# Patient Record
Sex: Female | Born: 1955 | Race: White | Hispanic: No | Marital: Married | Smoking: Current every day smoker
Health system: Southern US, Academic
[De-identification: ages and names within clinical notes are randomized; demographics above are authoritative.]

## PROBLEM LIST (undated history)

## (undated) DIAGNOSIS — L0390 Cellulitis, unspecified: Secondary | ICD-10-CM

## (undated) DIAGNOSIS — I10 Essential (primary) hypertension: Secondary | ICD-10-CM

## (undated) DIAGNOSIS — I509 Heart failure, unspecified: Secondary | ICD-10-CM

## (undated) DIAGNOSIS — E119 Type 2 diabetes mellitus without complications: Secondary | ICD-10-CM

## (undated) DIAGNOSIS — F17200 Nicotine dependence, unspecified, uncomplicated: Secondary | ICD-10-CM

## (undated) DIAGNOSIS — Z9981 Dependence on supplemental oxygen: Secondary | ICD-10-CM

## (undated) DIAGNOSIS — J449 Chronic obstructive pulmonary disease, unspecified: Secondary | ICD-10-CM

## (undated) DIAGNOSIS — M159 Polyosteoarthritis, unspecified: Secondary | ICD-10-CM

## (undated) DIAGNOSIS — Z973 Presence of spectacles and contact lenses: Secondary | ICD-10-CM

## (undated) HISTORY — PX: HX CARPAL TUNNEL RELEASE: SHX101

## (undated) HISTORY — DX: Heart failure, unspecified: I50.9

## (undated) HISTORY — DX: Type 2 diabetes mellitus without complications: E11.9

## (undated) HISTORY — PX: BILATERAL CARPAL TUNNEL RELEASE: SHX6508

## (undated) HISTORY — DX: Cellulitis, unspecified: L03.90

## (undated) HISTORY — DX: Essential (primary) hypertension: I10

---

## 2012-10-08 ENCOUNTER — Encounter (HOSPITAL_BASED_OUTPATIENT_CLINIC_OR_DEPARTMENT_OTHER): Payer: Self-pay

## 2012-10-08 ENCOUNTER — Emergency Department (HOSPITAL_BASED_OUTPATIENT_CLINIC_OR_DEPARTMENT_OTHER): Payer: BLUE CROSS/BLUE SHIELD

## 2012-10-08 ENCOUNTER — Inpatient Hospital Stay (HOSPITAL_BASED_OUTPATIENT_CLINIC_OR_DEPARTMENT_OTHER)
Admission: EM | Admit: 2012-10-08 | Discharge: 2012-10-11 | DRG: 871 | Disposition: A | Discharge status: Home / Self Care | Payer: BLUE CROSS/BLUE SHIELD | Attending: Internal Medicine | Admitting: Internal Medicine

## 2012-10-08 DIAGNOSIS — R6510 Systemic inflammatory response syndrome (SIRS) of non-infectious origin without acute organ dysfunction: Secondary | ICD-10-CM | POA: Diagnosis present

## 2012-10-08 DIAGNOSIS — R0600 Dyspnea, unspecified: Secondary | ICD-10-CM | POA: Diagnosis present

## 2012-10-08 DIAGNOSIS — J441 Chronic obstructive pulmonary disease with (acute) exacerbation: Secondary | ICD-10-CM | POA: Diagnosis present

## 2012-10-08 DIAGNOSIS — Z720 Tobacco use: Secondary | ICD-10-CM | POA: Diagnosis present

## 2012-10-08 DIAGNOSIS — Z886 Allergy status to analgesic agent status: Secondary | ICD-10-CM

## 2012-10-08 DIAGNOSIS — E669 Obesity, unspecified: Secondary | ICD-10-CM | POA: Diagnosis present

## 2012-10-08 DIAGNOSIS — D72829 Elevated white blood cell count, unspecified: Secondary | ICD-10-CM | POA: Diagnosis present

## 2012-10-08 DIAGNOSIS — J9611 Chronic respiratory failure with hypoxia: Secondary | ICD-10-CM | POA: Diagnosis present

## 2012-10-08 DIAGNOSIS — Z8249 Family history of ischemic heart disease and other diseases of the circulatory system: Secondary | ICD-10-CM

## 2012-10-08 DIAGNOSIS — Z836 Family history of other diseases of the respiratory system: Secondary | ICD-10-CM

## 2012-10-08 DIAGNOSIS — E119 Type 2 diabetes mellitus without complications: Secondary | ICD-10-CM | POA: Diagnosis present

## 2012-10-08 DIAGNOSIS — F17200 Nicotine dependence, unspecified, uncomplicated: Secondary | ICD-10-CM | POA: Diagnosis present

## 2012-10-08 DIAGNOSIS — M159 Polyosteoarthritis, unspecified: Secondary | ICD-10-CM | POA: Diagnosis present

## 2012-10-08 DIAGNOSIS — Z6837 Body mass index (BMI) 37.0-37.9, adult: Secondary | ICD-10-CM

## 2012-10-08 DIAGNOSIS — I10 Essential (primary) hypertension: Secondary | ICD-10-CM | POA: Diagnosis present

## 2012-10-08 DIAGNOSIS — A419 Sepsis, unspecified organism: Principal | ICD-10-CM | POA: Diagnosis present

## 2012-10-08 DIAGNOSIS — J9620 Acute and chronic respiratory failure, unspecified whether with hypoxia or hypercapnia: Secondary | ICD-10-CM | POA: Diagnosis present

## 2012-10-08 DIAGNOSIS — Z833 Family history of diabetes mellitus: Secondary | ICD-10-CM

## 2012-10-08 DIAGNOSIS — J209 Acute bronchitis, unspecified: Secondary | ICD-10-CM | POA: Diagnosis present

## 2012-10-08 HISTORY — DX: Chronic obstructive pulmonary disease, unspecified (CMS HCC): J44.9

## 2012-10-08 HISTORY — DX: Type 2 diabetes mellitus without complications (CMS HCC): E11.9

## 2012-10-08 HISTORY — DX: Essential (primary) hypertension: I10

## 2012-10-08 HISTORY — DX: Polyosteoarthritis, unspecified: M15.9

## 2012-10-08 HISTORY — DX: Nicotine dependence, unspecified, uncomplicated: F17.200

## 2012-10-08 LAB — ARTERIAL BLOOD GAS - BMC/JMC ONLY
ARTERIAL BLOOD BASE EXCESS - BMC/JMC ONLY: 2.8 mmol/L (ref <=3.0)
ARTERIAL BLOOD O2 SATURATION - BMC/JMC ONLY: 86.2 % — ABNORMAL LOW (ref 93.0–98.0)
ARTERIAL BLOOD PCO2 - BMC/JMC ONLY: 40.3 mmHg (ref 35.0–45.0)
ARTERIAL BLOOD PH - BMC/JMC ONLY: 7.44 (ref 7.350–7.450)
ARTERIAL BLOOD PO2: 50 mmHg — CL (ref 80.0–100.0)
ARTERIAL BLOOD TOTAL CO2 - CITY ONLY: 28.4 mmol/L (ref 22.0–29.0)
RTERIAL BLOOD HCO3 - BMC/JMC ONLY: 27.2 mmol/L (ref 22.0–28.0)

## 2012-10-08 LAB — COMPREHENSIVE METABOLIC PROFILE - BMC/JMC ONLY
ALBUMIN: 4 g/dL (ref 3.2–5.0)
ALKALINE PHOSPHATASE: 90 IU/L (ref 35–120)
ALT (SGPT): 16 IU/L (ref 0–55)
AST (SGOT): 14 IU/L (ref 0–45)
BILIRUBIN, TOTAL: 0.6 mg/dL (ref 0.0–1.3)
BUN: 3 mg/dL — ABNORMAL LOW (ref 6–22)
CALCIUM: 9.3 mg/dL (ref 8.5–10.5)
CARBON DIOXIDE: 32 mmol/L (ref 22–32)
CHLORIDE: 100 mmol/L — ABNORMAL LOW (ref 101–111)
CREATININE: 0.55 mg/dL (ref 0.53–1.00)
ESTIMATED GLOMERULAR FILTRATION RATE: >60 mL/min (ref >60)
GLUCOSE: 142 mg/dL — ABNORMAL HIGH (ref 70–110)
POTASSIUM: 3.8 mmol/L (ref 3.5–5.0)
SODIUM: 141 mmol/L (ref 136–145)
TOTAL PROTEIN: 7.8 g/dL (ref 6.0–8.0)

## 2012-10-08 LAB — CBC
HCT: 52.7 % — ABNORMAL HIGH (ref 36.0–45.0)
HGB: 16.9 g/dL — ABNORMAL HIGH (ref 12.0–15.5)
MCH: 30.2 pg (ref 28.0–34.0)
MCHC: 32 g/dL — ABNORMAL LOW (ref 33.0–37.0)
MCV: 94.4 fL (ref 82.0–97.0)
MPV: 6.4 fL — ABNORMAL LOW (ref 7.0–9.4)
PLATELET COUNT: 418 K/uL — ABNORMAL HIGH (ref 150–400)
RBC: 5.59 M/uL — ABNORMAL HIGH (ref 4.00–5.10)
RDW: 13.2 % — ABNORMAL HIGH (ref 11.0–13.0)
WBC: 17.3 K/uL — ABNORMAL HIGH (ref 4.0–11.0)

## 2012-10-08 LAB — MANUAL DIFFERENTIAL - BMC/JMC ONLY
ATYPICAL LYMPHOCYTES: 2 % — ABNORMAL HIGH (ref 0–0)
BANDS: 1 % (ref 0–4)
LYMPHOCYTES: 17 % (ref 16–45)
MONOCYTES: 7 % (ref 4–10)
PLATELET ESTIMATE: ADEQUATE
PMN'S: 73 % (ref 45–74)
RBC MORPHOLOGY: NORMAL

## 2012-10-08 LAB — TROPONIN-I: TROPONIN-I: <0.03 ng/mL (ref 0.00–0.06)

## 2012-10-08 LAB — PERFORM POC FINGERSTICK GLUCOSE: BLD GLUCOSE POCT: 293 mg/dL — ABNORMAL HIGH (ref 60–100)

## 2012-10-08 MED ORDER — SITAGLIPTIN PHOSPHATE 100 MG TABLET
100.0000 mg | ORAL_TABLET | Freq: Every day | ORAL | Status: DC
Start: 2012-10-09 — End: 2012-10-11
  Administered 2012-10-09 – 2012-10-11 (×3): 100 mg via ORAL
  Filled 2012-10-08 (×3): qty 1

## 2012-10-08 MED ORDER — DOXYCYCLINE HYCLATE 100 MG INTRAVENOUS POWDER FOR SOLUTION
100.00 mg | Freq: Two times a day (BID) | INTRAVENOUS | Status: DC
Start: 2012-10-08 — End: 2012-10-09
  Administered 2012-10-08 – 2012-10-09 (×2): 100 mg via INTRAVENOUS
  Filled 2012-10-08 (×4): qty 10

## 2012-10-08 MED ORDER — HEPARIN (PORCINE) 5,000 UNIT/ML INJECTION SOLUTION
5000.0000 [IU] | Freq: Three times a day (TID) | INTRAMUSCULAR | Status: DC
Start: 2012-10-08 — End: 2012-10-11
  Filled 2012-10-08 (×9): qty 1

## 2012-10-08 MED ORDER — IPRATROPIUM BROMIDE 0.02 % SOLUTION FOR INHALATION
0.50 mg | RESPIRATORY_TRACT | Status: AC
Start: 2012-10-08 — End: 2012-10-08

## 2012-10-08 MED ORDER — ALBUTEROL SULFATE CONCENTRATE 2.5 MG/0.5 ML SOLUTION FOR NEBULIZATION
2.50 mg | INHALATION_SOLUTION | Freq: Four times a day (QID) | RESPIRATORY_TRACT | Status: DC
Start: 2012-10-08 — End: 2012-10-11
  Administered 2012-10-09 – 2012-10-11 (×12): 2.5 mg via RESPIRATORY_TRACT
  Filled 2012-10-08 (×13): qty 1

## 2012-10-08 MED ORDER — ACETAMINOPHEN 325 MG TABLET
650.0000 mg | ORAL_TABLET | Freq: Four times a day (QID) | ORAL | Status: DC | PRN
Start: 2012-10-08 — End: 2012-10-11

## 2012-10-08 MED ORDER — ALBUTEROL SULFATE CONCENTRATE 2.5 MG/0.5 ML SOLUTION FOR NEBULIZATION
2.50 mg | INHALATION_SOLUTION | RESPIRATORY_TRACT | Status: DC | PRN
Start: 2012-10-08 — End: 2012-10-11
  Administered 2012-10-08 – 2012-10-10 (×2): 2.5 mg via RESPIRATORY_TRACT
  Filled 2012-10-08 (×2): qty 1

## 2012-10-08 MED ORDER — IPRATROPIUM BROMIDE 0.02 % SOLUTION FOR INHALATION
0.50 mg | Freq: Four times a day (QID) | RESPIRATORY_TRACT | Status: DC
Start: 2012-10-08 — End: 2012-10-11
  Administered 2012-10-09 – 2012-10-11 (×12): 0.5 mg via RESPIRATORY_TRACT
  Filled 2012-10-08 (×12): qty 1

## 2012-10-08 MED ORDER — SODIUM CHLORIDE 0.9 % (FLUSH) INJECTION SYRINGE
10.0000 mL | INJECTION | Freq: Three times a day (TID) | INTRAMUSCULAR | Status: DC
Start: 2012-10-08 — End: 2012-10-11

## 2012-10-08 MED ORDER — NITROGLYCERIN 0.4 MG SUBLINGUAL TABLET
0.4000 mg | SUBLINGUAL_TABLET | SUBLINGUAL | Status: DC | PRN
Start: 2012-10-08 — End: 2012-10-11

## 2012-10-08 MED ORDER — ALBUTEROL SULFATE CONCENTRATE 2.5 MG/0.5 ML SOLUTION FOR NEBULIZATION
2.5000 mg | INHALATION_SOLUTION | RESPIRATORY_TRACT | Status: AC
Start: 2012-10-08 — End: 2012-10-08
  Filled 2012-10-08: qty 1

## 2012-10-08 MED ORDER — METHYLPREDNISOLONE SOD SUCC 125 MG SOLUTION FOR INJECTION WRAPPER
125.0000 mg | INTRAVENOUS | Status: AC
Start: 2012-10-08 — End: 2012-10-08
  Filled 2012-10-08: qty 2

## 2012-10-08 MED ORDER — FLUTICASONE-SALMETEROL 500 MCG-50 MCG/DOSE DISK DEVICE - EAST
1.00 | DISK | Freq: Two times a day (BID) | RESPIRATORY_TRACT | Status: DC
Start: 2012-10-08 — End: 2012-10-11
  Administered 2012-10-08 – 2012-10-11 (×6): 1 via RESPIRATORY_TRACT
  Filled 2012-10-08: qty 14

## 2012-10-08 MED ORDER — METHYLPREDNISOLONE SOD SUCCINATE 40 MG/ML SOLUTION FOR INJ. WRAPPER
40.00 mg | Freq: Four times a day (QID) | INTRAMUSCULAR | Status: DC
Start: 2012-10-08 — End: 2012-10-11
  Administered 2012-10-08 – 2012-10-11 (×12): 40 mg via INTRAVENOUS
  Filled 2012-10-08 (×12): qty 1

## 2012-10-08 MED ADMIN — atorvastatin 20 mg tablet: 20 mg | ORAL | NDC 68084009811

## 2012-10-08 MED ADMIN — albuterol sulfate concentrate 2.5 mg/0.5 mL solution for nebulization: 2.5 mg | RESPIRATORY_TRACT | NDC 00487990130

## 2012-10-08 MED ADMIN — acetylcysteine 200 mg/mL (20 %) solution: 5 mL | RESPIRATORY_TRACT | NDC 00409330803

## 2012-10-08 MED ADMIN — ipratropium bromide 0.02 % solution for inhalation: 0.5 mg | RESPIRATORY_TRACT | NDC 00487980101

## 2012-10-08 MED ADMIN — sodium chloride 0.9 % (flush) injection syringe: 10 mL | INTRAVENOUS | NDC 08290306546

## 2012-10-08 MED ADMIN — heparin (porcine) 5,000 unit/mL injection solution: 5000 [IU] | SUBCUTANEOUS | NDC 25021040201

## 2012-10-08 MED ADMIN — methylPREDNISolone sodium succinate 125 mg solution for injection: 125 mg | INTRAVENOUS | NDC 00009004722

## 2012-10-08 MED FILL — atorvastatin 20 mg tablet: 20.0000 mg | ORAL | Qty: 1 | Status: AC

## 2012-10-08 MED FILL — albuterol sulfate concentrate 2.5 mg/0.5 mL solution for nebulization: 2.5000 mg | RESPIRATORY_TRACT | Qty: 1 | Status: AC

## 2012-10-08 MED FILL — acetylcysteine 200 mg/mL (20 %) solution: 5.0000 mL | Qty: 30 | Status: AC

## 2012-10-08 MED FILL — ipratropium bromide 0.02 % solution for inhalation: 0.5000 mg | RESPIRATORY_TRACT | Qty: 1 | Status: AC

## 2012-10-08 NOTE — ED Nurses Note (Signed)
RT stick for second blood culture, labs sent to LAB

## 2012-10-08 NOTE — ED Nurses Note (Signed)
 Cold symptoms with yellow sputum started Friday, SOB started yesterday, normal oxygen  90 % on RA per patient, chills started today. H/a 2 days now. No n/v.

## 2012-10-08 NOTE — H&P (Signed)
[facility]  [place], [place] [address]    General History and Physical    Benjamin, Merrihew  Date of Admission:  10/08/2012  Date of Birth:  31-Jul-1955    PCP: Verline Lema, MD  Chief Complaint:        HPI: KERON KOFFMAN is a 57 y.o., White female who presents with SOB since yesterday reports cough with yellow sputum. Admits to wheeze. Admits to feeling cold. No fevers. No CP. No Nausea/vomiting. Smokes 1.5/day for 20 years. Uses Use albuterol nebs at home. Has used her meds w/o relief. Says it started out as a head cold. 2 days prior and progressively worsen.      Past Medical History   Diagnosis Date   . COPD (chronic obstructive pulmonary disease)    . Diabetes mellitus    . HTN (hypertension)    . Smoker    . Degenerative joint disease involving multiple joints        Past Surgical History   Procedure Laterality Date   . Hx carpal tunnel release         Medications Prior to Admission    Outpatient Medications    atorvastatin (LIPITOR) 20 mg Oral Tablet    Take 20 mg by mouth Every evening    ipratropium-albuterol (COMBIVENT RESPIMAT) 20-100 mcg/actuation Inhalation Aerosol    Take 1 Puff by inhalation Four times a day    Ipratropium-Albuterol (DUONEB) 0.5 mg-3 mg(2.5 mg base)/3 mL Inhalation Solution for Nebulization    3 mL by Nebulization route Once, as needed for Wheezing    levalbuterol HCl (XOPENEX) 0.31 mg/3 mL Inhalation Solution for Nebulization    0.31 mg by Nebulization route Every 4 hours as needed    sitaGLIPtin (JANUVIA) 100 mg Oral Tablet    Take 100 mg by mouth Once a day    tiotropium bromide (SPIRIVA WITH HANDIHALER) 18 mcg Inhalation Capsule, w/Inhalation Device    Take 18 mcg by inhalation Once a day          Current Facility-Administered Medications:  acetaminophen (TYLENOL) tablet 650 mg Oral Q6H PRN   acetylcysteine (MUCOMYST) 20% nebulized solution 5 mL Nebulization 3x/day   albuterol (PROVENTIL) 2.5mg / 0.5 mL nebulizer solution 2.5 mg Nebulization Q4H PRN    And      ipratropium (ATROVENT) 0.02% nebulizer solution 0.5 mg Nebulization Q4H PRN   albuterol (PROVENTIL) 2.5mg / 0.5 mL nebulizer solution 2.5 mg Nebulization 4x/day   And      ipratropium (ATROVENT) 0.02% nebulizer solution 0.5 mg Nebulization 4x/day   atorvastatin (LIPITOR) tablet 20 mg Oral QPM   doxycycline hyclate 100 mg in D5W 100 mL IVPB 100 mg Intravenous Q12H   fluticasone-salmeterol (ADVAIR) 500 mcg-50 mcg per inhalation oral inhaler 1 INHALATION Inhalation 2x/day   heparin 5,000 unit/mL injection 5,000 Units Subcutaneous Q8HRS   methylPREDNISolone sod succ (SOLU-MEDROL) 40 mg/mL injection 40 mg Intravenous Q6H   nitroglycerin (NITROSTAT) sublingual tablet 0.4 mg Sublingual Q5 Min PRN   NS flush syringe 10 mL Intravenous Q8HRS   [START ON 10/09/2012] sitaGLIPtin (JANUVIA) tablet 100 mg Oral Daily       Allergies   Allergen Reactions   . Codeine  Other Adverse Reaction (Add comment)     Sick to stomach   . Hydrocodone Nausea/ Vomiting   . Metformin Diarrhea   . Oxycodone Nausea/ Vomiting       History   Substance Use Topics   . Smoking status: Current Every Day Smoker -- 1.50  packs/day for 20 years     Types: Cigarettes   . Smokeless tobacco: Not on file   . Alcohol Use: No       Family History   Problem Relation Age of Onset   . Diabetes Mother    . COPD Mother    . Coronary Artery Disease Father    . Diabetes Sister    . Diabetes Brother    . Diabetes Paternal Grandmother        ROS:   Constitutional: negative  Eyes: negative  Ears, nose, mouth, throat, and face: negative  Respiratory: positive for cough, sputum, wheezing or dyspnea on exertion  Cardiovascular: negative  Gastrointestinal: negative  Genitourinary:negative  Integument/breast: negative  Hematologic/lymphatic: negative  Musculoskeletal:negative  Neurological: negative  Behavioral/Psych: negative  Endocrine: negative  Allergic/Immunologic: negative    10 systems are reviewed.    DNR Status:  Full Code    EXAM:   Temperature: 36.8 C (98.3 F)  Heart Rate: 116  BP (Non-Invasive): 199/84 mmHg  Respiratory Rate: 31  SpO2-1: 96 %  Pain Score (Numeric, Faces): 7  General: AAOx3. Obese. No acute distress.   Eyes: Pupils equal and round, reactive to light and accomodation.   HEENT: Head atraumatic and normocephalic   Neck: No JVD or thyromegaly or lymphadenopathy   Lungs: Crackles noted on auscultation bilaterally. Minimal expiratory wheeze. Poor air entry BL.  Cardiovascular: regular rate and rhythm, S1, S2 normal, no murmur  Abdomen: Obese. Soft, non-tender, Bowel sounds normal, No hepatosplenomegaly   Extremities: extremities normal, atraumatic, no cyanosis or edema   Skin: Skin warm and dry   Neurologic: Grossly normal   Lymphatics: No lymphadenopathy   Psychiatric: Normal affect, behavior,         Labs:    Lab Results for Last 24 Hours:    Results for orders placed during the hospital encounter of 10/08/12 (from the past 24 hour(s))   CBC       Result Value Range    WBC 17.3 (*) 4.0 - 11.0 K/uL    RBC 5.59 (*) 4.00 - 5.10 M/uL    HGB 16.9 (*) 12.0 - 15.5 g/dL    HCT 46.9 (*) 62.9 - 45.0 %    MCV 94.4  82.0 - 97.0 fL    MCH 30.2  28.0 - 34.0 pg    MCHC 32.0 (*) 33.0 - 37.0 g/dL    RDW 52.8 (*) 41.3 - 13.0 %    PLATELET COUNT 418 (*) 150 - 400 K/uL    MPV 6.4 (*) 7.0 - 9.4 fL   COMPREHENSIVE METABOLIC PROFILE - [facility] ONLY       Result Value Range    GLUCOSE 142 (*) 70 - 110 mg/dL    BUN 3 (*) 6 - 22 mg/dL    CREATININE 2.44  0.10 - 1.00 mg/dL    ESTIMATED GLOMERULAR FILTRATION RATE >60  >60 ml/min    SODIUM 141  136 - 145 mmol/L    POTASSIUM 3.8  3.5 - 5.0 mmol/L    CHLORIDE 100 (*) 101 - 111 mmol/L    CARBON DIOXIDE 32  22 - 32 mmol/L    CALCIUM 9.3  8.5 - 10.5 mg/dL    TOTAL PROTEIN 7.8  6.0 - 8.0 g/dL    ALBUMIN 4.0  3.2 - 5.0 g/dL    BILIRUBIN, TOTAL 0.6  0.0 - 1.3 mg/dL    AST (SGOT) 14  0 - 45 IU/L    ALT (SGPT) 16  0 - 55 IU/L    ALKALINE PHOSPHATASE 90  35 - 120 IU/L   TROPONIN-I       Result Value Range     TROPONIN-I <0.03  0.00 - 0.06 ng/mL   MANUAL DIFFERENTIAL - [facility] ONLY       Result Value Range    BANDS 1  0 - 4 %    PMN'S 73  45 - 74 %    LYMPHOCYTES 17  16 - 45 %    AYTPICAL LYMPHOCYTES 2 (*) 0 - 0 %    MONOCYTES 7  4 - 10 %    PLATELET ESTIMATE ADEQUATE  ADEQUATE    RBC MORPHOLOGY NORMAL  NORMAL   ARTERIAL BLOOD GAS - [facility] ONLY       Result Value Range    ARTERIAL BLOOD PH - [facility] ONLY 7.44  7.350 - 7.450    ARTERIAL BLOOD PCO2 - [facility] ONLY 40.3  35.0 - 45.0 mmHg    ARTERIAL BLOOD PO2 - [facility] ONLY 50.0 (*) 80.0 - 100.0 mmHg    RTERIAL BLOOD HCO3 - [facility] ONLY 27.2  22.0 - 28.0 mmol/L    ARTERIAL BLOOD TOTAL CO2 - [facility] ONLY 28.4  22.0 - 29.0 mmol/L    ARTERIAL BLOOD BASE EXCESS - [facility] ONLY 2.8  -2.0 - 3.0 mmol/L    ARTERIAL BLOOD O2 SATURATION - [facility] ONLY 86.2 (*) 93.0 - 98.0 %    SAMPLE TYPE - [facility] ONLY Arterial  ()    DRAW SITE - [facility] ONLY R Brach  ()    ALLEN'S TEST - [facility] ONLY N/A  ()    DELIVERY SYSTEM - [facility] ONLY Room Air  ()    CRITICAL NOTIFY - [facility] ONLY DR. GLASS  ()    READ BACK - [facility] ONLY Yes  ()    OTIFY DATE - [facility] ONLY 29Sep2014 18:53  ()       Imaging Studies:    CXR  ? R sided infiltrate. Barrel chest anatomy    DVT RISK FACTORS HAVE BEN ASSESSED AND PROPHYLAXIS ORDERED (SEE RUBYONLINE - REFERENCE TOOLS - MD, DVT PROPHY OR POCKET CARD)  Patient Active Problem List    Diagnosis Date Noted   . Dyspnea 10/08/2012   . COPD exacerbation 10/08/2012   . Obesity (BMI 35.0-39.9 without comorbidity) 10/08/2012     Assessment/Plan:       Dyspnea: Acute. Likely due to COPD exa.  PNA needs r/o given elevated WBC. Will start on Doxycycline. Bronchodilators. Solumedrol . Will get sputum culture. Procalcitonin. Will repeat CXR in AM.      COPD exa: Moderate. Treat as outlined above. Oxygen. Monitor. Advised smoking cessation.      Tobacco abuse: Chronic. Talked about abstinence and medication available to help her. Declined meds for now. Spent 12 minutes on talk.     PNA:? Unlikely but elevated WBC is concerning. No real clear infiltrate on CXR. Procalcitonin. Sputum culture. Repeat CXR in M. Monitor.      Obesity: Class II. Needs weight loss. May benefit from a sleep study as outpt.      Proph: PPI. Heparin.      Dispo: Home in 2-3 days.      Code: FC (Pt has capacity).

## 2012-10-08 NOTE — ED Provider Notes (Signed)
 [name]  Emergency Department Visit Note    Date:  10/08/2012  Primary care provider:  Delon Ken, MD  Means of arrival:  private car  History obtained from: patient  History limited by: none    Chief Complaint:  Shortness of breath    HISTORY OF PRESENT ILLNESS     [name], date of birth 1955-06-24, is a 57 y.o. female with a history of COPD who presents to the Emergency Department complaining of shortness of breath that began yesterday and worsened today. The patient reports that she had cold-like symptoms three days ago, including sore throat and sinus congestion, but this has since resolved. The patient states that she used a nebulizer treatment which helped briefly. Patient confirms cough (producing yellow phlegm) and denies hemoptysis, fever, and any pain currently.     Additionally, she states that she has never been admitted and has never needed to be intubated.     REVIEW OF SYSTEMS     The pertinent positive and negative symptoms are as per HPI. All other systems reviewed and are negative.     PATIENT HISTORY     Past Medical History:  Past Medical History   Diagnosis Date   . COPD (chronic obstructive pulmonary disease)    . Diabetes mellitus    . HTN (hypertension)      Past Surgical History:  History reviewed. No pertinent past surgical history.    Social History:  History   Substance Use Topics   . Smoking status: Current Every Day Smoker -- 1.50 packs/day     Types: Cigarettes   . Alcohol Use: No     History   Drug Use No     Medications:  Previous Medications    No medications on file     Allergies:  Allergies   Allergen Reactions   . Codeine  Other Adverse Reaction (Add comment)     Sick to stomach   . Hydrocodone Nausea/ Vomiting   . Metformin Diarrhea   . Oxycodone  Nausea/ Vomiting     PHYSICAL EXAM     Vitals:  Filed Vitals:    10/08/12 1820   BP: 150/91   Pulse: 110   Temp: 36.8 C (98.3 F)   Resp: 24   SpO2: 85%     Pulse ox  93% on Non Rebreather Mask  interpreted by me as: Improved after treatment    General: Awake. Alert. No acute distress.  Head:  Atraumatic     Eyes: Anicteric sclera, noninjected conjunctiva.  PERRL. EOMI.  ENT: Oropharynx patent, pink, moist, no erythema, exudates or asymmetry. No stridor.   Neck: Neck is supple, nontender, no adenopathy. No nuchal rigidity. No JVD.  Lungs: Clear to auscultation bilaterally. Bilateral expiratory wheezes. No rales or rhonchi.  Mild respiratory distress.  Cardiovascular:  Heart is regular without murmurs rubs or gallops   Abdomen:  Soft, nontender, normoactive bowel sounds. No peritoneal signs. No pulsatile mass.  Extremities:  No cyanosis, edema, tenderness or asymmetry.  Skin: Skin warm, dry and well-perfused. No petechia or erythema.   Back:  Non-tender to palpation in the midline.    Neurologic: Awake, alert, no lethargy, somnolence or confusion. Moves upper and lower extremities without focal deficits.   Psychiatric: Answers questions appropriately.    DIAGNOSTIC STUDIES     Labs:    Results for orders placed during the hospital encounter of 10/08/12   CBC  Result Value Range    WBC 17.3 (*) 4.0 - 11.0 K/uL    RBC 5.59 (*) 4.00 - 5.10 M/uL    HGB 16.9 (*) 12.0 - 15.5 g/dL    HCT 47.2 (*) 63.9 - 45.0 %    MCV 94.4  82.0 - 97.0 fL    MCH 30.2  28.0 - 34.0 pg    MCHC 32.0 (*) 33.0 - 37.0 g/dL    RDW 86.7 (*) 88.9 - 13.0 %    PLATELET COUNT 418 (*) 150 - 400 K/uL    MPV 6.4 (*) 7.0 - 9.4 fL    PMN % 72.4  43.0 - 76.0 %    LYMPHOCYTE % 17.2  15.0 - 43.0 %    MONOCYTE % 8.4  4.8 - 12.0 %    EOSINOPHIL % 1.1  0.0 - 5.2 %    BASOPHILS % 1.0  0.0 - 1.4 %    PMN # 12.50 (*) 1.50 - 6.50 K/uL    LYMPHOCYTE # 2.97  0.70 - 3.20 K/uL    MONOCYTE # 1.44 (*) 0.20 - 0.90 K/uL    EOSINOPHIL # 0.19  0.00 - 0.50 K/uL    BASOPHIL # 0.18 (*) 0.00 - 0.10 K/uL   COMPREHENSIVE METABOLIC PROFILE - [facility] ONLY       Result Value Range    GLUCOSE 142 (*) 70 - 110 mg/dL    BUN 3 (*) 6 - 22 mg/dL    CREATININE 9.44  9.46 - 1.00  mg/dL    ESTIMATED GLOMERULAR FILTRATION RATE >60  >60 ml/min    SODIUM 141  136 - 145 mmol/L    POTASSIUM 3.8  3.5 - 5.0 mmol/L    CHLORIDE 100 (*) 101 - 111 mmol/L    CARBON DIOXIDE 32  22 - 32 mmol/L    CALCIUM 9.3  8.5 - 10.5 mg/dL    TOTAL PROTEIN 7.8  6.0 - 8.0 g/dL    ALBUMIN 4.0  3.2 - 5.0 g/dL    BILIRUBIN, TOTAL 0.6  0.0 - 1.3 mg/dL    AST (SGOT) 14  0 - 45 IU/L    ALT (SGPT) 16  0 - 55 IU/L    ALKALINE PHOSPHATASE 90  35 - 120 IU/L   TROPONIN-I       Result Value Range    TROPONIN-I <0.03  0.00 - 0.06 ng/mL   ARTERIAL BLOOD GAS - [facility] ONLY       Result Value Range    ARTERIAL BLOOD PH - [facility] ONLY 7.44  7.350 - 7.450    ARTERIAL BLOOD PCO2 - [facility] ONLY 40.3  35.0 - 45.0 mmHg    ARTERIAL BLOOD PO2 - [facility] ONLY 50.0 (*) 80.0 - 100.0 mmHg    RTERIAL BLOOD HCO3 - [facility] ONLY 27.2  22.0 - 28.0 mmol/L    ARTERIAL BLOOD TOTAL CO2 - [facility] ONLY 28.4  22.0 - 29.0 mmol/L    ARTERIAL BLOOD BASE EXCESS - [facility] ONLY 2.8  -2.0 - 3.0 mmol/L    ARTERIAL BLOOD O2 SATURATION - [facility] ONLY 86.2 (*) 93.0 - 98.0 %    SAMPLE TYPE - [facility] ONLY Arterial  ()    DRAW SITE - [facility] ONLY R Brach  ()    ALLEN'S TEST - [facility] ONLY N/A  ()    DELIVERY SYSTEM - [facility] ONLY Room Air  ()    CRITICAL NOTIFY - [facility] ONLY DR. Gabryelle Whitmoyer  ()    READ BACK - [facility]  ONLY Yes  ()    OTIFY DATE - [facility] ONLY 29Sep2014 18:53  ()     Labs reviewed and interpreted by me.    Radiology:    XR CHEST AP PORTABLE - No acute findings. COPD. Radiological imaging interpreted by radiologist and independently reviewed by me.    EKG:  12 lead EKG interpreted by me shows sinus tachycardia, rate of 113 bpm, no acute ischemic changes.     ED PROGRESS NOTE / MEDICAL DECISION MAKING     Orders Placed This Encounter   . BLOOD CULTURE - [facility] ONLY   . XR CHEST AP PORTABLE   . ARTERIAL BLOOD GAS - [facility] ONLY   . CBC   . COMPREHENSIVE METABOLIC PROFILE - [facility] ONLY   . TROPONIN-I   . ARTERIAL BLOOD GAS - [facility] ONLY   . RESPIRATORY THERAPY TO DO ARTERIAL PUNCTURE FOR LAB   . ECG 12-LEAD   . INSERT &  MAINTAIN PERIPHERAL IV ACCESS   . albuterol  (PROVENTIL ) 2.5mg / 0.5 mL nebulizer solution    And   . ipratropium (ATROVENT ) 0.02% nebulizer solution   . albuterol  (PROVENTIL ) 2.5mg / 0.5 mL nebulizer solution   . methylPREDNISolone  sod succ (SOLU-MEDROL ) 125 mg/2 mL injection     Patient was initially treated with Proventil  and Atrovent  via nebulizer and IV Solu-Medrol .  Portable chest x-ray, EKG, blood culture, and the listed laboratory studies were ordered.    7:42 PM I discussed the patient's case and above findings with Dr. Darcy [facility]) who has agreed to observe the patient in the hospital for further evaluation and treatment of her symptoms.     7:49 PM - On recheck, I have explained the results of the diagnostic studies.  I have discussed the diagnoses and disposition with the patient. She understood and is in accordance with the treatment plan at this time. All of her questions have been answered to her satisfaction. The patient is in fair condition that is improved at the time of hospitalization.     Pre-Disposition Vitals:  Filed Vitals:    10/08/12 1858 10/08/12 1900 10/08/12 1915 10/08/12 1930   BP:  189/79 184/78 184/78   Pulse:  110 115 110   Temp:       Resp:  28 22 32   SpO2: 96% 96% 100% 100%     CLINICAL IMPRESSION     1. Acute COPD exacerbation  2. Hypoxia    Blood culture pending at the time of admission.    DISPOSITION/PLAN     Admitted        Condition at Disposition: Fair  - Improved    SCRIBE ATTESTATION STATEMENT  I Charlie Eagles, SCRIBE scribed for Melinda Locus, DO on 10/08/2012 at 6:19 PM.     Documentation assistance provided for Melinda Locus, DO  by Charlie Eagles, SCRIBE. Information recorded by the scribe was done at my direction and has been reviewed and validated by me Melinda Locus, DO.

## 2012-10-08 NOTE — Respiratory Therapy (Signed)
Patient placed on 50% Venti mask.    Elza Rafter, RT

## 2012-10-08 NOTE — ED Nurses Note (Signed)
Called and gave report to 6th floor RN.

## 2012-10-08 NOTE — ED Nurses Note (Signed)
EKG done and shown to Dr. Georgina Quint. Dr. Tamsen Snider at bedside evaluating for admission.

## 2012-10-08 NOTE — Respiratory Therapy (Signed)
Second albuterol neb completed.     Elza Rafter, RT

## 2012-10-09 LAB — HGA1C (HEMOGLOBIN A1C WITH EST AVG GLUCOSE)
ESTIMATED AVERAGE GLUCOSE: 177 mg/dL — ABNORMAL HIGH (ref 70–110)
GLYCOHEMOGLOBIN: 7.8 % — ABNORMAL HIGH (ref 4.0–6.0)

## 2012-10-09 LAB — PERFORM POC FINGERSTICK GLUCOSE
BLD GLUCOSE POCT: 292 mg/dL — ABNORMAL HIGH (ref 60–100)
BLD GLUCOSE POCT: 468 mg/dL (ref 60–100)
BLD GLUCOSE POCT: 347 mg/dL — ABNORMAL HIGH (ref 60–100)
BLD GLUCOSE POCT: 352 mg/dL — ABNORMAL HIGH (ref 60–100)

## 2012-10-09 MED ORDER — SODIUM CHLORIDE 0.9 % INTRAVENOUS SOLUTION
1.00 g | INTRAVENOUS | Status: DC
Start: 2012-10-09 — End: 2012-10-11
  Administered 2012-10-09 – 2012-10-11 (×3): 1 g via INTRAVENOUS
  Filled 2012-10-09 (×4): qty 10

## 2012-10-09 MED ADMIN — heparin (porcine) 5,000 unit/mL injection solution: 5000 [IU] | SUBCUTANEOUS | NDC 25021040201

## 2012-10-09 MED ADMIN — azithromycin 250 mg tablet: 500 mg | ORAL | NDC 68084065611

## 2012-10-09 MED ADMIN — acetylcysteine 200 mg/mL (20 %) solution: 5 mL | RESPIRATORY_TRACT | NDC 00409330803

## 2012-10-09 MED ADMIN — sodium chloride 0.9 % (flush) injection syringe: 10 mL | INTRAVENOUS | NDC 08290306546

## 2012-10-09 MED ADMIN — atorvastatin 20 mg tablet: 20 mg | ORAL | NDC 68084009811

## 2012-10-09 MED ADMIN — acetylcysteine 200 mg/mL (20 %) solution: 0 mL | RESPIRATORY_TRACT

## 2012-10-09 MED ADMIN — sodium chloride 0.9 % (flush) injection syringe: 0 mL | INTRAVENOUS

## 2012-10-09 MED FILL — azithromycin 250 mg tablet: 500.0000 mg | ORAL | Qty: 2 | Status: AC

## 2012-10-09 NOTE — Care Plan (Signed)
Problem: General Plan of Care(Adult,OB)  Goal: Plan of Care Review(Adult,OB)  The patient and/or their representative will communicate an understanding of their plan of care   Outcome: Adequate for Discharge Date Met:  10/09/12  Assessment Patient is demonstrating a level of mobility consistent with the patient's normal level of function prior to admission. No skilled PT needs identified during evaluation this date. Will DC PT. Pt should be able to return home when cleared by MD.       Goals:    Deferred as pt has no skilled PT needs    Patient and/orFamily Goals/Expectations: "To be able to go home soon and without oxygen"    Plan:       Pt seen for eval only, no PT needs. Will DC PT.     PT Recommendations for Nursing: encourage ambulation  PT Recommendations for Patient/Family: continue ambulating to maintain strength/mobility    Additional information:     Anticipated rehab needs at discharge:  None     Patient/family has given verbal consent for eval and treatment:  Yes    Is the patient appropriate for skilled PT at this time? no     Posey Pronto, PT

## 2012-10-09 NOTE — Care Management Notes (Signed)
MRS. Chandran'S HOME NEB IS THRU A MAIL ORDER COMPANY. ALBUTEROL THRU THEM AS WELL. PT. AND HUSBAND RECENTLY MOVED HERE FROM [place]. PCP IS [facility] IN [place]. HUSBAND HOLDS THE INS. COVERAGE. GRANDDAUGHTER WAS VISITING AT THE BEDSIDE. PT. UNDERSTANDS IF HOME O2 NECESSARY, CM WILL MEET WITH HER AGAIN.

## 2012-10-09 NOTE — Care Plan (Signed)
Problem: General Plan of Care(Adult,OB)  Goal: Plan of Care Review(Adult,OB)  The patient and/or their representative will communicate an understanding of their plan of care   Outcome: Ongoing (see interventions/notes)  Patients respirations are unlabored is on Venti Mask, now decreased to 40% sating 91-92% with no complaints.  Lungs exp and Ins wheezes throughout. Voids clear yellow urine without difficulty. Pt is on telemetry # 11 Sinus Tach 120's     Abscent of falls/traums/ or injurys, no hospital problems displayed at this time. Will continue to monitor patient.

## 2012-10-09 NOTE — CD[facility] REVIEW (Signed)
 [facility] - Initial Review     Working DRG 1:  190  Dx:  COPD Exac [id number], Dyspnea 21390, Obesity 27800, Tobacco 3051, PNA 486, DM 25000, DJD 71500

## 2012-10-09 NOTE — Nurses Notes (Signed)
Hospitalist paged x2 regarding elevated blood sugars. Waiting for return call. Operator states it may take awhile for doctor to return call.

## 2012-10-09 NOTE — Care Management Notes (Signed)
 10/09/12 1658   Assessment Detail   Assessment Type Admission   Date of Care Management Update 10/10/12   Care Management  Plan   Discharge Planning Status plan in progress   Anticipated Discharge Disposition home   Discharge Needs Assessment   Concerns to be Addressed no discharge needs identified  (possibly will need home o2)   Equipment Needed After Discharge other (see comments)  (o2 possibly)   Referral Information   Admission Type inpatient   Arrived From emergency department   Record Reviewed medical record   Current Health   Services Anticipated at Discharge (as above)

## 2012-10-09 NOTE — Progress Notes (Signed)
 [facility]  [place], [place] [address]    IP PROGRESS NOTE      [name], [name]  Date of Admission:  10/08/2012  Date of Birth:  11-07-1955  Date of Service:  10/09/2012    Chief Complaint: Patient was admitted with COPD exacerbation and profound hypoxia.    Subjective: ROS: Patient is off of non rebreather mask. On O2 by nasal cannula. She is telling me that she does not want to be in the hospital. Denies any CP.     Vital Signs:  Temp (24hrs) Max:37.1 C (98.7 F)      Temperature: 36.7 C (98.1 F)  BP (Non-Invasive): 157/81 mmHg  Heart Rate: 102  Respiratory Rate: 22  Pain Score (Numeric, Faces): 0  SpO2-1: 95 %    Current Medications:    Current Facility-Administered Medications:  acetaminophen  (TYLENOL ) tablet 650 mg Oral Q6H PRN   acetylcysteine  (MUCOMYST ) 20% nebulized solution 5 mL Nebulization 3x/day   albuterol  (PROVENTIL ) 2.5mg / 0.5 mL nebulizer solution 2.5 mg Nebulization Q4H PRN   And      ipratropium (ATROVENT ) 0.02% nebulizer solution 0.5 mg Nebulization Q4H PRN   albuterol  (PROVENTIL ) 2.5mg / 0.5 mL nebulizer solution 2.5 mg Nebulization 4x/day   And      ipratropium (ATROVENT ) 0.02% nebulizer solution 0.5 mg Nebulization 4x/day   atorvastatin  (LIPITOR) tablet 20 mg Oral QPM   azithromycin  (ZITHROMAX ) tablet 500 mg Oral Q24H   cefTRIAXone  (ROCEPHIN ) 1 g in NS 50 mL IVPB 1 g Intravenous Q24H   fluticasone -salmeterol (ADVAIR ) 500 mcg-50 mcg per inhalation oral inhaler 1 INHALATION Inhalation 2x/day   heparin  5,000 unit/mL injection 5,000 Units Subcutaneous Q8HRS   methylPREDNISolone  sod succ (SOLU-MEDROL ) 40 mg/mL injection 40 mg Intravenous Q6H   nitroglycerin  (NITROSTAT ) sublingual tablet 0.4 mg Sublingual Q5 Min PRN   NS flush syringe 10 mL Intravenous Q8HRS   sitaGLIPtin  (JANUVIA ) tablet 100 mg Oral Daily       Today's Physical Exam:  GENERAL: Patient is alert, awake and oriented X 3. In no cardio respiratory distress. She is cyanotic and is short of breath in moderate  distress. She is moderately obese.   HEENT: PERRLA, EOMI. She has poor dentition.   NECK: Supple, NO JVD.   HEART: S1+, S2+, rate controlled and rhythm regular. No murmurs appreciated.   LUNGS: Bilateral breath sounds coarse with expiratory wheezing.   ABDOMEN: Soft, NT, ND with NABS.   EXTREMITIES: No edema, pedal pulses palpable.   NEURO:  Motor exam is non focal.   SKIN: No rashes or bruises.   PSYCH: Pleasant with no signs of depression.      I/O:  I/O last 24 hours:    Intake/Output Summary (Last 24 hours) at 10/09/12 1836  Last data filed at 10/09/12 1440   Gross per 24 hour   Intake   1000 ml   Output   3000 ml   Net  -2000 ml     I/O current shift:       Nutrition/Residuals:       Labs  -  reviewed  Reviewed:   Lab Results for Last 24 Hours:    Results for orders placed during the hospital encounter of 10/08/12 (from the past 24 hour(s))   CBC       Result Value Range    WBC 17.3 (*) 4.0 - 11.0 K/uL    RBC 5.59 (*) 4.00 - 5.10 M/uL    HGB 16.9 (*) 12.0 - 15.5 g/dL  HCT 52.7 (*) 36.0 - 45.0 %    MCV 94.4  82.0 - 97.0 fL    MCH 30.2  28.0 - 34.0 pg    MCHC 32.0 (*) 33.0 - 37.0 g/dL    RDW 86.7 (*) 88.9 - 13.0 %    PLATELET COUNT 418 (*) 150 - 400 K/uL    MPV 6.4 (*) 7.0 - 9.4 fL   COMPREHENSIVE METABOLIC PROFILE - [facility] ONLY       Result Value Range    GLUCOSE 142 (*) 70 - 110 mg/dL    BUN 3 (*) 6 - 22 mg/dL    CREATININE 9.44  9.46 - 1.00 mg/dL    ESTIMATED GLOMERULAR FILTRATION RATE >60  >60 ml/min    SODIUM 141  136 - 145 mmol/L    POTASSIUM 3.8  3.5 - 5.0 mmol/L    CHLORIDE 100 (*) 101 - 111 mmol/L    CARBON DIOXIDE 32  22 - 32 mmol/L    CALCIUM 9.3  8.5 - 10.5 mg/dL    TOTAL PROTEIN 7.8  6.0 - 8.0 g/dL    ALBUMIN 4.0  3.2 - 5.0 g/dL    BILIRUBIN, TOTAL 0.6  0.0 - 1.3 mg/dL    AST (SGOT) 14  0 - 45 IU/L    ALT (SGPT) 16  0 - 55 IU/L    ALKALINE PHOSPHATASE 90  35 - 120 IU/L   TROPONIN-I       Result Value Range    TROPONIN-I <0.03  0.00 - 0.06 ng/mL   MANUAL DIFFERENTIAL - [facility] ONLY       Result  Value Range    BANDS 1  0 - 4 %    PMN'S 73  45 - 74 %    LYMPHOCYTES 17  16 - 45 %    AYTPICAL LYMPHOCYTES 2 (*) 0 - 0 %    MONOCYTES 7  4 - 10 %    PLATELET ESTIMATE ADEQUATE  ADEQUATE    RBC MORPHOLOGY NORMAL  NORMAL   BLOOD CULTURE - [facility] ONLY       Result Value Range    BLOOD CULTURE NO GROWTH AFTER 16 HOURS INCUBATION.     HGA1C (HEMOGLOBIN A1C WITH EST AVG GLUCOSE)       Result Value Range    GLYCOHEMOGLOBIN 7.8 (*) 4.0 - 6.0 %    ESTIMATED AVERAGE GLUCOSE 177 (*) 70 - 110 mg/dL   ARTERIAL BLOOD GAS - [facility] ONLY       Result Value Range    ARTERIAL BLOOD PH - [facility] ONLY 7.44  7.350 - 7.450    ARTERIAL BLOOD PCO2 - [facility] ONLY 40.3  35.0 - 45.0 mmHg    ARTERIAL BLOOD PO2 - [facility] ONLY 50.0 (*) 80.0 - 100.0 mmHg    RTERIAL BLOOD HCO3 - [facility] ONLY 27.2  22.0 - 28.0 mmol/L    ARTERIAL BLOOD TOTAL CO2 - [facility] ONLY 28.4  22.0 - 29.0 mmol/L    ARTERIAL BLOOD BASE EXCESS - [facility] ONLY 2.8  -2.0 - 3.0 mmol/L    ARTERIAL BLOOD O2 SATURATION - [facility] ONLY 86.2 (*) 93.0 - 98.0 %    SAMPLE TYPE - [facility] ONLY Arterial  ()    DRAW SITE - [facility] ONLY R Brach  ()    ALLEN'S TEST - [facility] ONLY N/A  ()    DELIVERY SYSTEM - [facility] ONLY Room Air  ()    CRITICAL NOTIFY - [facility] ONLY DR. GLASS  ()  READ BACK - [facility] ONLY Yes  ()    OTIFY DATE - [facility] ONLY 29Sep2014 18:53  ()   POCT FINGERSTICK GLUCOSE       Result Value Range    BLD GLUCOSE POCT 293 (*) 60 - 100 mg/dL   POCT FINGERSTICK GLUCOSE       Result Value Range    BLD GLUCOSE POCT 292 (*) 60 - 100 mg/dL   POCT FINGERSTICK GLUCOSE       Result Value Range    BLD GLUCOSE POCT 468 (*) 60 - 100 mg/dL   POCT FINGERSTICK GLUCOSE       Result Value Range    BLD GLUCOSE POCT 347 (*) 60 - 100 mg/dL     Ordered:      Diagnostic Tests - reviewed    Radiology Tests - reviewed    Problem List:  Active Hospital Problems   (*Primary Problem)    Diagnosis   . *Dyspnea   . COPD exacerbation   . Obesity (BMI 35.0-39.9 without comorbidity)       Assessment/ Plan:     Acute respiratory failure with hypoxic  respiratory failure - on Non rebreather mask initially - wean to O2 by nasal cannula.  COPD exacerbation / acute -  Pneumonia needs to be ruled out as she is having elevated WBC. Change IV antibiotics to Ceftriaxone  and Zithromax . Continue Bronchodilator nebs and IV Solumedrol.  Obtain sputum culture.   Long standing Tobacco abuse - D/W her at length regarding smoking cessation, Nicotine  patch offered.   Leucocytosis with SIRS / Sepsis - obtain Sputum culture. Monitor clinically.   Obesity with a BMI of 37 - advised her to lose weight - May benefit from a sleep study as outpatient.   PLAN:  On Heparin  for DVT prophylaxis.  Follow serial labs.  Advised patient to stay and get treated and not leave AMA. I explained to her that she was hypoxic and will need to get better before she could go home, that if she leaves that she will get worse.  She agreed to stay.   Patient was updated on plan of care.

## 2012-10-09 NOTE — PT Evaluation (Signed)
[facility]  [place], [place] [address]  Rehabilitation Services  Physical Therapy Initial Evaluation        Patient Name: [name]  Date of Birth: [birth year]  Height: 160 cm (5\' 3" )  Weight: 94.2 kg (207 lb 10.8 oz)  Room/Bed: 633/B  Payor: BLUE [employer]OSS BLUE [employer]IELD / Plan: [place]/BS [facility] / Product Type: Non Managed Care /     Date/Time of Admission: 10/08/2012  6:02 PM  Admitting Diagnosis:  There are no admission diagnoses documented for this encounter.    EKATERINI CAPITANO is a 57 y.o., female, admitted w/ COPD exacerbation    Past Medical Hx:    Past Medical History   Diagnosis Date   . COPD (chronic obstructive pulmonary disease)    . Diabetes mellitus    . HTN (hypertension)    . Smoker    . Degenerative joint disease involving multiple joints      Past Surgical Hx:    Past Surgical History   Procedure Laterality Date   . Hx carpal tunnel release       Past Social Hx:       History     Social History Narrative   . No narrative on file         Patient Information     Precautions:  Pt SOB w/ activity, on 5L o2     Barriers to learning:  None noted       Prior level of function     Usual living arrangement:With family     Type of dwelling:  Two story home     Physical barriers in the home environment:  flight of stairs to bedroom     Previously independent with:  Mobility/ambulation     Assistive devices used at home:  none     Comments:  Pt lives with several family members in a two story home w/ 2-3 steps to enter. Pt was independent w/ mobility and self care prior to admission. Pt is not on o2 at home.     Subjective: "I can walk just fine, but I can only walk so far because I get short of breath."    Objective:     Cognition:  Person, Place and Time     Communication ability:  good       Range of Motion     RUE ROM WNL?  yes     LUE ROM WNL?  yes     RLE ROM WNL?  yes     LLE ROM WNL?  yes    Strength     RUE strength WNL?  Grossly 4/5     LUE strength WNL? Grossly 4/5      RLE strength WNL? Grossly 4/5     LLE strength WNL?  Grossly 4/5          Sensation:  Intact    Functional Ability     Supine - Sit:  Independent     Sit - Supine:  Independent     Sitting balance:  Good      Sit - stand:  Standby assist     Stand balance:  Good      Bed - Chair/ Chair - bed:  Standby assist     Comments:  Pt able to complete transfers unassisted w/o device. No LOB noted.     Ambulation     Ambulation surface:  In hallway     Ambulation  ability:  Standby assist     Assistive devices:  Gait belt     Ambulation tolerance:  Good     Comments: Pt ambulating in hall approx 120 ft w/o device and stand by assist. No major LOB noted. Pt on 6L o2 during gait. (Pt on 5L at bedside, but portable tank only allows 4 or 6 liters).     Assessment Patient is demonstrating a level of mobility consistent with the patient's normal level of function prior to admission. No skilled PT needs identified during evaluation this date. Will DC PT. Pt should be able to return home when cleared by MD.       Goals:    Deferred as pt has no skilled PT needs    Patient and/orFamily Goals/Expectations: "To be able to go home soon and without oxygen"    Plan:       Pt seen for eval only, no PT needs. Will DC PT.     PT Recommendations for Nursing: encourage ambulation  PT Recommendations for Patient/Family: continue ambulating to maintain strength/mobility    Additional information:     Anticipated rehab needs at discharge:  None     Patient/family has given verbal consent for eval and treatment:  Yes    Is the patient appropriate for skilled PT at this time? no     Posey Pronto, PT

## 2012-10-09 NOTE — Discharge Instructions (Signed)
Your A1C is 7.8 = 177 Average Blood Sugar, Normal A1C is 7.0 or below  Type 2 Diabetes Mellitus, Adult  Type 2 diabetes mellitus, often simply referred to as type 2 diabetes, is a long-lasting (chronic) disease. In type 2 diabetes, the pancreas does not make enough insulin (a hormone), the cells are less responsive to the insulin that is made (insulin resistance), or both. Normally, insulin moves sugars from food into the tissue cells. The tissue cells use the sugars for energy. The lack of insulin or the lack of normal response to insulin causes excess sugars to build up in the blood instead of going into the tissue cells. As a result, high blood sugar (hyperglycemia) develops. The effect of high sugar (glucose) levels can cause many complications.  Type 2 diabetes was also previously called adult-onset diabetes but it can occur at any age.   RISK FACTORS   A person is predisposed to developing type 2 diabetes if someone in the family has the disease and also has one or more of the following primary risk factors:   Overweight.   An inactive lifestyle.   A history of consistently eating high-calorie foods.  Maintaining a normal weight and regular physical activity can reduce the chance of developing type 2 diabetes.  SYMPTOMS   A person with type 2 diabetes may not show symptoms initially. The symptoms of type 2 diabetes appear slowly. The symptoms include:   Increased thirst (polydipsia).   Increased urination (polyuria).   Increased urination during the night (nocturia).   Weight loss. This weight loss may be rapid.   Frequent, recurring infections.   Tiredness (fatigue).   Weakness.   Vision changes, such as blurred vision.   Fruity smell to your breath.   Abdominal pain.   Nausea or vomiting.   Cuts or bruises which are slow to heal.   Tingling or numbness in the hands or feet.  DIAGNOSIS  Type 2 diabetes is frequently not diagnosed until complications of diabetes are present. Type 2  diabetes is diagnosed when symptoms or complications are present and when blood glucose levels are increased. Your blood glucose level may be checked by one or more of the following blood tests:   A fasting blood glucose test. You will not be allowed to eat for at least 8 hours before a blood sample is taken.   A random blood glucose test. Your blood glucose is checked at any time of the day regardless of when you ate.   A hemoglobin A1c blood glucose test. A hemoglobin A1c test provides information about blood glucose control over the previous 3 months.   An oral glucose tolerance test (OGTT). Your blood glucose is measured after you have not eaten (fasted) for 2 hours and then after you drink a glucose-containing beverage.  TREATMENT    You may need to take insulin or diabetes medicine daily to keep blood glucose levels in the desired range.   You will need to match insulin dosing with exercise and healthy food choices.  The treatment goal is to maintain the before meal blood sugar (preprandial glucose) level at 70 130 mg/dL.  HOME CARE INSTRUCTIONS    Have your hemoglobin A1c level checked twice a year.   Perform daily blood glucose monitoring as directed by your caregiver.   Monitor urine ketones when you are ill and as directed by your caregiver.   Take your diabetes medicine or insulin as directed by your caregiver to maintain your blood glucose   levels in the desired range.   Never run out of diabetes medicine or insulin. It is needed every day.   Adjust insulin based on your intake of carbohydrates. Carbohydrates can raise blood glucose levels but need to be included in your diet. Carbohydrates provide vitamins, minerals, and fiber which are an essential part of a healthy diet. Carbohydrates are found in fruits, vegetables, whole grains, dairy products, legumes, and foods containing added sugars.      Eat healthy foods. Alternate 3 meals with 3 snacks.   Lose weight if overweight.   Carry a  medical alert card or wear your medical alert jewelry.   Carry a 15 gram carbohydrate snack with you at all times to treat low blood glucose (hypoglycemia). Some examples of 15 gram carbohydrate snacks include:   Glucose tablets, 3 or 4     Glucose gel, 15 gram tube   Raisins, 2 tablespoons (24 grams)   Jelly beans, 6   Animal crackers, 8   Regular pop, 4 ounces (120 mL)   Gummy treats, 9   Recognize hypoglycemia. Hypoglycemia occurs with blood glucose levels of 70 mg/dL and below. The risk for hypoglycemia increases when fasting or skipping meals, during or after intense exercise, and during sleep. Hypoglycemia symptoms can include:   Tremors or shakes.   Decreased ability to concentrate.   Sweating.   Increased heart rate.   Headache.   Dry mouth.   Hunger.   Irritability.   Anxiety.   Restless sleep.   Altered speech or coordination.   Confusion.   Treat hypoglycemia promptly. If you are alert and able to safely swallow, follow the 15:15 rule:   Take 15 20 grams of rapid-acting glucose or carbohydrate. Rapid-acting options include glucose gel, glucose tablets, or 4 ounces (120 mL) of fruit juice, regular soda, or low fat milk.   Check your blood glucose level 15 minutes after taking the glucose.   Take 15 20 grams more of glucose if the repeat blood glucose level is still 70 mg/dL or below.   Eat a meal or snack within 1 hour once blood glucose levels return to normal.      Be alert to polyuria and polydipsia which are early signs of hyperglycemia. An early awareness of hyperglycemia allows for prompt treatment. Treat hyperglycemia as directed by your caregiver.   Engage in at least 150 minutes of moderate-intensity physical activity a week, spread over at least 3 days of the week or as directed by your caregiver. In addition, you should engage in resistance exercise at least 2 times a week or as directed by your caregiver.   Adjust your medicine and food intake as needed if you  start a new exercise or sport.   Follow your sick day plan at any time you are unable to eat or drink as usual.   Avoid tobacco use.   Limit alcohol intake to no more than 1 drink per day for nonpregnant women and 2 drinks per day for men. You should drink alcohol only when you are also eating food. Talk with your caregiver whether alcohol is safe for you. Tell your caregiver if you drink alcohol several times a week.   Follow up with your caregiver regularly.   Schedule an eye exam soon after the diagnosis of type 2 diabetes and then annually.   Perform daily skin and foot care. Examine your skin and feet daily for cuts, bruises, redness, nail problems, bleeding, blisters, or sores. A foot exam   by a caregiver should be done annually.   Brush your teeth and gums at least twice a day and floss at least once a day. Follow up with your dentist regularly.   Share your diabetes management plan with your workplace or school.   Stay up-to-date with immunizations.   Learn to manage stress.   Obtain ongoing diabetes education and support as needed.   Participate in, or seek rehabilitation as needed to maintain or improve independence and quality of life. Request a physical or occupational therapy referral if you are having foot or hand numbness or difficulties with grooming, dressing, eating, or physical activity.  SEEK MEDICAL CARE IF:    You are unable to eat food or drink fluids for more than 6 hours.   You have nausea and vomiting for more than 6 hours.   Your blood glucose level is over 240 mg/dL.   There is a change in mental status.   You develop an additional serious illness.   You have diarrhea for more than 6 hours.   You have been sick or have had a fever for a couple of days and are not getting better.   You have pain during any physical activity.   SEEK IMMEDIATE MEDICAL CARE IF:   You have difficulty breathing.   You have moderate to large ketone levels.  MAKE SURE YOU:   Understand  these instructions.   Will watch your condition.   Will get help right away if you are not doing well or get worse.  Document Released: 12/27/2004 Document Revised: 09/21/2011 Document Reviewed: 07/26/2011  [employer] Patient Information 2014 [employer].

## 2012-10-10 LAB — PERFORM POC FINGERSTICK GLUCOSE
BLD GLUCOSE POCT: 446 mg/dL (ref 60–100)
BLD GLUCOSE POCT: 265 mg/dL — ABNORMAL HIGH (ref 60–100)
BLD GLUCOSE POCT: 278 mg/dL — ABNORMAL HIGH (ref 60–100)
BLD GLUCOSE POCT: 360 mg/dL — ABNORMAL HIGH (ref 60–100)

## 2012-10-10 MED ORDER — INSULIN LISPRO 100 UNIT/ML INJECTION SSIP - CITY
2.00 [IU] | Freq: Four times a day (QID) | SUBCUTANEOUS | Status: DC
Start: 2012-10-10 — End: 2012-10-11
  Administered 2012-10-10: 7 [IU] via SUBCUTANEOUS
  Administered 2012-10-10 (×2): 12 [IU] via SUBCUTANEOUS
  Administered 2012-10-10: 7 [IU] via SUBCUTANEOUS
  Administered 2012-10-11: 0 [IU] via SUBCUTANEOUS
  Filled 2012-10-10: qty 300

## 2012-10-10 MED ORDER — DEXTROSE 50 % IN WATER (D50W) INTRAVENOUS SYRINGE
12.50 g | INJECTION | INTRAVENOUS | Status: DC | PRN
Start: 2012-10-10 — End: 2012-10-11

## 2012-10-10 MED ORDER — INSULIN GLARGINE (U-100) 100 UNIT/ML SUBCUTANEOUS SOLUTION
10.0000 [IU] | Freq: Every day | SUBCUTANEOUS | Status: DC
Start: 2012-10-10 — End: 2012-10-11
  Administered 2012-10-10 – 2012-10-11 (×2): 10 [IU] via SUBCUTANEOUS
  Filled 2012-10-10 (×3): qty 10

## 2012-10-10 MED ADMIN — atorvastatin 20 mg tablet: 20 mg | ORAL | NDC 68084009811

## 2012-10-10 MED ADMIN — acetylcysteine 200 mg/mL (20 %) solution: 5 mL | RESPIRATORY_TRACT | NDC 00409330803

## 2012-10-10 MED ADMIN — heparin (porcine) 5,000 unit/mL injection solution: 5000 [IU] | SUBCUTANEOUS | NDC 25021040201

## 2012-10-10 MED ADMIN — sodium chloride 0.9 % (flush) injection syringe: 10 mL | INTRAVENOUS | NDC 08290306546

## 2012-10-10 MED ADMIN — azithromycin 250 mg tablet: 500 mg | ORAL | NDC 68084065611

## 2012-10-10 MED FILL — insulin U-100 regular human 100 unit/mL injection solution: INTRAMUSCULAR | Qty: 3 | Status: AC

## 2012-10-10 NOTE — Nurses Notes (Signed)
ON ROOM AIR AT REST O2 SAT 91%, O2 ON ROOM AIR AFTER AMBULATING 100 FEET 82%, THEN DURING AMBULATION O2 2L HIGH FLOW NC O2 SAT CAME UP TO 91% WHILE AMBULATING ABOUT 100 FEET BACK TO ROOM, O2 SAT ON 2L NC AT REST 94%.

## 2012-10-10 NOTE — Care Plan (Signed)
 Problem: Pain, Acute (Adult, Obstetrics)  Goal: Acceptable Pain Control/Comfort Level  Patient will demonstrate the desired outcomes.   Outcome: Ongoing (see interventions/notes)  Patient remains pain free during this shift.

## 2012-10-10 NOTE — Nurses Notes (Signed)
Pt has low 02 off oxygen pt desaturates to 82 on room air sitting in chair.02 2l high flow pt sats at 92%.

## 2012-10-10 NOTE — Progress Notes (Signed)
[facility]  [place], [place] [address]    IP PROGRESS NOTE      Gaskill,Ayame V  Date of Admission:  10/08/2012  Date of Birth:  [birth year]  Date of Service:  10/10/2012    Chief Complaint: Patient was admitted with COPD exacerbation and profound hypoxia.    Subjective: ROS: Patient is off of non rebreather mask. On O2 by nasal cannula. She is telling me that she does not want to be in the hospital. Denies any CP. She was not able to come off of O2 and was desaturating.     Vital Signs:  Temp (24hrs) Max:36.9 C (98.4 F)      Temperature: 36.6 C (97.9 F)  BP (Non-Invasive): 163/94 mmHg  Heart Rate: 105  Respiratory Rate: 20  Pain Score (Numeric, Faces): 0  SpO2-1: 90 %    Current Medications:    Current Facility-Administered Medications:  acetaminophen (TYLENOL) tablet 650 mg Oral Q6H PRN   acetylcysteine (MUCOMYST) 20% nebulized solution 5 mL Nebulization 3x/day   albuterol (PROVENTIL) 2.5mg / 0.5 mL nebulizer solution 2.5 mg Nebulization Q4H PRN   And      ipratropium (ATROVENT) 0.02% nebulizer solution 0.5 mg Nebulization Q4H PRN   albuterol (PROVENTIL) 2.5mg / 0.5 mL nebulizer solution 2.5 mg Nebulization 4x/day   And      ipratropium (ATROVENT) 0.02% nebulizer solution 0.5 mg Nebulization 4x/day   atorvastatin (LIPITOR) tablet 20 mg Oral QPM   azithromycin (ZITHROMAX) tablet 500 mg Oral Q24H   cefTRIAXone (ROCEPHIN) 1 g in NS 50 mL IVPB 1 g Intravenous Q24H   [START ON 10/11/2012] SSIP insulin R human (HUMULIN R) 100 units/mL injection  Subcutaneous 4x/day AC   And      dextrose 50% (0.5 g/mL) injection 12.5 g Intravenous Q15 Min PRN   fluticasone-salmeterol (ADVAIR) 500 mcg-50 mcg per inhalation oral inhaler 1 INHALATION Inhalation 2x/day   heparin 5,000 unit/mL injection 5,000 Units Subcutaneous Q8HRS   insulin glargine (LANTUS) 100 units/mL injection 10 Units Subcutaneous Daily   methylPREDNISolone sod succ (SOLU-MEDROL) 40 mg/mL injection 40 mg Intravenous Q6H    nitroglycerin (NITROSTAT) sublingual tablet 0.4 mg Sublingual Q5 Min PRN   NS flush syringe 10 mL Intravenous Q8HRS   sitaGLIPtin (JANUVIA) tablet 100 mg Oral Daily   SSIP insulin lispro (HUMALOG) 100 units/mL injection 2-12 Units Subcutaneous 4x/day AC       Today's Physical Exam:  GENERAL: Patient is alert, awake and oriented X 3. In no cardio respiratory distress. She is cyanotic and is short of breath in moderate distress. She is moderately obese.   HEENT: PERRLA, EOMI. She has poor dentition.   NECK: Supple, NO JVD.   HEART: S1+, S2+, rate controlled and rhythm regular. No murmurs appreciated.   LUNGS: Bilateral breath sounds coarse with expiratory wheezing.   ABDOMEN: Soft, NT, ND with NABS.   EXTREMITIES: No edema, pedal pulses palpable.   NEURO:  Motor exam is non focal.   SKIN: No rashes or bruises.   PSYCH: Pleasant with no signs of depression.      I/O:  I/O last 24 hours:      Intake/Output Summary (Last 24 hours) at 10/10/12 2148  Last data filed at 10/10/12 1800   Gross per 24 hour   Intake    500 ml   Output   1000 ml   Net   -500 ml     I/O current shift:       Nutrition/Residuals:  Labs  -  reviewed  Reviewed:   Lab Results for Last 24 Hours:    Results for orders placed during the hospital encounter of 10/08/12 (from the past 24 hour(s))   POCT FINGERSTICK GLUCOSE       Result Value Range    BLD GLUCOSE POCT 446 (*) 60 - 100 mg/dL   POCT FINGERSTICK GLUCOSE       Result Value Range    BLD GLUCOSE POCT 265 (*) 60 - 100 mg/dL   POCT FINGERSTICK GLUCOSE       Result Value Range    BLD GLUCOSE POCT 278 (*) 60 - 100 mg/dL   POCT FINGERSTICK GLUCOSE       Result Value Range    BLD GLUCOSE POCT 360 (*) 60 - 100 mg/dL     Ordered:      Diagnostic Tests - reviewed    Radiology Tests - reviewed    Problem List:  Active Hospital Problems   (*Primary Problem)    Diagnosis   . *Dyspnea   . COPD exacerbation   . Obesity (BMI 35.0-39.9 without comorbidity)       Assessment/ Plan:      Acute respiratory failure with hypoxic respiratory failure - on Non rebreather mask initially - wean to O2 by nasal cannula.  COPD exacerbation / acute -  Pneumonia needs to be ruled out as she is having elevated WBC. Change IV antibiotics to Ceftriaxone and Zithromax. Continue Bronchodilator nebs and IV Solumedrol.  Obtain sputum culture.   Long standing Tobacco abuse - D/W her at length regarding smoking cessation, Nicotine patch offered.   Leucocytosis with SIRS / Sepsis - obtain Sputum culture. Monitor clinically.   Obesity with a BMI of 37 - advised her to lose weight - May benefit from a sleep study as outpatient.   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   PLAN:  On Heparin for DVT prophylaxis. Add PPI for GI prophylaxis.   Follow serial labs.  Advised patient to stay and get treated and not leave AMA. I explained to her that she was hypoxic and will need to get better before she could go home, that if she leaves that she will get worse. She agreed to stay.   Patient was updated on plan of care.  She needs to be assessed for home O2 - she was desaturating without O2.

## 2012-10-10 NOTE — Nurses Notes (Signed)
Pt  Crying because she isnt discharged yelling and is very teary eyed and intermittent crying emotional support given sandwich also the patient was instructed re herj 02 sat drops on room air (pt may be going home with 02 pt was instructed home care for possible 02 equip to be followed up tomorrow.

## 2012-10-10 NOTE — Nurses Notes (Signed)
Pt on phone screaming crying labile with anger and the next crying

## 2012-10-11 ENCOUNTER — Inpatient Hospital Stay (HOSPITAL_BASED_OUTPATIENT_CLINIC_OR_DEPARTMENT_OTHER): Payer: BLUE CROSS/BLUE SHIELD

## 2012-10-11 DIAGNOSIS — J9611 Chronic respiratory failure with hypoxia: Secondary | ICD-10-CM | POA: Diagnosis present

## 2012-10-11 LAB — MANUAL DIFFERENTIAL - BMC/JMC ONLY
ATYPICAL LYMPHOCYTES: 1 % — ABNORMAL HIGH (ref 0–0)
BANDS: 1 % (ref 0–4)
LYMPHOCYTES: 10 % — ABNORMAL LOW (ref 16–45)
METAMYELOCYTES: 1 % (ref 0–1)
MONOCYTES: 3 % — ABNORMAL LOW (ref 4–10)
PLATELET ESTIMATE: ADEQUATE
PMN'S: 84 % — ABNORMAL HIGH (ref 45–74)
RBC MORPHOLOGY: NORMAL

## 2012-10-11 LAB — CBC
HCT: 47 % — ABNORMAL HIGH (ref 36.0–45.0)
HGB: 15.5 g/dL (ref 12.0–15.5)
MCH: 31 pg (ref 28.0–34.0)
MCHC: 33 g/dL (ref 33.0–37.0)
MCV: 94 fL (ref 82.0–97.0)
MPV: 7.5 fL (ref 7.0–9.4)
PLATELET COUNT: 379 10*3/uL (ref 150–400)
RBC: 5 M/uL (ref 4.00–5.10)
RDW: 12.5 % (ref 11.0–13.0)
WBC: 17.8 10*3/uL — ABNORMAL HIGH (ref 4.0–11.0)

## 2012-10-11 LAB — COMPREHENSIVE METABOLIC PROFILE - BMC/JMC ONLY
ALBUMIN: 3.6 g/dL (ref 3.2–5.0)
ALKALINE PHOSPHATASE: 70 IU/L (ref 35–120)
ALT (SGPT): 17 IU/L (ref 0–55)
AST (SGOT): 13 IU/L (ref 0–45)
BILIRUBIN, TOTAL: 0.6 mg/dL (ref 0.0–1.3)
BUN: 13 mg/dL (ref 6–22)
CALCIUM: 9.8 mg/dL (ref 8.5–10.5)
CARBON DIOXIDE: 34 mmol/L — ABNORMAL HIGH (ref 22–32)
CHLORIDE: 93 mmol/L — ABNORMAL LOW (ref 101–111)
CREATININE: 0.59 mg/dL (ref 0.53–1.00)
ESTIMATED GLOMERULAR FILTRATION RATE: >60 mL/min (ref >60)
GLUCOSE: 284 mg/dL — ABNORMAL HIGH (ref 70–110)
POTASSIUM: 4.6 mmol/L (ref 3.5–5.0)
SODIUM: 132 mmol/L — ABNORMAL LOW (ref 136–145)
TOTAL PROTEIN: 7.2 g/dL (ref 6.0–8.0)

## 2012-10-11 LAB — PERFORM POC FINGERSTICK GLUCOSE
BLD GLUCOSE POCT: 326 mg/dL — ABNORMAL HIGH (ref 60–100)
BLD GLUCOSE POCT: 431 mg/dL (ref 60–100)
BLD GLUCOSE POCT: 381 mg/dL — ABNORMAL HIGH (ref 60–100)
BLD GLUCOSE POCT: 254 mg/dL — ABNORMAL HIGH (ref 60–100)

## 2012-10-11 MED ORDER — ACETYLCYSTEINE 200 MG/ML (20 %) SOLUTION (RT USE CONFIG)
5.00 mL | Freq: Three times a day (TID) | Status: DC
Start: 2012-10-11 — End: 2015-11-11

## 2012-10-11 MED ORDER — TIOTROPIUM BROMIDE 18 MCG CAPSULE WITH INHALATION DEVICE
18.0000 ug | ORAL_CAPSULE | Freq: Every day | RESPIRATORY_TRACT | Status: DC
Start: 2012-10-11 — End: 2015-11-18

## 2012-10-11 MED ORDER — IPRATROPIUM BROMIDE 0.02 % SOLUTION FOR INHALATION
0.50 mg | Freq: Four times a day (QID) | RESPIRATORY_TRACT | Status: DC
Start: 2012-10-11 — End: 2012-10-11

## 2012-10-11 MED ORDER — CEFUROXIME AXETIL 500 MG TABLET
500.00 mg | ORAL_TABLET | Freq: Two times a day (BID) | ORAL | Status: AC
Start: 2012-10-11 — End: 2012-10-15

## 2012-10-11 MED ORDER — ALBUTEROL SULFATE CONCENTRATE 2.5 MG/0.5 ML SOLUTION FOR NEBULIZATION
2.50 mg | INHALATION_SOLUTION | Freq: Four times a day (QID) | RESPIRATORY_TRACT | Status: DC
Start: 2012-10-11 — End: 2018-11-30

## 2012-10-11 MED ORDER — FLUTICASONE-SALMETEROL 500 MCG-50 MCG/DOSE DISK DEVICE - EAST
1.00 | DISK | Freq: Two times a day (BID) | RESPIRATORY_TRACT | Status: DC
Start: 2012-10-11 — End: 2016-01-15

## 2012-10-11 MED ORDER — ATORVASTATIN 20 MG TABLET
20.00 mg | ORAL_TABLET | Freq: Every evening | ORAL | Status: DC
Start: 2012-10-11 — End: 2015-11-11

## 2012-10-11 MED ORDER — OMEPRAZOLE 20 MG CAPSULE,DELAYED RELEASE
20.00 mg | DELAYED_RELEASE_CAPSULE | Freq: Every day | ORAL | Status: DC
Start: 2012-10-11 — End: 2015-11-11

## 2012-10-11 MED ADMIN — heparin (porcine) 5,000 unit/mL injection solution: 5000 [IU] | SUBCUTANEOUS | NDC 25021040201

## 2012-10-11 MED ADMIN — sodium chloride 0.9 % (flush) injection syringe: 10 mL | INTRAVENOUS | NDC 08290306546

## 2012-10-11 MED ADMIN — azithromycin 250 mg tablet: 500 mg | ORAL | NDC 68084065611

## 2012-10-11 MED ADMIN — acetylcysteine 200 mg/mL (20 %) solution: 5 mL | RESPIRATORY_TRACT | NDC 00409330803

## 2012-10-11 MED ADMIN — insulin regular human 100 unit/mL injection solution: 8 [IU] | SUBCUTANEOUS | NDC 00002821517

## 2012-10-11 MED ADMIN — insulin regular human 100 unit/mL injection solution: 7 [IU] | SUBCUTANEOUS | NDC 00002821517

## 2012-10-11 MED ADMIN — insulin regular human 100 unit/mL injection solution: 5 [IU] | SUBCUTANEOUS | NDC 00002821517

## 2012-10-11 NOTE — Progress Notes (Signed)
 [facility]  [place], [place] [address]    IP PROGRESS NOTE      [name],[name]  Date of Admission:  10/08/2012  Date of Birth:  [birth year]  Date of Service:  10/11/2012    Chief Complaint: Patient was admitted with COPD exacerbation and profound hypoxia. She is a chronic smoker.     Subjective: ROS: Patient is off of non rebreather mask. On O2 by nasal cannula. She is telling me that she does not want to be in the hospital. Denies any CP. She was not able to come off of O2 and was desaturating. Home O2 is being arranged.     Vital Signs:  Temp (24hrs) Max:36.9 C (98.4 F)      Temperature: 36.8 C (98.2 F)  BP (Non-Invasive): 169/85 mmHg  Heart Rate: 93  Respiratory Rate: 112  Pain Score (Numeric, Faces): 0  SpO2-1: 95 %    Current Medications:    Current Facility-Administered Medications:  acetaminophen  (TYLENOL ) tablet 650 mg Oral Q6H PRN   acetylcysteine  (MUCOMYST ) 20% nebulized solution 5 mL Nebulization 3x/day   albuterol  (PROVENTIL ) 2.5mg / 0.5 mL nebulizer solution 2.5 mg Nebulization Q4H PRN   And      ipratropium (ATROVENT ) 0.02% nebulizer solution 0.5 mg Nebulization Q4H PRN   albuterol  (PROVENTIL ) 2.5mg / 0.5 mL nebulizer solution 2.5 mg Nebulization 4x/day   And      ipratropium (ATROVENT ) 0.02% nebulizer solution 0.5 mg Nebulization 4x/day   atorvastatin  (LIPITOR) tablet 20 mg Oral QPM   azithromycin  (ZITHROMAX ) tablet 500 mg Oral Q24H   cefTRIAXone  (ROCEPHIN ) 1 g in NS 50 mL IVPB 1 g Intravenous Q24H   SSIP insulin  R human (HUMULIN  R) 100 units/mL injection  Subcutaneous 4x/day AC   And      dextrose  50% (0.5 g/mL) injection 12.5 g Intravenous Q15 Min PRN   fluticasone -salmeterol (ADVAIR ) 500 mcg-50 mcg per inhalation oral inhaler 1 INHALATION Inhalation 2x/day   heparin  5,000 unit/mL injection 5,000 Units Subcutaneous Q8HRS   insulin  glargine (LANTUS ) 100 units/mL injection 10 Units Subcutaneous Daily   methylPREDNISolone  sod succ (SOLU-MEDROL ) 40 mg/mL injection 40 mg  Intravenous Q6H   nitroglycerin  (NITROSTAT ) sublingual tablet 0.4 mg Sublingual Q5 Min PRN   NS flush syringe 10 mL Intravenous Q8HRS   sitaGLIPtin  (JANUVIA ) tablet 100 mg Oral Daily       Today's Physical Exam:  GENERAL: Patient is alert, awake and oriented X 3. In no cardio respiratory distress. She is cyanotic and is short of breath in moderate distress. She is moderately obese.   HEENT: PERRLA, EOMI. She has poor dentition.   NECK: Supple, NO JVD.   HEART: S1+, S2+, rate controlled and rhythm regular. No murmurs appreciated.   LUNGS: Bilateral breath sounds coarse with expiratory wheezing.   ABDOMEN: Soft, NT, ND with NABS.   EXTREMITIES: No edema, pedal pulses palpable.   NEURO:  Motor exam is non focal.   SKIN: No rashes or bruises.   PSYCH: Pleasant with no signs of depression.      I/O:  I/O last 24 hours:      Intake/Output Summary (Last 24 hours) at 10/11/12 1613  Last data filed at 10/10/12 2200   Gross per 24 hour   Intake     10 ml   Output      0 ml   Net     10 ml     I/O current shift:       Nutrition/Residuals:  Labs  -  reviewed  Reviewed:   Lab Results for Last 24 Hours:    Results for orders placed during the hospital encounter of 10/08/12 (from the past 24 hour(s))   POCT FINGERSTICK GLUCOSE       Result Value Range    BLD GLUCOSE POCT 278 (*) 60 - 100 mg/dL   POCT FINGERSTICK GLUCOSE       Result Value Range    BLD GLUCOSE POCT 360 (*) 60 - 100 mg/dL   CBC       Result Value Range    WBC 17.8 (*) 4.0 - 11.0 K/uL    RBC 5.00  4.00 - 5.10 M/uL    HGB 15.5  12.0 - 15.5 g/dL    HCT 52.9 (*) 63.9 - 45.0 %    MCV 94.0  82.0 - 97.0 fL    MCH 31.0  28.0 - 34.0 pg    MCHC 33.0  33.0 - 37.0 g/dL    RDW 87.4  88.9 - 86.9 %    PLATELET COUNT 379  150 - 400 K/uL    MPV 7.5  7.0 - 9.4 fL   COMPREHENSIVE METABOLIC PROFILE - [facility] ONLY       Result Value Range    GLUCOSE 284 (*) 70 - 110 mg/dL    BUN 13  6 - 22 mg/dL    CREATININE 9.40  9.46 - 1.00 mg/dL    ESTIMATED GLOMERULAR FILTRATION RATE >60  >60  ml/min    SODIUM 132 (*) 136 - 145 mmol/L    POTASSIUM 4.6  3.5 - 5.0 mmol/L    CHLORIDE 93 (*) 101 - 111 mmol/L    CARBON DIOXIDE 34 (*) 22 - 32 mmol/L    CALCIUM 9.8  8.5 - 10.5 mg/dL    TOTAL PROTEIN 7.2  6.0 - 8.0 g/dL    ALBUMIN 3.6  3.2 - 5.0 g/dL    BILIRUBIN, TOTAL 0.6  0.0 - 1.3 mg/dL    AST (SGOT) 13  0 - 45 IU/L    ALT (SGPT) 17  0 - 55 IU/L    ALKALINE PHOSPHATASE 70  35 - 120 IU/L   MANUAL DIFFERENTIAL - [facility] ONLY       Result Value Range    METAMYELOCYTES 1  0 - 1 %    BANDS 1  0 - 4 %    PMN'S 84 (*) 45 - 74 %    LYMPHOCYTES 10 (*) 16 - 45 %    AYTPICAL LYMPHOCYTES 1 (*) 0 - 0 %    MONOCYTES 3 (*) 4 - 10 %    PLATELET ESTIMATE ADEQUATE  ADEQUATE    RBC MORPHOLOGY NORMAL  NORMAL   POCT FINGERSTICK GLUCOSE       Result Value Range    BLD GLUCOSE POCT 431 (*) 60 - 100 mg/dL   POCT FINGERSTICK GLUCOSE       Result Value Range    BLD GLUCOSE POCT 326 (*) 60 - 100 mg/dL   POCT FINGERSTICK GLUCOSE       Result Value Range    BLD GLUCOSE POCT 381 (*) 60 - 100 mg/dL     Ordered:      Diagnostic Tests - reviewed    Radiology Tests - reviewed    Problem List:  Active Hospital Problems   (*Primary Problem)    Diagnosis   . *Dyspnea   . COPD exacerbation   . Obesity (BMI 35.0-39.9 without comorbidity)  Assessment/ Plan:     Acute respiratory failure with hypoxic respiratory failure - on Non rebreather mask initially - weaned to O2 by nasal cannula. Home O2 was arranged at 2 liters. She was advised not to smoke around O2.   COPD exacerbation / acute -  No Pneumonia per CXR. On IV antibiotics to Ceftriaxone  and Zithromax . Continue Bronchodilator nebs and IV Solumedrol.  Sputum culture was requested.   Long standing Tobacco abuse - D/W her at length regarding smoking cessation, Nicotine  patch offered.   Leucocytosis with SIRS / Sepsis - Sputum culture was not done.  Obesity with a BMI of 37 - advised her to lose weight - May benefit from a sleep study as outpatient.   Uncontrolled type II DM - likely from  steroids - check FS BS and cover with SSI Protocol for now.   PLAN:  Home O2 was arranged.   For discharge to home today, per her request - I would like to keep her one more day.

## 2012-10-11 NOTE — Nurses Notes (Signed)
 [facility] for [facility] here and picked up portable oxygen  tank to return it.

## 2012-10-11 NOTE — Care Management Notes (Addendum)
Call to [facility] re referral for o2 del as pt insurance is out of network with [employer] .  Sent info via allscripts to [facility] for hospital del this pm. [employer] > [phone]

## 2012-10-11 NOTE — Nurses Notes (Signed)
 Patient discharged home with family with home oxygen  ([employer]).  AVS reviewed with patient/care giver.  A written copy of the AVS and discharge instructions was given to the patient/care giver.  Questions sufficiently answered as needed.  Patient/care giver encouraged to follow up with PCP as indicated.  In the event of an emergency, patient/care giver instructed to call 911 or go to the nearest emergency room. IV and telemetry removed. Pt. Wheeled down to lobby in wheelchair.

## 2012-10-11 NOTE — Discharge Summary (Signed)
[facility]  [place], [place] [address]    DISCHARGE SUMMARY      PATIENT NAMENashira, [name]  MRN:  [id number]  DOB:  1955-03-25    ADMISSION DATE:  10/08/2012  DISCHARGE DATE:  10/11/2012    ATTENDING PHYSICIAN: Pedro Earls, MD    PRIMARY CARE PHYSICIAN: Verline Lema, MD     ADMISSION DIAGNOSIS: Dyspnea  Chief Complaint   Patient presents with   . Shortness of Breath       DISCHARGE DIAGNOSIS:   Active Hospital Problems    Diagnosis Date Noted   . Principle Problem: Dyspnea 10/08/2012   . Tobacco abuse 10/11/2012   . Chronic respiratory failure with hypoxia 10/11/2012   . COPD exacerbation 10/08/2012   . Obesity (BMI 35.0-39.9 without comorbidity) 10/08/2012      Resolved Hospital Problems    Diagnosis    No resolved problems to display.     There are no active non-hospital problems to display for this patient.     DISCHARGE MEDICATIONS:  Current Discharge Medication List      START taking these medications    Details   acetylcysteine (MUCOMYST) 200 mg/mL (20 %) Solution 5 mL by Nebulization route Three times a day  Qty: 100 mL, Refills: 0      albuterol (PROVENTIL) 2.5 mg/0.5 mL Inhalation Solution for Nebulization 2.5 mg by Nebulization route Four times a day  Qty: 50 Each, Refills: 0      cefUROXime (CEFTIN) 500 mg Oral Tablet Take 1 Tab (500 mg total) by mouth Twice daily for 4 days  Qty: 8 Tab, Refills: 0      fluticasone-salmeterol (ADVAIR) 500-50 mcg/dose Inhalation Disk with Device oral diskus inhaler Take 1 INHALATION by inhalation Twice daily  Qty: 1 Inhaler, Refills: 0      omeprazole (PRILOSEC) 20 mg Oral Capsule, Delayed Release(E.C.) Take 1 Cap (20 mg total) by mouth Once a day  Qty: 30 Cap, Refills: 0         CONTINUE these medications which have CHANGED    Details   atorvastatin (LIPITOR) 20 mg Oral Tablet Take 1 Tab (20 mg total) by mouth Every evening  Qty: 30 Tab, Refills: 0       tiotropium bromide (SPIRIVA WITH HANDIHALER) 18 mcg Inhalation Capsule, w/Inhalation Device Take 1 Cap (18 mcg total) by inhalation Once a day  Qty: 30 Cap, Refills: 0         CONTINUE these medications which have NOT CHANGED    Details   ipratropium-albuterol (COMBIVENT RESPIMAT) 20-100 mcg/actuation Inhalation Aerosol Take 1 Puff by inhalation Four times a day      levalbuterol HCl (XOPENEX) 0.31 mg/3 mL Inhalation Solution for Nebulization 0.31 mg by Nebulization route Every 4 hours as needed      sitaGLIPtin (JANUVIA) 100 mg Oral Tablet Take 100 mg by mouth Once a day         STOP taking these medications       Ipratropium-Albuterol (DUONEB) 0.5 mg-3 mg(2.5 mg base)/3 mL Inhalation Solution for Nebulization Comments:   Reason for Stopping:             DISCHARGE INSTRUCTIONS:     DISCHARGE INSTRUCTION - DIET   Diet: LOW FAT DIET      DME - HOME OXYGEN   Study used to evaluate oxygen saturation at rest vs exercise was performed during an inpatient hospital stay within 2 days prior to discharge date and was the last  test performed prior to discharge and met the following criteria:   Study performed at rest without oxygen.  Study performed during exercise without oxygen.  Study performed during exercise with oxygen applied that demonstrates improvement.   Ht 160 cm    Wt 95.84 kg    Current Attending: Marcela Alatorre    I certify this pt is under my care as an attending physician & that I, or a collaborating ANP/PA had a face to face encounter with the pt or the durable medical power of attorney, as appropriate and explained the need for DME on the following date: 10/11/2012    The primary diagnosis as discussed in the face to face encounter justifying the need for home health services/DME is as follows: COPD    Liter flow per minute 2L cont    RA O2 Sats at rest (enter date/time in comment field) 91     I certify that home oxygen is necessary due to the following condition At rest, O2 sat is >89% on room air but during exercise O2 sat is <88% and O2 administration improves the hypoxemia    Physician has determined that patient has a severe lung disease or hypoxia related symptoms that will improve with oxygen Yes    Start Date 10/11/2012    Type of Concentrator STATIONARY CONCENTRATOR    RA O2 sats with ambulation (if sats 89% or greater @ rest) 82    O2 sats with ambulation AND oxygen at liter flow (if sats 89% or greater @ rest): 91    Oxygen use will be continuous? Yes    Patient is mobile within the home and will require portable oxygen: Yes    Delivery Device NASAL CANNULA      REASON FOR HOSPITALIZATION AND HOSPITAL COURSE:  This is a 57 y.o., female who presented with COPD exacerbation and elevated white cell count. She was on empiric IV antibiotics with improvement of her symptoms.    Please see the following notes for details.     SIGNIFICANT PHYSICAL FINDINGS: Please see my progress notes today.   SIGNIFICANT LAB: All labs reviewed.  SIGNIFICANT RADIOLOGY: Emphysema.  CONSULTATIONS: None  PROCEDURES PERFORMED: She was on 100 % O2 by Non rebreather mask when she came in, O2 is being weaned.    COURSE IN HOSPITAL:   Acute respiratory failure with hypoxic respiratory failure - on Non rebreather mask initially - weaned to O2 by nasal cannula. Home O2 was arranged at 2 liters. She was advised not to smoke around O2.   Acute tracheobronchitis. As follow.   COPD exacerbation / acute - No Pneumonia per CXR. On IV antibiotics to Ceftriaxone and Zithromax. Continue Bronchodilator nebs and IV Solumedrol. Sputum culture was requested.   Long standing Tobacco abuse - D/W her at length regarding smoking cessation, Nicotine patch offered.   Leucocytosis with SIRS / Sepsis - Sputum culture was not done.  Obesity with a BMI of 37 - advised her to lose weight - May benefit from a sleep study as outpatient.    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   Chronic hypoxic respiratory failure - Home O2 was arranged. Please note the room air O2 assessment documented in the chart.     CONDITION ON DISCHARGE: Alert, Oriented and VS Stable    DISCHARGE DISPOSITION:  Home discharge     Time spent with discharge preparation is 45 minutes.  Copies sent to Care Team      Relationship Specialty Notifications Start End    Verline Lema PCP - General EXTERNAL  10/08/12     Phone: [phone] Fax: [phone]         [id number] [place] [address]

## 2012-10-11 NOTE — Care Management Notes (Signed)
Ref for home 02 sent to [facility] for hospital del. Pt aware to wait for portable del.

## 2012-10-12 NOTE — CDI REVIEW (Signed)
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

## 2012-10-13 LAB — BLOOD CULTURE - BMC ONLY
BLOOD CULTURE: NO GROWTH
BLOOD CULTURE: NO GROWTH

## 2012-10-15 NOTE — CDI REVIEW (Signed)
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

## 2012-10-15 NOTE — CDI QUERY (Signed)
[facility] - Query     PEV:   SEV  Query Method:  Written  Response:  Agreed  Query Type:  Resolved-Ruled Out  Physician:  Pedro Earls    Comments:

## 2014-10-30 ENCOUNTER — Inpatient Hospital Stay (HOSPITAL_BASED_OUTPATIENT_CLINIC_OR_DEPARTMENT_OTHER)
Admission: EM | Admit: 2014-10-30 | Discharge: 2014-11-02 | DRG: 871 | Disposition: A | Discharge status: Home / Self Care | Payer: BC Managed Care – PPO | Attending: Internal Medicine | Admitting: Internal Medicine

## 2014-10-30 ENCOUNTER — Encounter (HOSPITAL_BASED_OUTPATIENT_CLINIC_OR_DEPARTMENT_OTHER): Payer: Self-pay

## 2014-10-30 ENCOUNTER — Emergency Department (HOSPITAL_BASED_OUTPATIENT_CLINIC_OR_DEPARTMENT_OTHER): Payer: BC Managed Care – PPO

## 2014-10-30 DIAGNOSIS — A419 Sepsis, unspecified organism: Secondary | ICD-10-CM

## 2014-10-30 DIAGNOSIS — R000 Tachycardia, unspecified: Secondary | ICD-10-CM

## 2014-10-30 DIAGNOSIS — K219 Gastro-esophageal reflux disease without esophagitis: Secondary | ICD-10-CM | POA: Diagnosis present

## 2014-10-30 DIAGNOSIS — J9621 Acute and chronic respiratory failure with hypoxia: Secondary | ICD-10-CM | POA: Diagnosis present

## 2014-10-30 DIAGNOSIS — J189 Pneumonia, unspecified organism: Secondary | ICD-10-CM | POA: Diagnosis present

## 2014-10-30 DIAGNOSIS — J449 Chronic obstructive pulmonary disease, unspecified: Secondary | ICD-10-CM

## 2014-10-30 DIAGNOSIS — E785 Hyperlipidemia, unspecified: Secondary | ICD-10-CM | POA: Diagnosis present

## 2014-10-30 DIAGNOSIS — R6520 Severe sepsis without septic shock: Secondary | ICD-10-CM | POA: Diagnosis present

## 2014-10-30 DIAGNOSIS — F329 Major depressive disorder, single episode, unspecified: Secondary | ICD-10-CM | POA: Diagnosis present

## 2014-10-30 DIAGNOSIS — Z6839 Body mass index (BMI) 39.0-39.9, adult: Secondary | ICD-10-CM

## 2014-10-30 DIAGNOSIS — E1165 Type 2 diabetes mellitus with hyperglycemia: Secondary | ICD-10-CM | POA: Diagnosis present

## 2014-10-30 DIAGNOSIS — F17200 Nicotine dependence, unspecified, uncomplicated: Secondary | ICD-10-CM | POA: Diagnosis present

## 2014-10-30 DIAGNOSIS — I493 Ventricular premature depolarization: Secondary | ICD-10-CM

## 2014-10-30 DIAGNOSIS — J441 Chronic obstructive pulmonary disease with (acute) exacerbation: Secondary | ICD-10-CM | POA: Diagnosis present

## 2014-10-30 DIAGNOSIS — R0902 Hypoxemia: Secondary | ICD-10-CM

## 2014-10-30 DIAGNOSIS — I10 Essential (primary) hypertension: Secondary | ICD-10-CM | POA: Diagnosis present

## 2014-10-30 LAB — COMPREHENSIVE METABOLIC PROFILE - BMC/JMC ONLY
ALBUMIN/GLOBULIN RATIO: 1.1 (ref 0.8–2.0)
ALKALINE PHOSPHATASE: 80 U/L (ref 38–126)
ALT (SGPT): 37 U/L (ref 14–54)
ANION GAP: 12 mmol/L — ABNORMAL HIGH (ref 3–11)
AST (SGOT): 25 U/L (ref 15–41)
BUN: 11 mg/dL (ref 6–20)
CHLORIDE: 95 mmol/L — ABNORMAL LOW (ref 101–111)
CO2 TOTAL: 30 mmol/L (ref 22–32)
ESTIMATED GFR: >60 mL/min/1.73mˆ2 (ref >60)
GLUCOSE: 249 mg/dL — ABNORMAL HIGH (ref 70–110)
PROTEIN TOTAL: 7.2 g/dL (ref 6.4–8.3)

## 2014-10-30 LAB — CBC WITH DIFF
BASOPHIL #: 0.2 x10ˆ3/uL — ABNORMAL HIGH (ref 0.00–0.10)
BASOPHIL %: 1 % (ref 0–3)
EOSINOPHIL #: 0.2 x10ˆ3/uL (ref 0.00–0.50)
EOSINOPHIL %: 1 % (ref 0–5)
HCT: 46.8 % — ABNORMAL HIGH (ref 36.0–45.0)
HGB: 15.4 g/dL (ref 12.0–15.5)
LYMPHOCYTE #: 4.5 10*3/uL (ref 1.00–4.80)
LYMPHOCYTE #: 4.5 x10ˆ3/uL (ref 1.00–4.80)
LYMPHOCYTE %: 27 % (ref 15–43)
MCH: 30.4 pg (ref 27.5–33.2)
MCHC: 32.9 g/dL (ref 32.0–36.0)
MCV: 92.6 fL (ref 82.0–97.0)
MONOCYTE #: 1.7 x10ˆ3/uL — ABNORMAL HIGH (ref 0.20–0.90)
MONOCYTE %: 10 % (ref 5–12)
MPV: 8.5 fL (ref 7.4–10.5)
NEUTROPHIL #: 10.5 x10ˆ3/uL — ABNORMAL HIGH (ref 1.50–6.50)
NEUTROPHIL %: 61 % (ref 43–76)
PLATELETS: 300 x10ˆ3/uL (ref 150–450)
RBC: 5.05 x10ˆ6/uL (ref 4.00–5.10)
RDW: 14.9 % (ref 11.0–16.0)
WBC: 17.1 x10ˆ3/uL — ABNORMAL HIGH (ref 4.0–11.0)

## 2014-10-30 LAB — LACTIC ACID LEVEL: LACTIC ACID: 1.7 mmol/L (ref 0.5–2.2)

## 2014-10-30 LAB — PT/INR
INR: 1.04
PROTHROMBIN TIME: 11.4 s (ref 9.4–12.5)

## 2014-10-30 LAB — URINALYSIS WITH MICROSCOPIC REFLEX IF INDICATED BMC/JMC ONLY
BLOOD: NEGATIVE mg/dL
GLUCOSE: >=500 mg/dL — AB
KETONES: NEGATIVE mg/dL
LEUKOCYTES: NEGATIVE WBCs/uL
NITRITE: NEGATIVE
PROTEIN: NEGATIVE mg/dL
UROBILINOGEN: 4 mg/dL — AB (ref <=2.0)

## 2014-10-30 LAB — TROPONIN-I
TROPONIN I: <0.03 ng/mL (ref <=0.06)
TROPONIN I: <0.03 ng/mL (ref <=0.06)

## 2014-10-30 LAB — PTT (PARTIAL THROMBOPLASTIN TIME): APTT: 33.6 s (ref 25.1–36.5)

## 2014-10-30 MED ORDER — IPRATROPIUM BROMIDE 0.02 % SOLUTION FOR INHALATION
0.50 mg | RESPIRATORY_TRACT | Status: DC
Start: 2014-10-30 — End: 2014-11-02
  Administered 2014-10-31: 0 mg via RESPIRATORY_TRACT
  Administered 2014-10-31 – 2014-11-01 (×6): 0.5 mg via RESPIRATORY_TRACT
  Administered 2014-11-01: 0 mg via RESPIRATORY_TRACT
  Administered 2014-11-01 (×2): 0.5 mg via RESPIRATORY_TRACT
  Administered 2014-11-01: 0 mg via RESPIRATORY_TRACT
  Administered 2014-11-01: 0.5 mg via RESPIRATORY_TRACT
  Administered 2014-11-02: 0 mg via RESPIRATORY_TRACT
  Administered 2014-11-02: 0.5 mg via RESPIRATORY_TRACT
  Filled 2014-10-30 (×11): qty 1

## 2014-10-30 MED ORDER — AZITHROMYCIN 500 MG INTRAVENOUS SOLUTION
500.00 mg | INTRAVENOUS | Status: AC
Start: 2014-10-30 — End: 2014-10-30
  Administered 2014-10-30: 500 mg via INTRAVENOUS
  Administered 2014-10-30: 0 mg via INTRAVENOUS
  Filled 2014-10-30: qty 5

## 2014-10-30 MED ORDER — ALBUTEROL SULFATE CONCENTRATE 2.5 MG/0.5 ML SOLUTION FOR NEBULIZATION
INHALATION_SOLUTION | RESPIRATORY_TRACT | Status: AC
Start: 2014-10-30 — End: 2014-10-30
  Administered 2014-10-30: 2.5 mg via RESPIRATORY_TRACT
  Filled 2014-10-30: qty 1

## 2014-10-30 MED ORDER — AMPICILLIN-SULBACTAM 15 GRAM SOLUTION FOR INJECTION
3.00 g | Freq: Four times a day (QID) | INTRAMUSCULAR | Status: DC
Start: 2014-10-31 — End: 2014-11-02
  Administered 2014-10-31 (×2): 3 g via INTRAVENOUS
  Administered 2014-10-31: 0 g via INTRAVENOUS
  Administered 2014-10-31: 3 g via INTRAVENOUS
  Administered 2014-10-31: 0 g via INTRAVENOUS
  Administered 2014-10-31 – 2014-11-01 (×2): 3 g via INTRAVENOUS
  Administered 2014-11-01: 0 g via INTRAVENOUS
  Administered 2014-11-01 (×2): 3 g via INTRAVENOUS
  Administered 2014-11-01: 0 g via INTRAVENOUS
  Administered 2014-11-01 – 2014-11-02 (×3): 3 g via INTRAVENOUS
  Filled 2014-10-30 (×15): qty 20

## 2014-10-30 MED ORDER — FLUTICASONE-SALMETEROL 500 MCG-50 MCG/DOSE DISK DEVICE - EAST
1.00 | DISK | Freq: Two times a day (BID) | RESPIRATORY_TRACT | Status: DC
Start: 2014-10-31 — End: 2014-11-02
  Administered 2014-10-31 – 2014-11-02 (×6): 1 via RESPIRATORY_TRACT
  Filled 2014-10-30: qty 14

## 2014-10-30 MED ORDER — ALBUTEROL SULFATE CONCENTRATE 2.5 MG/0.5 ML SOLUTION FOR NEBULIZATION
2.5000 mg | INHALATION_SOLUTION | RESPIRATORY_TRACT | Status: AC
Start: 2014-10-30 — End: 2014-10-30

## 2014-10-30 MED ORDER — ALBUTEROL SULFATE CONCENTRATE 2.5 MG/0.5 ML SOLUTION FOR NEBULIZATION
2.5000 mg | INHALATION_SOLUTION | Freq: Once | RESPIRATORY_TRACT | Status: AC
Start: 2014-10-31 — End: 2014-10-30

## 2014-10-30 MED ORDER — IPRATROPIUM BROMIDE 0.02 % SOLUTION FOR INHALATION
RESPIRATORY_TRACT | Status: AC
Start: 2014-10-30 — End: 2014-10-30
  Administered 2014-10-30: 0.5 mg via RESPIRATORY_TRACT
  Filled 2014-10-30: qty 1

## 2014-10-30 MED ORDER — IPRATROPIUM BROMIDE 0.02 % SOLUTION FOR INHALATION
0.5000 mg | RESPIRATORY_TRACT | Status: AC
Start: 2014-10-30 — End: 2014-10-30

## 2014-10-30 MED ORDER — ALBUTEROL SULFATE CONCENTRATE 2.5 MG/0.5 ML SOLUTION FOR NEBULIZATION
2.50 mg | INHALATION_SOLUTION | RESPIRATORY_TRACT | Status: DC
Start: 2014-10-30 — End: 2014-11-02
  Administered 2014-10-31 (×2): 2.5 mg via RESPIRATORY_TRACT
  Administered 2014-10-31: 0 mg via RESPIRATORY_TRACT
  Administered 2014-10-31 (×3): 2.5 mg via RESPIRATORY_TRACT
  Administered 2014-11-01 (×2): 0 mg via RESPIRATORY_TRACT
  Administered 2014-11-01 – 2014-11-02 (×5): 2.5 mg via RESPIRATORY_TRACT
  Administered 2014-11-02: 0 mg via RESPIRATORY_TRACT
  Filled 2014-10-30: qty 0.5
  Filled 2014-10-30: qty 1
  Filled 2014-10-30: qty 0.5
  Filled 2014-10-30 (×2): qty 1
  Filled 2014-10-30 (×2): qty 0.5
  Filled 2014-10-30 (×3): qty 1
  Filled 2014-10-30: qty 0.5

## 2014-10-30 MED ORDER — ACETYLCYSTEINE 200 MG/ML (20 %) SOLUTION (RT USE CONFIG)
4.00 mL | Freq: Three times a day (TID) | Status: AC
Start: 2014-10-31 — End: 2014-11-01
  Administered 2014-10-31 (×2): 4 mL via RESPIRATORY_TRACT
  Administered 2014-10-31: 0 mL via RESPIRATORY_TRACT
  Administered 2014-11-01 (×2): 4 mL via RESPIRATORY_TRACT
  Filled 2014-10-30 (×4): qty 4

## 2014-10-30 MED ORDER — ACETYLCYSTEINE 200 MG/ML (20 %) SOLUTION (RT USE CONFIG)
5.00 mL | Freq: Three times a day (TID) | Status: DC
Start: 2014-10-31 — End: 2014-10-30

## 2014-10-30 MED ORDER — IPRATROPIUM BROMIDE 0.02 % SOLUTION FOR INHALATION
0.5000 mg | Freq: Once | RESPIRATORY_TRACT | Status: AC
Start: 2014-10-31 — End: 2014-10-30

## 2014-10-30 MED ORDER — METHYLPREDNISOLONE SOD SUCC 125 MG SOLUTION FOR INJECTION WRAPPER
125.00 mg | INTRAVENOUS | Status: AC
Start: 2014-10-30 — End: 2014-10-30
  Administered 2014-10-30: 125 mg via INTRAVENOUS
  Filled 2014-10-30: qty 2

## 2014-10-30 MED ORDER — SODIUM CHLORIDE 0.9 % IV BOLUS
30.0000 mL/kg | INJECTION | Status: AC
Start: 2014-10-30 — End: 2014-10-30
  Administered 2014-10-30: 2859 mL via INTRAVENOUS

## 2014-10-30 MED ORDER — ALBUTEROL SULFATE CONCENTRATE 2.5 MG/0.5 ML SOLUTION FOR NEBULIZATION
2.50 mg | INHALATION_SOLUTION | RESPIRATORY_TRACT | Status: DC | PRN
Start: 2014-10-30 — End: 2014-11-02

## 2014-10-30 MED ORDER — DEXTROSE 5 % IN WATER (D5W) INTRAVENOUS SOLUTION
500.00 mg | INTRAVENOUS | Status: DC
Start: 2014-10-31 — End: 2014-11-02
  Administered 2014-10-31: 0 mg via INTRAVENOUS
  Administered 2014-10-31 – 2014-11-02 (×2): 500 mg via INTRAVENOUS
  Filled 2014-10-30 (×3): qty 5

## 2014-10-30 MED ORDER — ATORVASTATIN 20 MG TABLET
20.00 mg | ORAL_TABLET | Freq: Every evening | ORAL | Status: DC
Start: 2014-10-31 — End: 2014-11-02
  Administered 2014-10-31 – 2014-11-01 (×2): 20 mg via ORAL
  Filled 2014-10-30 (×3): qty 1

## 2014-10-30 MED ORDER — OMEPRAZOLE 20 MG CAPSULE,DELAYED RELEASE
20.00 mg | DELAYED_RELEASE_CAPSULE | Freq: Every day | ORAL | Status: DC
Start: 2014-10-31 — End: 2014-11-02
  Administered 2014-10-31 – 2014-11-02 (×3): 20 mg via ORAL
  Filled 2014-10-30 (×3): qty 1

## 2014-10-30 MED ORDER — ALBUTEROL SULFATE CONCENTRATE 2.5 MG/0.5 ML SOLUTION FOR NEBULIZATION
2.50 mg | INHALATION_SOLUTION | RESPIRATORY_TRACT | Status: AC
Start: 2014-10-30 — End: 2014-10-30
  Administered 2014-10-30: 2.5 mg via RESPIRATORY_TRACT

## 2014-10-30 MED ORDER — DEXTROSE 50 % IN WATER (D50W) INTRAVENOUS SYRINGE
12.50 g | INJECTION | INTRAVENOUS | Status: DC | PRN
Start: 2014-10-30 — End: 2014-11-02

## 2014-10-30 MED ORDER — INSULIN GLARGINE (U-100) 100 UNIT/ML SUBCUTANEOUS SOLUTION
10.00 [IU] | Freq: Every evening | SUBCUTANEOUS | Status: DC
Start: 2014-10-31 — End: 2014-11-02
  Administered 2014-10-31 – 2014-11-01 (×3): 10 [IU] via SUBCUTANEOUS
  Filled 2014-10-30: qty 200

## 2014-10-30 MED ORDER — IPRATROPIUM BROMIDE 0.02 % SOLUTION FOR INHALATION
0.50 mg | RESPIRATORY_TRACT | Status: AC
Start: 2014-10-30 — End: 2014-10-30
  Administered 2014-10-30: 0.5 mg via RESPIRATORY_TRACT

## 2014-10-30 MED ORDER — CEFTRIAXONE 1 GRAM/50 ML IN DEXTROSE (ISO-OSMOT) DUPLEX
1.0000 g | INJECTION | INTRAVENOUS | Status: AC
Start: 2014-10-30 — End: 2014-10-30
  Administered 2014-10-30: 1 g via INTRAVENOUS
  Administered 2014-10-30: 0 g via INTRAVENOUS
  Filled 2014-10-30: qty 50

## 2014-10-30 MED ORDER — METHYLPREDNISOLONE SOD SUCCINATE 40 MG/ML SOLUTION FOR INJ. WRAPPER
40.00 mg | Freq: Two times a day (BID) | INTRAMUSCULAR | Status: DC
Start: 2014-10-31 — End: 2014-11-02
  Administered 2014-10-31 – 2014-11-02 (×5): 40 mg via INTRAVENOUS
  Filled 2014-10-30 (×5): qty 1

## 2014-10-30 MED ORDER — INSULIN LISPRO 100 UNIT/ML INJECTION SSIP - CITY
1.00 [IU] | Freq: Four times a day (QID) | SUBCUTANEOUS | Status: DC
Start: 2014-10-31 — End: 2014-11-02
  Administered 2014-10-31: 5 [IU] via SUBCUTANEOUS
  Administered 2014-10-31 (×2): 7 [IU] via SUBCUTANEOUS
  Administered 2014-10-31: 3 [IU] via SUBCUTANEOUS
  Administered 2014-11-01 (×2): 7 [IU] via SUBCUTANEOUS
  Administered 2014-11-01: 5 [IU] via SUBCUTANEOUS
  Administered 2014-11-01 – 2014-11-02 (×2): 7 [IU] via SUBCUTANEOUS
  Filled 2014-10-30: qty 300

## 2014-10-30 MED ADMIN — sodium chloride 0.9 % intravenous solution: INTRAVENOUS | @ 23:00:00

## 2014-10-30 NOTE — ED Provider Notes (Signed)
[name], [name]  Emergency Department Visit Note    Date:  10/30/2014  Primary care provider:  Emelda Brothers, MD  Means of arrival:  private car  History obtained from: patient  History limited by: none    Chief Complaint: Shortness of breath      HISTORY OF PRESENT ILLNESS     [name], date of birth 1955-12-03, is a 59 y.o. female with a history of chronic obstructive pulmonary disease (oxygen as needed) and tobacco abuse (every day smoker) who presents to the Emergency Department complaining of shortness of breath. The patient states that she was diagnosed with pneumonia without imaging on 10/24/14, and she was placed on Doxycyline. Since then, she has continued with constant, progressively worsening shortness of breath despite taking the antibiotic as prescribed. She reports associated cough and subjective fevers. She denies chest pain, nausea, vomiting, diarrhea, abdominal pain, hematochezia, or dysuria.     REVIEW OF SYSTEMS     The pertinent positive and negative symptoms are as per HPI. All other systems reviewed and are negative.     PATIENT HISTORY     Past Medical History:  Past Medical History   Diagnosis Date    COPD (chronic obstructive pulmonary disease) ([place])     Diabetes mellitus (HCC)     HTN (hypertension)     Smoker     Degenerative joint disease involving multiple joints      Past Surgical History:  Past Surgical History   Procedure Laterality Date    Hx carpal tunnel release       Family History:  Family History   Problem Relation Age of Onset    Diabetes Mother     COPD Mother     Coronary Artery Disease Father     Diabetes Sister     Diabetes Brother     Diabetes Paternal Grandmother      Social History:  Social History   Substance Use Topics    Smoking status: Current Every Day Smoker -- 1.50 packs/day for 20 years     Types: Cigarettes    Smokeless tobacco: None    Alcohol Use: No     History   Drug Use Not on file     Medications:  Outpatient  Prescriptions Marked as Taking for the 10/30/14 encounter [facility] Encounter)   Medication Sig    acetylcysteine (MUCOMYST) 200 mg/mL (20 %) Solution 5 mL by Nebulization route Three times a day    albuterol (PROVENTIL) 2.5 mg/0.5 mL Inhalation Solution for Nebulization 2.5 mg by Nebulization route Four times a day    atorvastatin (LIPITOR) 20 mg Oral Tablet Take 1 Tab (20 mg total) by mouth Every evening    fluticasone-salmeterol (ADVAIR) 500-50 mcg/dose Inhalation Disk with Device oral diskus inhaler Take 1 INHALATION by inhalation Twice daily    ipratropium-albuterol (COMBIVENT RESPIMAT) 20-100 mcg/actuation Inhalation Aerosol Take 1 Puff by inhalation Four times a day    levalbuterol HCl (XOPENEX) 0.31 mg/3 mL Inhalation Solution for Nebulization 0.31 mg by Nebulization route Every 4 hours as needed    omeprazole (PRILOSEC) 20 mg Oral Capsule, Delayed Release(E.C.) Take 1 Cap (20 mg total) by mouth Once a day    sitaGLIPtin (JANUVIA) 100 mg Oral Tablet Take 100 mg by mouth Once a day    tiotropium bromide (SPIRIVA WITH HANDIHALER) 18 mcg Inhalation Capsule, w/Inhalation Device Take 1 Cap (18 mcg total) by inhalation Once a day  Allergies:  Allergies   Allergen Reactions    Codeine  Other Adverse Reaction (Add comment)     Sick to stomach    Hydrocodone Nausea/ Vomiting    Metformin Diarrhea    Oxycodone Nausea/ Vomiting     PHYSICAL EXAM     Vitals:  Filed Vitals:    10/30/14 1842   BP: 172/100   Pulse: 109   Temp: 37.2 C (98.9 F)   Resp: 16   SpO2: 100%     Pulse ox 100% on Nasal Cannula interpreted by me as: Normal    Constitutional: The patient is alert and oriented. Non-toxic. In no acute distress.  Head: Atraumatic, normocephalic  Eyes: Pupils equal and round, reactive to light and accomodation. Extraocular muscles intact bilaterally, conjunctiva and sclera clear  Oropharynx: Moist mucous membranes, no lesions, erythema or exudate noted.  Neck: Soft, supple, no palpatory masses,  lymphadenopathy, carotic bruits, tenderness or JVD.  Chest: Atraumatic, no tenderness to palpation, mild inspiratory and expiratory wheezing. No rhonchi or rales.  No accessory muscle use.  Cardiovascular: Heart is S1-S2, regular rate and rhythm without murmur, click, gallop or rub.  Abdomen: Soft, non-tender, non-distended without evidence of rebound or guarding, active bowel sound in four quadrants, no hepatomegaly, splenomegaly or other palpatory masses.   Skin: No cyanosis, jaundice, rash or lesion or edema.  Neurologic: Alert, oriented, normal facial symmetry and speech, Normal upper and lower extremity strength and grossly normal sensation.   Musculoskeletal: Trace pitting edema to bilateral lower extremities. No deformities, trauma, no midline or paraspinal muscle tenderness to palpation. No step-off.  Vascular: Normal peripheral pulses 2/4 with brisk capillary refills of less than 2 seconds.  Psychiatric: No SI or HI, normal affect. Normal insight. No evidence of psychosis.    DIAGNOSTIC STUDIES     Labs:    Results for orders placed or performed during the hospital encounter of 10/30/14   COMPREHENSIVE METABOLIC PROFILE - [facility] ONLY   Result Value Ref Range    SODIUM 137 136-145 mmol/L    POTASSIUM 3.7 3.4-5.1 mmol/L    CHLORIDE 95 (L) 101-111 mmol/L    CO2 TOTAL 30 22-32 mmol/L    ANION GAP 12 (H) 3-11 mmol/L    BUN 11 6-20 mg/dL    CREATININE 0.53 0.44-1.00 mg/dL    BUN/CREA RATIO 21 6-22    ESTIMATED GFR >60 >60 mL/min/1.51m2    ALBUMIN 3.7 3.5-5.0 g/dL    CALCIUM 9.3 8.6-10.3 mg/dL    GLUCOSE 249 (H) 70-110 mg/dL    ALKALINE PHOSPHATASE 80 38-126 U/L    ALT (SGPT) 37 14-54 U/L    AST (SGOT) 25 15-41 U/L    BILIRUBIN TOTAL 0.5 0.3-1.2 mg/dL    PROTEIN TOTAL 7.2 6.4-8.3 g/dL    ALBUMIN/GLOBULIN RATIO 1.1 0.8-2.0   LACTIC ACID - NOW   Result Value Ref Range    LACTIC ACID 1.7 0.5-2.2 mmol/L   PT/INR - STAT - NOW   Result Value Ref Range    PROTHROMBIN TIME 11.4 9.4-12.5 seconds    INR 1.04    PTT  (PARTIAL THROMBOPLASTIN TIME) - STAT - NOW   Result Value Ref Range    APTT 33.6 25.1-36.5 seconds   TROPONIN-I - STAT - NOW   Result Value Ref Range    TROPONIN I <0.03 <=0.06 ng/mL   URINALYSIS WITH MICROSCOPIC REFLEX IF INDICATED [facility] ONLY   Result Value Ref Range    COLOR Yellow Yellow, Light Yellow    APPEARANCE Clear  Clear    PH 6.0 <8.0    LEUKOCYTES Negative Negative WBCs/uL    NITRITE Negative Negative    PROTEIN Negative Negative mg/dL    GLUCOSE >= 500 (A) Negative mg/dL    KETONES Negative Negative mg/dL    UROBILINOGEN 4.0   (A) <=2.0 mg/dL    BILIRUBIN Negative Negative mg/dL    BLOOD Negative Negative mg/dL    SPECIFIC GRAVITY 1.010 <1.022   CBC WITH DIFF   Result Value Ref Range    WBC 17.1 (H) 4.0-11.0 x103/uL    RBC 5.05 4.00-5.10 x106/uL    HGB 15.4 12.0-15.5 g/dL    HCT 46.8 (H) 36.0-45.0 %    MCV 92.6 82.0-97.0 fL    MCH 30.4 27.5-33.2 pg    MCHC 32.9 32.0-36.0 g/dL    RDW 14.9 11.0-16.0 %    PLATELETS 300 150-450 x103/uL    MPV 8.5 7.4-10.5 fL    NEUTROPHIL % 61 43-76 %    LYMPHOCYTE % 27 15-43 %    MONOCYTE % 10 5-12 %    EOSINOPHIL % 1 0-5 %    BASOPHIL % 1 0-3 %    NEUTROPHIL # 10.50 (H) 1.50-6.50 x103/uL    LYMPHOCYTE # 4.50 1.00-4.80 x103/uL    MONOCYTE # 1.70 (H) 0.20-0.90 x103/uL    EOSINOPHIL # 0.20 0.00-0.50 x103/uL    BASOPHIL # 0.20 (H) 0.00-0.10 x103/uL     Labs reviewed and interpreted by me.    Radiology:    XR CHEST AP PORTABLE: RLL PNA.   Interpreted by me.    EKG:  12 lead EKG interpreted by me shows sinus tachycardia, rate of 108 bpm, PR interval 138, QRS 96, QTc 517, normal axis deviation, good R wave progression, no ST elevation or depression. Since PVC noted.     ED PROGRESS NOTE / [place] records reviewed by me:  Previous ED records were reviewed.    Orders Placed This Encounter    ADULT ROUTINE BLOOD CULTURE, SET OF 2 BOTTLES (BACTERIA AND YEAST)    ADULT ROUTINE BLOOD CULTURE, SET OF 2 BOTTLES (BACTERIA AND YEAST)    URINE CULTURE       XR CHEST AP PORTABLE    COMPREHENSIVE METABOLIC PROFILE - [facility] ONLY    LACTIC ACID - NOW    LACTIC ACID - IN 3 HOURS    PT/INR - STAT - NOW    PTT (PARTIAL THROMBOPLASTIN TIME) - STAT - NOW    TROPONIN-I - STAT - NOW    URINALYSIS WITH MICROSCOPIC REFLEX IF INDICATED [facility] ONLY    CBC WITH DIFF    OXYGEN - NASAL CANNULA    ECG 12-LEAD    INSERT & MAINTAIN PERIPHERAL IV ACCESS (14G or 16G: 2 SITES)    albuterol (PROVENTIL) 2.5mg / 0.5 mL nebulizer solution    And    ipratropium (ATROVENT) 0.02% nebulizer solution    NS bolus infusion 30 mL/kg = 2,859 mL    cefTRIAXone (ROCEPHIN) 1 g in iso-osmotic 50 mL duplex IVPB    azithromycin (ZITHROMAX) 500 mg in D5W 250 mL IVPB    albuterol (PROVENTIL) 2.5mg / 0.5 mL nebulizer solution    And    ipratropium (ATROVENT) 0.02% nebulizer solution    methylPREDNISolone sod succ (SOLU-MEDROL) 125 mg/2 mL injection     CXR and duo neb treatment initially ordered.     7:59 PM - I performed my initial assessment and discussed the findings with the patient.  Pulse ox in the 80's while in the waiting room. Explained results of CXR that revealed PNA. Declared sepsis due to tachycardia and hypoxic with a known infection. Blood cultures, urine culture, and lab work added to work-up to further evaluate her condition. In addition to the duo neb treatment, she will be treated with IV fluids, IVPB Rocephin, and IVPB Zithromax. The patient is in accordance with the treatment plan. I will reevaluate.      8:30 PM - Repeat duo neb treatment ordered due to continued wheezing.     9:30 PM - Results of lab work reviewed. Will proceed with admission secondary to failed outpatient treatment of PNA. Hospitalist paged. Will order IV Solu-Medrol.     9:35 PM - I discussed the patient's case and above findings with Dr. Delora Fuel [facility]) who is making arrangements to keep the patient in hospital for further workup and treatment at this time.    10:08 PM - Patient ambulated to  the bathroom, and she when she returned, she was hypoxic. Will place on BiPAP and inform Dr. Delora Fuel [facility]).     Pre-Disposition Vitals:  Filed Vitals:    10/30/14 1842 10/30/14 2045 10/30/14 2100   BP: 172/100 179/95 173/115   Pulse: 109 107 107   Temp: 37.2 C (98.9 F)     Resp: 16 24 26    SpO2: 100% 92% 91%     CLINICAL IMPRESSION     Encounter Diagnoses   Name Primary?    Community acquired pneumonia Yes    Sepsis, due to unspecified organism [facility])     Chronic obstructive pulmonary disease, unspecified COPD type ([place])     Hypoxia     COPD with acute exacerbation ([place])      Critical care time of 50 minutes, exclusive of procedures    DISPOSITION/PLAN     1. Admitted      2. Blood and urine cultures pending     Condition at Disposition: [address], SCRIBE scribed for Natalia Leatherwood, DO on 10/30/2014 at 7:55 PM.     Documentation assistance provided for Cameran Pettey, Zella Richer, DO  by Donnald Garre, SCRIBE. Information recorded by the scribe was done at my direction and has been reviewed and validated by me Dwana Garin, Zella Richer, DO.

## 2014-10-30 NOTE — ED Nurses Note (Signed)
Pt reports increasing shortness of breath today. She states that her PCP diagnosed her with pneumonia and wanted to admit her to the hospital however a chest xray was never done. She has completed her PO steroid and multiple breathing treatments today but has not yet completed the doxycycline that was prescribed.

## 2014-10-30 NOTE — ED Nurses Note (Signed)
Pt states she was told by her primary on 48 OCT 16 and told she had pneumonia and needed to be admitted and she refused. Today her shortness of breath has increased so she decided to seek help today.

## 2014-10-30 NOTE — H&P (Signed)
[facility]  [place], [place] [address]    General History and Physical    Audery, Wassenaar  Date of Admission:  10/30/2014   Date of Birth:  02-03-1955    PCP: Emelda Brothers, MD  Chief Complaint:  Shortness of breath    HPI: [name] is a 59 y.o., White female with a history of COPD on 2 liters who presents with dyspnea. The patient reports that for the past 7-10 days she has been having increasing shortness of breath as well as a cough that is occasionally productive of clear-to-yellow phlegm.  She went to her primary care doctor who diagnosed her with pneumonia and was given steroids as well as doxycycline.  She has been taking these medications; however, she continues to have worsening shortness of breath.  This shortness of breath is present at rest but it is worse when she exerts herself or she has a coughing spell.  She denies having any chest pain, fevers but does report subjective fevers and chills.  No vomiting, abdominal pain, diarrhea, melena, peripheral edema or rashes.  She came to the Emergency Department for further evaluation where she was found to have a COPD exacerbation as well as persistent pneumonia.  Her white count was 17.  She was given IV fluid bolus as well as IV antibiotics, and then admitted for further care.        Patient Active Problem List    Diagnosis Date Noted    CAP (community acquired pneumonia) 10/30/2014    Tobacco abuse 10/11/2012    Chronic respiratory failure with hypoxia ([place]) 10/11/2012    Dyspnea 10/08/2012    COPD exacerbation ([place]) 10/08/2012    Obesity (BMI 35.0-39.9 without comorbidity) 10/08/2012       Past Medical History   Diagnosis Date    COPD (chronic obstructive pulmonary disease) (HCC)     Diabetes mellitus (HCC)     HTN (hypertension)     Smoker     Degenerative joint disease involving multiple joints            Past Surgical History   Procedure Laterality Date    Hx carpal tunnel release             Medications  Prior to Admission     Prescriptions    acetylcysteine (MUCOMYST) 200 mg/mL (20 %) Solution    5 mL by Nebulization route Three times a day    albuterol (PROVENTIL) 2.5 mg/0.5 mL Inhalation Solution for Nebulization    2.5 mg by Nebulization route Four times a day    atorvastatin (LIPITOR) 20 mg Oral Tablet    Take 1 Tab (20 mg total) by mouth Every evening    fluticasone-salmeterol (ADVAIR) 500-50 mcg/dose Inhalation Disk with Device oral diskus inhaler    Take 1 INHALATION by inhalation Twice daily    ipratropium-albuterol (COMBIVENT RESPIMAT) 20-100 mcg/actuation Inhalation Aerosol    Take 1 Puff by inhalation Four times a day    levalbuterol HCl (XOPENEX) 0.31 mg/3 mL Inhalation Solution for Nebulization    0.31 mg by Nebulization route Every 4 hours as needed    omeprazole (PRILOSEC) 20 mg Oral Capsule, Delayed Release(E.C.)    Take 1 Cap (20 mg total) by mouth Once a day    sitaGLIPtin (JANUVIA) 100 mg Oral Tablet    Take 100 mg by mouth Once a day    tiotropium bromide (SPIRIVA WITH HANDIHALER) 18 mcg Inhalation Capsule, w/Inhalation Device    Take  1 Cap (18 mcg total) by inhalation Once a day          Current Facility-Administered Medications:  azithromycin (ZITHROMAX) 500 mg in D5W 250 mL IVPB 500 mg Intravenous Now   methylPREDNISolone sod succ (SOLU-MEDROL) 125 mg/2 mL injection 125 mg Intravenous Now       Allergies   Allergen Reactions    Codeine  Other Adverse Reaction (Add comment)     Sick to stomach    Hydrocodone Nausea/ Vomiting    Metformin Diarrhea    Oxycodone Nausea/ Vomiting       Social History   Substance Use Topics    Smoking status: Current Every Day Smoker -- 1.50 packs/day for 20 years     Types: Cigarettes    Smokeless tobacco: Not on file    Alcohol Use: No       Family History   Problem Relation Age of Onset    Diabetes Mother     COPD Mother     Coronary Artery Disease Father     Diabetes Sister     Diabetes Brother     Diabetes Paternal Grandmother        ROS: Other  than ROS in the HPI, all other systems were negative.    EXAM:  Temperature: 37.2 C (98.9 F)  Heart Rate: (!) 107  BP (Non-Invasive): (!) 173/115 mmHg  Respiratory Rate: (!) 26  SpO2-1: 91 %  Pain Score (Numeric, Faces): 0  General: appears in respiratory distress, obese, looks chronically ill  Eyes: Pupils equal and round, reactive to light. Anicteric  HEENT: Head atraumatic and normocephalic, MMM, poor dentition, BiPAP in place.   Neck: No JVD or thyromegaly or lymphadenopathy   Lungs: Moderate respiratory distress. Poor air movement in all lung fields. Prolonged expiratory phase. Intermittent wheezing.   Cardiovascular: tachycardic, regular rhythm, S1, S2 normal, no murmur appreciated over sounds of BiPAP   Abdomen: Soft, non-tender, non-distended, bowel sounds normal  Extremities: extremities normal, atraumatic, no cyanosis. DP pulses 2+ and equal bilaterally. 1-2+ edema bilaterally.   Skin: Skin warm and dry   Neurologic: Alert, oriented, no focal deficit, CN II-XII grossly intact  Lymphatics: No lymphadenopathy   Psychiatric: Normal affect, behavior       Labs:    I have reviewed all lab results.  Lab Results for Last 24 Hours:  Results for orders placed or performed during the hospital encounter of 10/30/14 (from the past 24 hour(s))   COMPREHENSIVE METABOLIC PROFILE - [facility] ONLY   Result Value Ref Range    SODIUM 137 136-145 mmol/L    POTASSIUM 3.7 3.4-5.1 mmol/L    CHLORIDE 95 (L) 101-111 mmol/L    CO2 TOTAL 30 22-32 mmol/L    ANION GAP 12 (H) 3-11 mmol/L    BUN 11 6-20 mg/dL    CREATININE 0.53 0.44-1.00 mg/dL    BUN/CREA RATIO 21 6-22    ESTIMATED GFR >60 >60 mL/min/1.10m2    ALBUMIN 3.7 3.5-5.0 g/dL    CALCIUM 9.3 8.6-10.3 mg/dL    GLUCOSE 249 (H) 70-110 mg/dL    ALKALINE PHOSPHATASE 80 38-126 U/L    ALT (SGPT) 37 14-54 U/L    AST (SGOT) 25 15-41 U/L    BILIRUBIN TOTAL 0.5 0.3-1.2 mg/dL    PROTEIN TOTAL 7.2 6.4-8.3 g/dL    ALBUMIN/GLOBULIN RATIO 1.1 0.8-2.0   LACTIC ACID - NOW   Result Value Ref  Range    LACTIC ACID 1.7 0.5-2.2 mmol/L   PT/INR - STAT -  NOW   Result Value Ref Range    PROTHROMBIN TIME 11.4 9.4-12.5 seconds    INR 1.04    PTT (PARTIAL THROMBOPLASTIN TIME) - STAT - NOW   Result Value Ref Range    APTT 33.6 25.1-36.5 seconds   TROPONIN-I - STAT - NOW   Result Value Ref Range    TROPONIN I <0.03 <=0.06 ng/mL   CBC WITH DIFF   Result Value Ref Range    WBC 17.1 (H) 4.0-11.0 x103/uL    RBC 5.05 4.00-5.10 x106/uL    HGB 15.4 12.0-15.5 g/dL    HCT 46.8 (H) 36.0-45.0 %    MCV 92.6 82.0-97.0 fL    MCH 30.4 27.5-33.2 pg    MCHC 32.9 32.0-36.0 g/dL    RDW 14.9 11.0-16.0 %    PLATELETS 300 150-450 x103/uL    MPV 8.5 7.4-10.5 fL    NEUTROPHIL % 61 43-76 %    LYMPHOCYTE % 27 15-43 %    MONOCYTE % 10 5-12 %    EOSINOPHIL % 1 0-5 %    BASOPHIL % 1 0-3 %    NEUTROPHIL # 10.50 (H) 1.50-6.50 x103/uL    LYMPHOCYTE # 4.50 1.00-4.80 x103/uL    MONOCYTE # 1.70 (H) 0.20-0.90 x103/uL    EOSINOPHIL # 0.20 0.00-0.50 x103/uL    BASOPHIL # 0.20 (H) 0.00-0.10 x103/uL   URINALYSIS WITH MICROSCOPIC REFLEX IF INDICATED [facility] ONLY   Result Value Ref Range    COLOR Yellow Yellow, Light Yellow    APPEARANCE Clear Clear    PH 6.0 <8.0    LEUKOCYTES Negative Negative WBCs/uL    NITRITE Negative Negative    PROTEIN Negative Negative mg/dL    GLUCOSE >= 500 (A) Negative mg/dL    KETONES Negative Negative mg/dL    UROBILINOGEN 4.0   (A) <=2.0 mg/dL    BILIRUBIN Negative Negative mg/dL    BLOOD Negative Negative mg/dL    SPECIFIC GRAVITY 1.010 <1.022       Imaging Studies:    CXR: per my read - RLL pneumonia    ECG: per my read - ST, HR 108, normal axis, no pathologic Q-waves, no ischemic ST-TW changes.     Assessment/Plan:   [name] is a 59 y.o., White female who presents with the following:    1. Acute on Chronic Respiratory Failure with Hypoxia: will continue BiPAP for now and treat her COPD exacerbation as noted below. If she worsens, will check ABG. Will also check BNP.     2. CAP: located in her right lower lobe.  Will continue IV Unasyn and Zithromax. Check urine strep/legionella antigens. Blood and sputum cultures ordered.     3. COPD with Acute Exacerbation: will place on IV Solumedrol, Duonebs with PRN albuterol and continue home inhalers.     4. Severe Sepsis: (sepsis + hypoxia). Already received 30 ml/kg bolus of IV fluids. Will continue IV antibiotics and monitor culture data. Repeat lactic acid ordered.     5. Type II DM with Hyperglycemia: will hold Januvia and place on basal insulin protocol. Uncontrolled.     6. Tobacco Use Disorder: counseled on cessation. Declined nicoderm.     7. HLD: continue Lipitor.     8. GERD: continue Prilosec.     9. Obesity: Body mass index is 39.71 kg/(m^2). Encourage weight loss.     DVT PPx: Lovenox  Code Status:  FULL    Margarette Asal, MD       Portions of this note  may be dictated using voice recognition software or a dictation service. Variances in spelling and vocabulary are possible and unintentional. Not all errors are caught/corrected. Please notify the [facility] if any discrepancies are noted or if the meaning of any statement is not clear.

## 2014-10-30 NOTE — ED Nurses Note (Signed)
Three missed IV attempts. Primary nurse notified.

## 2014-10-31 LAB — CBC WITH DIFF
BASOPHIL #: 0 x10ˆ3/uL (ref 0.00–0.10)
BASOPHIL %: 0 % (ref 0–3)
EOSINOPHIL #: 0.1 x10ˆ3/uL (ref 0.00–0.50)
EOSINOPHIL %: 0 % (ref 0–5)
HCT: 43.9 % (ref 36.0–45.0)
HGB: 14.5 g/dL (ref 12.0–15.5)
LYMPHOCYTE #: 0.8 x10ˆ3/uL — ABNORMAL LOW (ref 1.00–4.80)
LYMPHOCYTE %: 5 % — ABNORMAL LOW (ref 15–43)
LYMPHOCYTE %: 5 % — ABNORMAL LOW (ref 15–43)
MCH: 30.8 pg (ref 27.5–33.2)
MCHC: 33 g/dL (ref 32.0–36.0)
MCV: 93.3 fL (ref 82.0–97.0)
MONOCYTE #: 0.2 x10ˆ3/uL (ref 0.20–0.90)
MONOCYTE %: 1 % — ABNORMAL LOW (ref 5–12)
MPV: 8.7 fL (ref 7.4–10.5)
NEUTROPHIL #: 16.7 x10ˆ3/uL — ABNORMAL HIGH (ref 1.50–6.50)
NEUTROPHIL %: 94 % — ABNORMAL HIGH (ref 43–76)
PLATELETS: 265 x10ˆ3/uL (ref 150–450)
RBC: 4.71 x10?6/uL (ref 4.00–5.10)
RDW: 14.8 % (ref 11.0–16.0)
WBC: 17.9 x10ˆ3/uL — ABNORMAL HIGH (ref 4.0–11.0)

## 2014-10-31 LAB — COMPREHENSIVE METABOLIC PROFILE - BMC/JMC ONLY
ALBUMIN/GLOBULIN RATIO: 1.2 (ref 0.8–2.0)
ALKALINE PHOSPHATASE: 69 U/L (ref 38–126)
ANION GAP: 4 mmol/L (ref 3–11)
AST (SGOT): 28 U/L (ref 15–41)
BUN/CREA RATIO: 16 (ref 6–22)
CALCIUM: 8.2 mg/dL — ABNORMAL LOW (ref 8.6–10.3)
CHLORIDE: 104 mmol/L (ref 101–111)
CO2 TOTAL: 26 mmol/L (ref 22–32)
CREATININE: 0.58 mg/dL (ref 0.44–1.00)
ESTIMATED GFR: >60 mL/min/1.73mˆ2 (ref >60)
GLUCOSE: 311 mg/dL — ABNORMAL HIGH (ref 70–110)
PROTEIN TOTAL: 6 g/dL — ABNORMAL LOW (ref 6.4–8.3)

## 2014-10-31 LAB — POC FINGERSTICK GLUCOSE - BMC/JMC (RESULTS)
GLUCOSE, POC: 222 mg/dL — ABNORMAL HIGH (ref 60–100)
GLUCOSE, POC: 239 mg/dL — ABNORMAL HIGH (ref 60–100)
GLUCOSE, POC: 309 mg/dL — ABNORMAL HIGH (ref 60–100)
GLUCOSE, POC: 321 mg/dL — ABNORMAL HIGH (ref 60–100)

## 2014-10-31 LAB — HGA1C (HEMOGLOBIN A1C WITH EST AVG GLUCOSE)
ESTIMATED AVERAGE GLUCOSE: 260 mg/dL — ABNORMAL HIGH (ref 70–110)
GLYCOHEMOGLOBIN (HBA1C): 10.7 % — ABNORMAL HIGH (ref 4.0–6.0)

## 2014-10-31 LAB — LACTIC ACID LEVEL: LACTIC ACID: 1.2 mmol/L (ref 0.5–2.2)

## 2014-10-31 LAB — ECG 12-LEAD
Atrial Rate: 108 {beats}/min
Calculated R Axis: 64 degrees
Calculated R Axis: 64 degrees
Calculated T Axis: 58 degrees
PR Interval: 138 ms
QRS Duration: 96 ms
QT Interval: 386 ms
Ventricular rate: 108 {beats}/min

## 2014-10-31 LAB — TROPONIN-I: TROPONIN I: <0.03 ng/mL (ref <=0.06)

## 2014-10-31 LAB — B-TYPE NATRIURETIC PEPTIDE (BNP),PLASMA: BNP: 250 pg/mL — ABNORMAL HIGH (ref 0.0–100.0)

## 2014-10-31 MED ORDER — DILTIAZEM CD 120 MG CAPSULE,EXTENDED RELEASE 24 HR
120.0000 mg | ORAL_CAPSULE | Freq: Every day | ORAL | Status: DC
Start: 2014-10-31 — End: 2014-11-02
  Administered 2014-10-31 – 2014-11-02 (×3): 120 mg via ORAL
  Filled 2014-10-31 (×3): qty 1

## 2014-10-31 MED ORDER — HYDRALAZINE 20 MG/ML INJECTION SOLUTION
10.00 mg | INTRAMUSCULAR | Status: DC | PRN
Start: 2014-10-31 — End: 2014-11-02
  Administered 2014-10-31: 10 mg via INTRAVENOUS
  Filled 2014-10-31: qty 1

## 2014-10-31 MED ORDER — HYDROCHLOROTHIAZIDE 25 MG TABLET
25.00 mg | ORAL_TABLET | Freq: Every day | ORAL | Status: DC
Start: 2014-10-31 — End: 2014-11-02
  Administered 2014-10-31 – 2014-11-02 (×3): 25 mg via ORAL
  Filled 2014-10-31 (×3): qty 1

## 2014-10-31 MED ORDER — LISINOPRIL 10 MG TABLET
10.0000 mg | ORAL_TABLET | Freq: Every day | ORAL | Status: DC
Start: 2014-10-31 — End: 2014-11-02
  Administered 2014-10-31 – 2014-11-02 (×3): 10 mg via ORAL
  Filled 2014-10-31 (×3): qty 1

## 2014-10-31 MED ADMIN — nystatin 100,000 unit/gram topical powder: SUBCUTANEOUS | NDC 00574200815

## 2014-10-31 MED ADMIN — Medication: INTRAVENOUS | @ 01:00:00

## 2014-10-31 MED ADMIN — sodium chloride 0.9 % intravenous solution: RESPIRATORY_TRACT | NDC 00338004904

## 2014-10-31 MED ADMIN — POTASSIUM CHLORIDE 10 MEQ WITH LIDOCAINE 1% IN NS 100ML IVPB: INTRAVENOUS | @ 06:00:00 | NDC 00409665318

## 2014-10-31 NOTE — Nurses Notes (Signed)
Pt arrived to unit via stretcher. Ambulated independently to bed. Placed on 4L NC. Given BSC for bathroom needs. Assessment to bed completed (see flowsheets). Call bell within reach, will continue to monitor.

## 2014-10-31 NOTE — Progress Notes (Signed)
[facility]  [place], [place] [address]    INPATIENT PROGRESS NOTE      [name],[name]  Date of Admission:  10/30/2014  Date of Birth:  08-25-1955  Date of Service:  10/31/2014    Chief Complaint: Better      Subjective: Pt seen. Admits above.      ROS: Denies fever/chills. No chest pain. Still with SOB. No abdominal pain, nausea, or vomiting.       Current Medications:    Current Facility-Administered Medications:  acetylcysteine (MUCOMYST) 20% nebulized solution 4 mL Nebulization 3x/day   albuterol (PROVENTIL) 2.5mg / 0.5 mL nebulizer solution 2.5 mg Nebulization Q4H   And      ipratropium (ATROVENT) 0.02% nebulizer solution 0.5 mg Nebulization Q4H   albuterol (PROVENTIL) 2.5mg / 0.5 mL nebulizer solution 2.5 mg Nebulization Q4H PRN   ampicillin-sulbactam (UNASYN) 3 g in NS 100 mL IVPB 3 g Intravenous Q6H   atorvastatin (LIPITOR) tablet 20 mg Oral QPM   azithromycin (ZITHROMAX) 500 mg in D5W 250 mL IVPB 500 mg Intravenous Q24H   SSIP insulin lispro (HUMALOG) 100 units/mL injection 1-8 Units Subcutaneous 4x/day AC   And      dextrose 50% (0.5 g/mL) injection 12.5 g Intravenous Q15 Min PRN   diltiaZEM (CARDIZEM CD) 24 hr extended release capsule 120 mg Oral Daily   fluticasone-salmeterol (ADVAIR) 500 mcg-50 mcg per inhalation oral inhaler 1 INHALATION Inhalation 2x/day   hydrALAZINE (APRESOLINE) injection 10 mg 10 mg Intravenous Q4H PRN   hydroCHLOROthiazide (HYDRODIURIL) tablet 25 mg Oral Daily   insulin glargine (LANTUS) 100 units/mL injection 10 Units Subcutaneous NIGHTLY   lisinopril (PRINIVIL) tablet 10 mg Oral Daily   methylPREDNISolone sod succ (SOLU-MEDROL) 40 mg/mL injection 40 mg Intravenous Q12H   omeprazole (PRILOSEC) capsule 20 mg Oral Daily       Today's Physical Exam:  BP 180/90 mmHg   Pulse 98   Temp(Src) 36.4 C (97.5 F)   Resp 20   Ht 1.6 m (5\' 3" )   Wt 102.967 kg (227 lb)   BMI 40.22 kg/m2   SpO2 93%  General: AAOx3, Obese, No acute distress.   Eyes: Pupils equal and  round, reactive to light and accomodation.   HENT:Head atraumatic and normocephalic   Neck: No JVD or thyromegaly or lymphadenopathy   Lungs: Crackles at lung bases to auscultation bilaterally.   Cardiovascular: regular rate and rhythm, S1, S2 normal, no murmur,   Abdomen: Obese, Soft, non-tender, Bowel sounds normal, No hepatosplenomegaly   Extremities: extremities normal, atraumatic, no cyanosis or edema   Skin: Skin warm and dry   Neurologic: Grossly normal   Lymphatics: No lymphadenopathy   Psychiatric: Normal affect and behavior.        I/O:  I/O last 24 hours:    Intake/Output Summary (Last 24 hours) at 10/31/14 0904  Last data filed at 10/31/14 0600   Gross per 24 hour   Intake    290 ml   Output   1400 ml   Net  -1110 ml     I/O current shift:         Laboratory Results:    Reviewed:   Results for orders placed or performed during the hospital encounter of 10/30/14 (from the past 24 hour(s))   COMPREHENSIVE METABOLIC PROFILE - [facility] ONLY   Result Value Ref Range    SODIUM 137 136-145 mmol/L    POTASSIUM 3.7 3.4-5.1 mmol/L    CHLORIDE 95 (L) 101-111 mmol/L  CO2 TOTAL 30 22-32 mmol/L    ANION GAP 12 (H) 3-11 mmol/L    BUN 11 6-20 mg/dL    CREATININE 0.53 0.44-1.00 mg/dL    BUN/CREA RATIO 21 6-22    ESTIMATED GFR >60 >60 mL/min/1.62m2    ALBUMIN 3.7 3.5-5.0 g/dL    CALCIUM 9.3 8.6-10.3 mg/dL    GLUCOSE 249 (H) 70-110 mg/dL    ALKALINE PHOSPHATASE 80 38-126 U/L    ALT (SGPT) 37 14-54 U/L    AST (SGOT) 25 15-41 U/L    BILIRUBIN TOTAL 0.5 0.3-1.2 mg/dL    PROTEIN TOTAL 7.2 6.4-8.3 g/dL    ALBUMIN/GLOBULIN RATIO 1.1 0.8-2.0   LACTIC ACID - NOW   Result Value Ref Range    LACTIC ACID 1.7 0.5-2.2 mmol/L   PT/INR - STAT - NOW   Result Value Ref Range    PROTHROMBIN TIME 11.4 9.4-12.5 seconds    INR 1.04    PTT (PARTIAL THROMBOPLASTIN TIME) - STAT - NOW   Result Value Ref Range    APTT 33.6 25.1-36.5 seconds   TROPONIN-I - STAT - NOW   Result Value Ref Range    TROPONIN I <0.03 <=0.06 ng/mL   CBC WITH DIFF      Result Value Ref Range    WBC 17.1 (H) 4.0-11.0 x103/uL    RBC 5.05 4.00-5.10 x106/uL    HGB 15.4 12.0-15.5 g/dL    HCT 46.8 (H) 36.0-45.0 %    MCV 92.6 82.0-97.0 fL    MCH 30.4 27.5-33.2 pg    MCHC 32.9 32.0-36.0 g/dL    RDW 14.9 11.0-16.0 %    PLATELETS 300 150-450 x103/uL    MPV 8.5 7.4-10.5 fL    NEUTROPHIL % 61 43-76 %    LYMPHOCYTE % 27 15-43 %    MONOCYTE % 10 5-12 %    EOSINOPHIL % 1 0-5 %    BASOPHIL % 1 0-3 %    NEUTROPHIL # 10.50 (H) 1.50-6.50 x103/uL    LYMPHOCYTE # 4.50 1.00-4.80 x103/uL    MONOCYTE # 1.70 (H) 0.20-0.90 x103/uL    EOSINOPHIL # 0.20 0.00-0.50 x103/uL    BASOPHIL # 0.20 (H) 0.00-0.10 x103/uL   B-TYPE NATRIURETIC PEPTIDE   Result Value Ref Range    BNP 250.0 (H) 0.0-100.0 pg/mL   URINALYSIS WITH MICROSCOPIC REFLEX IF INDICATED [facility] ONLY   Result Value Ref Range    COLOR Yellow Yellow, Light Yellow    APPEARANCE Clear Clear    PH 6.0 <8.0    LEUKOCYTES Negative Negative WBCs/uL    NITRITE Negative Negative    PROTEIN Negative Negative mg/dL    GLUCOSE >= 500 (A) Negative mg/dL    KETONES Negative Negative mg/dL    UROBILINOGEN 4.0   (A) <=2.0 mg/dL    BILIRUBIN Negative Negative mg/dL    BLOOD Negative Negative mg/dL    SPECIFIC GRAVITY 1.010 <1.022   POC FINGERSTICK GLUCOSE - [facility] RESULTS (RESULTS)   Result Value Ref Range    GLUCOSE, POC 239 (H) 60-100 mg/dL   LACTIC ACID - IN 3 HOURS   Result Value Ref Range    LACTIC ACID 1.2 0.5-2.2 mmol/L   COMPREHENSIVE METABOLIC PROFILE - [facility] ONLY   Result Value Ref Range    SODIUM 134 (L) 136-145 mmol/L    POTASSIUM 4.7 3.4-5.1 mmol/L    CHLORIDE 104 101-111 mmol/L    CO2 TOTAL 26 22-32 mmol/L    ANION GAP 4 3-11 mmol/L    BUN 9 6-20 mg/dL  CREATININE 0.58 0.44-1.00 mg/dL    BUN/CREA RATIO 16 6-22    ESTIMATED GFR >60 >60 mL/min/1.82m2    ALBUMIN 3.3 (L) 3.5-5.0 g/dL    CALCIUM 8.2 (L) 8.6-10.3 mg/dL    GLUCOSE 311 (H) 70-110 mg/dL    ALKALINE PHOSPHATASE 69 38-126 U/L    ALT (SGPT) 33 14-54 U/L    AST (SGOT) 28 15-41 U/L     BILIRUBIN TOTAL 1.1 0.3-1.2 mg/dL    PROTEIN TOTAL 6.0 (L) 6.4-8.3 g/dL    ALBUMIN/GLOBULIN RATIO 1.2 0.8-2.0   CBC WITH DIFF   Result Value Ref Range    WBC 17.9 (H) 4.0-11.0 x103/uL    RBC 4.71 4.00-5.10 x106/uL    HGB 14.5 12.0-15.5 g/dL    HCT 43.9 36.0-45.0 %    MCV 93.3 82.0-97.0 fL    MCH 30.8 27.5-33.2 pg    MCHC 33.0 32.0-36.0 g/dL    RDW 14.8 11.0-16.0 %    PLATELETS 265 150-450 x103/uL    MPV 8.7 7.4-10.5 fL    NEUTROPHIL % 94 (H) 43-76 %    LYMPHOCYTE % 5 (L) 15-43 %    MONOCYTE % 1 (L) 5-12 %    EOSINOPHIL % 0 0-5 %    BASOPHIL % 0 0-3 %    NEUTROPHIL # 16.70 (H) 1.50-6.50 x103/uL    LYMPHOCYTE # 0.80 (L) 1.00-4.80 x103/uL    MONOCYTE # 0.20 0.20-0.90 x103/uL    EOSINOPHIL # 0.10 0.00-0.50 x103/uL    BASOPHIL # 0.00 0.00-0.10 x103/uL   TROPONIN-I   Result Value Ref Range    TROPONIN I <0.03 <=0.06 ng/mL   POC FINGERSTICK GLUCOSE - [facility] RESULTS (RESULTS)   Result Value Ref Range    GLUCOSE, POC 275 (H) 60-100 mg/dL     Ordered:    Lab orders   No lab orders         Radiological Studies:  Reviewed:   Results for orders placed or performed during the hospital encounter of 10/30/14 (from the past 24 hour(s))   XR CHEST AP PORTABLE     Status: None    Narrative    Radiologist:  Theressa Millard, MD               Procedure:  XR CHEST AP PORTABLE     History:  respiratory difficulty     Comparison radiograph: none    The heart and vessels are unremarkable.  The lungs are hyperinflated but   clear.  The costophrenic angles are sharp.  No abnormalities are seen on   limited evaluation of the bones.     Impression:  No acute cardiopulmonary disease. Emphysematous lungs.     Ordered:  XR CHEST AP PORTABLE    Problem List:  Active Hospital Problems   (*Primary Problem)    Diagnosis    *CAP (community acquired pneumonia)    Morbid obesity       Assessment/ Plan:       Acute on Chronic Respiratory Failure with Hypoxia: Clinically better, Continue will continue BiPAP. Nebs. Abts.       CAP: RLL. Oxygen.  Nebs. Empiric IV Unasyn and Zithromax. Check urine strep/legionella antigens. Blood and sputum cultures ordered.       COPD with Acute Exacerbation: Clinically better. Continue on IV Solumedrol, Duonebs with PRN albuterol and continue home inhalers.       Severe Sepsis: (sepsis + hypoxia). Continue current regimen. Follow BCx/Spu Cx. Leg/Pneumo ag's. IVF.      Type II DM with  Hyperglycemia: will hold Januvia and place on basal insulin protocol. Uncontrolled.       Tobacco Use Disorder: counseled on cessation. Declined nicoderm.       HLD: continue Lipitor.       GERD: continue Prilosec.       Obesity: Body mass index is 39.71 kg/(m^2). Encourage weight loss.       Prophylaxis: Lovenox.       Disposition: Home in 2-3 days      Peter Garter, MD, 10/31/2014,  09:04

## 2014-10-31 NOTE — Nurses Notes (Signed)
Pt's BP manually 180/90. MD paged.

## 2014-10-31 NOTE — Consults (Signed)
Met with patient per consult for PHQ-9 screening results. Engaged patient in appropriate interventions for Depression and identified symptoms.     Pt states that he depression is mainly due to her family situation.  Pt said that she is prescribed an antidepressant by her PCP but doesn't feel it is working.  Pt agrees that therapy would help but reports having difficulty with being able to afford co-pays.   Discussed with pt the Depression Support Group in the area and pt is interested.  Pt will be provided with support group information.

## 2014-10-31 NOTE — Care Management Notes (Signed)
Met with Mrs. Roderick for d/c planning. Lives with husband. He works for a [employer] in [place] as has for 21 years. Drives back and forth. They get up at 2:30am as he has to be there by 5am. She stated she has to awaken him. Inquired about an alarm clock. She stated he did not know how to set them. They moved to our area, [facility]. Extended, a few years ago. Has a niece here.  She still sees her PCP in [place], Dr. Emelda Brothers, and pharmacy of choice is [facility] in [place] ( [place]).  She does drive.   Raised her niece's son and daughter as her own. They are 23 and 21. The son is in prison in [place] and has been for 4 yrs. For parole in Dec., but having no d/c residence lined up, doubts he will be paroled. Stated he does not want to return to her home. Has not seen him for about a year because he always wants money, which they do not have. Said he wants 1-2 thousand dollars. Inquired what for. She stated it was for tv and computer entertainment. She then stated the daughter, who is in [employer]     10/31/14 1700   Assessment Detail   Assessment Type Admission   Social Work Plan   Discharge Planning Status initial meeting   Anticipated Discharge Disposition Home   Discharge Needs Assessment   Concerns To Be Addressed no discharge needs identified;denies needs/concerns at this time   Equipment Currently Used at Home none   Transportation Available (YES)   Referral Information   Admission Type inpatient   Source of Information Patient   Referral Source admission list   Reason For Consult discharge planning   Record Reviewed medical record   Current Health   Services Anticipated at Discharge none    was being evicted and she was likely to have to help her. She volunteered the niece delivered the son when she was a prisoner and then became pregnant in prison with the daughter.She was her only support.  Home o2 is thru [place]. (+) neb. (+) glucometer and strips.  No h/o HH and declines need  for it on d/c.

## 2014-11-01 LAB — CBC WITH DIFF
BASOPHIL #: 0.2 x10ˆ3/uL — ABNORMAL HIGH (ref 0.00–0.10)
BASOPHIL %: 1 % (ref 0–3)
EOSINOPHIL #: 0 x10ˆ3/uL (ref 0.00–0.50)
EOSINOPHIL %: 0 % (ref 0–5)
HCT: 45.6 % — ABNORMAL HIGH (ref 36.0–45.0)
HGB: 14.6 g/dL (ref 12.0–15.5)
LYMPHOCYTE #: 2.6 x10ˆ3/uL (ref 1.00–4.80)
LYMPHOCYTE %: 13 % — ABNORMAL LOW (ref 15–43)
MCH: 30.2 pg (ref 27.5–33.2)
MCHC: 32 g/dL (ref 32.0–36.0)
MCV: 94.2 fL (ref 82.0–97.0)
MONOCYTE #: 1.6 x10ˆ3/uL — ABNORMAL HIGH (ref 0.20–0.90)
MONOCYTE %: 8 % (ref 5–12)
MPV: 8.5 fL (ref 7.4–10.5)
NEUTROPHIL #: 16.5 x10ˆ3/uL — ABNORMAL HIGH (ref 1.50–6.50)
NEUTROPHIL %: 79 % — ABNORMAL HIGH (ref 43–76)
PLATELETS: 289 x10ˆ3/uL (ref 150–450)
RBC: 4.84 x10ˆ6/uL (ref 4.00–5.10)
RDW: 14.4 % (ref 11.0–16.0)
WBC: 21 x10ˆ3/uL — ABNORMAL HIGH (ref 4.0–11.0)

## 2014-11-01 LAB — STREPTOCOCCUS PNEUMONIAE ANTIGEN,URINE: S.PNEUMONIA ANTIGEN: NEGATIVE

## 2014-11-01 LAB — BASIC METABOLIC PANEL
ANION GAP: 6 mmol/L (ref 3–11)
BUN/CREA RATIO: 18 (ref 6–22)
BUN: 11 mg/dL (ref 6–20)
CALCIUM: 9.2 mg/dL (ref 8.6–10.3)
CHLORIDE: 98 mmol/L — ABNORMAL LOW (ref 101–111)
CO2 TOTAL: 33 mmol/L — ABNORMAL HIGH (ref 22–32)
CREATININE: 0.62 mg/dL (ref 0.44–1.00)
ESTIMATED GFR: >60 mL/min/1.73mˆ2 (ref >60)
GLUCOSE: 294 mg/dL — ABNORMAL HIGH (ref 70–110)
POTASSIUM: 4 mmol/L (ref 3.4–5.1)
SODIUM: 137 mmol/L (ref 136–145)

## 2014-11-01 LAB — URINE CULTURE: URINE CULTURE: 10000 — AB

## 2014-11-01 LAB — POC FINGERSTICK GLUCOSE - BMC/JMC (RESULTS)
GLUCOSE, POC: 328 mg/dL — ABNORMAL HIGH (ref 60–100)
GLUCOSE, POC: 332 mg/dL — ABNORMAL HIGH (ref 60–100)
GLUCOSE, POC: 328 mg/dL — ABNORMAL HIGH (ref 60–100)

## 2014-11-01 LAB — LEGIONELLA URINE ANTIGEN: LEGIONELLA ANTIGEN: NEGATIVE

## 2014-11-01 MED ADMIN — atorvastatin 20 mg tablet: ORAL | @ 21:00:00

## 2014-11-01 MED ADMIN — albuterol sulfate concentrate 2.5 mg/0.5 mL solution for nebulization: RESPIRATORY_TRACT | @ 08:00:00

## 2014-11-01 MED ADMIN — nystatin 100,000 unit/gram topical cream: SUBCUTANEOUS | @ 21:00:00 | NDC 51672128901

## 2014-11-01 MED ADMIN — SODIUM CHLORIDE 0.9 % W/ ADDITIVES: SUBCUTANEOUS | @ 21:00:00 | NDC 00338004904

## 2014-11-01 MED ADMIN — lactated Ringers intravenous solution: RESPIRATORY_TRACT | @ 20:00:00 | NDC 00338011704

## 2014-11-01 NOTE — Progress Notes (Signed)
[facility]  [place], [place] [address]    INPATIENT PROGRESS NOTE      [name],[name]  Date of Admission:  10/30/2014  Date of Birth:  1955/12/19  Date of Service:  11/01/2014    Chief Complaint: Better      Subjective: Pt seen. Admits above.      ROS: Denies fever/chills. No chest pain. Still with SOB. No abdominal pain, nausea, or vomiting.       Current Medications:    Current Facility-Administered Medications:  albuterol (PROVENTIL) 2.5mg / 0.5 mL nebulizer solution 2.5 mg Nebulization Q4H   And      ipratropium (ATROVENT) 0.02% nebulizer solution 0.5 mg Nebulization Q4H   albuterol (PROVENTIL) 2.5mg / 0.5 mL nebulizer solution 2.5 mg Nebulization Q4H PRN   ampicillin-sulbactam (UNASYN) 3 g in NS 100 mL IVPB 3 g Intravenous Q6H   atorvastatin (LIPITOR) tablet 20 mg Oral QPM   azithromycin (ZITHROMAX) 500 mg in D5W 250 mL IVPB 500 mg Intravenous Q24H   SSIP insulin lispro (HUMALOG) 100 units/mL injection 1-8 Units Subcutaneous 4x/day AC   And      dextrose 50% (0.5 g/mL) injection 12.5 g Intravenous Q15 Min PRN   diltiaZEM (CARDIZEM CD) 24 hr extended release capsule 120 mg Oral Daily   fluticasone-salmeterol (ADVAIR) 500 mcg-50 mcg per inhalation oral inhaler 1 INHALATION Inhalation 2x/day   hydrALAZINE (APRESOLINE) injection 10 mg 10 mg Intravenous Q4H PRN   hydroCHLOROthiazide (HYDRODIURIL) tablet 25 mg Oral Daily   insulin glargine (LANTUS) 100 units/mL injection 10 Units Subcutaneous NIGHTLY   lisinopril (PRINIVIL) tablet 10 mg Oral Daily   methylPREDNISolone sod succ (SOLU-MEDROL) 40 mg/mL injection 40 mg Intravenous Q12H   omeprazole (PRILOSEC) capsule 20 mg Oral Daily       Today's Physical Exam:  BP 155/68 mmHg   Pulse 95   Temp(Src) 36.5 C (97.7 F)   Resp 20   Ht 1.6 m (5\' 3" )   Wt 101.969 kg (224 lb 12.8 oz)   BMI 39.83 kg/m2   SpO2 99%  General: AAOx3, Obese, No acute distress.   Eyes: Pupils equal and round, reactive to light and accomodation.   HENT:Head atraumatic and  normocephalic   Neck: No JVD or thyromegaly or lymphadenopathy   Lungs: Crackles at lung bases to auscultation bilaterally.   Cardiovascular: regular rate and rhythm, S1, S2 normal, no murmur,   Abdomen: Obese, Soft, non-tender, Bowel sounds normal, No hepatosplenomegaly   Extremities: extremities normal, atraumatic, no cyanosis or edema   Skin: Skin warm and dry   Neurologic: Grossly normal   Lymphatics: No lymphadenopathy   Psychiatric: Normal affect and behavior.        I/O:  I/O last 24 hours:      Intake/Output Summary (Last 24 hours) at 11/01/14 1552  Last data filed at 11/01/14 1400   Gross per 24 hour   Intake    510 ml   Output   2000 ml   Net  -1490 ml     I/O current shift:  10/22 0800 - 10/22 1559  In: 480 [P.O.:480]  Out: -       Laboratory Results:    Reviewed:   Results for orders placed or performed during the hospital encounter of 10/30/14 (from the past 24 hour(s))   POC FINGERSTICK GLUCOSE - [facility] RESULTS (RESULTS)   Result Value Ref Range    GLUCOSE, POC 309 (H) 60-100 mg/dL   POC FINGERSTICK GLUCOSE - [facility] RESULTS (RESULTS)  Result Value Ref Range    GLUCOSE, POC 222 (H) 60-100 mg/dL   BASIC METABOLIC PANEL   Result Value Ref Range    SODIUM 137 136-145 mmol/L    POTASSIUM 4.0 3.4-5.1 mmol/L    CHLORIDE 98 (L) 101-111 mmol/L    CO2 TOTAL 33 (H) 22-32 mmol/L    ANION GAP 6 3-11 mmol/L    CALCIUM 9.2 8.6-10.3 mg/dL    GLUCOSE 294 (H) 70-110 mg/dL    BUN 11 6-20 mg/dL    CREATININE 0.62 0.44-1.00 mg/dL    BUN/CREA RATIO 18 6-22    ESTIMATED GFR >60 >60 mL/min/1.25m2   CBC WITH DIFF   Result Value Ref Range    WBC 21.0 (H) 4.0-11.0 x103/uL    RBC 4.84 4.00-5.10 x106/uL    HGB 14.6 12.0-15.5 g/dL    HCT 45.6 (H) 36.0-45.0 %    MCV 94.2 82.0-97.0 fL    MCH 30.2 27.5-33.2 pg    MCHC 32.0 32.0-36.0 g/dL    RDW 14.4 11.0-16.0 %    PLATELETS 289 150-450 x103/uL    MPV 8.5 7.4-10.5 fL    NEUTROPHIL % 79 (H) 43-76 %    LYMPHOCYTE % 13 (L) 15-43 %    MONOCYTE % 8 5-12 %    EOSINOPHIL % 0 0-5 %     BASOPHIL % 1 0-3 %    NEUTROPHIL # 16.50 (H) 1.50-6.50 x103/uL    LYMPHOCYTE # 2.60 1.00-4.80 x103/uL    MONOCYTE # 1.60 (H) 0.20-0.90 x103/uL    EOSINOPHIL # 0.00 0.00-0.50 x103/uL    BASOPHIL # 0.20 (H) 0.00-0.10 x103/uL   POC FINGERSTICK GLUCOSE - [facility] RESULTS (RESULTS)   Result Value Ref Range    GLUCOSE, POC 296 (H) 60-100 mg/dL   POC FINGERSTICK GLUCOSE - [facility] RESULTS (RESULTS)   Result Value Ref Range    GLUCOSE, POC 328 (H) 60-100 mg/dL     Ordered:    Lab orders   No lab orders         Radiological Studies:  Reviewed:      Ordered:  XR CHEST AP PORTABLE    Problem List:  Active Hospital Problems   (*Primary Problem)    Diagnosis    *CAP (community acquired pneumonia)    Morbid obesity       Assessment/ Plan:       Acute on Chronic Respiratory Failure with Hypoxia: Clinically better, Continue will continue BiPAP. Nebs. Abts.       CAP: RLL. Oxygen. Nebs. Empiric IV Unasyn and Zithromax. Check urine strep/legionella antigens. Blood and sputum cultures ordered.       COPD with Acute Exacerbation: Clinically better. Continue on IV Solumedrol, Duonebs with PRN albuterol and continue home inhalers.       Severe Sepsis: (sepsis + hypoxia). Continue current regimen. Follow BCx/Spu Cx. Leg/Pneumo ag's. IVF.      Type II DM with Hyperglycemia: will hold Januvia and place on basal insulin protocol. Uncontrolled.       Tobacco Use Disorder: counseled on cessation. Declined nicoderm.       HLD: continue Lipitor.       GERD: continue Prilosec.       Obesity: Body mass index is 39.71 kg/(m^2). Encourage weight loss.       Prophylaxis: Lovenox.       Disposition: Home in 1-2 days      Peter Garter, MD, 11/01/2014,  15:52

## 2014-11-02 LAB — CBC WITH DIFF
BASOPHIL #: 0.1 x10ˆ3/uL (ref 0.00–0.10)
BASOPHIL %: 1 % (ref 0–3)
EOSINOPHIL #: 0 x10ˆ3/uL (ref 0.00–0.50)
EOSINOPHIL %: 0 % (ref 0–5)
HCT: 44.7 % (ref 36.0–45.0)
HGB: 14.6 g/dL (ref 12.0–15.5)
LYMPHOCYTE #: 1.6 x10ˆ3/uL (ref 1.00–4.80)
LYMPHOCYTE %: 9 % — ABNORMAL LOW (ref 15–43)
MCH: 30.4 pg (ref 27.5–33.2)
MCHC: 32.5 g/dL (ref 32.0–36.0)
MCV: 93.6 fL (ref 82.0–97.0)
MONOCYTE #: 0.9 x10ˆ3/uL (ref 0.20–0.90)
MONOCYTE %: 5 % (ref 5–12)
MPV: 8.7 fL (ref 7.4–10.5)
NEUTROPHIL #: 14.8 x10ˆ3/uL — ABNORMAL HIGH (ref 1.50–6.50)
NEUTROPHIL %: 85 % — ABNORMAL HIGH (ref 43–76)
PLATELETS: 285 x10ˆ3/uL (ref 150–450)
RBC: 4.78 x10ˆ6/uL (ref 4.00–5.10)
RDW: 14.7 % (ref 11.0–16.0)
WBC: 17.5 x10ˆ3/uL — ABNORMAL HIGH (ref 4.0–11.0)

## 2014-11-02 LAB — BASIC METABOLIC PANEL
ANION GAP: 10 mmol/L (ref 3–11)
BUN/CREA RATIO: 19 (ref 6–22)
BUN: 13 mg/dL (ref 6–20)
CALCIUM: 9.6 mg/dL (ref 8.6–10.3)
CHLORIDE: 93 mmol/L — ABNORMAL LOW (ref 101–111)
CHLORIDE: 93 mmol/L — ABNORMAL LOW (ref 101–111)
CO2 TOTAL: 32 mmol/L (ref 22–32)
CREATININE: 0.67 mg/dL (ref 0.44–1.00)
ESTIMATED GFR: >60 mL/min/1.73mˆ2 (ref >60)
GLUCOSE: 381 mg/dL — ABNORMAL HIGH (ref 70–110)
POTASSIUM: 4.1 mmol/L (ref 3.4–5.1)
SODIUM: 135 mmol/L — ABNORMAL LOW (ref 136–145)

## 2014-11-02 LAB — POC FINGERSTICK GLUCOSE - BMC/JMC (RESULTS): GLUCOSE, POC: 345 mg/dL — ABNORMAL HIGH (ref 60–100)

## 2014-11-02 MED ORDER — CEFDINIR 300 MG CAPSULE
300.00 mg | ORAL_CAPSULE | Freq: Two times a day (BID) | ORAL | Status: DC
Start: 2014-11-02 — End: 2015-11-11

## 2014-11-02 MED ORDER — AZITHROMYCIN 250 MG TABLET
500.00 mg | ORAL_TABLET | ORAL | Status: DC
Start: 2014-11-02 — End: 2014-11-02

## 2014-11-02 MED ORDER — AZITHROMYCIN 500 MG TABLET
500.0000 mg | ORAL_TABLET | Freq: Every day | ORAL | Status: DC
Start: 2014-11-02 — End: 2015-11-11

## 2014-11-02 MED ORDER — SULFAMETHOXAZOLE 800 MG-TRIMETHOPRIM 160 MG TABLET
1.00 | ORAL_TABLET | Freq: Two times a day (BID) | ORAL | Status: DC
Start: 2014-11-02 — End: 2014-11-02

## 2014-11-02 NOTE — Nurses Notes (Signed)
Patient is discharged per MD order. AVS, home medication and follow-up appointments have been discussed and I have answered all questions. Pt will be called with a f/u appointment as her PCP office is not open today. VSS at this time. IV cath removed with tip in tact and tele box has been returned. Also reviewed with patient any signs or symptoms that will require urgent medical attention. Patient's transportation is here to take her home and she will be taken down via w/c with all her belongings.

## 2014-11-02 NOTE — Discharge Summary (Signed)
[facility]    DISCHARGE SUMMARY      PATIENT NAME:  [name], [name]  MRN:  [id number]  DOB:  [birth year]    ADMISSION DATE:  10/30/2014  DISCHARGE DATE:  11/02/2014    ATTENDING PHYSICIAN: Margarette Asal, MD  PRIMARY CARE PHYSICIAN: Emelda Brothers, MD     ADMISSION DIAGNOSIS: CAP (community acquired pneumonia)  Chief Complaint   Patient presents with    Pneumonia       DISCHARGE DIAGNOSIS:   Hospital Problems) (* Primary Problem)    Diagnosis Date Noted    *CAP (community acquired pneumonia) 10/30/2014    Morbid obesity 10/31/2014      Resolved Hospital Problems    Diagnosis Date Noted Date Resolved   No resolved problems to display.     Active Non-Hospital Problems    Diagnosis Date Noted    Tobacco abuse 10/11/2012    Chronic respiratory failure with hypoxia (HCC) 10/11/2012    Dyspnea 10/08/2012    COPD exacerbation ([place]) 10/08/2012    Obesity (BMI 35.0-39.9 without comorbidity) 10/08/2012      DISCHARGE MEDICATIONS:     Current Discharge Medication List      START taking these medications.       Details    Azithromycin 500 mg Tablet   Commonly known as:  ZITHROMAX TRI-PAK    500 mg, Oral, DAILY   Qty:  5 Tab   Refills:  0       cefdinir 300 mg Capsule   Commonly known as:  OMNICEF    300 mg, Oral, 2 TIMES DAILY   Qty:  10 Cap   Refills:  0         CONTINUE these medications - NO CHANGES were made during your visit.       Details    acetylcysteine 200 mg/mL (20 %) Solution   Commonly known as:  MUCOMYST    5 mL, Nebulization, 3 TIMES DAILY   Qty:  100 mL   Refills:  0       albuterol 2.5 mg/0.5 mL Solution for Nebulization   Commonly known as:  PROVENTIL    2.5 mg, Nebulization, 4 TIMES DAILY   Qty:  50 Each   Refills:  0       atorvastatin 20 mg Tablet   Commonly known as:  LIPITOR    20 mg, Oral, EVERY EVENING   Qty:  30 Tab   Refills:  0       COMBIVENT RESPIMAT 20-100 mcg/actuation Aerosol   Generic drug:  ipratropium-albuterol    1 Puff, Inhalation, 4 TIMES DAILY   Refills:  0        fluticasone-salmeterol 500-50 mcg/dose Disk with Device oral diskus inhaler   Commonly known as:  ADVAIR    1 INHALATION, Inhalation, 2 TIMES DAILY   Qty:  1 Inhaler   Refills:  0       JANUVIA 100 mg Tablet   Generic drug:  sitaGLIPtin    100 mg, Oral, DAILY   Refills:  0       omeprazole 20 mg Capsule, Delayed Release(E.C.)   Commonly known as:  PRILOSEC    20 mg, Oral, DAILY   Qty:  30 Cap   Refills:  0       tiotropium bromide 18 mcg Capsule, w/Inhalation Device   Commonly known as:  SPIRIVA WITH HANDIHALER    18 mcg, Inhalation, DAILY   Qty:  30 Cap   Refills:  0       XOPENEX 0.31 mg/3 mL Solution for Nebulization   Generic drug:  levalbuterol HCl    0.31 mg, Nebulization, EVERY 4 HOURS PRN   Refills:  0           DISCHARGE INSTRUCTIONS:     DISCHARGE INSTRUCTION - DIET   Diet: DIABETIC DIET      DISCHARGE INSTRUCTION - ACTIVITY   Activity: GRADUALLY INCREASE ACTIVITY AS TOLERATED      ASPIRIN NOT ORDERED AT THIS TIME             Follow-up Information     Follow up with Janus, Anderson [place], MD In 1 week.    Specialty:  EXTERNAL    Contact information:    [address]  [address]  [place] [address]  [phone]          REASON FOR HOSPITALIZATION AND HOSPITAL COURSE:  This is a 59 y.o., female admitted for PNA. Better. Wants to go home     SIGNIFICANT PHYSICAL FINDINGS:   BP 151/76 mmHg   Pulse 94   Temp(Src) 36.5 C (97.7 F)   Resp 20   Ht 1.6 m (5\' 3" )   Wt 101.969 kg (224 lb 12.8 oz)   BMI 39.83 kg/m2   SpO2 87%    General: AAOx3, Obese, No acute distress.   Eyes: Pupils equal and round, reactive to light and accomodation.   HENT:Head atraumatic and normocephalic   Neck: No JVD or thyromegaly or lymphadenopathy   Lungs: Crackles at lung bases to auscultation bilaterally.   Cardiovascular: regular rate and rhythm, S1, S2 normal, no murmur,   Abdomen: Obese, Soft, non-tender, Bowel sounds normal, No hepatosplenomegaly   Extremities: extremities normal, atraumatic, no cyanosis or edema   Skin: Skin  warm and dry   Neurologic: Grossly normal   Lymphatics: No lymphadenopathy   Psychiatric: Normal affect and behavior.    SIGNIFICANT RADIOLOGY:   Results for orders placed or performed during the hospital encounter of 10/30/14   XR CHEST AP PORTABLE     Status: None    Narrative    Radiologist:  Theressa Millard, MD               Procedure:  XR CHEST AP PORTABLE     History:  respiratory difficulty     Comparison radiograph: none    The heart and vessels are unremarkable.  The lungs are hyperinflated but   clear.  The costophrenic angles are sharp.  No abnormalities are seen on   limited evaluation of the bones.     Impression:  No acute cardiopulmonary disease. Emphysematous lungs.       COURSE IN HOSPITAL:       Acute on Chronic Respiratory Failure with Hypoxia: Clinically resolved. Off oxygen. Doing well.       CAP?: RLL. No CXR evidence of this. Off Oxygen. Empiric Omnicef and zithromax for 5 days more.       COPD with Acute Exacerbation: Clinically better. Continue on IV Solumedrol, Duonebs with PRN albuterol and continue home inhalers.       Severe Sepsis: Resolved.       Type II DM with Hyperglycemia: will hold Januvia and place on basal insulin protocol. Uncontrolled.       Tobacco Use Disorder: counseled on cessation. Declined nicoderm.       HLD: continue Lipitor.       GERD: continue Prilosec.       Obesity: Body  mass index is 39.71 kg/(m^2). Encourage weight loss.       Disposition: Home       Spent 33 minutes on the DC of this pt.          DOES PATIENT HAVE ADVANCED DIRECTIVES:  No, Information Offered and Given    ADVANCED CARE PLANNING - Not applicable for this patient    CONDITION ON DISCHARGE: Alert, Oriented and VS Stable    DISCHARGE DISPOSITION:  Home discharge     Copies sent to Care Team       Relationship Specialty Notifications Start End    Emelda Brothers, MD PCP - General EXTERNAL  10/08/12     Phone: [phone] Fax: [phone]         [address] [place]  [address]

## 2014-11-02 NOTE — Pharmacy (Signed)
[name] to PO Changes: Azithromycin   [name],[name], 59 y.o. female  Date of Birth:  1955-11-17  Date of Admission:  10/30/2014  MRN# [id number]    Per the [facility] IV to PO Policy, the following changes have been made:    Original Order: Azithromycin 500 mg , IV, Every 24 hours    New Order: Azithromycin 500 mg , Oral, Daily    Please contact pharmacy with any questions.    Bonney Aid, PHARMD   11/02/2014, 09:05

## 2014-11-04 LAB — ADULT ROUTINE BLOOD CULTURE, SET OF 2 BOTTLES (BACTERIA AND YEAST): BLOOD CULTURE, ROUTINE: NO GROWTH

## 2015-10-28 ENCOUNTER — Ambulatory Visit (INDEPENDENT_AMBULATORY_CARE_PROVIDER_SITE_OTHER): Payer: BC Managed Care – PPO | Admitting: Family Medicine

## 2015-11-11 ENCOUNTER — Inpatient Hospital Stay (HOSPITAL_BASED_OUTPATIENT_CLINIC_OR_DEPARTMENT_OTHER): Payer: BC Managed Care – PPO

## 2015-11-11 ENCOUNTER — Inpatient Hospital Stay (HOSPITAL_BASED_OUTPATIENT_CLINIC_OR_DEPARTMENT_OTHER)
Admission: EM | Admit: 2015-11-11 | Discharge: 2015-11-14 | DRG: 190 | Disposition: A | Discharge status: Home / Self Care | Payer: BC Managed Care – PPO | Attending: Internal Medicine | Admitting: Internal Medicine

## 2015-11-11 ENCOUNTER — Encounter (HOSPITAL_BASED_OUTPATIENT_CLINIC_OR_DEPARTMENT_OTHER): Payer: Self-pay

## 2015-11-11 ENCOUNTER — Emergency Department (HOSPITAL_BASED_OUTPATIENT_CLINIC_OR_DEPARTMENT_OTHER): Payer: BC Managed Care – PPO

## 2015-11-11 DIAGNOSIS — F17210 Nicotine dependence, cigarettes, uncomplicated: Secondary | ICD-10-CM | POA: Diagnosis present

## 2015-11-11 DIAGNOSIS — E669 Obesity, unspecified: Secondary | ICD-10-CM | POA: Diagnosis present

## 2015-11-11 DIAGNOSIS — R6510 Systemic inflammatory response syndrome (SIRS) of non-infectious origin without acute organ dysfunction: Secondary | ICD-10-CM

## 2015-11-11 DIAGNOSIS — G4733 Obstructive sleep apnea (adult) (pediatric): Secondary | ICD-10-CM

## 2015-11-11 DIAGNOSIS — I10 Essential (primary) hypertension: Secondary | ICD-10-CM | POA: Diagnosis present

## 2015-11-11 DIAGNOSIS — Z5320 Procedure and treatment not carried out because of patient's decision for unspecified reasons: Secondary | ICD-10-CM | POA: Diagnosis present

## 2015-11-11 DIAGNOSIS — Z6834 Body mass index (BMI) 34.0-34.9, adult: Secondary | ICD-10-CM

## 2015-11-11 DIAGNOSIS — J441 Chronic obstructive pulmonary disease with (acute) exacerbation: Secondary | ICD-10-CM

## 2015-11-11 DIAGNOSIS — Z720 Tobacco use: Secondary | ICD-10-CM

## 2015-11-11 DIAGNOSIS — R000 Tachycardia, unspecified: Secondary | ICD-10-CM

## 2015-11-11 DIAGNOSIS — E785 Hyperlipidemia, unspecified: Secondary | ICD-10-CM | POA: Diagnosis present

## 2015-11-11 DIAGNOSIS — R9431 Abnormal electrocardiogram [ECG] [EKG]: Secondary | ICD-10-CM

## 2015-11-11 DIAGNOSIS — Z7951 Long term (current) use of inhaled steroids: Secondary | ICD-10-CM

## 2015-11-11 DIAGNOSIS — J9621 Acute and chronic respiratory failure with hypoxia: Secondary | ICD-10-CM | POA: Diagnosis present

## 2015-11-11 DIAGNOSIS — E1165 Type 2 diabetes mellitus with hyperglycemia: Secondary | ICD-10-CM | POA: Diagnosis present

## 2015-11-11 DIAGNOSIS — E119 Type 2 diabetes mellitus without complications: Secondary | ICD-10-CM

## 2015-11-11 DIAGNOSIS — J9601 Acute respiratory failure with hypoxia: Secondary | ICD-10-CM

## 2015-11-11 DIAGNOSIS — R0600 Dyspnea, unspecified: Secondary | ICD-10-CM

## 2015-11-11 DIAGNOSIS — Z7984 Long term (current) use of oral hypoglycemic drugs: Secondary | ICD-10-CM

## 2015-11-11 DIAGNOSIS — Z23 Encounter for immunization: Secondary | ICD-10-CM

## 2015-11-11 DIAGNOSIS — J9622 Acute and chronic respiratory failure with hypercapnia: Secondary | ICD-10-CM | POA: Diagnosis present

## 2015-11-11 DIAGNOSIS — J449 Chronic obstructive pulmonary disease, unspecified: Secondary | ICD-10-CM

## 2015-11-11 DIAGNOSIS — J9602 Acute respiratory failure with hypercapnia: Secondary | ICD-10-CM

## 2015-11-11 DIAGNOSIS — Z79899 Other long term (current) drug therapy: Secondary | ICD-10-CM

## 2015-11-11 DIAGNOSIS — J9611 Chronic respiratory failure with hypoxia: Secondary | ICD-10-CM

## 2015-11-11 DIAGNOSIS — R739 Hyperglycemia, unspecified: Secondary | ICD-10-CM

## 2015-11-11 DIAGNOSIS — R079 Chest pain, unspecified: Secondary | ICD-10-CM

## 2015-11-11 DIAGNOSIS — Z9981 Dependence on supplemental oxygen: Secondary | ICD-10-CM

## 2015-11-11 LAB — COMPREHENSIVE METABOLIC PROFILE - BMC/JMC ONLY
ALBUMIN/GLOBULIN RATIO: 1.1 (ref 0.8–2.0)
ALT (SGPT): 15 U/L (ref 14–54)
ANION GAP: 11 mmol/L (ref 3–11)
AST (SGOT): 16 U/L (ref 15–41)
BILIRUBIN TOTAL: 0.5 mg/dL (ref 0.3–1.2)
BUN: 4 mg/dL — ABNORMAL LOW (ref 6–20)
CALCIUM: 9.4 mg/dL (ref 8.6–10.3)
CHLORIDE: 96 mmol/L — ABNORMAL LOW (ref 101–111)
CO2 TOTAL: 28 mmol/L (ref 22–32)
CREATININE: 0.56 mg/dL (ref 0.44–1.00)
CREATININE: 0.56 mg/dL (ref 0.44–1.00)
ESTIMATED GFR: >60 mL/min/1.73mˆ2 (ref >60)
GLUCOSE: 320 mg/dL — ABNORMAL HIGH (ref 70–110)
POTASSIUM: 4 mmol/L (ref 3.4–5.1)
PROTEIN TOTAL: 7.4 g/dL (ref 6.4–8.3)

## 2015-11-11 LAB — CBC WITH DIFF
BASOPHIL #: 0.1 x10ˆ3/uL (ref 0.00–0.10)
BASOPHIL %: 1 % (ref 0–3)
EOSINOPHIL #: 0.1 x10ˆ3/uL (ref 0.00–0.50)
EOSINOPHIL %: 1 % (ref 0–5)
HCT: 49.3 % — ABNORMAL HIGH (ref 36.0–45.0)
HGB: 16 g/dL — ABNORMAL HIGH (ref 12.0–15.5)
LYMPHOCYTE #: 2.3 x10ˆ3/uL (ref 1.00–4.80)
LYMPHOCYTE %: 16 % (ref 15–43)
MCH: 30.1 pg (ref 27.5–33.2)
MCHC: 32.5 g/dL (ref 32.0–36.0)
MCV: 92.6 fL (ref 82.0–97.0)
MONOCYTE #: 1.5 x10ˆ3/uL — ABNORMAL HIGH (ref 0.20–0.90)
MONOCYTE %: 11 % (ref 5–12)
MONOCYTE %: 11 % (ref 5–12)
MPV: 8.4 fL (ref 7.4–10.5)
NEUTROPHIL #: 10.5 x10ˆ3/uL — ABNORMAL HIGH (ref 1.50–6.50)
NEUTROPHIL %: 72 % (ref 43–76)
PLATELETS: 267 x10ˆ3/uL (ref 150–450)
RBC: 5.32 10*6/uL — ABNORMAL HIGH (ref 4.00–5.10)
RBC: 5.32 x10ˆ6/uL — ABNORMAL HIGH (ref 4.00–5.10)
RDW: 14.3 % (ref 11.0–16.0)
WBC: 14.5 x10ˆ3/uL — ABNORMAL HIGH (ref 4.0–11.0)

## 2015-11-11 LAB — RESPIRATORY VIRUS PANEL
ADENOVIRUS ARRAY: NOT DETECTED
BORDETELLA PERTUSSIS ARRAY: NOT DETECTED
CHLAMYDOPHILA PNEUMONIAE ARRAY: NOT DETECTED
CORONAVIRUS 229E: NOT DETECTED
CORONAVIRUS HKU1: NOT DETECTED
CORONAVIRUS NL63: NOT DETECTED
CORONAVIRUS OC43: NOT DETECTED
INFLUENZA A: NOT DETECTED
INFLUENZA B ARRAY: NOT DETECTED
METAPNEUMOVIRUS ARRAY: NOT DETECTED
MYCOPLASMA PNEUMONIAE ARRAY: NOT DETECTED
PARAINFLUENZA 1 ARRAY: NOT DETECTED
PARAINFLUENZA 2 ARRAY: NOT DETECTED
PARAINFLUENZA 3 ARRAY: NOT DETECTED
PARAINFLUENZA 4 ARRAY: NOT DETECTED
RHINOVIRUS/ENTEROVIRUS ARRAY: DETECTED — AB
RSV ARRAY: NOT DETECTED

## 2015-11-11 LAB — POC FINGERSTICK GLUCOSE - BMC/JMC (RESULTS)
GLUCOSE, POC: 239 mg/dL — ABNORMAL HIGH (ref 60–100)
GLUCOSE, POC: 312 mg/dL — ABNORMAL HIGH (ref 60–100)
GLUCOSE, POC: 350 mg/dL — ABNORMAL HIGH (ref 60–100)

## 2015-11-11 LAB — B-TYPE NATRIURETIC PEPTIDE: BNP: 103 pg/mL — ABNORMAL HIGH (ref 0–100)

## 2015-11-11 LAB — IRON
IRON: 37 ug/dL (ref 28–170)
IRON: 37 ug/dL (ref 28–170)

## 2015-11-11 LAB — PT/INR
INR: 1.13
PROTHROMBIN TIME: 12.4 s (ref 9.4–12.5)

## 2015-11-11 LAB — ARTERIAL BLOOD GAS (RESULTS)- BMC/JMC
BASE EXCESS: 4.7 mmol/L — ABNORMAL HIGH (ref ?–2.0)
BICARBONATE: 31.8 mmol/L — ABNORMAL HIGH (ref 21.0–28.0)
O2 SATURATION: 80.7 % — ABNORMAL LOW (ref 93.0–98.0)
PCO2: 54.5 mmHg — ABNORMAL HIGH (ref 35.00–45.00)
PH: 7.37 (ref 7.35–7.45)
PH: 7.37 (ref 7.35–7.45)

## 2015-11-11 LAB — IRON TRANSFERRIN AND TIBC
IRON (TRANSFERRIN) SATURATION: 10 % — ABNORMAL LOW (ref 15–50)
TOTAL IRON BINDING CAPACITY: 369 ug/dL
TRANSFERRIN: 258 mg/dL (ref 192–282)

## 2015-11-11 LAB — TROPONIN-I
TROPONIN I: <0.03 ng/mL (ref <=0.06)
TROPONIN I: <0.03 ng/mL (ref <=0.06)

## 2015-11-11 LAB — FERRITIN: FERRITIN: 157 ng/mL (ref 11–307)

## 2015-11-11 LAB — RED TOP TUBE

## 2015-11-11 LAB — RESPIRATORY VIRUS PANEL BY BIOFIRE FILM ARRAY: PARAINFLUENZA 3 ARRAY: NOT DETECTED

## 2015-11-11 MED ORDER — DEXTROSE 50 % IN WATER (D50W) INTRAVENOUS SYRINGE
12.50 g | INJECTION | INTRAVENOUS | Status: DC | PRN
Start: 2015-11-11 — End: 2015-11-11

## 2015-11-11 MED ORDER — IPRATROPIUM BROMIDE 0.02 % SOLUTION FOR INHALATION
0.50 mg | RESPIRATORY_TRACT | Status: DC
Start: 2015-11-11 — End: 2015-11-14
  Administered 2015-11-11 – 2015-11-14 (×16): 0.5 mg via RESPIRATORY_TRACT
  Filled 2015-11-11 (×15): qty 1

## 2015-11-11 MED ORDER — SODIUM CHLORIDE 0.9 % (FLUSH) INJECTION SYRINGE
10.0000 mL | INJECTION | Freq: Three times a day (TID) | INTRAMUSCULAR | Status: DC
Start: 2015-11-11 — End: 2015-11-14
  Administered 2015-11-11 (×2): 10 mL via INTRAVENOUS
  Administered 2015-11-12: 0 mL via INTRAVENOUS
  Administered 2015-11-12: 10 mL via INTRAVENOUS
  Administered 2015-11-12 – 2015-11-13 (×3): 0 mL via INTRAVENOUS
  Administered 2015-11-13: 10 mL via INTRAVENOUS
  Administered 2015-11-14: 0 mL via INTRAVENOUS

## 2015-11-11 MED ORDER — LEVALBUTEROL 1.25 MG/3ML NEB SOLUTION - EAST
1.25 mg | INHALATION_SOLUTION | RESPIRATORY_TRACT | Status: DC
Start: 2015-11-11 — End: 2015-11-14
  Administered 2015-11-11 – 2015-11-14 (×16): 1.25 mg via RESPIRATORY_TRACT
  Filled 2015-11-11 (×16): qty 1

## 2015-11-11 MED ORDER — ALBUTEROL SULFATE CONCENTRATE 2.5 MG/0.5 ML SOLUTION FOR NEBULIZATION
2.50 mg | INHALATION_SOLUTION | Freq: Four times a day (QID) | RESPIRATORY_TRACT | Status: DC
Start: 2015-11-11 — End: 2015-11-11

## 2015-11-11 MED ORDER — ACETAMINOPHEN 325 MG TABLET
650.0000 mg | ORAL_TABLET | Freq: Four times a day (QID) | ORAL | Status: DC | PRN
Start: 2015-11-11 — End: 2015-11-14
  Administered 2015-11-12 – 2015-11-14 (×2): 650 mg via ORAL
  Filled 2015-11-11 (×2): qty 2

## 2015-11-11 MED ORDER — SODIUM CHLORIDE 0.9 % (FLUSH) INJECTION SYRINGE
10.0000 mL | INJECTION | INTRAMUSCULAR | Status: DC | PRN
Start: 2015-11-11 — End: 2015-11-14
  Administered 2015-11-11 – 2015-11-12 (×3): 10 mL via INTRAVENOUS

## 2015-11-11 MED ORDER — DEXTROSE 50 % IN WATER (D50W) INTRAVENOUS SYRINGE
12.50 g | INJECTION | INTRAVENOUS | Status: DC | PRN
Start: 2015-11-11 — End: 2015-11-12

## 2015-11-11 MED ORDER — ALBUTEROL SULFATE CONCENTRATE 2.5 MG/0.5 ML SOLUTION FOR NEBULIZATION
INHALATION_SOLUTION | RESPIRATORY_TRACT | Status: AC
Start: 2015-11-11 — End: 2015-11-11
  Administered 2015-11-11: 2.5 mg via RESPIRATORY_TRACT
  Filled 2015-11-11: qty 1

## 2015-11-11 MED ORDER — FORMOTEROL FUMARATE 20 MCG/2 ML SOLUTION FOR NEBULIZATION
20.00 ug | INHALATION_SOLUTION | Freq: Two times a day (BID) | RESPIRATORY_TRACT | Status: DC
Start: 2015-11-11 — End: 2015-11-14
  Administered 2015-11-11 – 2015-11-14 (×6): 20 ug via RESPIRATORY_TRACT
  Filled 2015-11-11 (×10): qty 1

## 2015-11-11 MED ORDER — NITROGLYCERIN 0.4 MG SUBLINGUAL TABLET
0.4000 mg | SUBLINGUAL_TABLET | SUBLINGUAL | Status: DC | PRN
Start: 2015-11-11 — End: 2015-11-14

## 2015-11-11 MED ORDER — LINAGLIPTIN 5 MG TABLET
5.0000 mg | ORAL_TABLET | Freq: Every day | ORAL | Status: DC
Start: 2015-11-11 — End: 2015-11-14
  Administered 2015-11-11 – 2015-11-14 (×4): 5 mg via ORAL
  Filled 2015-11-11 (×6): qty 1

## 2015-11-11 MED ORDER — SODIUM CHLORIDE 0.9 % (FLUSH) INJECTION SYRINGE
10.0000 mL | INJECTION | Freq: Three times a day (TID) | INTRAMUSCULAR | Status: DC
Start: 2015-11-11 — End: 2015-11-13
  Administered 2015-11-11: 10 mL via INTRAVENOUS
  Administered 2015-11-11: 0 mL via INTRAVENOUS
  Administered 2015-11-12 (×4): 10 mL via INTRAVENOUS

## 2015-11-11 MED ORDER — BUDESONIDE-FORMOTEROL HFA 80 MCG-4.5 MCG/ACTUATION AEROSOL INHALER
1.00 | INHALATION_SPRAY | Freq: Two times a day (BID) | RESPIRATORY_TRACT | Status: DC
Start: 2015-11-11 — End: 2015-11-11
  Administered 2015-11-11: 0 via RESPIRATORY_TRACT
  Filled 2015-11-11: qty 7

## 2015-11-11 MED ORDER — DOXYCYCLINE HYCLATE 100 MG TABLET
100.00 mg | ORAL_TABLET | Freq: Two times a day (BID) | ORAL | Status: DC
Start: 2015-11-11 — End: 2015-11-11

## 2015-11-11 MED ORDER — SODIUM CHLORIDE 0.9 % INTRAVENOUS SOLUTION
500.00 mg | INTRAVENOUS | Status: AC
Start: 2015-11-11 — End: 2015-11-11
  Administered 2015-11-11: 500 mg via INTRAVENOUS
  Administered 2015-11-11: 0 mg via INTRAVENOUS
  Filled 2015-11-11: qty 5

## 2015-11-11 MED ORDER — IPRATROPIUM BROMIDE 0.02 % SOLUTION FOR INHALATION
RESPIRATORY_TRACT | Status: AC
Start: 2015-11-11 — End: 2015-11-11
  Administered 2015-11-11: 0.5 mg via RESPIRATORY_TRACT
  Filled 2015-11-11: qty 1

## 2015-11-11 MED ORDER — INSULIN REGULAR HUMAN 100 UNIT/ML INJECTION SSIP - CITY
1.00 [IU] | Freq: Four times a day (QID) | INTRAMUSCULAR | Status: DC
Start: 2015-11-11 — End: 2015-11-11
  Filled 2015-11-11: qty 3

## 2015-11-11 MED ORDER — PANTOPRAZOLE 40 MG TABLET,DELAYED RELEASE
40.00 mg | DELAYED_RELEASE_TABLET | Freq: Every day | ORAL | Status: DC
Start: 2015-11-11 — End: 2015-11-14
  Administered 2015-11-11 – 2015-11-14 (×4): 40 mg via ORAL
  Filled 2015-11-11 (×4): qty 1

## 2015-11-11 MED ORDER — METHYLPREDNISOLONE SOD SUCC 125 MG SOLUTION FOR INJECTION WRAPPER
62.50 mg | Freq: Four times a day (QID) | INTRAVENOUS | Status: DC
Start: 2015-11-11 — End: 2015-11-12
  Administered 2015-11-11 – 2015-11-12 (×3): 62.5 mg via INTRAVENOUS
  Filled 2015-11-11 (×3): qty 2

## 2015-11-11 MED ORDER — TIOTROPIUM BROMIDE 18 MCG CAPSULE WITH INHALATION DEVICE
18.0000 ug | ORAL_CAPSULE | Freq: Every day | RESPIRATORY_TRACT | Status: DC
Start: 2015-11-11 — End: 2015-11-11
  Administered 2015-11-11: 0 ug via RESPIRATORY_TRACT
  Filled 2015-11-11: qty 5

## 2015-11-11 MED ORDER — SITAGLIPTIN PHOSPHATE 100 MG TABLET
100.0000 mg | ORAL_TABLET | Freq: Every day | ORAL | Status: DC
Start: 2015-11-11 — End: 2015-11-11

## 2015-11-11 MED ORDER — SODIUM CHLORIDE 0.9 % INTRAVENOUS SOLUTION
2.0000 g | INTRAVENOUS | Status: DC
Start: 2015-11-11 — End: 2015-11-11

## 2015-11-11 MED ORDER — NICOTINE 21 MG/24 HR DAILY TRANSDERMAL PATCH
21.0000 mg | MEDICATED_PATCH | Freq: Every day | TRANSDERMAL | Status: DC
Start: 2015-11-11 — End: 2015-11-14
  Administered 2015-11-11 – 2015-11-12 (×2): 0 mg via TRANSDERMAL
  Administered 2015-11-13 – 2015-11-14 (×3): 21 mg via TRANSDERMAL
  Filled 2015-11-11 (×3): qty 1

## 2015-11-11 MED ORDER — LISINOPRIL 20 MG TABLET
20.0000 mg | ORAL_TABLET | Freq: Every day | ORAL | Status: DC
Start: 2015-11-11 — End: 2015-11-12
  Administered 2015-11-11 – 2015-11-12 (×2): 20 mg via ORAL
  Filled 2015-11-11 (×2): qty 1

## 2015-11-11 MED ORDER — INSULIN REGULAR HUMAN 100 UNIT/ML INJECTION SSIP - CITY
1.00 [IU] | Freq: Four times a day (QID) | INTRAMUSCULAR | Status: DC
Start: 2015-11-11 — End: 2015-11-12
  Administered 2015-11-11: 8 [IU] via SUBCUTANEOUS
  Administered 2015-11-11: 7 [IU] via SUBCUTANEOUS
  Administered 2015-11-12: 8 [IU] via SUBCUTANEOUS
  Administered 2015-11-12: 5 [IU] via SUBCUTANEOUS

## 2015-11-11 MED ORDER — IPRATROPIUM BROMIDE 0.02 % SOLUTION FOR INHALATION
0.50 mg | RESPIRATORY_TRACT | Status: AC
Start: 2015-11-11 — End: 2015-11-11

## 2015-11-11 MED ORDER — IPRATROPIUM BROMIDE 0.02 % SOLUTION FOR INHALATION
0.50 mg | Freq: Four times a day (QID) | RESPIRATORY_TRACT | Status: DC
Start: 2015-11-11 — End: 2015-11-11

## 2015-11-11 MED ORDER — BUDESONIDE 0.5 MG/2 ML SUSPENSION FOR NEBULIZATION
0.50 mg | INHALATION_SUSPENSION | Freq: Two times a day (BID) | RESPIRATORY_TRACT | Status: DC
Start: 2015-11-11 — End: 2015-11-14
  Administered 2015-11-11 – 2015-11-14 (×6): 0.5 mg via RESPIRATORY_TRACT
  Filled 2015-11-11 (×7): qty 2

## 2015-11-11 MED ORDER — HYDRALAZINE 20 MG/ML INJECTION SOLUTION
10.00 mg | Freq: Four times a day (QID) | INTRAMUSCULAR | Status: DC | PRN
Start: 2015-11-11 — End: 2015-11-14
  Administered 2015-11-11: 10 mg via INTRAVENOUS
  Filled 2015-11-11: qty 1

## 2015-11-11 MED ORDER — ENOXAPARIN 40 MG/0.4 ML SUB-Q SYRINGE - EAST
40.0000 mg | INJECTION | Freq: Every day | SUBCUTANEOUS | Status: DC
Start: 2015-11-11 — End: 2015-11-14
  Administered 2015-11-11 – 2015-11-14 (×4): 40 mg via SUBCUTANEOUS
  Filled 2015-11-11 (×4): qty 0

## 2015-11-11 MED ORDER — FLU VACCINE QS2017-18(36 MOS UP)(PF)60 MCG(15 MCGX4)/0.5 ML IM SYRINGE
0.5000 mL | INJECTION | Freq: Once | INTRAMUSCULAR | Status: AC
Start: 2015-11-11 — End: 2015-11-11
  Administered 2015-11-11: 0.5 mL via INTRAMUSCULAR
  Filled 2015-11-11: qty 1

## 2015-11-11 MED ORDER — CEFTRIAXONE 2 GRAM SOLUTION FOR INJECTION - IV DEFAULT
2.0000 g | INTRAMUSCULAR | Status: AC
Start: 2015-11-11 — End: 2015-11-11
  Administered 2015-11-11: 2 g via INTRAVENOUS
  Filled 2015-11-11: qty 20

## 2015-11-11 MED ORDER — CEFTRIAXONE 1 GRAM/50 ML IN DEXTROSE (ISO-OSMOT) INTRAVENOUS PIGGYBACK
1.0000 g | INJECTION | INTRAVENOUS | Status: DC
Start: 2015-11-12 — End: 2015-11-12
  Filled 2015-11-11 (×2): qty 50

## 2015-11-11 MED ORDER — METHYLPREDNISOLONE SOD SUCC 125 MG SOLUTION FOR INJECTION WRAPPER
62.50 mg | Freq: Three times a day (TID) | INTRAVENOUS | Status: DC
Start: 2015-11-11 — End: 2015-11-11
  Administered 2015-11-11: 62.5 mg via INTRAVENOUS
  Filled 2015-11-11: qty 2

## 2015-11-11 MED ORDER — ALBUTEROL SULFATE CONCENTRATE 2.5 MG/0.5 ML SOLUTION FOR NEBULIZATION
2.50 mg | INHALATION_SOLUTION | RESPIRATORY_TRACT | Status: AC
Start: 2015-11-11 — End: 2015-11-11

## 2015-11-11 MED ORDER — SODIUM CHLORIDE 0.9 % INTRAVENOUS SOLUTION
500.00 mg | INTRAVENOUS | Status: DC
Start: 2015-11-12 — End: 2015-11-12
  Administered 2015-11-12: 500 mg via INTRAVENOUS
  Administered 2015-11-12: 0 mg via INTRAVENOUS
  Filled 2015-11-11 (×2): qty 5

## 2015-11-11 MED ADMIN — insulin regular human 100 unit/mL injection solution: SUBCUTANEOUS | @ 17:00:00

## 2015-11-11 MED ADMIN — SITagliptin 100 mg tablet: @ 13:00:00

## 2015-11-11 MED ADMIN — lidocaine 4 % topical patch: RESPIRATORY_TRACT | @ 19:00:00 | NDC 46581083006

## 2015-11-11 MED ADMIN — levalbuterol 1.25 mg/3 mL solution for nebulization: RESPIRATORY_TRACT | @ 16:00:00

## 2015-11-11 MED ADMIN — insulin regular human 100 unit/mL injection solution: SUBCUTANEOUS | @ 21:00:00

## 2015-11-11 MED ADMIN — sodium chloride 0.9 % (flush) injection syringe: RESPIRATORY_TRACT | @ 22:00:00

## 2015-11-11 MED ADMIN — SODIUM CHLORIDE 0.9 % W/ ADDITIVES: RESPIRATORY_TRACT | @ 22:00:00 | NDC 00338004903

## 2015-11-11 NOTE — Nurses Notes (Signed)
Admitted at 1230 to room ICU 14 accompanied by Gershon Mussel, RN and Keane Scrape, respiratory therapist.

## 2015-11-11 NOTE — H&P (Signed)
[facility]  [place], [place] [address]    General History and Physical    Mariana, [place]  Date of Admission:  11/11/2015  Date of Birth:  08-29-1955    PCP: Emelda Brothers, MD  Chief Complaint:  Shortness of breath      HPI: HEE KHA is a 60 y.o., White female who presents with shortness of breath that started on Sunday  Patient reports of shortness of breath at rest.  She is unable to do much activity as she gets out of breath  Also reports of yellow phlegm, reports of low-grade fever 100.2 that she checked yesterday at home .   Reports that she was supposed to be on oxygen with because of insurance issues she never was on oxygen at home  Denies chest pain palpitations nausea vomiting chills.  She denies headache loss of consciousness.  Denies abdominal pain constipation diarrhea bleeding or black stools.  Denies urinary burning.  Denies recent travel.  Denies any increased leg swelling.    Upon arrival to the ER patient was hypoxemic ABG was done which revealed elevated pCO2 and low PO2 this was on Ventimask.  Patient was then switched to BiPAP and she was admitted to ICU  Patient is currently off of BiPAP her, short of breath on conversing    [facility] Problems   (*Primary Problem)    Diagnosis    COPD with exacerbation ([place])       Past Medical History:   Diagnosis Date    COPD (chronic obstructive pulmonary disease) ([place])     Degenerative joint disease involving multiple joints     Diabetes mellitus ([place])     HTN (hypertension)     Smoker            Past Surgical History:   Procedure Laterality Date    HX CARPAL TUNNEL RELEASE             Medications Prior to Admission     Prescriptions    albuterol (PROVENTIL) 2.5 mg/0.5 mL Inhalation Solution for Nebulization    2.5 mg by Nebulization route Four times a day    fluticasone-salmeterol (ADVAIR) 500-50 mcg/dose Inhalation Disk with Device oral diskus inhaler    Take 1 INHALATION by inhalation Twice daily    glimepiride  (AMARYL) 2 mg Oral Tablet    Take 2 mg by mouth Every morning with breakfast    ipratropium-albuterol (COMBIVENT RESPIMAT) 20-100 mcg/actuation Inhalation Aerosol    Take 1 Puff by inhalation Four times a day    levalbuterol HCl (XOPENEX) 0.31 mg/3 mL Inhalation Solution for Nebulization    0.31 mg by Nebulization route Every 4 hours as needed    lisinopril (PRINIVIL) 20 mg Oral Tablet    Take 20 mg by mouth Once a day    sitaGLIPtin (JANUVIA) 100 mg Oral Tablet    Take 100 mg by mouth Once a day    tiotropium bromide (SPIRIVA WITH HANDIHALER) 18 mcg Inhalation Capsule, w/Inhalation Device    Take 1 Cap (18 mcg total) by inhalation Once a day    acetylcysteine (MUCOMYST) 200 mg/mL (20 %) Solution    5 mL by Nebulization route Three times a day    atorvastatin (LIPITOR) 20 mg Oral Tablet    Take 1 Tab (20 mg total) by mouth Every evening    Azithromycin (ZITHROMAX TRI-PAK) 500 mg Oral Tablet    Take 1 Tab (500 mg total) by mouth Once a day  cefdinir (OMNICEF) 300 mg Oral Capsule    Take 1 Cap (300 mg total) by mouth Twice daily    omeprazole (PRILOSEC) 20 mg Oral Capsule, Delayed Release(E.C.)    Take 1 Cap (20 mg total) by mouth Once a day          Current Facility-Administered Medications:  acetaminophen (TYLENOL) tablet 650 mg Oral Q6H PRN   [START ON 11/12/2015] azithromycin (ZITHROMAX) 500 mg in NS 250 mL IVPB 500 mg Intravenous Q24H   budesonide (PULMICORT RESPULES) 0.5 mg/2 mL nebulizer suspension 0.5 mg Nebulization 2x/day   [START ON 11/12/2015] cefTRIAXone (ROCEPHIN) 1 g in iso-osmotic 50 mL premix IVPB 1 g Intravenous Q24H   formoterol (PERFOROMIST) 20 mcg/2 mL nebulizer solution 20 mcg Nebulization Q12H   hydrALAZINE (APRESOLINE) injection 10 mg 10 mg Intravenous Q6H PRN   ipratropium (ATROVENT) 0.02% nebulizer solution 0.5 mg Nebulization Q4H WA   levalbuterol (XOPENEX) 1.25 mg/ 3 mL nebulizer solution 1.25 mg Nebulization Q4H WA   linagliptin (TRADJENTA) tablet 5 mg Oral Daily   lisinopril (PRINIVIL)  tablet 20 mg Oral Daily   methylPREDNISolone sod succ (SOLU-MEDROL) 125 mg/2 mL injection 62.5 mg Intravenous Q6H   nicotine (NICODERM CQ) transdermal patch (mg/24 hr) 21 mg Transdermal Daily   nitroGLYCERIN (NITROSTAT) sublingual tablet 0.4 mg Sublingual Q5 Min PRN   NS flush syringe 10 mL Intravenous Q8H   NS flush syringe 10 mL Intravenous Q8HRS   NS flush syringe 10 mL Intravenous Q1H PRN   pantoprazole (PROTONIX) delayed release tablet 40 mg Oral Daily       Allergies   Allergen Reactions    Codeine  Other Adverse Reaction (Add comment)     Sick to stomach    Hydrocodone Nausea/ Vomiting    Metformin Diarrhea    Oxycodone Nausea/ Vomiting       Social History   Substance Use Topics    Smoking status: Current Every Day Smoker     Packs/day: 1.00     Years: 20.00     Types: Cigarettes    Smokeless tobacco: Never Used    Alcohol use No       Family Medical History     Problem Relation (Age of Onset)    COPD Mother    Coronary Artery Disease Father    Diabetes Mother, Sister, Brother, Paternal Grandmother              ROS: Other than ROS in the HPI, all other systems were negative.    DNR Status:  Prior    EXAM:  Temperature: 37.2 C (98.9 F)  Heart Rate: (!) 118  BP (Non-Invasive): (!) 161/82  Respiratory Rate: (!) 30  SpO2-1: 97 %  Pain Score (Numeric, Faces): 0  General:  Appears chronically ill, older than stated age, acutely ill, short of breath on conversing  Eyes: Pupils equal and round, reactive to light and accomodation.   HEENT: Head atraumatic and normocephalic   Neck: No JVD or thyromegaly or lymphadenopathy   Lungs:  Decreased air entry, wheezing, no Crepts   Cardiovascular: regular rate and rhythm tachycardia, S1, S2 normal, no murmur  Abdomen: Soft, non-tender, Bowel sounds normal, No hepatosplenomegaly   Extremities: extremities normal, atraumatic, no cyanosis or edema   Skin: Skin warm and dry   Neurologic: Grossly normal , nonfocal  Lymphatics: No lymphadenopathy   Psychiatric: Normal  affect, behavior,         Labs:    I have reviewed all lab results.  Imaging Studies:  CXR:  IMPRESSION:  IMPRESSION:  No radiographic evidence for acute cardiopulmonary disease.  COPD      EKG reviewed:  Sinus tach, T-wave inversions in V6, ST depression lead 2  DVT RISK FACTORS HAVE BEN ASSESSED AND PROPHYLAXIS ORDERED (SEE RUBYONLINE - REFERENCE TOOLS - MD, DVT PROPHY OR POCKET CARD)    Assessment/Plan:     1 acute exacerbation of COPD:     Patient is currently requiring BiPAP     Start on IV Solu-Medrol Duo nebs incentive spirometry     Start on Rocephin and doxycycline     Chest x-ray noted     Pulmonary consultation appreciated    2.  Acute on chronic hypercapnic and hypoxemic respiratory failure:       Secondary to acute exacerbation of COPD:       Treat as #1       Likely patient will need O2 on discharge    3. Hypertension:      Uncontrolled,      Continue with lisinopril,       P.r.n. Hydralazine    4. History of diabetes mellitus type 2:      Blood sugars running high      Continue linagliptin, add medium dose sliding scale insulin      5. History of hyperlipidemia:       Continue with atorvastatin       Check troponin, so far negative      DVT and GI prophylaxis. Lovenox and protonix.  Code status.  Discussed with the patient Full code.    Plan of care discussed with the patient and the patient's nurse at the bedside    Portions of this note may be dictated using voice recognition software or a dictation service. Variances in spelling and vocabulary are possible and unintentional. Not all errors are caught/corrected. Please notify the [facility] if any discrepancies are noted or if the meaning of any statement is not clear.

## 2015-11-11 NOTE — Respiratory Therapy (Signed)
Patient decreased from 50% venti mask to 35% venti mask, O2 sat 95%, will continue to monitor.

## 2015-11-11 NOTE — Consults (Signed)
[facility]  [place], [place] [address]      Critical Care  INITIAL CONSULT NOTE    [name], [name], 60 y.o. female  Date of Admission:  11/11/2015  Date of service: 11/11/2015  Date of Birth:  1955/05/21  Hospital Day:  LOS: 0 days     Service: PCCM  Requesting MD: Dr.   Jinny Blossom     Information Obtained from: patient and history reviewed via medical record  Chief Complaint:  SOB      HPI/Discussion:  [name] is a 60 y.o., White female with history of at least 60 pack year smoking, COPD on home oxygen, and obesity admitted for worsening SOB and chest congestion. She also states that she had a fever but no chill, chest pain, or sweats. She has some wheezing. She still smokes. She denies any sick contact or recent travel. Found to be in distress and hypoxic. patient started on BiPAP and admitted to ICU    [id number]: positive for fevers and fatigue  Respiratory: positive for cough, wheezing or dyspnea on exertion  Cardiovascular: positive for dyspnea  Gastrointestinal: negative  Hematologic/lymphatic: negative  Musculoskeletal:negative  Neurological: negative  Behavioral/Psych: positive for tobacco use  All other ROS Negative      Past Medical History  No current outpatient prescriptions on file.     Allergies   Allergen Reactions    Codeine  Other Adverse Reaction (Add comment)     Sick to stomach    Hydrocodone Nausea/ Vomiting    Metformin Diarrhea    Oxycodone Nausea/ Vomiting     Past Medical History:   Diagnosis Date    COPD (chronic obstructive pulmonary disease) (H[facility])     Degenerative joint disease involving multiple joints     Diabetes mellitus ([facility])     HTN (hypertension)     Smoker          Past Surgical History:   Procedure Laterality Date    HX CARPAL TUNNEL RELEASE           Family Medical History     Problem Relation (Age of Onset)    COPD Mother    Coronary Artery Disease Father    Diabetes Mother, Sister, Brother, Paternal Grandmother            Social  History     Social History    Marital status: Married     Spouse name: N/A    Number of children: N/A    Years of education: N/A     Social History Main Topics    Smoking status: Current Every Day Smoker     Packs/day: 1.00     Years: 20.00     Types: Cigarettes    Smokeless tobacco: Never Used    Alcohol use No    Drug use: Not on file    Sexual activity: Not on file     Other Topics Concern    Not on file     Social History Narrative           Vital Signs:  Temp (24hrs) Max:36.9 C (99991111 F)      Systolic (123XX123), [id number] , Min:113 , [id number]     Diastolic (123XX123), 0000000, Min:59, Max:135    Temp  Avg: 36.9 C (98.4 F)  Min: 36.9 C (98.4 F)  Max: 36.9 C (98.4 F)  Pulse  Avg: 120.1  Min: 113  Max: 128  Resp  Avg: 28.4  Min: 19  Max: 33  SpO2  Avg: 95.9 %  Min: 75 %  Max: 100 %  MAP (Non-Invasive)  Avg: 96.2 mmHG  Min: 75 mmHG  Max: 145 mmHG  Pain Score (Numeric, Faces): 0      Hemodynamics:         Current Medications:    Current Facility-Administered Medications:  azithromycin (ZITHROMAX) 500 mg in NS 250 mL IVPB 500 mg Intravenous Q24H   budesonide (PULMICORT RESPULES) 0.5 mg/2 mL nebulizer suspension 0.5 mg Nebulization 2x/day   [START ON 11/12/2015] cefTRIAXone (ROCEPHIN) 1 g in iso-osmotic 50 mL premix IVPB 1 g Intravenous Q24H   doxycycline tablet 100 mg Oral 2x/day   formoterol (PERFOROMIST) 20 mcg/2 mL nebulizer solution 20 mcg Nebulization Q12H   ipratropium (ATROVENT) 0.02% nebulizer solution 0.5 mg Nebulization Q4H WA   levalbuterol (XOPENEX) 1.25 mg/ 3 mL nebulizer solution 1.25 mg Nebulization Q4H WA   methylPREDNISolone sod succ (SOLU-MEDROL) 125 mg/2 mL injection 62.5 mg Intravenous Q6H   NS flush syringe 10 mL Intravenous Q8H       Today's Physical Exam:    Pulmonary Vitals: BP (!) 144/70   Pulse (!) 119   Temp 36.9 C (98.4 F)   Resp (!) 25   Wt 109.8 kg (242 lb)   SpO2 99%   BMI 42.87 kg/m2  General: appears chronically ill and acutely ill  HENT:ENT without erythema or injection,  mucous membranes moist.  Neck: No JVD  Lungs: wheezes throughout all lung fields  Cardiovascular:    Heart S1, S2 normal  Abdomen: soft, non-tender and bowel sounds normal  Extremities: no cyanosis or edema  Skin: Skin warm and dry  Neurologic: CN II - XII grossly intact   Lymphatics: no lymphadenopathy      Labs:  Lab Results for Last 24 Hours:    Results for orders placed or performed during the hospital encounter of 11/11/15 (from the past 24 hour(s))   COMPREHENSIVE METABOLIC PROFILE - [facility] ONLY   Result Value Ref Range    SODIUM 135 (L) 136 - 145 mmol/L    POTASSIUM 4.0 3.4 - 5.1 mmol/L    CHLORIDE 96 (L) 101 - 111 mmol/L    CO2 TOTAL 28 22 - 32 mmol/L    ANION GAP 11 3 - 11 mmol/L    BUN 4 (L) 6 - 20 mg/dL    CREATININE 0.56 0.44 - 1.00 mg/dL    BUN/CREA RATIO 7 6 - 22    ESTIMATED GFR >60 >60 mL/min/1.79m2    ALBUMIN 3.9 3.5 - 5.0 g/dL    CALCIUM 9.4 8.6 - 10.3 mg/dL    GLUCOSE 320 (H) 70 - 110 mg/dL    ALKALINE PHOSPHATASE 100 38 - 126 U/L    ALT (SGPT) 15 14 - 54 U/L    AST (SGOT) 16 15 - 41 U/L    BILIRUBIN TOTAL 0.5 0.3 - 1.2 mg/dL    PROTEIN TOTAL 7.4 6.4 - 8.3 g/dL    ALBUMIN/GLOBULIN RATIO 1.1 0.8 - 2.0   TROPONIN-I   Result Value Ref Range    TROPONIN I <0.03 <=0.06 ng/mL   PT/INR   Result Value Ref Range    PROTHROMBIN TIME 12.4 9.4 - 12.5 seconds    INR 1.13    CBC WITH DIFF   Result Value Ref Range    WBC 14.5 (H) 4.0 - 11.0 x103/uL    RBC 5.32 (H) 4.00 - 5.10 x106/uL    HGB 16.0 (H) 12.0 - 15.5 g/dL  HCT 49.3 (H) 36.0 - 45.0 %    MCV 92.6 82.0 - 97.0 fL    MCH 30.1 27.5 - 33.2 pg    MCHC 32.5 32.0 - 36.0 g/dL    RDW 14.3 11.0 - 16.0 %    PLATELETS 267 150 - 450 x103/uL    MPV 8.4 7.4 - 10.5 fL    NEUTROPHIL % 72 43 - 76 %    LYMPHOCYTE % 16 15 - 43 %    MONOCYTE % 11 5 - 12 %    EOSINOPHIL % 1 0 - 5 %    BASOPHIL % 1 0 - 3 %    NEUTROPHIL # 10.50 (H) 1.50 - 6.50 x103/uL    LYMPHOCYTE # 2.30 1.00 - 4.80 x103/uL    MONOCYTE # 1.50 (H) 0.20 - 0.90 x103/uL    EOSINOPHIL # 0.10 0.00 - 0.50  x103/uL    BASOPHIL # 0.10 0.00 - 0.10 x103/uL   RED TOP TUBE   Result Value Ref Range    RAINBOW/EXTRA TUBE AUTO RESULT Yes    ARTERIAL BLOOD GAS (RESULTS)- [facility]   Result Value Ref Range    PH 7.37 7.35 - 7.45    PCO2 54.50 (H) 35.00 - 45.00 mmHg    PO2 47 (LL) 83 - 108 mmHg    BICARBONATE 31.8 (H) 21.0 - 28.0 mmol/L    BASE EXCESS 4.7 (H) -2.0 - 3.0 mmol/L    O2 SATURATION 80.7 (L) 93.0 - 98.0 %    CO2, TOTAL 33.5 (H) 22.0 - 29.0 mmol/L    SAMPLE TYPE Arterial     DRAW SITE RRadial     ALLEN TEST Positive     DELIVERY SYSTEM Cannula     %FIO2 4 lpm    CRITICAL NOTIFICATION TOMRN     CRITICAL READ BACK Yes     NOTIFICATION DATE/TIME 01-Nov-17        I/O:  I/O last 24 hours:    Intake/Output Summary (Last 24 hours) at 11/11/15 1239  Last data filed at 11/11/15 0950   Gross per 24 hour   Intake              325 ml   Output                0 ml   Net              325 ml     I/O current shift:  11/01 0800 - 11/01 1559  In: 325 [I.V.:325]  Out: -     Drips:   Current Facility-Administered Medications   Medication Dose Frequency Last Rate       Lines (Type and Location)  Date Placed  Date Changed  Necessity Review   1.      2.          Sedation/ Analgesia/ Paralysis:          Radiology Results personally reviewed CXR: increased bibasilar marking     Problem List:  Active Hospital Problems   (*Primary Problem)    Diagnosis    COPD with exacerbation ([place])         Marlis Edelson, MD 11/11/2015, 12:39  Assessment:  AECOPD  Acute type I and II respiratory failure   Smoker   Obesity   SIRS   Hemochromatosis, secondary   Hyperglycemia   COPD  Critical       Recommendations:  BiPAP as ordered   Wean oxygen to keep sat  around 90   Steroids   Rocephin/zithromax   May need phlebotomy, but will reassess in AM   BD's   LABA/ICS   Nicotine patch   Stress and DVT prophylaxis  Sputum cultures    Thank you for allowing me to care for this patient will follow       Chart, imaging and orders reviewed and case discussed with RT, RN,  pharmacy, case management, nutrition and family during  multidisciplinary rounds   Critical Care time: 26

## 2015-11-11 NOTE — ED Nurses Note (Addendum)
Updated ot regarding room assignment.    Rounded on patient.  Reviewed vital signs and treatment plan.  Asked if patient had any needs, especially in the area of toileting, pain management and general comfort.  Addressed issues.  Asked the patient/family if they had any needs before I left the room.  Indicated that I would be back at the earliest convience to evaluate them again and update them on throughput progress.  Call bell within reach, side rails up to pt, stretcher in lowest position.

## 2015-11-11 NOTE — ED Nurses Note (Signed)
Report called to [facility] in the ICU. Pt prepped for transfer to room ICU-14. All personal belongings bagged and transferred with pt.

## 2015-11-11 NOTE — ED Nurses Note (Signed)
Pt placed on 50% venti mask by rt.  O2 sats maintaining 97-99%

## 2015-11-11 NOTE — Progress Notes (Signed)
Goals of Care  ([place]) Documentation    Purpose of Encounter: evaluationdiscussion    Parties in Attendance: Patient    Patient's Decisional Capacity (Name of surrogate if no capacity)  possesses medical decision making capacity YES    Subjective/Patient Story:      Objective/Medical Story:  COPD , DM2, HTN , HLD,     Goals of Care Determinations:  Discussed with the patient  Patient is full code  She would like for her husband to make decisions on her behalf when she is unable to  Willing to go to NH if the need be .    Plan:  Patient Active Problem List   Diagnosis    Dyspnea    COPD exacerbation (HCC)    Obesity (BMI 35.0-39.9 without comorbidity)    Tobacco abuse    Chronic respiratory failure with hypoxia (HCC)    CAP (community acquired pneumonia)    Morbid obesity    COPD with exacerbation ([place])         Code Status:  Code Status Information     Code Status    Full Code          Other documents completed (e.g. Advance directives, POLST, MOLST):  no    Time spent discussing advance care planning (ACP) - required if submitting a charge for an ACP discussion:  20 minutes

## 2015-11-11 NOTE — ED Nurses Note (Signed)
Pt placed on 4L nc, up to 87-90%.  Rt notified.  Pt placed on 15L NRB, sats up to 96-98%

## 2015-11-11 NOTE — ED Provider Notes (Signed)
Malachy Chamber, MD  [facility]  Emergency Department Visit Note    Date:  11/11/2015  Primary care provider:  Emelda Brothers, MD  Means of arrival:  private car  History obtained from: patient  History limited by: none    Chief Complaint:  Shortness of breath    HISTORY OF PRESENT ILLNESS     [name], date of birth 1955/04/09, is a 60 y.o. female, with a history of COPD who presents to the Emergency Department complaining of shortness of breath for one day that worsened this morning. The patient has been experiencing non-productive for three days. No chest pain, measured fevers, or additional complaint. The patient reports that she has oxygen at home, however the tank is empty.    REVIEW OF SYSTEMS     The pertinent positive and negative symptoms are as per HPI. All other systems reviewed and are negative.     PATIENT HISTORY     Past Medical History:  Past Medical History:   Diagnosis Date    COPD (chronic obstructive pulmonary disease) ([place])     Degenerative joint disease involving multiple joints     Diabetes mellitus (HCC)     HTN (hypertension)     Smoker      Past Surgical History:  Past Surgical History:   Procedure Laterality Date    Hx carpal tunnel release         Family History:  Family Medical History     Problem Relation (Age of Onset)    COPD Mother    Coronary Artery Disease Father    Diabetes Mother, Sister, Brother, Paternal Grandmother        Social History:  Social History   Substance Use Topics    Smoking status: Current Every Day Smoker     Packs/day: 1.00     Years: 20.00     Types: Cigarettes    Smokeless tobacco: Never Used    Alcohol use No     History   Drug Use Not on file       Medications:  Outpatient Prescriptions Marked as Taking for the 11/11/15 encounter [facility] Encounter)   Medication Sig    glimepiride (AMARYL) 2 mg Oral Tablet Take 2 mg by mouth Every morning with breakfast    lisinopril (PRINIVIL) 20 mg Oral Tablet Take 20 mg by mouth Once a day     sitaGLIPtin (JANUVIA) 100 mg Oral Tablet Take 100 mg by mouth Once a day       Allergies:  Allergies   Allergen Reactions    Codeine  Other Adverse Reaction (Add comment)     Sick to stomach    Hydrocodone Nausea/ Vomiting    Metformin Diarrhea    Oxycodone Nausea/ Vomiting       PHYSICAL EXAM     Vitals:  Filed Vitals:    11/11/15 0609   BP: (!) 182/79   Pulse: (!) 122   Resp: (!) 30   Temp: 36.9 C (98.4 F)   SpO2: (!) 75%       Pulse ox  75% on None (Room Air) interpreted by me as: Abnormal    Constitutional: The patient is alert and oriented to person, place, and time. Well-developed and well-nourished. Strong smell of tobacco.   HENT: Atraumatic, normocephalic head. Mucous membranes moist. TM's clear, Nares unremarkable. Oropharynx shows no erythema or exudate.   Eyes: Pupils equal and round, reactive to light. No scleral icterus. Normal conjunctiva. Extraocular  movements are intact.  Neck: Supple, non-tender, no nuchal rigidity, no adenopathy.   Lungs: Coarse polyphonic wheezing. Symmetric and equal expansion. No respiratory distress or retractions.  Cardiovascular: Heart is S1-S2 regular rate and regular rhythm without murmur click or rub.  Abdomen:  Soft, non-distended. No tenderness to palpation without evidence of rebound or guarding. No pulsatile masses. No organomegaly.   Genitourinary: No CVA tenderness.  Extremities: Full range of motion, no clubbing, cyanosis  1+ lower extremity edema. Pulses 2+, capillary refill <2 seconds.  Spine: No midline or paraspinal muscle tenderness to palpation. No step-off.   Skin: Warm and dry. No cyanosis, jaundice, rash or lesion.  Neurologic: Alert and oriented x3. Normal facial symmetry and speech, Normal upper and lower extremity strength, and grossly normal sensation.     DIFFERENTIAL DIAGNOSES     1. COPD  2. CHF  3. Pneumonia  4. Flu    DIAGNOSTIC STUDIES     Labs:    Results for orders placed or performed during the hospital encounter of 11/11/15      COMPREHENSIVE METABOLIC PROFILE - [facility] ONLY   Result Value Ref Range    SODIUM 135 (L) 136 - 145 mmol/L    POTASSIUM 4.0 3.4 - 5.1 mmol/L    CHLORIDE 96 (L) 101 - 111 mmol/L    CO2 TOTAL 28 22 - 32 mmol/L    ANION GAP 11 3 - 11 mmol/L    BUN 4 (L) 6 - 20 mg/dL    CREATININE 0.56 0.44 - 1.00 mg/dL    BUN/CREA RATIO 7 6 - 22    ESTIMATED GFR >60 >60 mL/min/1.97m2    ALBUMIN 3.9 3.5 - 5.0 g/dL    CALCIUM 9.4 8.6 - 10.3 mg/dL    GLUCOSE 320 (H) 70 - 110 mg/dL    ALKALINE PHOSPHATASE 100 38 - 126 U/L    ALT (SGPT) 15 14 - 54 U/L    AST (SGOT) 16 15 - 41 U/L    BILIRUBIN TOTAL 0.5 0.3 - 1.2 mg/dL    PROTEIN TOTAL 7.4 6.4 - 8.3 g/dL    ALBUMIN/GLOBULIN RATIO 1.1 0.8 - 2.0   TROPONIN-I   Result Value Ref Range    TROPONIN I <0.03 <=0.06 ng/mL   PT/INR   Result Value Ref Range    PROTHROMBIN TIME 12.4 9.4 - 12.5 seconds    INR 1.13    CBC WITH DIFF   Result Value Ref Range    WBC 14.5 (H) 4.0 - 11.0 x103/uL    RBC 5.32 (H) 4.00 - 5.10 x106/uL    HGB 16.0 (H) 12.0 - 15.5 g/dL    HCT 49.3 (H) 36.0 - 45.0 %    MCV 92.6 82.0 - 97.0 fL    MCH 30.1 27.5 - 33.2 pg    MCHC 32.5 32.0 - 36.0 g/dL    RDW 14.3 11.0 - 16.0 %    PLATELETS 267 150 - 450 x103/uL    MPV 8.4 7.4 - 10.5 fL    NEUTROPHIL % 72 43 - 76 %    LYMPHOCYTE % 16 15 - 43 %    MONOCYTE % 11 5 - 12 %    EOSINOPHIL % 1 0 - 5 %    BASOPHIL % 1 0 - 3 %    NEUTROPHIL # 10.50 (H) 1.50 - 6.50 x103/uL    LYMPHOCYTE # 2.30 1.00 - 4.80 x103/uL    MONOCYTE # 1.50 (H) 0.20 - 0.90 x103/uL  EOSINOPHIL # 0.10 0.00 - 0.50 x103/uL    BASOPHIL # 0.10 0.00 - 0.10 x103/uL     Labs reviewed and interpreted by me.    BLOOD CULTURE - [place] - results pending at time of disposition  BLOOD CULTURE - [place] - results pending at time of disposition    Radiology:    XR CHEST AP PORTABLE - Increased interstitial markings, most predominate right base  Radiological imaging interpreted by me.    EKG:  12 lead EKG interpreted by me shows sinus tachycardia, rate of 131 bpm, normal  axis, nonspecific ST/T wave abnormality.     ED PROGRESS NOTE / [place] records reviewed by me:  I have reviewed the patient's recent past medical history. Nurse's notes reviewed.    Orders Placed This Encounter    ADULT ROUTINE BLOOD CULTURE, SET OF 2 BOTTLES (BACTERIA AND YEAST)    ADULT ROUTINE BLOOD CULTURE, SET OF 2 BOTTLES (BACTERIA AND YEAST)    XR CHEST AP PORTABLE (If patient condition warrants)    COMPREHENSIVE METABOLIC PROFILE - [facility] ONLY    TROPONIN-I    PT/INR    CBC WITH DIFF    ECG 12-LEAD (Take to provider with a brief history)    INSERT & MAINTAIN PERIPHERAL IV ACCESS    NS flush syringe    AND Linked Order Group     albuterol (PROVENTIL) 2.5mg / 0.5 mL nebulizer solution     ipratropium (ATROVENT) 0.02% nebulizer solution    cefTRIAXone (ROCEPHIN) 2 g in NS 50 mL IVPB    azithromycin (ZITHROMAX) 500 mg in NS 250 mL IVPB     Patient was initially treated with Albuterol and Atrovent per nebulizer.  CXR, CBC, CMP, Troponin, PT/INR, and EKG ordered.    6:22 AM - Initial evaluation of the patient complete.    7:09 AM - On recheck, I reviewed the results of the patient's diagnostics. I recommended admission for further treatment of her symptoms. The patient is in agreement. Hospitalist Service paged.    7:11 AM - I discussed the patient's case and above findings with Dr. Wilnette Kales [facility]) who has agreed to make arrangements for admission. Blood cultures added to work. Rocephin IV and Zithromax IV ordered to assist with symptoms.       Pre-Disposition Vitals:  Filed Vitals:    11/11/15 0609 11/11/15 0621   BP: (!) 182/79    Pulse: (!) 122    Resp: (!) 30    Temp: 36.9 C (98.4 F)    SpO2: (!) 75% 98%     1. I have screened the patient for tobacco use and the patient is a tobacco user.  I have counseled the patient for less than 3 minutes to quit using tobacco products due to their multiple adverse health effects.    CLINICAL IMPRESSION     Encounter  Diagnoses   Name Primary?    Chronic respiratory failure with hypoxia (HCC) Yes    Dyspnea     Tobacco abuse      DISPOSITION/PLAN     Admitted        Condition at Disposition: [place], SCRIBE scribed for Malachy Chamber, MD on 11/11/2015 at 6:22 AM.     Documentation assistance provided for Malachy Chamber, MD  by Artemio Aly, [place]. Information recorded by the scribe was done at my direction and  has been reviewed and validated by me Malachy Chamber, MD.

## 2015-11-11 NOTE — ED Nurses Note (Signed)
Rounded on patient.  Reviewed vital signs and treatment plan.  Asked if patient had any needs, especially in the area of toileting, pain management and general comfort.  Addressed issues.  Asked the patient/family if they had any needs before I left the room.  Indicated that I would be back at the earliest convience to evaluate them again and update them on throughput progress.  Call bell within reach, side rails up to pt, stretcher in lowest position.

## 2015-11-11 NOTE — ED Triage Notes (Signed)
PT reports shortness of breath x1 day, worse this morning. Pt reports non-productive cough x3 days. Denies pain.

## 2015-11-11 NOTE — ED Nurses Note (Signed)
Prior to medicating pt I asked the pt their name and birthday. Verified allergies and educated the pt on the possible side effects of the medication being administered. I informed the pt that I would return in 30 minutes to see if the medication has worked. Pt in no signs of distress. Call bell in reach and side rails up.

## 2015-11-11 NOTE — Nurses Notes (Signed)
Husband took belongings of clothing home, glasses, phone and charger remain in room.

## 2015-11-11 NOTE — ED Nurses Note (Signed)
Pt placed on bedside commode with assistance of nursing. Pt tolerated intervention. Upon returning pt to bed pt was placed back on monitoring devices and call bell set with in reach of pt. Prior to labeling specimens I verified pt name and date of birth.

## 2015-11-11 NOTE — Nurses Notes (Signed)
Report to Sharyn Lull, RN next shift.

## 2015-11-12 ENCOUNTER — Inpatient Hospital Stay (HOSPITAL_BASED_OUTPATIENT_CLINIC_OR_DEPARTMENT_OTHER): Payer: BC Managed Care – PPO

## 2015-11-12 DIAGNOSIS — I361 Nonrheumatic tricuspid (valve) insufficiency: Secondary | ICD-10-CM

## 2015-11-12 LAB — CBC WITH DIFF
BASOPHIL #: 0 x10ˆ3/uL (ref 0.00–0.10)
BASOPHIL %: 0 % (ref 0–3)
EOSINOPHIL #: 0 x10ˆ3/uL (ref 0.00–0.50)
EOSINOPHIL %: 0 % (ref 0–5)
HCT: 44.9 % (ref 36.0–45.0)
HGB: 14.9 g/dL (ref 12.0–15.5)
LYMPHOCYTE #: 1.5 x10ˆ3/uL (ref 1.00–4.80)
LYMPHOCYTE %: 15 % (ref 15–43)
MCH: 31.1 pg (ref 27.5–33.2)
MCHC: 33.3 g/dL (ref 32.0–36.0)
MCV: 93.4 fL (ref 82.0–97.0)
MONOCYTE #: 0.4 x10ˆ3/uL (ref 0.20–0.90)
MONOCYTE %: 4 % — ABNORMAL LOW (ref 5–12)
MPV: 8.4 fL (ref 7.4–10.5)
NEUTROPHIL #: 8.1 x10ˆ3/uL — ABNORMAL HIGH (ref 1.50–6.50)
NEUTROPHIL %: 81 % — ABNORMAL HIGH (ref 43–76)
PLATELETS: 249 x10ˆ3/uL (ref 150–450)
RBC: 4.81 x10ˆ6/uL (ref 4.00–5.10)
RDW: 14.3 % (ref 11.0–16.0)
WBC: 10 x10ˆ3/uL (ref 4.0–11.0)

## 2015-11-12 LAB — TROPONIN-I
TROPONIN I: <0.03 ng/mL (ref <=0.06)
TROPONIN I: <0.03 ng/mL (ref <=0.06)

## 2015-11-12 LAB — ECG 12-LEAD
Atrial Rate: 131 {beats}/min
Atrial Rate: 131 {beats}/min
Calculated P Axis: 32 degrees
Calculated R Axis: 81 degrees
Calculated T Axis: -99 degrees
Calculated T Axis: -99 degrees
PR Interval: 114 ms
QRS Duration: 98 ms
QTC Calculation: 383 ms
Ventricular rate: 131 {beats}/min

## 2015-11-12 LAB — PHOSPHORUS: PHOSPHORUS: 4.4 mg/dL (ref 2.7–4.5)

## 2015-11-12 LAB — COMPREHENSIVE METABOLIC PROFILE - BMC/JMC ONLY
ALBUMIN: 3.5 g/dL (ref 3.5–5.0)
ALT (SGPT): 13 U/L — ABNORMAL LOW (ref 14–54)
AST (SGOT): 11 U/L — ABNORMAL LOW (ref 15–41)
CALCIUM: 9.2 mg/dL (ref 8.6–10.3)
CHLORIDE: 97 mmol/L — ABNORMAL LOW (ref 101–111)
CO2 TOTAL: 30 mmol/L (ref 22–32)
CREATININE: 0.47 mg/dL (ref 0.44–1.00)
ESTIMATED GFR: >60 mL/min/1.73mˆ2 (ref >60)
POTASSIUM: 4.3 mmol/L (ref 3.4–5.1)

## 2015-11-12 LAB — SPUTUM SCREEN

## 2015-11-12 LAB — HGA1C (HEMOGLOBIN A1C WITH EST AVG GLUCOSE)
ESTIMATED AVERAGE GLUCOSE: 269 mg/dL — ABNORMAL HIGH (ref 70–110)
GLYCOHEMOGLOBIN (HBA1C): 11 % — ABNORMAL HIGH (ref 4.0–6.0)

## 2015-11-12 LAB — POC FINGERSTICK GLUCOSE - BMC/JMC (RESULTS)
GLUCOSE, POC: 297 mg/dL — ABNORMAL HIGH (ref 60–100)
GLUCOSE, POC: 379 mg/dL — ABNORMAL HIGH (ref 60–100)
GLUCOSE, POC: 379 mg/dL — ABNORMAL HIGH (ref 60–100)

## 2015-11-12 MED ORDER — INSULIN GLARGINE (U-100) 100 UNIT/ML SUBCUTANEOUS SOLUTION
15.0000 [IU] | Freq: Every day | SUBCUTANEOUS | Status: DC
Start: 2015-11-13 — End: 2015-11-13

## 2015-11-12 MED ORDER — INSULIN GLARGINE (U-100) 100 UNIT/ML SUBCUTANEOUS SOLUTION
10.0000 [IU] | Freq: Every day | SUBCUTANEOUS | Status: DC
Start: 2015-11-12 — End: 2015-11-12
  Administered 2015-11-12: 10 [IU] via SUBCUTANEOUS
  Filled 2015-11-12: qty 200

## 2015-11-12 MED ORDER — CEFTRIAXONE 1 GRAM/50 ML IN DEXTROSE (ISO-OSMOT) INTRAVENOUS PIGGYBACK
1.0000 g | INJECTION | INTRAVENOUS | Status: DC
Start: 2015-11-12 — End: 2015-11-14
  Administered 2015-11-12: 0 g via INTRAVENOUS
  Administered 2015-11-12 – 2015-11-14 (×3): 1 g via INTRAVENOUS
  Administered 2015-11-14: 0 g via INTRAVENOUS
  Filled 2015-11-12 (×4): qty 50

## 2015-11-12 MED ORDER — LISINOPRIL 40 MG TABLET
40.0000 mg | ORAL_TABLET | Freq: Every day | ORAL | Status: DC
Start: 2015-11-13 — End: 2015-11-14
  Administered 2015-11-13 – 2015-11-14 (×2): 40 mg via ORAL
  Filled 2015-11-12 (×2): qty 1

## 2015-11-12 MED ORDER — PERFLUTREN LIPID MICROSPHERES 1.1 MG/ML INTRAVENOUS SUSPENSION
0.30 mL | INTRAVENOUS | Status: AC
Start: 2015-11-12 — End: 2015-11-12
  Administered 2015-11-12: 0.3 mL via INTRAVENOUS

## 2015-11-12 MED ORDER — LISINOPRIL 20 MG TABLET
20.0000 mg | ORAL_TABLET | Freq: Once | ORAL | Status: AC
Start: 2015-11-12 — End: 2015-11-12
  Administered 2015-11-12: 20 mg via ORAL
  Filled 2015-11-12: qty 1

## 2015-11-12 MED ORDER — METOPROLOL TARTRATE 25 MG TABLET
12.50 mg | ORAL_TABLET | Freq: Two times a day (BID) | ORAL | Status: DC
Start: 2015-11-12 — End: 2015-11-12

## 2015-11-12 MED ORDER — DEXTROSE 50 % IN WATER (D50W) INTRAVENOUS SYRINGE
12.50 g | INJECTION | INTRAVENOUS | Status: DC | PRN
Start: 2015-11-12 — End: 2015-11-14

## 2015-11-12 MED ORDER — INSULIN REGULAR HUMAN 100 UNIT/ML INJECTION SSIP - CITY
2.00 [IU] | Freq: Four times a day (QID) | INTRAMUSCULAR | Status: DC
Start: 2015-11-12 — End: 2015-11-14
  Administered 2015-11-12 (×2): 8 [IU] via SUBCUTANEOUS
  Administered 2015-11-13: 5 [IU] via SUBCUTANEOUS
  Administered 2015-11-13: 7 [IU] via SUBCUTANEOUS
  Administered 2015-11-13: 5 [IU] via SUBCUTANEOUS
  Administered 2015-11-14: 0 [IU] via SUBCUTANEOUS
  Administered 2015-11-14: 3 [IU] via SUBCUTANEOUS

## 2015-11-12 MED ORDER — CARVEDILOL 6.25 MG TABLET
6.25 mg | ORAL_TABLET | Freq: Two times a day (BID) | ORAL | Status: DC
Start: 2015-11-12 — End: 2015-11-14
  Administered 2015-11-12 – 2015-11-14 (×5): 6.25 mg via ORAL
  Filled 2015-11-12 (×5): qty 1

## 2015-11-12 MED ORDER — AZITHROMYCIN 250 MG TABLET
500.0000 mg | ORAL_TABLET | Freq: Every day | ORAL | Status: DC
Start: 2015-11-13 — End: 2015-11-14
  Administered 2015-11-13 – 2015-11-14 (×2): 500 mg via ORAL
  Filled 2015-11-12 (×2): qty 2

## 2015-11-12 MED ORDER — METHYLPREDNISOLONE SOD SUCCINATE 40 MG/ML SOLUTION FOR INJ. WRAPPER
40.00 mg | Freq: Four times a day (QID) | INTRAMUSCULAR | Status: DC
Start: 2015-11-12 — End: 2015-11-13
  Administered 2015-11-12 – 2015-11-13 (×5): 40 mg via INTRAVENOUS
  Filled 2015-11-12 (×5): qty 1

## 2015-11-12 MED ADMIN — sodium chloride 0.9 % (flush) injection syringe: INTRAVENOUS | @ 14:00:00

## 2015-11-12 MED ADMIN — methylPREDNISolone sodium succinate 40 mg solution for injection: INTRAVENOUS | @ 17:00:00

## 2015-11-12 MED ADMIN — ADULT CUSTOM CYCLIC PARENTERAL NUTRITION: INTRAVENOUS | @ 12:00:00 | NDC 00264934155

## 2015-11-12 MED ADMIN — sodium chloride 0.9 % intravenous solution: RESPIRATORY_TRACT | @ 15:00:00 | NDC 00338004904

## 2015-11-12 NOTE — Care Management Notes (Signed)
Chart reviewed.  Noted that pt has empty O2 tanks at home, and then noted that pt doesn't have home O2.  Past admissions reviewed.  Pt received home O2 in 2014 by [employer].  Called and spoke with Kenney Houseman [facility]).  She confirmed that pt received home O2 in 2014.  I instructed that pt voiced tanks are empty.  She will call pt/family and schedule time to check tanks.

## 2015-11-12 NOTE — Nurses Notes (Signed)
Patient resting, breathing unlabored on bipap, denies pain, shortness of breath. Will continue to monitor.

## 2015-11-12 NOTE — Ancillary Notes (Signed)
2 D echo was done complete performed by Raechel Ache RDCS

## 2015-11-12 NOTE — Nurses Notes (Signed)
Report to Sande Brothers, RN for room 631

## 2015-11-12 NOTE — Progress Notes (Signed)
[facility]  [place], [place] [address]    IP PROGRESS NOTE      [name],[name]  Date of Admission:  11/11/2015  Date of Birth:  [birth year]  Date of Service:  11/12/2015    Chief Complaint:  Says she is feeling much better  Subjective:     Vital Signs:  Temp (24hrs) Max:37.2 C (98.9 F)      Temperature: 36.3 C (97.4 F)  BP (Non-Invasive): (!) 167/77  MAP (Non-Invasive): 91 mmHG  Heart Rate: (!) 107  Respiratory Rate: (!) 23  Pain Score (Numeric, Faces): 0  SpO2-1: 91 %    Current Medications:    Current Facility-Administered Medications:  acetaminophen (TYLENOL) tablet 650 mg Oral Q6H PRN   [START ON 11/13/2015] azithromycin (ZITHROMAX) tablet 500 mg Oral Daily   budesonide (PULMICORT RESPULES) 0.5 mg/2 mL nebulizer suspension 0.5 mg Nebulization 2x/day   carvedilol (COREG) tablet 6.25 mg Oral 2x/day-Food   cefTRIAXone (ROCEPHIN) 1 g in iso-osmotic 50 mL premix IVPB 1 g Intravenous Q24H   SSIP insulin R human 100 units/mL injection 1-8 Units Subcutaneous 4x/day AC   And      dextrose 50% (0.5 g/mL) injection - syringe 12.5 g Intravenous Q15 Min PRN   enoxaparin (LOVENOX) 40 mg/0.4 mL SubQ injection 40 mg Subcutaneous Daily   formoterol (PERFOROMIST) 20 mcg/2 mL nebulizer solution 20 mcg Nebulization Q12H   hydrALAZINE (APRESOLINE) injection 10 mg 10 mg Intravenous Q6H PRN   insulin glargine (LANTUS) 100 units/mL injection 10 Units Subcutaneous Daily   ipratropium (ATROVENT) 0.02% nebulizer solution 0.5 mg Nebulization Q4H WA   levalbuterol (XOPENEX) 1.25 mg/ 3 mL nebulizer solution 1.25 mg Nebulization Q4H WA   linagliptin (TRADJENTA) tablet 5 mg Oral Daily   lisinopril (PRINIVIL) tablet 20 mg Oral Daily   methylPREDNISolone sod succ (SOLU-MEDROL) 40 mg/mL injection 40 mg Intravenous Q6H   nicotine (NICODERM CQ) transdermal patch (mg/24 hr) 21 mg Transdermal Daily   nitroGLYCERIN (NITROSTAT) sublingual tablet 0.4 mg Sublingual Q5 Min PRN   NS flush syringe 10 mL Intravenous Q8H   NS flush  syringe 10 mL Intravenous Q8HRS   NS flush syringe 10 mL Intravenous Q1H PRN   pantoprazole (PROTONIX) delayed release tablet 40 mg Oral Daily       Today's Physical Exam:  General:  Appears chronically ill, older than stated age, acutely ill, short of breath on conversing  Eyes: Pupils equal and round, reactive to light and accomodation.   HEENT: Head atraumatic and normocephalic   Neck: No JVD or thyromegaly or lymphadenopathy   Lungs:  Decreased air entry, wheezing, no Crepts   Cardiovascular: regular rate and rhythm tachycardia, S1, S2 normal, no murmur  Abdomen: Soft, non-tender, Bowel sounds normal, No hepatosplenomegaly   Extremities: extremities normal, atraumatic, no cyanosis or edema   Skin: Skin warm and dry   Neurologic: Grossly normal , nonfocal  Lymphatics: No lymphadenopathy   Psychiatric: Normal affect, behavior,     I/O:  I/O last 24 hours:    Intake/Output Summary (Last 24 hours) at 11/12/15 1355  Last data filed at 11/12/15 1300   Gross per 24 hour   Intake             1115 ml   Output             2550 ml   Net            -1435 ml     I/O current shift:  11/02 0800 -  11/02 1559  In: 695 [P.O.:360; I.[name].:335]  Out: 750 [Urine:750]      Labs  Please indicate ordered or reviewed)  Reviewed: I have reviewed all lab results.        Radiology Tests (Please indicate ordered or reviewed)  Reviewed: CXR:  Report noted  IMPRESSION:  IMPRESSION:  No radiographic evidence for acute cardiopulmonary disease.  COPD    Problem List:  Active Hospital Problems   (*Primary Problem)    Diagnosis    COPD with exacerbation (HCC)       Assessment/ Plan:     1.  acute exacerbation of COPD:     was on BiPAP overnight, now on NC      Start on IV Solu-Medrol Duo nebs incentive spirometry     CW  Rocephin and doxycycline     Chest x-ray noted     Pulmonary consultation appreciated    2.  Acute on chronic hypercapnic and hypoxemic respiratory failure:       Secondary to acute exacerbation of COPD:       Treat as #1        Likely patient will need O2 on discharge    3. Hypertension:      Uncontrolled,      Continue with coreg , lisinopril,  But increase dose       P.r.n. Hydralazine    4. History of diabetes mellitus type 2:      Blood sugars running high secondary to steroids       Continue linagliptin, increase to high dose sliding scale insulin        Lantus 15 units     5. History of hyperlipidemia:       Continue with atorvastatin     Check troponin, so far negative      DVT and GI prophylaxis. Lovenox and protonix.  Code status.  Discussed with the patient Full code.

## 2015-11-12 NOTE — Nurses Notes (Signed)
Patient placed on 5L HFNC. Patient denies shortness of breath. Will continue to monitor.

## 2015-11-12 NOTE — Nurses Notes (Signed)
Transported by bed on O2 5L high flow nasal cannula to room 631 accompanied by Mitzi Hansen, Biomedical scientist and Kit, CNA. Belongings of glasses, phone and charger accompanied. Attempted to call husband with no answer, wife will try later. She informed me he sometimes does not answer due to hard of hearing.

## 2015-11-12 NOTE — Nurses Notes (Signed)
Assumed care of patient at this time. Patient ambulated from the wheelchair to the bed independently. In no apparent distress at this time. No complaints of pain or shortness of breath. Patient is tearful and states that she can't wait to go home. Oriented to room and given call bell. Will continue to monitor.

## 2015-11-12 NOTE — Care Plan (Signed)
Problem: Patient Care Overview (Adult,OB)  Goal: Plan of Care Review(Adult,OB)  The patient and/or their representative will communicate an understanding of their plan of care   Outcome: Ongoing (see interventions/notes)  Goal: Individualization/Patient Specific Goal(Adult/OB)  Outcome: Ongoing (see interventions/notes)  Goal: Interdisciplinary Rounds/Family Conf  Outcome: Ongoing (see interventions/notes)    Problem: Chronic Obstructive Pulmonary Disease (Adult)  Prevent and manage potential problems including: 1. functional deficit 2. infection 3. respiratory compromise 4. situational response 5. undernutrition   Intervention: Reduce/Relieve Breathlessness    11/12/15 0030   Coping/Psychosocial Interventions   Environmental Support calm environment promoted;environmental consistency promoted;rest periods encouraged       Intervention: Prevent/Manage DVT/VTE Risk    11/12/15 0400   Minimize Embolism Risk   VTE Prevention/Management anticoagulant therapy maintained       Intervention: Prevent/Manage Infection    11/11/15 2030   Prevent/Manage Colorectal Surgical Infection   Fever Reduction/Comfort Measures lightweight bedding;lightweight clothing       Intervention: Optimize Functional Ability/Increase Activity Tolerance    11/12/15 0400   Activity   Activity Management bedrest with commode       Intervention: Optimize Oxygenation/Ventilation/Perfusion    11/12/15 0400   Positioning   Head of Bed (HOB) HOB at 30 degrees       Intervention: Support/Optimize Psychosocial Response to Chronic Pulmonary Disease    11/12/15 [place] Relationship/Rapport care explained;choices provided;emotional support provided;empathic listening provided;questions answered;questions encouraged;reassurance provided;thoughts/feelings acknowledged   Coping/Psychosocial Interventions   Supportive Measures active listening utilized;positive reinforcement provided         Goal: Signs and Symptoms of Listed Potential  Problems Will be Absent, Minimized or Managed (Chronic Obstructive Pulmonary Disease)  Signs and symptoms of listed potential problems will be absent, minimized or managed by discharge/transition of care (reference Chronic Obstructive Pulmonary Disease (Adult) CPG).   Outcome: Ongoing (see interventions/notes)    Problem: Fall Risk (Adult)  Intervention: Monitor/Assist with Self Care    11/12/15 0400   Activity   Activity Assistance Provided assistance, 1 person       Intervention: Reduce Risk/Promote Restraint Free Environment    11/12/15 0400   Safety Interventions   Environmental Safety Modification assistive device/personal items within reach;clutter free environment maintained;lighting adjusted;room organization consistent   Safety Promotion/Fall Prevention activity supervised;fall prevention program maintained;nonskid shoes/slippers when out of bed;safety round/check completed       Intervention: Review Medications/Identify Contributors to Fall Risk    11/12/15 0400   Safety Interventions   Medication Review/Management medications reviewed         Goal: Identify Related Risk Factors and Signs and Symptoms  Related risk factors and signs and symptoms are identified upon initiation of Human Response Clinical Practice Guideline (CPG).   Outcome: Ongoing (see interventions/notes)  Goal: Absence of Falls  Patient will demonstrate the desired outcomes by discharge/transition of care.   Outcome: Ongoing (see interventions/notes)    11/12/15 0530   Fall Risk (Adult)   Absence of Falls making progress toward outcome         Problem: Skin Injury Risk (Adult,Obstetrics,Pediatric)  Intervention: Prevent/Manage Excess Moisture    11/11/15 1400 11/12/15 0030   Hygiene Care   Perineal Care perineum cleansed --    Bathing/Skin Care --  per patient   Skin Interventions   Skin Protection --  adhesive use limited;transparent dressing maintained       Intervention: Maintain Head of Bed Elevation Less Than 30 Degrees as  Tolerated  11/12/15 0400   Positioning   Head of Bed (HOB) HOB at 30 degrees       Intervention: Prevent/Minimize Shear/Friction Injuries    11/12/15 0400   Skin Interventions   Pressure Reduction Devices pressure-redistributing mattress utilized   Positioning   Positioning/Transfer Devices pillows;in use       Intervention: Prevent/Minimize Pressure    11/12/15 0400   Skin Interventions   Pressure Reduction Techniques frequent weight shift encouraged   Positioning   Body Position positioned/repositioned independently         Goal: Identify Related Risk Factors and Signs and Symptoms  Related risk factors and signs and symptoms are identified upon initiation of Human Response Clinical Practice Guideline (CPG).   Outcome: Ongoing (see interventions/notes)  Goal: Skin Health and Integrity  Patient will demonstrate the desired outcomes by discharge/transition of care.   Outcome: Ongoing (see interventions/notes)    11/12/15 0530   Skin Injury Risk (Adult,Obstetrics,Pediatric)   Skin Health and Integrity making progress toward outcome

## 2015-11-12 NOTE — Nurses Notes (Signed)
Report received from [facility]. Assumed care of patient.

## 2015-11-12 NOTE — Pharmacy (Signed)
[place] Note- Multidisciplinary Rounds                                                 [name], [name], 60 y.o. female  Date of Admission:  11/11/2015  Date of service: 11/12/2015  Date of Birth:  Apr 29, 1955    ASSESSMENT/PLAN:    Neuro- No recommendations.      Cardio- Patient was hypertensive and tachycardic during rounds. Low dose coreg was initiated at this time.    Pulm- The patient is ordered levalbuterol/ipratropium nebs Q4H, budesonide nebs, formoterol nebs, and a nicotine patch. Solu-medrol was tapered down to 40 mg IV Q6H. No recommendations.    GI- No recommendations.    Endocrine- Fingerstick glucose values have been > 200 mg/dL since admission. Attending added lantus 10 units SUBC daily this morning. Will monitor.    Renal- No recommendations.    Heme- No recommendations    ID- Today is day 2 of ceftriaxone and azithromycin. RSV (+). Blood cultures are negative. Sputum culture is pending. Azithromycin was changed from IV to PO per protocol.     F/E/N- Diabetic diet    Prophylaxis-   DVT prophylaxis- Lovenox 40 mg SUBC daily    GI prophylaxis- Protonix 40 mg PO daily    Subjective: 60 YO female who presented to the ER with SOB and cough. Patient was started on BiPAP and admitted to ICU. Pertinent PMH includes COPD and tobacco abuse.    Objective:     Current height: Height: 5\' 3"   Current weight: Weight: 89.8 kg (197 lb 15.6 oz)  Dosing weight:  67.4 kg     Vital Signs:  Temp (24hrs) Max:37.2 C (XX123456 F)      Systolic (123XX123), 123456 , Min:113 , [id number]     Diastolic (123XX123), 123XX123, Min:69, Max:110    Temp  Avg: 36.8 C (98.2 F)  Min: 36.3 C (97.4 F)  Max: 37.2 C (98.9 F)  Pulse  Avg: 109.9  Min: 87  Max: 127  Resp  Avg: 25.7  Min: 18  Max: 34  SpO2  Avg: 94 %  Min: 86 %  Max: 100 %  MAP (Non-Invasive)  Avg: 101.4 mmHG  Min: 83 mmHG  Max: 131 mmHG  Pain Score (Numeric, Faces): 0    Labs:  I have reviewed all lab results.  Lab Results for Last 24 Hours:    Results for orders  placed or performed during the hospital encounter of 11/11/15 (from the past 24 hour(s))   RESPIRATORY VIRUS PANEL BY BIOFIRE FILM ARRAY   Result Value Ref Range    ADENOVIRUS ARRAY Not Detected Not Detected    CORONAVIRUS 229E Not Detected Not Detected    CORONAVIRUS HKU1 Not Detected Not Detected    CORONAVIRUS NL63 Not Detected Not Detected    CORONAVIRUS OC43 Not Detected Not Detected    METAPNEUMOVIRUS ARRAY Not Detected Not Detected    RHINOVIRUS/ENTEROVIRUS ARRAY Detected (A) Not Detected    INFLUENZA A Not Detected Not Detected    INFLUENZA B ARRAY Not Detected Not Detected    PARAINFLUENZA 1 ARRAY Not Detected Not Detected    PARAINFLUENZA 2 ARRAY Not Detected Not Detected    PARAINFLUENZA 3 ARRAY Not Detected Not Detected    PARAINFLUENZA 4 ARRAY Not Detected Not Detected    RSV ARRAY Not Detected  Not Detected    BORDETELLA PERTUSSIS ARRAY Not Detected Not Detected    CHLAMYDOPHILA PNEUMONIAE ARRAY Not Detected Not Detected    MYCOPLASMA PNEUMONIAE ARRAY Not Detected Not Detected   POC FINGERSTICK GLUCOSE - [facility] (RESULTS)   Result Value Ref Range    GLUCOSE, POC 312 (H) 60 - 100 mg/dL   TROPONIN-I   Result Value Ref Range    TROPONIN I <0.03 <=0.06 ng/mL   POC FINGERSTICK GLUCOSE - [facility] (RESULTS)   Result Value Ref Range    GLUCOSE, POC 350 (H) 60 - 100 mg/dL   TROPONIN-I   Result Value Ref Range    TROPONIN I <0.03 <=0.06 ng/mL   COMPREHENSIVE METABOLIC PROFILE - [facility] ONLY   Result Value Ref Range    SODIUM 136 136 - 145 mmol/L    POTASSIUM 4.3 3.4 - 5.1 mmol/L    CHLORIDE 97 (L) 101 - 111 mmol/L    CO2 TOTAL 30 22 - 32 mmol/L    ANION GAP 9 3 - 11 mmol/L    BUN 13 6 - 20 mg/dL    CREATININE 0.47 0.44 - 1.00 mg/dL    BUN/CREA RATIO 28 (H) 6 - 22    ESTIMATED GFR >60 >60 mL/min/1.71m2    ALBUMIN 3.5 3.5 - 5.0 g/dL    CALCIUM 9.2 8.6 - 10.3 mg/dL    GLUCOSE 322 (H) 70 - 110 mg/dL    ALKALINE PHOSPHATASE 75 38 - 126 U/L    ALT (SGPT) 13 (L) 14 - 54 U/L    AST (SGOT) 11 (L) 15 - 41 U/L    BILIRUBIN  TOTAL 0.5 0.3 - 1.2 mg/dL    PROTEIN TOTAL 6.6 6.4 - 8.3 g/dL    ALBUMIN/GLOBULIN RATIO 1.1 0.8 - 2.0   CBC WITH DIFF   Result Value Ref Range    WBC 10.0 4.0 - 11.0 x103/uL    RBC 4.81 4.00 - 5.10 x106/uL    HGB 14.9 12.0 - 15.5 g/dL    HCT 44.9 36.0 - 45.0 %    MCV 93.4 82.0 - 97.0 fL    MCH 31.1 27.5 - 33.2 pg    MCHC 33.3 32.0 - 36.0 g/dL    RDW 14.3 11.0 - 16.0 %    PLATELETS 249 150 - 450 x103/uL    MPV 8.4 7.4 - 10.5 fL    NEUTROPHIL % 81 (H) 43 - 76 %    LYMPHOCYTE % 15 15 - 43 %    MONOCYTE % 4 (L) 5 - 12 %    EOSINOPHIL % 0 0 - 5 %    BASOPHIL % 0 0 - 3 %    NEUTROPHIL # 8.10 (H) 1.50 - 6.50 x103/uL    LYMPHOCYTE # 1.50 1.00 - 4.80 x103/uL    MONOCYTE # 0.40 0.20 - 0.90 x103/uL    EOSINOPHIL # 0.00 0.00 - 0.50 x103/uL    BASOPHIL # 0.00 0.00 - 0.10 x103/uL   MAGNESIUM   Result Value Ref Range    MAGNESIUM 2.1 1.4 - 2.1 mg/dL   PHOSPHORUS   Result Value Ref Range    PHOSPHORUS 4.4 2.7 - 4.5 mg/dL   HGA1C (HEMOGLOBIN A1C WITH EST AVG GLUCOSE)   Result Value Ref Range    ESTIMATED AVERAGE GLUCOSE 269 (H) 70 - 110 mg/dL    GLYCOHEMOGLOBIN (HBA1C) 11.0 (H) 4.0 - 6.0 %   POC FINGERSTICK GLUCOSE - [facility] (RESULTS)   Result Value Ref Range    GLUCOSE,  POC 297 (H) 60 - 100 mg/dL   TROPONIN-I   Result Value Ref Range    TROPONIN I <0.03 <=0.06 ng/mL   POC FINGERSTICK GLUCOSE - [facility] (RESULTS)   Result Value Ref Range    GLUCOSE, POC 358 (H) 60 - 100 mg/dL     CrCl: 137.1 mL/min    Current Medications:    Current Facility-Administered Medications:  acetaminophen (TYLENOL) tablet 650 mg Oral Q6H PRN   [START ON 11/13/2015] azithromycin (ZITHROMAX) tablet 500 mg Oral Daily   budesonide (PULMICORT RESPULES) 0.5 mg/2 mL nebulizer suspension 0.5 mg Nebulization 2x/day   carvedilol (COREG) tablet 6.25 mg Oral 2x/day-Food   cefTRIAXone (ROCEPHIN) 1 g in iso-osmotic 50 mL premix IVPB 1 g Intravenous Q24H   SSIP insulin R human 100 units/mL injection 1-8 Units Subcutaneous 4x/day AC   And      dextrose 50% (0.5  g/mL) injection - syringe 12.5 g Intravenous Q15 Min PRN   enoxaparin (LOVENOX) 40 mg/0.4 mL SubQ injection 40 mg Subcutaneous Daily   formoterol (PERFOROMIST) 20 mcg/2 mL nebulizer solution 20 mcg Nebulization Q12H   hydrALAZINE (APRESOLINE) injection 10 mg 10 mg Intravenous Q6H PRN   insulin glargine (LANTUS) 100 units/mL injection 10 Units Subcutaneous Daily   ipratropium (ATROVENT) 0.02% nebulizer solution 0.5 mg Nebulization Q4H WA   levalbuterol (XOPENEX) 1.25 mg/ 3 mL nebulizer solution 1.25 mg Nebulization Q4H WA   linagliptin (TRADJENTA) tablet 5 mg Oral Daily   lisinopril (PRINIVIL) tablet 20 mg Oral Daily   methylPREDNISolone sod succ (SOLU-MEDROL) 40 mg/mL injection 40 mg Intravenous Q6H   nicotine (NICODERM CQ) transdermal patch (mg/24 hr) 21 mg Transdermal Daily   nitroGLYCERIN (NITROSTAT) sublingual tablet 0.4 mg Sublingual Q5 Min PRN   NS flush syringe 10 mL Intravenous Q8H   NS flush syringe 10 mL Intravenous Q8HRS   NS flush syringe 10 mL Intravenous Q1H PRN   pantoprazole (PROTONIX) delayed release tablet 40 mg Oral Daily       I/O:  I/O last 24 hours:    Intake/Output Summary (Last 24 hours) at 11/12/15 1309  Last data filed at 11/12/15 1100   Gross per 24 hour   Intake              995 ml   Output             2550 ml   Net            -1555 ml     I/O current shift:  11/02 0800 - 11/02 1559  In: 575 [P.O.:240; I.V.:335]  Out: 750 [Urine:750]    Thank you!  Leveda Anna, PHARMD   11/12/2015, 13:09

## 2015-11-12 NOTE — Progress Notes (Signed)
[facility]  [place], [place] [address]      Pulmonary Consult Follow     [place], [name], 60 y.o. female  Date of Service: 11/12/2015  Date of Birth:  10/21/1955      Chief Complaint:  SOB  Subjective: Feeling better off BiPAP this morning, states that she is using her oxygen at home   Review of Systems - all ROS reviewed and  other than what is mentioned in HPI,  All other ROS Negative    Past Medical History  No current outpatient prescriptions on file.     Allergies   Allergen Reactions    Codeine  Other Adverse Reaction (Add comment)     Sick to stomach    Hydrocodone Nausea/ Vomiting    Metformin Diarrhea    Oxycodone Nausea/ Vomiting     Past Medical History:   Diagnosis Date    COPD (chronic obstructive pulmonary disease) (HCC)     Degenerative joint disease involving multiple joints     Diabetes mellitus (HCC)     HTN (hypertension)     Smoker          Past Surgical History:   Procedure Laterality Date    HX CARPAL TUNNEL RELEASE           Family Medical History     Problem Relation (Age of Onset)    COPD Mother    Coronary Artery Disease Father    Diabetes Mother, Sister, Brother, Paternal Grandmother            Social History     Social History    Marital status: Married     Spouse name: N/A    Number of children: N/A    Years of education: N/A     Social History Main Topics    Smoking status: Current Every Day Smoker     Packs/day: 1.00     Years: 20.00     Types: Cigarettes    Smokeless tobacco: Never Used    Alcohol use No    Drug use: Not on file    Sexual activity: Not on file     Other Topics Concern    Not on file     Social History Narrative       Objective:  Temperature: 36.3 C (97.4 F)  Heart Rate: (!) 109  BP (Non-Invasive): (!) 164/77  Respiratory Rate: (!) 23  SpO2-1: 90 %  Pain Score (Numeric, Faces): 0    Pulmonary Vitals: BP (!) 164/77   Pulse (!) 109   Temp 36.3 C (97.4 F)   Resp (!) 23   Ht 1.6 m (5\' 3" )   Wt 89.8 kg (197 lb 15.6 oz)   SpO2  90%   BMI 35.07 kg/m2  General: appears chronically ill and moderately obese  HENT:ENT without erythema or injection, mucous membranes moist.  Lungs: decreased breath sounds throughout all lung fields  Cardiovascular:    Heart S1, S2 normal  Abdomen: soft, non-tender and bowel sounds normal  Extremities: no cyanosis or edema  Skin: Skin warm and dry  Neurologic: CN II - XII grossly intact   Lymphatics: no lymphadenopathy      Labs:    Lab Results for Last 24 Hours:    Results for orders placed or performed during the hospital encounter of 11/11/15 (from the past 24 hour(s))   POC FINGERSTICK GLUCOSE - [facility] (RESULTS)   Result Value Ref Range    GLUCOSE, POC 239 (  H) 60 - 100 mg/dL   RESPIRATORY VIRUS PANEL BY BIOFIRE FILM ARRAY   Result Value Ref Range    ADENOVIRUS ARRAY Not Detected Not Detected    CORONAVIRUS 229E Not Detected Not Detected    CORONAVIRUS HKU1 Not Detected Not Detected    CORONAVIRUS NL63 Not Detected Not Detected    CORONAVIRUS OC43 Not Detected Not Detected    METAPNEUMOVIRUS ARRAY Not Detected Not Detected    RHINOVIRUS/ENTEROVIRUS ARRAY Detected (A) Not Detected    INFLUENZA A Not Detected Not Detected    INFLUENZA B ARRAY Not Detected Not Detected    PARAINFLUENZA 1 ARRAY Not Detected Not Detected    PARAINFLUENZA 2 ARRAY Not Detected Not Detected    PARAINFLUENZA 3 ARRAY Not Detected Not Detected    PARAINFLUENZA 4 ARRAY Not Detected Not Detected    RSV ARRAY Not Detected Not Detected    BORDETELLA PERTUSSIS ARRAY Not Detected Not Detected    CHLAMYDOPHILA PNEUMONIAE ARRAY Not Detected Not Detected    MYCOPLASMA PNEUMONIAE ARRAY Not Detected Not Detected   POC FINGERSTICK GLUCOSE - [facility] (RESULTS)   Result Value Ref Range    GLUCOSE, POC 312 (H) 60 - 100 mg/dL   TROPONIN-I   Result Value Ref Range    TROPONIN I <0.03 <=0.06 ng/mL   POC FINGERSTICK GLUCOSE - [facility] (RESULTS)   Result Value Ref Range    GLUCOSE, POC 350 (H) 60 - 100 mg/dL   TROPONIN-I   Result Value Ref Range    TROPONIN I  <0.03 <=0.06 ng/mL   COMPREHENSIVE METABOLIC PROFILE - [facility] ONLY   Result Value Ref Range    SODIUM 136 136 - 145 mmol/L    POTASSIUM 4.3 3.4 - 5.1 mmol/L    CHLORIDE 97 (L) 101 - 111 mmol/L    CO2 TOTAL 30 22 - 32 mmol/L    ANION GAP 9 3 - 11 mmol/L    BUN 13 6 - 20 mg/dL    CREATININE 0.47 0.44 - 1.00 mg/dL    BUN/CREA RATIO 28 (H) 6 - 22    ESTIMATED GFR >60 >60 mL/min/1.46m2    ALBUMIN 3.5 3.5 - 5.0 g/dL    CALCIUM 9.2 8.6 - 10.3 mg/dL    GLUCOSE 322 (H) 70 - 110 mg/dL    ALKALINE PHOSPHATASE 75 38 - 126 U/L    ALT (SGPT) 13 (L) 14 - 54 U/L    AST (SGOT) 11 (L) 15 - 41 U/L    BILIRUBIN TOTAL 0.5 0.3 - 1.2 mg/dL    PROTEIN TOTAL 6.6 6.4 - 8.3 g/dL    ALBUMIN/GLOBULIN RATIO 1.1 0.8 - 2.0   CBC WITH DIFF   Result Value Ref Range    WBC 10.0 4.0 - 11.0 x103/uL    RBC 4.81 4.00 - 5.10 x106/uL    HGB 14.9 12.0 - 15.5 g/dL    HCT 44.9 36.0 - 45.0 %    MCV 93.4 82.0 - 97.0 fL    MCH 31.1 27.5 - 33.2 pg    MCHC 33.3 32.0 - 36.0 g/dL    RDW 14.3 11.0 - 16.0 %    PLATELETS 249 150 - 450 x103/uL    MPV 8.4 7.4 - 10.5 fL    NEUTROPHIL % 81 (H) 43 - 76 %    LYMPHOCYTE % 15 15 - 43 %    MONOCYTE % 4 (L) 5 - 12 %    EOSINOPHIL % 0 0 - 5 %    BASOPHIL %  0 0 - 3 %    NEUTROPHIL # 8.10 (H) 1.50 - 6.50 x103/uL    LYMPHOCYTE # 1.50 1.00 - 4.80 x103/uL    MONOCYTE # 0.40 0.20 - 0.90 x103/uL    EOSINOPHIL # 0.00 0.00 - 0.50 x103/uL    BASOPHIL # 0.00 0.00 - 0.10 x103/uL   MAGNESIUM   Result Value Ref Range    MAGNESIUM 2.1 1.4 - 2.1 mg/dL   PHOSPHORUS   Result Value Ref Range    PHOSPHORUS 4.4 2.7 - 4.5 mg/dL   HGA1C (HEMOGLOBIN A1C WITH EST AVG GLUCOSE)   Result Value Ref Range    ESTIMATED AVERAGE GLUCOSE 269 (H) 70 - 110 mg/dL    GLYCOHEMOGLOBIN (HBA1C) 11.0 (H) 4.0 - 6.0 %   POC FINGERSTICK GLUCOSE - [facility] (RESULTS)   Result Value Ref Range    GLUCOSE, POC 297 (H) 60 - 100 mg/dL   TROPONIN-I   Result Value Ref Range    TROPONIN I <0.03 <=0.06 ng/mL   POC FINGERSTICK GLUCOSE - [facility] (RESULTS)   Result Value Ref  Range    GLUCOSE, POC 358 (H) 60 - 100 mg/dL       Imaging Studies:  N/A    Chart Reviewed: yes   Patient Active Problem List   Diagnosis    Dyspnea    COPD exacerbation (HCC)    Obesity (BMI 35.0-39.9 without comorbidity)    Tobacco abuse    Chronic respiratory failure with hypoxia (HCC)    CAP (community acquired pneumonia)    Morbid obesity    COPD with exacerbation ([place])       Assessment:  AECOPD  Acute type I and II respiratory failure   Smoker   Obesity   SIRS   Hyperglycemia   COPD    Recommendations:  Taper steroids   BD's   BiPAP as needed   Wean oxygen to keep sat around 90   LABA/ICS  Antibiotics   Glycemic control   Increase activity

## 2015-11-12 NOTE — Nurses Notes (Signed)
Notified Dr. Corwin Levins of blood glucose 358.

## 2015-11-12 NOTE — Nurses Notes (Signed)
11/12/15 [id number]   SBAR Handoff Report   Handoff report given to: Freda Munro RN   Time SBAR given: [phone]

## 2015-11-12 NOTE — Nurses Notes (Signed)
Report from [place], [place] previous shift.

## 2015-11-13 DIAGNOSIS — E669 Obesity, unspecified: Secondary | ICD-10-CM

## 2015-11-13 LAB — BASIC METABOLIC PANEL
ANION GAP: 7 mmol/L (ref 3–11)
BUN/CREA RATIO: 25 — ABNORMAL HIGH (ref 6–22)
BUN: 14 mg/dL (ref 6–20)
CALCIUM: 9.6 mg/dL (ref 8.6–10.3)
CHLORIDE: 97 mmol/L — ABNORMAL LOW (ref 101–111)
CO2 TOTAL: 35 mmol/L — ABNORMAL HIGH (ref 22–32)
CREATININE: 0.57 mg/dL (ref 0.44–1.00)
ESTIMATED GFR: >60 mL/min/1.73m?2 (ref >60)
GLUCOSE: 291 mg/dL — ABNORMAL HIGH (ref 70–110)
POTASSIUM: 4.8 mmol/L (ref 3.4–5.1)
SODIUM: 139 mmol/L (ref 136–145)

## 2015-11-13 LAB — CBC WITH DIFF
BASOPHIL #: 0 x10?3/uL (ref 0.00–0.10)
BASOPHIL %: 0 % (ref 0–3)
EOSINOPHIL #: 0 x10?3/uL (ref 0.00–0.50)
EOSINOPHIL %: 0 % (ref 0–5)
HCT: 44.4 % (ref 36.0–45.0)
HGB: 14.8 g/dL (ref 12.0–15.5)
LYMPHOCYTE #: 1.7 x10?3/uL (ref 1.00–4.80)
LYMPHOCYTE %: 10 % — ABNORMAL LOW (ref 15–43)
MCH: 31.2 pg (ref 27.5–33.2)
MCHC: 33.4 g/dL (ref 32.0–36.0)
MCV: 93.4 fL (ref 82.0–97.0)
MONOCYTE #: 1.2 x10ˆ3/uL — ABNORMAL HIGH (ref 0.20–0.90)
MONOCYTE %: 7 % (ref 5–12)
MPV: 8.7 fL (ref 7.4–10.5)
NEUTROPHIL #: 13.4 x10ˆ3/uL — ABNORMAL HIGH (ref 1.50–6.50)
NEUTROPHIL %: 82 % — ABNORMAL HIGH (ref 43–76)
PLATELETS: 273 x10?3/uL (ref 150–450)
RBC: 4.76 x10ˆ6/uL (ref 4.00–5.10)
RDW: 14 % (ref 11.0–16.0)
WBC: 16.3 x10ˆ3/uL — ABNORMAL HIGH (ref 4.0–11.0)

## 2015-11-13 LAB — POC FINGERSTICK GLUCOSE - BMC/JMC (RESULTS)
GLUCOSE, POC: 284 mg/dL — ABNORMAL HIGH (ref 60–100)
GLUCOSE, POC: 258 mg/dL — ABNORMAL HIGH (ref 60–100)
GLUCOSE, POC: 247 mg/dL — ABNORMAL HIGH (ref 60–100)

## 2015-11-13 MED ORDER — INSULIN GLARGINE (U-100) 100 UNIT/ML SUBCUTANEOUS SOLUTION
20.0000 [IU] | Freq: Every day | SUBCUTANEOUS | Status: DC
Start: 2015-11-13 — End: 2015-11-14
  Administered 2015-11-13 – 2015-11-14 (×2): 20 [IU] via SUBCUTANEOUS

## 2015-11-13 MED ORDER — PREDNISONE 20 MG TABLET
20.00 mg | ORAL_TABLET | Freq: Every morning | ORAL | Status: DC
Start: 2015-11-14 — End: 2015-11-14
  Administered 2015-11-14: 20 mg via ORAL
  Filled 2015-11-13: qty 1

## 2015-11-13 MED ADMIN — nystatin 100,000 unit/gram topical powder: RESPIRATORY_TRACT | @ 03:00:00 | NDC 00574200815

## 2015-11-13 MED ADMIN — lactated Ringers intravenous solution: TRANSDERMAL | @ 09:00:00 | NDC 00338011704

## 2015-11-13 MED ADMIN — lactated Ringers intravenous solution: ORAL | @ 18:00:00 | NDC 00264775000

## 2015-11-13 NOTE — Progress Notes (Signed)
[facility]  [place], [place] [address]    IP PROGRESS NOTE      [name]  Date of Admission:  11/11/2015  Date of Birth:  1955-07-12  Date of Service:  11/13/2015    Chief Complaint:  Says she is feeling much better, would like to go home as her husband's  birthday is tomorrow  Subjective:     Vital Signs:  Temp (24hrs) Max:36.7 C (98 F)      Temperature: 36.6 C (97.9 F)  BP (Non-Invasive): (!) 160/92  MAP (Non-Invasive): 99 mmHG  Heart Rate: (!) 111  Respiratory Rate: 20  Pain Score (Numeric, Faces): 0  SpO2-1: 94 %    Current Medications:    Current Facility-Administered Medications:  acetaminophen (TYLENOL) tablet 650 mg Oral Q6H PRN   azithromycin (ZITHROMAX) tablet 500 mg Oral Daily   budesonide (PULMICORT RESPULES) 0.5 mg/2 mL nebulizer suspension 0.5 mg Nebulization 2x/day   carvedilol (COREG) tablet 6.25 mg Oral 2x/day-Food   cefTRIAXone (ROCEPHIN) 1 g in iso-osmotic 50 mL premix IVPB 1 g Intravenous Q24H   SSIP insulin R human 100 units/mL injection 2-12 Units Subcutaneous 4x/day AC   And      dextrose 50% (0.5 g/mL) injection - syringe 12.5 g Intravenous Q15 Min PRN   enoxaparin (LOVENOX) 40 mg/0.4 mL SubQ injection 40 mg Subcutaneous Daily   formoterol (PERFOROMIST) 20 mcg/2 mL nebulizer solution 20 mcg Nebulization Q12H   hydrALAZINE (APRESOLINE) injection 10 mg 10 mg Intravenous Q6H PRN   insulin glargine (LANTUS) 100 units/mL injection 20 Units Subcutaneous Daily   ipratropium (ATROVENT) 0.02% nebulizer solution 0.5 mg Nebulization Q4H WA   levalbuterol (XOPENEX) 1.25 mg/ 3 mL nebulizer solution 1.25 mg Nebulization Q4H WA   linagliptin (TRADJENTA) tablet 5 mg Oral Daily   lisinopril (PRINIVIL) tablet 40 mg Oral Daily   nicotine (NICODERM CQ) transdermal patch (mg/24 hr) 21 mg Transdermal Daily   nitroGLYCERIN (NITROSTAT) sublingual tablet 0.4 mg Sublingual Q5 Min PRN   NS flush syringe 10 mL Intravenous Q8HRS   NS flush syringe 10 mL Intravenous Q1H PRN      pantoprazole (PROTONIX) delayed release tablet 40 mg Oral Daily   [START ON 11/14/2015] predniSONE (DELTASONE) tablet 20 mg Oral Daily with Breakfast       Today's Physical Exam:  General:  Appears chronically ill, older than stated age, acutely ill, short of breath on conversing  Eyes: Pupils equal and round, reactive to light and accomodation.   HEENT: Head atraumatic and normocephalic   Neck: No JVD or thyromegaly or lymphadenopathy   Lungs:  Decreased air entry, wheezing, no Crepts   Cardiovascular: regular rate and rhythm tachycardia, S1, S2 normal, no murmur  Abdomen: Soft, non-tender, Bowel sounds normal, No hepatosplenomegaly   Extremities: extremities normal, atraumatic, no cyanosis or edema   Skin: Skin warm and dry   Neurologic: Grossly normal , nonfocal  Lymphatics: No lymphadenopathy   Psychiatric: Normal affect, behavior,     I/O:  I/O last 24 hours:      Intake/Output Summary (Last 24 hours) at 11/13/15 1745  Last data filed at 11/13/15 1313   Gross per 24 hour   Intake             1560 ml   Output                0 ml   Net             1560 ml  I/O current shift:         Labs  Please indicate ordered or reviewed)  Reviewed: I have reviewed all lab results.        Radiology Tests (Please indicate ordered or reviewed)  Reviewed: CXR:  Report noted  IMPRESSION:  IMPRESSION:  No radiographic evidence for acute cardiopulmonary disease.  COPD    Problem List:  Active Hospital Problems   (*Primary Problem)    Diagnosis    COPD with exacerbation (HCC)       Assessment/ Plan:     1.  acute exacerbation of COPD:     off of BiPAP now on high-flow nasal cannula 5 L     Start on IV Solu-Medrol Duo nebs incentive spirometry     CW  Rocephin and doxycycline     Chest x-ray noted     Pulmonary consultation appreciated    2.  Acute on chronic hypercapnic and hypoxemic respiratory failure:       Secondary to acute exacerbation of COPD:       Treat as #1        patient already has O2 at home        Discussed with  case manager Arbie Cookey    3. Hypertension:      Uncontrolled,      Continue with coreg , increased dose of lisinopril,       P.r.n. Hydralazine    4. History of diabetes mellitus type 2:      Blood sugars running high secondary to steroids       Continue linagliptin, increase to high dose sliding scale insulin        Lantus increased to 20 units    5. History of hyperlipidemia:       Continue with atorvastatin     Check troponin, so far negative      DVT and GI prophylaxis. Lovenox and protonix.  Anticipate discharge a.m. try to wean oxygen to keep saturations around 90-92%

## 2015-11-13 NOTE — Care Management Notes (Signed)
I visited with the patient on 11/12/15 for discharge planning.  She denies needs at this time and felt she could return home without in-home skilled services.  The transferring CM contacted [facility] regarding refills of the patient's portable system.  I have placed [facility] contact information on the AVS along with information on more user-friendly portable systems versus tanks.  Will continue to follow for needs/consults.     11/13/15 0732   Assessment Detail   Assessment Type Admission   Date of Care Management Update 11/12/15   Readmission   Is this a readmission? Yes   Number of days between last admission and this admission? 6   Were your symptoms the same as before? Yes   Social Work Animal nutritionist Status initial meeting   Anticipated Discharge Disposition Home   Discharge Needs Assessment   Concerns To Be Addressed denies needs/concerns at this time   Equipment Currently Used at Home walker, rolling;oxygen   Equipment Needed After Discharge (Portable tank refills)   Referral Information   Admission Type inpatient

## 2015-11-13 NOTE — Pharmacy (Signed)
[facility]  [place], [place] [address]  Pharmacy Chart Review      SHAQUEL PEARDON  [birth year]  [id number]    For this patient, I have reviewed their medication profile and have provided some recommendations.    Please consider the following changes:    1. DM II: Patient's fingersticks ranged from 297 to 384 over the past 24 hours and required 29 units of sliding scale insulin. Lantus 10 units daily was initiated yesterday morning then increased to 15 units daily this morning. If glucose continues to stay elevated, consider another dose increase in Lantus.    2. Day 3 of azithromycin for COPD exacerbation. Consider stopping after today's dose.    Thank you!    Sabino Gasser, PHARMD  11/13/2015, 08:07

## 2015-11-13 NOTE — Care Plan (Signed)
Problem: Patient Care Overview (Adult,OB)  Goal: Plan of Care Review(Adult,OB)  The patient and/or their representative will communicate an understanding of their plan of care   Outcome: Ongoing (see interventions/notes)  I visited with the patient on 11/12/15 for discharge planning.  She denies needs at this time and felt she could return home without in-home skilled services.  The transferring CM contacted [facility] regarding refills of the patient's portable system.  I have placed [facility] contact information on the AVS along with information on more user-friendly portable systems versus tanks.  Will continue to follow for needs/consults.

## 2015-11-13 NOTE — Care Plan (Signed)
Problem: Patient Care Overview (Adult,OB)  Goal: Plan of Care Review(Adult,OB)  The patient and/or their representative will communicate an understanding of their plan of care   Outcome: Ongoing (see interventions/notes)    11/13/15 0106   Coping/Psychosocial   Plan Of Care Reviewed With patient         Problem: Chronic Obstructive Pulmonary Disease (Adult)  Prevent and manage potential problems including: 1. functional deficit 2. infection 3. respiratory compromise 4. situational response 5. undernutrition   Goal: Signs and Symptoms of Listed Potential Problems Will be Absent, Minimized or Managed (Chronic Obstructive Pulmonary Disease)  Signs and symptoms of listed potential problems will be absent, minimized or managed by discharge/transition of care (reference Chronic Obstructive Pulmonary Disease (Adult) CPG).   Outcome: Ongoing (see interventions/notes)    11/13/15 0106   Chronic Obstructive Pulmonary Disease   Problems Assessed (Chronic Obstructive Pulmonary Disease (COPD)) respiratory compromise   Problems Present (COPD, Bronch/[place]phy) respiratory compromise

## 2015-11-13 NOTE — Progress Notes (Addendum)
[facility]  [place], [place] [address]      Pulmonary Consult Follow     [name], [name], 60 y.o. female  Date of Service: 11/13/2015  Date of Birth:  01-19-1955      Chief Complaint:  SOB  Subjective: breathing better, wants to go home. Was able to ambulate  Review of Systems - all ROS reviewed and  other than what is mentioned in HPI,  All other ROS Negative    Past Medical History  No current outpatient prescriptions on file.     Allergies   Allergen Reactions    Codeine  Other Adverse Reaction (Add comment)     Sick to stomach    Hydrocodone Nausea/ Vomiting    Metformin Diarrhea    Oxycodone Nausea/ Vomiting     Past Medical History:   Diagnosis Date    COPD (chronic obstructive pulmonary disease) (HCC)     Degenerative joint disease involving multiple joints     Diabetes mellitus (HCC)     HTN (hypertension)     Smoker          Past Surgical History:   Procedure Laterality Date    HX CARPAL TUNNEL RELEASE           Family Medical History     Problem Relation (Age of Onset)    COPD Mother    Coronary Artery Disease Father    Diabetes Mother, Sister, Brother, Paternal Grandmother            Social History     Social History    Marital status: Married     Spouse name: N/A    Number of children: N/A    Years of education: N/A     Social History Main Topics    Smoking status: Current Every Day Smoker     Packs/day: 1.00     Years: 20.00     Types: Cigarettes    Smokeless tobacco: Never Used    Alcohol use No    Drug use: Not on file    Sexual activity: Not on file     Other Topics Concern    Not on file     Social History Narrative       Objective:  Temperature: 36.7 C (98 F)  Heart Rate: (!) 102  BP (Non-Invasive): (!) 158/92  Respiratory Rate: 20  SpO2-1: 98 %  Pain Score (Numeric, Faces): 0    Pulmonary Vitals: BP (!) 158/92   Pulse (!) 102   Temp 36.7 C (98 F)   Resp 20   Ht 1.6 m (5\' 3" )   Wt 88 kg (194 lb)   SpO2 98%   BMI 34.37 kg/m2  General: appears  chronically ill and moderately obese  HENT:ENT without erythema or injection, mucous membranes moist.  Lungs: decreased breath sounds throughout all lung fields  Cardiovascular:    Heart S1, S2 normal  Abdomen: soft, non-tender and bowel sounds normal  Extremities: no cyanosis or edema  Skin: Skin warm and dry  Neurologic: CN II - XII grossly intact   Lymphatics: no lymphadenopathy      Labs:    Lab Results for Last 24 Hours:    Results for orders placed or performed during the hospital encounter of 11/11/15 (from the past 24 hour(s))   POC FINGERSTICK GLUCOSE - [facility] (RESULTS)   Result Value Ref Range    GLUCOSE, POC 379 (H) 60 - 100 mg/dL   POC FINGERSTICK GLUCOSE -  [facility] (RESULTS)   Result Value Ref Range    GLUCOSE, POC 384 (H) 60 - 100 mg/dL   BASIC METABOLIC PANEL   Result Value Ref Range    SODIUM 139 136 - 145 mmol/L    POTASSIUM 4.8 3.4 - 5.1 mmol/L    CHLORIDE 97 (L) 101 - 111 mmol/L    CO2 TOTAL 35 (H) 22 - 32 mmol/L    ANION GAP 7 3 - 11 mmol/L    CALCIUM 9.6 8.6 - 10.3 mg/dL    GLUCOSE 291 (H) 70 - 110 mg/dL    BUN 14 6 - 20 mg/dL    CREATININE 0.57 0.44 - 1.00 mg/dL    BUN/CREA RATIO 25 (H) 6 - 22    ESTIMATED GFR >60 >60 mL/min/1.62m2   CBC WITH DIFF   Result Value Ref Range    WBC 16.3 (H) 4.0 - 11.0 x103/uL    RBC 4.76 4.00 - 5.10 x106/uL    HGB 14.8 12.0 - 15.5 g/dL    HCT 44.4 36.0 - 45.0 %    MCV 93.4 82.0 - 97.0 fL    MCH 31.2 27.5 - 33.2 pg    MCHC 33.4 32.0 - 36.0 g/dL    RDW 14.0 11.0 - 16.0 %    PLATELETS 273 150 - 450 x103/uL    MPV 8.7 7.4 - 10.5 fL    NEUTROPHIL % 82 (H) 43 - 76 %    LYMPHOCYTE % 10 (L) 15 - 43 %    MONOCYTE % 7 5 - 12 %    EOSINOPHIL % 0 0 - 5 %    BASOPHIL % 0 0 - 3 %    NEUTROPHIL # 13.40 (H) 1.50 - 6.50 x103/uL    LYMPHOCYTE # 1.70 1.00 - 4.80 x103/uL    MONOCYTE # 1.20 (H) 0.20 - 0.90 x103/uL    EOSINOPHIL # 0.00 0.00 - 0.50 x103/uL    BASOPHIL # 0.00 0.00 - 0.10 x103/uL   POC FINGERSTICK GLUCOSE - [facility] (RESULTS)   Result Value Ref Range    GLUCOSE,  POC 323 (H) 60 - 100 mg/dL   POC FINGERSTICK GLUCOSE - [facility] (RESULTS)   Result Value Ref Range    GLUCOSE, POC 284 (H) 60 - 100 mg/dL       Imaging Studies:  N/A    Chart Reviewed: yes   Patient Active Problem List   Diagnosis    Dyspnea    COPD exacerbation (HCC)    Obesity (BMI 35.0-39.9 without comorbidity)    Tobacco abuse    Chronic respiratory failure with hypoxia (HCC)    CAP (community acquired pneumonia)    Morbid obesity    COPD with exacerbation (HCC)       Assessment:  AECOPD  Smoker   Obesity   SIRS   Hyperglycemia   COPD    Recommendations:  Taper steroids for a total of 14 days  BD's   BiPAP as needed   Wean oxygen to keep sat around 90   LABA/ICS  Antibiotics   Need PFT and PSg as outpatient   Spiriva and Breo once discharged   Can follow up with me in 4 weeks

## 2015-11-13 NOTE — Nurses Notes (Signed)
Assumed care of pt. AOx4, denies pain or needs,  5LHi Flo NC,  Call bell in reach, tele in place, will monitor.

## 2015-11-13 NOTE — Care Plan (Signed)
Problem: Patient Care Overview (Adult,OB)  Goal: Plan of Care Review(Adult,OB)  The patient and/or their representative will communicate an understanding of their plan of care   Outcome: Ongoing (see interventions/notes)    11/13/15 2254   Coping/Psychosocial   Plan Of Care Reviewed With patient         Problem: Chronic Obstructive Pulmonary Disease (Adult)  Prevent and manage potential problems including: 1. functional deficit 2. infection 3. respiratory compromise 4. situational response 5. undernutrition   Goal: Signs and Symptoms of Listed Potential Problems Will be Absent, Minimized or Managed (Chronic Obstructive Pulmonary Disease)  Signs and symptoms of listed potential problems will be absent, minimized or managed by discharge/transition of care (reference Chronic Obstructive Pulmonary Disease (Adult) CPG).   Outcome: Ongoing (see interventions/notes)    11/13/15 0106   Chronic Obstructive Pulmonary Disease   Problems Assessed (Chronic Obstructive Pulmonary Disease (COPD)) respiratory compromise   Problems Present (COPD, Bronch/Emphy) respiratory compromise         Problem: Fall Risk (Adult)  Goal: Identify Related Risk Factors and Signs and Symptoms  Related risk factors and signs and symptoms are identified upon initiation of Human Response Clinical Practice Guideline (CPG).   Outcome: Ongoing (see interventions/notes)    11/13/15 2254   Fall Risk   Related Risk Factors (Fall Risk) environment unfamiliar   Signs and Symptoms (Fall Risk) presence of risk factors

## 2015-11-14 LAB — POC FINGERSTICK GLUCOSE - BMC/JMC (RESULTS)
GLUCOSE, POC: 118 mg/dL — ABNORMAL HIGH (ref 60–100)
GLUCOSE, POC: 239 mg/dL — ABNORMAL HIGH (ref 60–100)

## 2015-11-14 MED ORDER — INSULIN GLARGINE (U-100) 100 UNIT/ML SUBCUTANEOUS SOLUTION
20.0000 [IU] | Freq: Every day | SUBCUTANEOUS | 0 refills | Status: DC
Start: 2015-11-14 — End: 2015-11-18

## 2015-11-14 MED ORDER — CARVEDILOL 6.25 MG TABLET
6.25 mg | ORAL_TABLET | Freq: Two times a day (BID) | ORAL | 0 refills | Status: DC
Start: 2015-11-14 — End: 2016-01-15

## 2015-11-14 MED ORDER — LISINOPRIL 20 MG TABLET
40.0000 mg | ORAL_TABLET | Freq: Every day | ORAL | Status: DC
Start: 2015-11-14 — End: 2015-12-02

## 2015-11-14 MED ORDER — PREDNISONE 10 MG TABLET
ORAL_TABLET | ORAL | 0 refills | Status: DC
Start: 2015-11-14 — End: 2015-11-18

## 2015-11-14 MED ORDER — AMOXICILLIN 875 MG-POTASSIUM CLAVULANATE 125 MG TABLET
1.00 | ORAL_TABLET | Freq: Two times a day (BID) | ORAL | 0 refills | Status: AC
Start: 2015-11-14 — End: 2015-11-16

## 2015-11-14 MED ORDER — ASPIRIN 81 MG CHEWABLE TABLET
81.0000 mg | CHEWABLE_TABLET | Freq: Every day | ORAL | Status: DC
Start: 2015-11-14 — End: 2019-07-11

## 2015-11-14 MED ORDER — BLOOD SUGAR DIAGNOSTIC STRIPS
ORAL_STRIP | 4 refills | Status: AC
Start: 2015-11-14 — End: 2015-11-15

## 2015-11-14 MED ADMIN — SODIUM CHLORIDE 0.9 % W/ ADDITIVES: ORAL | @ 09:00:00

## 2015-11-14 MED ADMIN — sodium chloride 0.9 % (flush) injection syringe: RESPIRATORY_TRACT | @ 11:00:00

## 2015-11-14 NOTE — Nurses Notes (Signed)
Patient discharged home with family.  AVS reviewed with patient/care giver.  A written copy of the AVS and discharge instructions was given to the patient/care giver.  Questions sufficiently answered as needed.  Patient/care giver encouraged to follow up with PCP as indicated.  In the event of an emergency, patient/care giver instructed to call 911 or go to the nearest emergency room. Follow up appointments and discharge medications reviewed with pt and pt verbalized understanding. Pt aware she needs to call Dr. Annie Main office on Monday to make her hospital follow up appointment. Prescriptions handed to pt and all meds and last doses given reviewed with pt. Pt states she has been giving herself insulin for a long time (Lantus) and does not need to be shown how to do it. IV removed, catheter intact. Tele removed. Pt denies any trouble breathing. VSS. She denies any needs or complaints at this time. Pt taken off unit via wheel chair by nursing staff.

## 2015-11-14 NOTE — Discharge Summary (Addendum)
[facility]  DISCHARGE SUMMARY      PATIENT NAME:  [name], [name]  MRN:  [id number]  DOB:  1955/05/21    ADMISSION DATE:  11/11/2015  DISCHARGE DATE:  11/14/2015    ATTENDING PHYSICIAN: Justice Rocher, MD  PRIMARY CARE PHYSICIAN: Emelda Brothers, MD     ADMISSION DIAGNOSIS: <principal problem not specified>  Chief Complaint   Patient presents with    Shortness of Breath       DISCHARGE DIAGNOSIS:     Principal Problem:      Acute exacerbation of COPD    Active Hospital Problems    Diagnosis Date Noted    COPD with exacerbation ([place]) 11/11/2015      Resolved Hospital Problems    Diagnosis    No resolved problems to display.     Active Non-Hospital Problems    Diagnosis Date Noted    Morbid obesity 10/31/2014    CAP (community acquired pneumonia) 10/30/2014    Tobacco abuse 10/11/2012    Chronic respiratory failure with hypoxia ([place]) 10/11/2012    Dyspnea 10/08/2012    COPD exacerbation ([place]) 10/08/2012    Obesity (BMI 35.0-39.9 without comorbidity) 10/08/2012      Allergies   Allergen Reactions    Codeine  Other Adverse Reaction (Add comment)     Sick to stomach    Hydrocodone Nausea/ Vomiting    Metformin Diarrhea    Oxycodone Nausea/ Vomiting        DISCHARGE MEDICATIONS:     Current Discharge Medication List      START taking these medications.       Details    amoxicillin-pot clavulanate 875-125 mg Tablet   Commonly known as:  AUGMENTIN    1 Tab, Oral, Q12H   Qty:  4 Tab   Refills:  0       aspirin 81 mg Tablet, Chewable    81 mg, Oral, Daily   Refills:  0       Blood Sugar Diagnostic Strip    Fingersticks 4 times daily and as needed   Qty:  400 Strip   Refills:  4       carvedilol 6.25 mg Tablet   Commonly known as:  COREG    6.25 mg, Oral, 2x/day-Food   Qty:  30 Tab   Refills:  0       insulin glargine 100 unit/mL injection (vial)   Commonly known as:  LANTUS    20 Units, Subcutaneous, Daily   Qty:  2 Vial   Refills:  0       predniSONE 10 mg Tablet   Commonly known as:  DELTASONE    Take 20  mg po daikly for 2 days , then 10 mg po daily for 2 days then stop   Qty:  15 Tab   Refills:  0         CONTINUE these medications which have CHANGED during your visit.       Details    lisinopril 20 mg Tablet   Commonly known as:  PRINIVIL   What changed:  how much to take    40 mg, Oral, Daily   Refills:  0         CONTINUE these medications - NO CHANGES were made during your visit.       Details    albuterol 2.5 mg/0.5 mL Solution for Nebulization   Commonly known as:  PROVENTIL    2.5 mg, Nebulization, 4x/day  Qty:  50 Each   Refills:  0       COMBIVENT RESPIMAT 20-100 mcg/actuation Aerosol   Generic drug:  ipratropium-albuterol    1 Puff, 4x/day   Refills:  0       fluticasone-salmeterol 500-50 mcg/dose Disk with Device oral diskus inhaler   Commonly known as:  ADVAIR    1 INHALATION, Inhalation, 2x/day   Qty:  1 Inhaler   Refills:  0       JANUVIA 100 mg Tablet   Generic drug:  sitaGLIPtin    100 mg, Daily   Refills:  0       tiotropium bromide 18 mcg Capsule, w/Inhalation Device   Commonly known as:  SPIRIVA WITH HANDIHALER    18 mcg, Inhalation, Daily   Qty:  30 Cap   Refills:  0       XOPENEX 0.31 mg/3 mL Solution for Nebulization   Generic drug:  levalbuterol HCl    0.31 mg, Q4H PRN   Refills:  0         STOP taking these medications.          glimepiride 2 mg Tablet   Commonly known as:  AMARYL             DISCHARGE INSTRUCTIONS:     DISCHARGE INSTRUCTION - DIET   Diet: RESUME HOME DIET    Diet: DIABETIC DIET      DISCHARGE INSTRUCTION - ACTIVITY   Activity: AS TOLERATED      ASPIRIN NOT ORDERED AT THIS TIME     FULL PULMONARY FUNCTION STUDY (INCLUDES FVC,SVC,DLCO,FRC,PRE/POST BD)   Please instruct patient on whether or not to hold bronchodilators.   Established pulmonary patients do not need to withhold bronchodilators.  Please instruct newly diagnosed or r/o patients to withhold bronchodilator on day of testing.      Reason for Exam COPD    Should patient hold bronchodilator day of testing? YES           POLYSOMNOGRAPHY - SLEEP STUDY - UNATTENDED   Does the patient snore? Yes    Does the patient have excessive daytime sleepiness? Yes    Does the patient have observed apneas? No    Ht 160 cm    Wt 88 kg      DME - GLUCOMETER   Ht 160 cm    Wt 88.2 kg    Current Attending: dr.Natane Heward    Medical Condition or Diagnosis which is primary reason for equipment: dm2    Start Date 11/14/2015      DME - DIABETIC SUPPLIES   Ht 160 cm    Wt 88.2 kg    Current Attending: Rice Walsh    Medical Condition or Diagnosis which is primary reason for equipment: dm2    Start Date 11/14/2015        Follow-up Information     Follow up with Janus, Anderson Malta, MD In 1 week.    Specialty:  EXTERNAL    Why:  Hospital Follow Up: Please call PCP office when they open on Monday to make your 1 week hospital follow up appointment.    Contact information:    [address]  [id number]  [place] [address]  [phone]          Follow up with Marlis Edelson, MD In 1 week.    Specialties:  PULMONARY DISEASE, CRITICAL CARE    Why:  f/u copd; Please call Dr. Annie Main office on Monday when  they open to make your 1 week hospital follow up appointment.    Contact information:    [address]  [address]  [place] [address]  [phone]  To acute exacerbation of COPD          REASON FOR HOSPITALIZATION AND HOSPITAL COURSE:  This is a 60 y.o., female   Who presented to the emergency department with shortness of breath acute hypoxemic and hypercapnic respiratory failure secondary to acute exacerbation of COPD.  Patient was initially requiring BiPAP and ICU stay.  Patient was started on IV Solu-Medrol and DuoNeb with which she improved.  She was also started on IV antibiotics.  Pulmonary consultation was requested.  Patient made slow progress and she was transitioned to [facility].  She initially remained on high-flow nasal cannula which was then weaned off to 2 L via nasal cannula.  Patient ambulated independently she  did experience some shortness of breath but she also has severe COPD.  Patient is being discharged on tapering dose of steroids.  Patient does have home O2.  And also neb machine at home as per her.    SIGNIFICANT PHYSICAL FINDINGS:   General: Appears chronically ill, older than stated age, acutely ill, stable at rest  Eyes: Pupils equal and round, reactive to light and accomodation.   HEENT: Head atraumatic and normocephalic  Neck: No JVD or thyromegaly or lymphadenopathy  Lungs: Improved air entry no wheezing,   Cardiovascular: regular rate and rhythmtachycardia, S1, S2 normal, no murmur  Abdomen: Soft, non-tender, Bowel sounds normal, No hepatosplenomegaly  Extremities: extremities normal, atraumatic, no cyanosis or edema  Skin: Skin warm and dry  Neurologic: Grossly normal, nonfocal  Lymphatics: No lymphadenopathy  Psychiatric: Normal affect, behavior,   SIGNIFICANT LAB:   SIGNIFICANT RADIOLOGY:     IMPRESSION:  No radiographic evidence for acute cardiopulmonary disease.  COPD                   CONSULTATIONS:  Pulmonary  PROCEDURES PERFORMED:  None        COURSE IN HOSPITAL:   1. acute exacerbation of COPD:   o patient is now off of BiPAP on 2 L via nasal cannula saturating decent.  She has been discharged on p.o. prednisone tapered patient already has O2 set up at home.  She also has a nebulizer at home.      She was also discharged on p.o. Augmentin    2. Acute on chronic hypercapnic and hypoxemic respiratory failure:  Secondary to acute exacerbation of COPD:  Patient is on O2 at home via nasal cannula.    3. Hypertension:  Better controlled on Coreg lisinopril.    4. History of diabetes mellitus type 2:  Her A1c is elevated and as such patient is being discharged home on the Lantus.      Glucometer lancets glucometer strips ordered      Patient received teaching in insulin administration    5. History of hyperlipidemia:  Continue with atorvastatin     Total  discharge management greater than 30 minutes    Patient ambulating without difficulty    DOES PATIENT HAVE ADVANCED DIRECTIVES:  No, Information Offered and Refused    ADVANCED CARE PLANNING -     CONDITION ON DISCHARGE: Alert, Oriented and VS Stable    DISCHARGE DISPOSITION:  Home discharge  and Home Oxygen    Copies sent to Care Team       Relationship Specialty Notifications  Start End    Emelda Brothers, MD PCP - General EXTERNAL  10/08/12     Phone: [phone] Fax: [phone]         [id number] [place] [address]

## 2015-11-16 LAB — ADULT ROUTINE BLOOD CULTURE, SET OF 2 BOTTLES (BACTERIA AND YEAST)
BLOOD CULTURE, ROUTINE: NO GROWTH
BLOOD CULTURE, ROUTINE: NO GROWTH

## 2015-11-18 ENCOUNTER — Ambulatory Visit (INDEPENDENT_AMBULATORY_CARE_PROVIDER_SITE_OTHER): Payer: BC Managed Care – PPO | Admitting: Family Medicine

## 2015-11-18 ENCOUNTER — Encounter (INDEPENDENT_AMBULATORY_CARE_PROVIDER_SITE_OTHER): Payer: Self-pay | Admitting: Family Medicine

## 2015-11-18 VITALS — BP 192/96 | HR 92 | Temp 98.1°F | Resp 24 | Ht 63.0 in | Wt 191.4 lb

## 2015-11-18 DIAGNOSIS — Z1231 Encounter for screening mammogram for malignant neoplasm of breast: Secondary | ICD-10-CM

## 2015-11-18 DIAGNOSIS — E119 Type 2 diabetes mellitus without complications: Secondary | ICD-10-CM | POA: Insufficient documentation

## 2015-11-18 DIAGNOSIS — J449 Chronic obstructive pulmonary disease, unspecified: Secondary | ICD-10-CM

## 2015-11-18 DIAGNOSIS — Z6833 Body mass index (BMI) 33.0-33.9, adult: Secondary | ICD-10-CM

## 2015-11-18 MED ORDER — GLIMEPIRIDE 4 MG TABLET
4.00 mg | ORAL_TABLET | Freq: Every morning | ORAL | 1 refills | Status: DC
Start: 2015-11-18 — End: 2016-08-24

## 2015-11-18 MED ORDER — INSULIN GLARGINE (U-100) 100 UNIT/ML (3 ML) SUBCUTANEOUS PEN
20.00 [IU] | PEN_INJECTOR | Freq: Every evening | SUBCUTANEOUS | 1 refills | Status: DC
Start: 2015-11-18 — End: 2016-01-15

## 2015-11-18 NOTE — Nursing Note (Signed)
Chief Complaint:   Chief Complaint     Shortness of Breath     Cold Symptoms             Functional Health Screen  Functional Health Screening:     Patient is under 18:  No   Have you had a recent unexplained weight loss or gain?:  No   Because we are aware of abuse and domestic violence today, we ask all patients: Are you being hurt, hit, or frightened by anyone at your home or in your life?:  No   Do you have any basic needs within your home that are not being met? (such as Food, Shelter, Games developer, Transportation):  No   Patient is under 18 and therefore has no Advance Directives:  No   Patient has:  No Advance   Patient has Advance Directive:  No   Patient offered:  Refused Packet   Screening unable to be completed:  No        BP (!) 192/96  Pulse 92  Temp 36.7 C (98.1 F) (Oral)   Ht 1.6 m (_0 )  Wt 86.8 kg (191 lb 6.4 oz)  BMI 33.9 kg/m2  History   Smoking Status   . Current Every Day Smoker   . Packs/day: 0.25   . Years: 20.00   . Types: Cigarettes   Smokeless Tobacco   . Never Used     Patient Health Rating           Depression Screening  PHQ Questionnaire  Little interest or pleasure in doing things.: Nearly every day  Feeling down, depressed, or hopeless: Nearly every day  PHQ 2 Total: 6  Trouble falling or staying asleep, or sleeping too much.: Not at all  Feeling tired or having little energy: More than half the days  Poor appetite or overeating: Not at all  Feeling bad about yourself/ that you are a failure in the past 2 weeks?: Nearly every day  Trouble concentrating on things in the past 2 weeks?: Not at all  Moving/Speaking slowly or being fidgety or restless  in the past 2 weeks?: Not at all  Thoughts that you would be better off DEAD, or of hurting yourself in some way.: Not at all  PHQ 9 Total: 11  Interpretation of Total Score: 10-14 Moderate depression  Allergies:  Allergies   Allergen Reactions   . Codeine  Other Adverse Reaction (Add comment)     Sick to stomach   . Hydrocodone Nausea/  Vomiting   . Metformin Diarrhea   . Oxycodone Nausea/ Vomiting     Medication History  Reviewed for OTC medication and any new medications, provider will review medication history  Results through Enter/Edit  No results found for this or any previous visit (from the past 24 hour(s)).  POCT Results  Care Team  Patient Care Team:  Hubbard Robinson, DO as PCP - General ([place])    Maye Hides, LPN  72/08/2058, 15:61

## 2015-11-18 NOTE — Patient Instructions (Addendum)
Patient Instructions    Smoking Cessation   This document explains the best ways for you to quit as well as new treatments to help. It also tells about ways to avoid relapses and talks about concerns you may have about quitting, including weight gain.   NICOTINE: A POWERFUL ADDICTION   If you have tried to quit smoking, you know how hard it can be. It is hard because nicotine is a very addictive drug. For some people, it can be as addictive as heroin or cocaine. Quitting is hard. Usually people make 2 or 3 tries, or more, before finally being able to quit. Each time you try to quit, you can learn about what helps and what hurts. Quitting takes hard work and a lot of effort, but you can quit smoking.   QUITTING SMOKING IS ONE OF THE MOST IMPORTANT THINGS YOU WILL EVER DO:   You will live longer and live better.   Quitting will lower your chance of having a heart attack, stroke, or cancer.   If you are pregnant, quitting smoking will improve your chances of having a healthy baby.   The people you live with, especially your children, will be healthier.   You will have extra money to spend on things other than cigarettes.   FIVE KEYS FOR QUITTING   Studies have shown that these five steps will help you quit and quit for good. You have the best chances of quitting if you use them together:   1 .Get ready.   2 .Get support.   3 .Learn new skills and behaviors.   4 .Get medication and use it correctly.   5 .Be prepared for relapse or difficult situations.   1. GET READY   Set a quit date.   Change your environment.   Get rid of ALL cigarettes and ashtrays in your home, car, and place of work.   Don't let people smoke in your home.   Review your past attempts to quit. Think about what worked and what did not.   Once you quit, don't smoke, NOT EVEN A PUFF!   2. GET SUPPORT AND ENCOURAGEMENT   Studies have shown that you have a better chance of being successful if you have help. You can get support in many ways:   Tell your  family, friends, and coworkers that you are going to quit and want their support. Ask them not to smoke around you or leave cigarettes out.   Talk to your health care provider (for example, doctor, dentist, nurse, pharmacist, psychologist, or smoking counselor).   Get individual, group, or telephone counseling. The more counseling you have, the better your chances are of quitting. Programs are given at local hospitals and health centers. Call your local health department for information about programs in your area.   3. LEARN NEW SKILLS AND BEHAVIORS   Try to distract yourself from urges to smoke. Talk to someone, go for a walk, or get busy with a task.   When you first try to quit, change your routine. Use a different route to work. Drink tea instead of coffee. Eat breakfast in a different place.   Do something to reduce your stress. Take a hot bath, exercise, or read a book.   Plan something enjoyable to do every day.   Drink a lot of water and other fluids.   4. GET MEDICATION AND USE IT CORRECTLY   There are medications available that can help you quit smoking. Ask your   physician what may be suitable for you.   5. BE PREPARED FOR RELAPSE OR DIFFICULT SITUATIONS   Most relapses occur within the first 3 months after quitting. Don't be discouraged if you start smoking again. Remember, most people try several times before they finally quit. Here are some difficult situations to watch for:   Alcohol. Avoid drinking alcohol. Drinking lowers your chances of success.   Other smokers. Being around smoking can make you want to smoke.   Weight gain. Many smokers will gain weight when they quit, usually less than 10 pounds. Eat a healthy diet and stay active. Don't let weight gain distract you from your main goal, quitting smoking. Some quit-smoking medications may help delay weight gain.   Bad mood or depression. There are a lot of ways to improve your mood other than smoking.   If you are having problems with any of these  situations, talk to your doctor or other health care provider.   SPECIAL SITUATIONS OR CONDITIONS   Studies suggest that everyone can quit smoking. Your situation or condition can give you a special reason to quit.   Pregnant women/new mothers: By quitting, you protect your baby's health and your own.   Hospitalized patients: By quitting, you reduce health problems and help healing.   Heart attack patients: By quitting, you reduce your risk of a second heart attack.   Lung, head, and neck cancer patients: By quitting, you reduce your chance of a second cancer.   Parents of children and adolescents: By quitting, you protect your children and adolescents from illnesses caused by second-hand smoke.   QUESTIONS TO THINK ABOUT   Think about the following questions before you try to stop smoking. You may want to talk about your answers with your health care provider.   1. Why do you want to quit?   2. When you tried to quit in the past, what helped and what didn't?   3. What will be the most difficult situations for you after you quit? How will you plan to handle them?   4. Who can help you through the tough times? Your family? Friends? Health care provider?   5. What pleasures do you get from smoking? What ways can you still get pleasure if you quit?   Here are some questions to ask your health care provider.   1. How can you help me to be successful at quitting?   2. What medication do you think would be best for me and how should I take it?   3. What should I do if I need more help? wal like? How can I get information on withdrawal?   WORK AND A LOT OF EFFORT, BUT YOU CAN QUIT SMOKING.     Additional Resources   [place] Quit[facility]- [phone]   www.ynotquit.com      Green List www.realmealrevolution.com  This food list is specifically useful for anyone doing low carbohydrate for Diabetes Mellitus (DM) Type 2, Pre Diabetes, and Obesity reversal. Also a healthy foundation for those with strong genetic tendency for  Diabetes Green is an eat freely list - you choose anything you like without worrying about the carbohydrate content as all the foods will be between 0 to 5g/100g. It will be almost impossible to overdo your carbohydrate intake by sticking to this group of foods. Overeating protein is not recommended, so eat a moderate amount of animal protein at each meal. Include as much fat as you are comfortable with -bearing in   mind that "[place]" (Low Carb DM reversal) is high in fat. Caution: even though these are eat freely foods, only eat when hungry, stop when full and do not overeat. The size and thickness of your palm without fingers is a good measure for a serving of animal Protein.    Green foods (Good foods to eat. Eat as much as you want):    ANIMAL PROTEIN (unless these have  a rating, they are all 0g/100g)  . All eggs  . All meats, poultry and game  . All natural and cured meats (pancetta, parma ham, coppa etc)  . All natural and cured sausages (salami, chorizo etc)  . All offal (organ meat)  . All seafood (except swordfish and tilefish - high mercury content)  . Broths    DAIRY (full fat)  . Cottage cheese  . Cream  . Cream cheese  . Full-cream Greek yoghurt  . Full-cream milk  . Hard cheeses  . Soft cheeses    FATS  . Any rendered animal fat  . Avocado oil  . Butter (not margarine)  . Cheese - firm, natural, full-fat, aged cheeses (not processed)  . Coconut oil  . Duck fat  . Ghee (Indian butter)  . Lard  . Macadamia oil  . Mayonnaise, full fat only (not from seeds oils)  . Olive oil    FLAVOURINGS AND CONDIMENTS  All flavourings and condiments are okay, provided they do not contain sugars and preservatives  or vegetable (seed) oils.    NUTS AND SEEDS  . Almonds  . Flaxseeds (watch out for pre-ground flaxseeds, they go rancid quickly and become toxic)  . Macadamia nuts  . Pecan nuts  . Pine nuts  . Pumpkin seeds  . Sunflower seeds  . Walnuts    SWEETENERS  . Erythritol granules  . Stevia powder  . Xylitol  granules    VEGETABLES  . All green leafy vegetables (spinach,  cabbage, lettuces etc)  . Any other vegetables grown above  the ground (except butternut)  . Artichoke hearts  . Asparagus  . Aubergines  . Avocados  . Broccoli  . Brussel sprouts  . Cabbage  . Cauliflower  . Celery  . Courgettes  . Leeks  . Mushrooms  . Olives  . Onions  . Peppers  . Pumpkin  . Radishes  . Sauerkraut  . Spring onions  . Tomatoes      Orange List www.realmealrevolution.com  Orange is made up of ingredients containing between 6g and 25g of carbs per 100g (6% - 25%). Chart your carbohydrates without getting obsessive and still obtain an excellent outcome. If you are endeavoring to go into ketosis, this list will assist you to stay under a total of 50g carbs for the day. These are all net carbs and they are all 23 to 25g per indicated amount. Ingredients are all fresh unless otherwise indicated.    Orange foods (can eat occasionally in small amounts):    FRUITS  . Apples 1.5  . Bananas 1 small  . Blackberries 3.5 C  . Blueberries 1.5 C  . Cherries (sweet) 1 C  . Clementines 3  . Figs 3 small  . Gooseberries 1.5 C  . Grapes (green) under 1 C  . Guavas 2  . Kiwi fruits 3  . [place] 18  . Mangos, sliced, under 1 C  . Nectarines 2  . Oranges 2  . [place] 1  . Peaches 2  . Pears ([place]) 1  .   Pineapple, sliced, 1 C  . Plums 4  . Pomegranate   . Prickly pears 4  . Quinces 2  . Raspberries 2 C  . Strawberries 25  . Watermelon 2 C    NUTS  . Cashews, raw, 6 T  . Chestnuts, raw, 1 C    SWEETENERS  . Honey 1 t    VEGETABLES  . Butternut 1.5 C  . Carrots 5  . Sweet potato 0.5 C    KEY  C = cups per day  T = tablespoons per day  t = teaspoons per day  g = grams per day  For example: 1.5 apples are all the carbs you can have off the orange list for the day (if you want to go into ketosis and make sure you are under 50g total carbs for the day).      Red List www.realmealrevolution.com  Red will contain all the foods to avoid as they will be either  toxic (e.g. seed oils, soy) or high-carbohydrate foods (e.g.potatoes, rice). We strongly suggest you avoid all the items on this list, or, at best, eat them very occasionally and restrict the amount when you do. They will do nothing to help you in your attempt to reach your goal.    Red foods (DO NOT EAT EVER):    BAKED GOODS  . All flours from grains - wheat flour, cornflour, rye flour, barley flour, pea flour, rice flour etc  . All forms of bread  . All grains - wheat, oats, barley, rye, amaranth, quinoa, teff etc  . Beans (dried)  . "Breaded" or battered foods  . Brans  . Breakfast cereals, muesli, granola of any kind  . Buckwheat  . Cakes, biscuits, confectionary  . Corn products - popcorn, polenta, corn thins, maize  . Couscous  . Crackers, cracker breads  . Millet  . Pastas, noodles  . Rice  . Rice cakes  . Sorghum  . Spelt  . Thickening agents such as gravy powder, maize starch or stock cubes      BEVERAGES  . Beer, cider  . Fizzy drinks (sodas) of any description other than carbonated water  . Lite, zero, diet drinks of any Description    DAIRY (low fat) / DAIRY-RELATED  . Cheese spreads, commercial spreads  . Coffee creamers  . Commercial almond milk  . Condensed milk  . Fat-free anything  . Ice cream  . Puddings  . Reduced-fat cow's milk  . Rice milk  . Soy milk    FATS  . All seed oils (safflower, sunflower, canola, grapeseed, cottonseed, corn)  . Chocolate  . Commercial sauces, marinades and salad dressings  . Hydrogenated or partially hydrogenated oils including margarine, vegetable oils, vegetable fats    FRUITS AND VEGETABLES  . Fruit juice of any kind  . Vegetable juices (other than homemade with Green list vegetables)    GENERAL  . All fast food  . All processed food  . Any food with added sugar such as glucose, dextrose etc    MEAT  . All unfermented soya (vegetarian  "protein")  . Meats cured with excessive sugar  . Vienna sausages, luncheon meats    STARCHY VEGETABLES  . Beetroots  . Legumes  .  Parsnips  . Peanuts  . Peas  . Potatoes (regular)    SWEETENERS  . Agave anything  . Artificial sweeteners (aspartame, acesulfame K, saccharin, sucralose, splenda)  . Cordials  . Dried fruit  . Fructose  .   Honey (except for 1 t on orange list)  . Malt  . Sugar  . Sugared or commercially pickled foods with sugar  . Sweets  . Syrups of any kind

## 2015-11-18 NOTE — H&P (Signed)
[facility]  [address]  [id number]  [place] [address]  New Patient/Annual Wellness    ID: [name] is a 60 y.o. female   DOB: 07/07/1955  Date of Service: 11/18/2015     Chief Concern:    Chief Complaint   Patient presents with    Shortness of Breath    Cold Symptoms       History of present illness:   Patient comes in today for hospital discharge follow-up and to establish care.  Patient was recently admitted to the hospital for COPD exacerbation.  Current medical history includes COPD with oxygen dependence at 3 liters, uncontrolled diabetes, obesity, degenerative disc disease, and hypertension.  Patient reports that she is down to few cigarettes per today but she has smoked heavily for the past 45 years.  Patient wears 3 liters of oxygen at home but does not have a portable tank to take outside the house.  Patient reports feeling better since discharge. A1c was 11 the hospital patient reports drinks a lot of soda.  She is placed on Lantus but got the vial so has been taking it.  She denies any alcohol or illicit drug use.  She is going to set up an appointment with pulmonology.  She has no other concerns today.    Past Medical History:   Patient Active Problem List    Diagnosis Date Noted    Diabetes ([place]) 11/18/2015    Morbid obesity 10/31/2014    Tobacco abuse 10/11/2012    Chronic respiratory failure with hypoxia ([place]) 10/11/2012     PATIENT IS GOING TO GO HOME WITH 2 LITERS OF O2 BY NASAL CANNULA PER MINUTE.       Obesity (BMI 35.0-39.9 without comorbidity) 10/08/2012       Past Surgical History:   Past Surgical History:   Procedure Laterality Date    HX CARPAL TUNNEL RELEASE              Medications:   Current Outpatient Prescriptions   Medication Sig    albuterol (PROVENTIL) 2.5 mg/0.5 mL Inhalation Solution for Nebulization 2.5 mg by Nebulization route Four times a day    aspirin 81 mg Oral Tablet, Chewable Take 1 Tab (81 mg total) by mouth Once a day    ATROVENT HFA  17 mcg/actuation Inhalation HFA Aerosol Inhaler oral inhaler inhale 2 puffs four times a day    carvedilol (COREG) 6.25 mg Oral Tablet Take 1 Tab (6.25 mg total) by mouth Twice daily with food    fluticasone-salmeterol (ADVAIR) 500-50 mcg/dose Inhalation Disk with Device oral diskus inhaler Take 1 INHALATION by inhalation Twice daily    glimepiride (AMARYL) 4 mg Oral Tablet Take 1 Tab (4 mg total) by mouth Every morning    insulin glargine (LANTUS SOLOSTAR) 100 unit/mL Subcutaneous Insulin Pen 20 Units by Subcutaneous route Every night    levalbuterol HCl (XOPENEX) 0.31 mg/3 mL Inhalation Solution for Nebulization 0.31 mg by Nebulization route Every 4 hours as needed    lisinopril (PRINIVIL) 20 mg Oral Tablet Take 2 Tabs (40 mg total) by mouth Once a day (Patient taking differently: Take 20 mg by mouth Once a day )    sitaGLIPtin (JANUVIA) 100 mg Oral Tablet Take 100 mg by mouth Once a day        Allergies:   Allergies   Allergen Reactions    Codeine  Other Adverse Reaction (Add comment)     Sick to stomach  Hydrocodone Nausea/ Vomiting    Metformin Diarrhea    Oxycodone Nausea/ Vomiting        Family History:   Family Medical History     Problem Relation (Age of Onset)    COPD Mother    Coronary Artery Disease Father    Diabetes Mother, Sister, Brother, Paternal Grandmother               Social History:   Social History     Social History    Marital status: Married     Spouse name: N/A    Number of children: N/A    Years of education: N/A     Occupational History    Not on file.     Social History Main Topics    Smoking status: Current Every Day Smoker     Packs/day: 0.25     Years: 20.00     Types: Cigarettes    Smokeless tobacco: Never Used    Alcohol use No    Drug use: Not on file    Sexual activity: Not on file     Other Topics Concern    Not on file     Social History Narrative        Review of Systems:  Constitutional: no weight loss, fever, night sweats  Eyes: wears eye glasses  ENT:  negative for ear infections, sinus infections, tonsillitis, throat infections  Cardiovascular: negative for chest pain, dyspnea and palpitations  Respiratory:+ difficulty breathing with exertion  GI: negative for abdominal pain, nausea, vomiting and diarrhea  GU: negative for frequency and dysuria  Musculoskeletal: + back and joint pain  Neurological: negative for headaches, dizziness and memory problems  Emotional/Pscychiatric: negative for anxiety and depression  Skin: negative for - skin lesion changes  Endocrine: negative for palpitations, weight gain, weight loss, cold intolerance and heat intolerance  Hematologic/Lymphatic: there is no easy bleeding or bruising       Physical Exam:  BP (!) 192/96   Pulse 92   Temp 36.7 C (98.1 F) (Oral)    Resp (!) 24   Ht 1.6 m (5\' 3" )   Wt 86.8 kg (191 lb 6.4 oz)   SpO2 (!) 83%   BMI 33.9 kg/m2     General:  Chronically ill, pleasant  HEENT:    Head: Normocephalic. Lesion above right eye unchanged   Eyes: Normal in appearance   Ears: bilateral TM's and external ear canals normal   Nose: Nares normal. Septum midline. Mucosa normal. No drainage or sinus tenderness.   Oropharynx: MMM no lesions  Neck: supple, no adenopathy  Heart: PMI normal. No lifts, heaves, or thrills. RRR. No murmurs, clicks, gallops, or rubs  Lungs: Decreased breath sounds, bilateral end expiratory wheezes present.    Abdomen: normal bowel sounds, non-tender and no masses  GU: not performed  Musculoskeletal: no joint tenderness, deformity or swelling  Extremities: no edema  Skin: Normal color, texture and turgor without significant lesions or rashes  Neuro: Gait normal. Reflexes normal and symmetric. Sensation grossly normal  Mental Status: alert, oriented to person, place, and time         Assessment and Plan:   1. Diabetes ([place]) -  recent A1c was 11. Started patient on Lantus 20 units at nighttime.  She will continue with her glimepiride and Januvia.  Patient has a bad adverse side effect of diarrhea  to metformin.  Gave patient the low carbohydrate food list and discussed think she should and should not  be eating.  Patient reports she has diabetic supplies at home to check glucose.  Will have her follow-up in 1 month to see how she is doing.   2. Chronic obstructive pulmonary disease, unspecified COPD type ([place]) -  patient does not have a portable oxygen tank.  When patient arrived to the office her O2 sats were 83 percent.  She was placed on her home 3 liters of oxygen and O2 sats rebounded to 92 percent.  Patient was walked her car after the visit and she insisted that she would be okay to drive home with her husband without her oxygen.  Patient has severe COPD and make an appointment with pulmonology.  She reports that her nebulizer broke last week so new one was reordered for her.  Patient reports that she is down to only a few cigarettes per day but she has smoked for the last 45 years.   3. Encounter for screening mammogram for breast cancer -  mammogram ordered.        BMI addressed: Advised on diet, weight loss, and exercise to reduce their above normal BMI.  Tobacco cessation counseling performed.                Hubbard Robinson, DO  11/18/2015, 10:59    Portions of this note may be dictated using voice recognition software.  Variances in spelling and vocabulary are possible and unintentional. Not all errors are caught/corrected. Please notify the [facility] if any discrepancies are noted or if the meaning of any statement is not clear.

## 2015-12-02 ENCOUNTER — Other Ambulatory Visit (INDEPENDENT_AMBULATORY_CARE_PROVIDER_SITE_OTHER): Payer: Self-pay | Admitting: Family Medicine

## 2015-12-02 DIAGNOSIS — E119 Type 2 diabetes mellitus without complications: Secondary | ICD-10-CM

## 2015-12-02 MED ORDER — LISINOPRIL 40 MG TABLET
40.0000 mg | ORAL_TABLET | Freq: Every day | ORAL | 1 refills | Status: DC
Start: 2015-12-02 — End: 2016-08-24

## 2015-12-02 NOTE — Telephone Encounter (Signed)
Pt called requesting lisinopril refill. She states that you had wanted to increase the medication to 40 mg, but she has been only taking 20 mg for fear of running out. Please advise.  Landis Martins  12/02/2015, 15:17

## 2015-12-16 ENCOUNTER — Encounter (INDEPENDENT_AMBULATORY_CARE_PROVIDER_SITE_OTHER): Payer: BC Managed Care – PPO | Admitting: Family Medicine

## 2016-01-15 ENCOUNTER — Ambulatory Visit (INDEPENDENT_AMBULATORY_CARE_PROVIDER_SITE_OTHER): Payer: BC Managed Care – PPO | Admitting: Family Medicine

## 2016-01-15 VITALS — BP 188/104 | HR 108 | Temp 98.9°F | Resp 24 | Wt 202.6 lb

## 2016-01-15 DIAGNOSIS — E119 Type 2 diabetes mellitus without complications: Secondary | ICD-10-CM

## 2016-01-15 DIAGNOSIS — E1140 Type 2 diabetes mellitus with diabetic neuropathy, unspecified: Secondary | ICD-10-CM

## 2016-01-15 DIAGNOSIS — J449 Chronic obstructive pulmonary disease, unspecified: Secondary | ICD-10-CM

## 2016-01-15 DIAGNOSIS — Z6835 Body mass index (BMI) 35.0-35.9, adult: Secondary | ICD-10-CM

## 2016-01-15 DIAGNOSIS — Z720 Tobacco use: Secondary | ICD-10-CM

## 2016-01-15 DIAGNOSIS — I152 Hypertension secondary to endocrine disorders: Secondary | ICD-10-CM

## 2016-01-15 MED ORDER — GABAPENTIN 300 MG CAPSULE: 300 mg | Cap | Freq: Three times a day (TID) | ORAL | 0 refills | 0 days | Status: DC

## 2016-01-15 MED ORDER — FLUTICASONE-SALMETEROL 500 MCG-50 MCG/DOSE DISK DEVICE - EAST
1.0000 | DISK | Freq: Two times a day (BID) | RESPIRATORY_TRACT | 2 refills | Status: DC
Start: 2016-01-15 — End: 2016-03-10

## 2016-01-15 MED ORDER — AMLODIPINE 10 MG TABLET
10.0000 mg | ORAL_TABLET | Freq: Every day | ORAL | 4 refills | Status: DC
Start: 2016-01-15 — End: 2016-08-24

## 2016-01-15 MED ORDER — ATROVENT HFA 17 MCG/ACTUATION AEROSOL INHALER
INHALATION_SPRAY | RESPIRATORY_TRACT | 2 refills | Status: DC
Start: 2016-01-15 — End: 2016-03-10

## 2016-01-15 MED ORDER — INSULIN GLARGINE (U-100) 100 UNIT/ML (3 ML) SUBCUTANEOUS PEN
20.0000 [IU] | PEN_INJECTOR | Freq: Every evening | SUBCUTANEOUS | 1 refills | Status: DC
Start: 2016-01-15 — End: 2016-03-10

## 2016-01-15 NOTE — Progress Notes (Signed)
[facility] PRIMARY CARE  [address]  [id number]  [place] [address]  Office Visit    ID: [name]   DOB: 1955/11/21  Date of Service: 01/15/2016     Chief Complaint(s):   Chief Complaint   Patient presents with    Shortness of Breath    Diabetes       SUBJECTIVE:  Patient comes in today for follow-up on diabetes and COPD.  Patient reports that her breathing is much improved since her hospital visit.  Her average O2 sats have been anywhere from 94-98 percent.  She is requesting refills on her medications today.  She has not been taking her blood glucose regularly but reports about 2 weeks ago her fastings were anywhere from 150-190.  She denies any episodes of hypoglycemia.  She has been taking her medicines as prescribed.  Her main concern today is with pins and needle feeling in her toes.  This is been going on for about 3 months.  This is causing her pain at night and the inability to sleep comfortably.  She denies any injury or trauma.    ROS:  Constitutional: Denies fevers, chills, night sweats. No recent weight changes or fatigue.   Eyes: Denies change in vision. No irritation, erythema, discharge or pain.   Ears: Denies any difficulty with hearing. No ear pain, tinnitus or discharge.   Mouth/Throat: Denies any oral ulcers or other lesions. No sore throat, hoarseness or dysphagia.  Cardiovascular: Denies any chest pain, palpitations, PND or DOE  Respiratory: + shortness of breath   GI: Denies any abdominal pain, nausea, vomiting, diarrhea or constipation. No melena or hematochezia.  GU: Denies any dysuria, frequency, hematuria, hesitancy. Denies nocturia or incontinence.  Musculoskeletal: Denies any arthritis or edema. No muscle/joint pain or swelling.   Neurological: Pins and needles feeling in her feet   Emotional/Pscychiatric: Denies any memory issues. No history of depression or anxiety.  Skin: Denies any recent rashes or lesions.     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       Obesity (BMI 35.0-39.9 without comorbidity) 10/08/2012       Medications:  Current Outpatient Prescriptions   Medication Sig    albuterol (PROVENTIL) 2.5 mg/0.5 mL Inhalation Solution for Nebulization 2.5 mg by Nebulization route Four times a day    amLODIPine (NORVASC) 10 mg Oral Tablet Take 1 Tab (10 mg total) by mouth Once a day    aspirin 81 mg Oral Tablet, Chewable Take 1 Tab (81 mg total) by mouth Once a day (Patient not taking: Reported on 01/15/2016)    ATROVENT HFA 17 mcg/actuation Inhalation HFA Aerosol Inhaler oral inhaler inhale 2 puffs four times a day    fluticasone-salmeterol (ADVAIR) 500-50 mcg/dose Inhalation Disk with Device oral diskus inhaler Take 1 INHALATION by inhalation Twice daily    gabapentin (NEURONTIN) 300 mg Oral Capsule Take 1 Cap (300 mg total) by mouth Three times a day for 30 days    glimepiride (AMARYL) 4 mg Oral Tablet Take 1 Tab (4 mg total) by mouth Every morning    insulin glargine (LANTUS  SOLOSTAR) 100 unit/mL Subcutaneous Insulin Pen 20 Units by Subcutaneous route Every night    levalbuterol HCl (XOPENEX) 0.31 mg/3 mL Inhalation Solution for Nebulization 0.31 mg by Nebulization route Every 4 hours as needed    lisinopril (PRINIVIL) 40 mg Oral Tablet Take 1 Tab (40 mg total) by mouth Once a day    sitaGLIPtin (JANUVIA) 100 mg Oral Tablet Take 100 mg by mouth Once a day        Allergies:  Allergies   Allergen Reactions    Codeine  Other Adverse Reaction (Add comment)     Sick to stomach    Hydrocodone Nausea/ Vomiting    Metformin Diarrhea    Oxycodone Nausea/ Vomiting        Social History:  Social History   Substance Use Topics    Smoking status: Current  Every Day Smoker     Packs/day: 0.25     Years: 20.00     Types: Cigarettes    Smokeless tobacco: Never Used    Alcohol use No          OBJECTIVE:  BP (!) 188/104   Pulse (!) 108   Temp 37.2 C (98.9 F) (Oral)    Resp (!) 24   Wt 91.9 kg (202 lb 9.6 oz)   SpO2 94%   BMI 35.89 kg/m2     Physical Exam:  General: No apparent acute distress. Very pleasant.   Eyes: Conjunctiva are clear. No discharge or icterus noted.  HENT: Mucous membranes are moist. Nares are clear.    Lungs: Clear to auscultation bilaterally. Decreased breath sounds. No wheezes or rhonchi.    Cardiovascular: Normal rate and regular rhythm. No murmurs, rubs or gallops.  Abdomen: Soft. BS present. Non-tender. No rebound, guarding, or peritoneal signs.   Extremities: Atraumatic. No cyanosis. No significant peripheral edema.  Skin: Warm and dry.  No significant rashes or lesions  Neurologic: Strength and sensation grossly normal throughout.  Normal gait.    Psychiatric: Alert and oriented x 3. Affect within normal limits.    Diabetic foot exam:     Both feet without edema or ulcerations. Pulses normal bilaterally. Sensation normal bilaterally No edema bilaterally. No ulcerations or lesions bilaterally. Pulses normal bilaterally. Sensation normal bilaterally.       ASSESSMENT and PLAN:  1. COPD (chronic obstructive pulmonary disease) ([place]) -  patient reports breathing has improved since last visit.  She has tried to cut back on her cigarettes and is trying to wean herself off.  We discussed the importance of ultimately quitting smoking.   2. Diabetes ([place]) -  patient has been sporadically checking her blood sugars at home but has not checked in 2 weeks.  She reports she is averaging anywhere from 150-190 fasting.  She still drinking soda every now and then and reports she has been trying to watch her carbs in her diet.  Will see her back in 2 months for an A1c recheck.   3. Hypertension due to endocrine disorder -  hypertension not at goal will  prescribe Norvasc 10 milligrams daily and have her follow-up in 2 months.   4. Diabetic neuropathy ([place]) -  patient's with beginning stages of diabetic neuropathy in her feet.  She reports the pain is causing her not to sleep at night.  She is interested in starting gabapentin at this time.  Will Rx 300 milligrams t.i.d..  Follow-up in 2 months.          BMI addressed: Advised on  diet, weight loss, and exercise to reduce above normal BMI.            Hubbard Robinson, DO 01/15/2016, 12:30        Visit Diagnoses and associated Orders -- Summary:        ICD-10-CM    1. COPD (chronic obstructive pulmonary disease) (HCC) J44.9 ATROVENT HFA 17 mcg/actuation Inhalation HFA Aerosol Inhaler oral inhaler     fluticasone-salmeterol (ADVAIR) 500-50 mcg/dose Inhalation Disk with Device oral diskus inhaler   2. Diabetes ([place]) E11.9 insulin glargine (LANTUS SOLOSTAR) 100 unit/mL Subcutaneous Insulin Pen   3. Hypertension due to endocrine disorder I15.2 amLODIPine (NORVASC) 10 mg Oral Tablet   4. Diabetic neuropathy (HCC) E11.40 gabapentin (NEURONTIN) 300 mg Oral Capsule         Portions of this note may be dictated using voice recognition software.   Variances in spelling and vocabulary are possible and unintentional. Not all errors are caught/corrected. Please notify the [facility] if any discrepancies are noted or if the meaning of any statement is not clear.

## 2016-01-15 NOTE — Patient Instructions (Signed)
Green List www.realmealrevolution.com  This food list is specifically useful for anyone doing low carbohydrate for Diabetes Mellitus (DM) Type 2, Pre Diabetes, and Obesity reversal. Also a healthy foundation for those with strong genetic tendency for Diabetes Green is an eat freely list - you choose anything you like without worrying about the carbohydrate content as all the foods will be between 0 to 5g/100g. It will be almost impossible to overdo your carbohydrate intake by sticking to this group of foods. Overeating protein is not recommended, so eat a moderate amount of animal protein at each meal. Include as much fat as you are comfortable with -bearing in mind that "B[place]" (Low Carb DM reversal) is high in fat. Caution: even though these are eat freely foods, only eat when hungry, stop when full and do not overeat. The size and thickness of your palm without fingers is a good measure for a serving of animal Protein.    Green foods (Good foods to eat. Eat as much as you want):    ANIMAL PROTEIN (unless these have  a rating, they are all 0g/100g)  . All eggs  . All meats, poultry and game  . All natural and cured meats (pancetta, parma ham, coppa etc)  . All natural and cured sausages (salami, chorizo etc)  . All offal (organ meat)  . All seafood (except swordfish and tilefish - high mercury content)  . Broths    DAIRY (full fat)  . Cottage cheese  . Cream  . Cream cheese  . Full-cream Greek yoghurt  . Full-cream milk  . Hard cheeses  . Soft cheeses    FATS  . Any rendered animal fat  . Avocado oil  . Butter (not margarine)  . Cheese - firm, natural, full-fat, aged cheeses (not processed)  . Coconut oil  . Duck fat  . Ghee (Indian butter)  . Lard  . Macadamia oil  . Mayonnaise, full fat only (not from seeds oils)  . Olive oil    FLAVOURINGS AND CONDIMENTS  All flavourings and condiments are okay, provided they do not contain sugars and preservatives  or vegetable (seed) oils.    NUTS AND SEEDS  . Almonds  .  Flaxseeds (watch out for pre-ground flaxseeds, they go rancid quickly and become toxic)  . Macadamia nuts  . Pecan nuts  . Pine nuts  . Pumpkin seeds  . Sunflower seeds  . Walnuts    SWEETENERS  . Erythritol granules  . Stevia powder  . Xylitol granules    VEGETABLES  . All green leafy vegetables (spinach,  cabbage, lettuces etc)  . Any other vegetables grown above  the ground (except butternut)  . Artichoke hearts  . Asparagus  . Aubergines  . Avocados  . Broccoli  . Brussel sprouts  . Cabbage  . Cauliflower  . Celery  . Courgettes  . Leeks  . Mushrooms  . Olives  . Onions  . Peppers  . Pumpkin  . Radishes  . Sauerkraut  . Spring onions  . Tomatoes      Orange List www.realmealrevolution.com  Orange is made up of ingredients containing between 6g and 25g of carbs per 100g (6% - 25%). Chart your carbohydrates without getting obsessive and still obtain an excellent outcome. If you are endeavoring to go into ketosis, this list will assist you to stay under a total of 50g carbs for the day. These are all net carbs and they are all 23 to 25g per indicated amount. Ingredients   are all fresh unless otherwise indicated.    Orange foods (can eat occasionally in small amounts):    FRUITS  . Apples 1.5  . Bananas 1 small  . Blackberries 3.5 C  . Blueberries 1.5 C  . Cherries (sweet) 1 C  . Clementines 3  . Figs 3 small  . Gooseberries 1.5 C  . Grapes (green) under 1 C  . Guavas 2  . Kiwi fruits 3  . [place] 18  . Mangos, sliced, under 1 C  . Nectarines 2  . Oranges 2  . [place] 1  . Peaches 2  . Pears ([place]) 1  . Pineapple, sliced, 1 C  . Plums 4  . Pomegranate   . Prickly pears 4  . Quinces 2  . Raspberries 2 C  . Strawberries 25  . Watermelon 2 C    NUTS  . Cashews, raw, 6 T  . Chestnuts, raw, 1 C    SWEETENERS  . Honey 1 t    VEGETABLES  . Butternut 1.5 C  . Carrots 5  . Sweet potato 0.5 C    KEY  C = cups per day  T = tablespoons per day  t = teaspoons per day  g = grams per day  For example: 1.5 apples are all the  carbs you can have off the orange list for the day (if you want to go into ketosis and make sure you are under 50g total carbs for the day).      Red List www.realmealrevolution.com  Red will contain all the foods to avoid as they will be either toxic (e.g. seed oils, soy) or high-carbohydrate foods (e.g.potatoes, rice). We strongly suggest you avoid all the items on this list, or, at best, eat them very occasionally and restrict the amount when you do. They will do nothing to help you in your attempt to reach your goal.    Red foods (DO NOT EAT EVER):    BAKED GOODS  . All flours from grains - wheat flour, cornflour, rye flour, barley flour, pea flour, rice flour etc  . All forms of bread  . All grains - wheat, oats, barley, rye, amaranth, quinoa, teff etc  . Beans (dried)  . "Breaded" or battered foods  . Brans  . Breakfast cereals, muesli, granola of any kind  . Buckwheat  . Cakes, biscuits, confectionary  . Corn products - popcorn, polenta, corn thins, maize  . Couscous  . Crackers, cracker breads  . Millet  . Pastas, noodles  . Rice  . Rice cakes  . Sorghum  . Spelt  . Thickening agents such as gravy powder, maize starch or stock cubes      BEVERAGES  . Beer, cider  . Fizzy drinks (sodas) of any description other than carbonated water  . Lite, zero, diet drinks of any Description    DAIRY (low fat) / DAIRY-RELATED  . Cheese spreads, commercial spreads  . Coffee creamers  . Commercial almond milk  . Condensed milk  . Fat-free anything  . Ice cream  . Puddings  . Reduced-fat cow's milk  . Rice milk  . Soy milk    FATS  . All seed oils (safflower, sunflower, canola, grapeseed, cottonseed, corn)  . Chocolate  . Commercial sauces, marinades and salad dressings  . Hydrogenated or partially hydrogenated oils including margarine, vegetable oils, vegetable fats    FRUITS AND VEGETABLES  . Fruit juice of any kind  . Vegetable juices (other than   homemade with Green list vegetables)    GENERAL  . All fast food  . All  processed food  . Any food with added sugar such as glucose, dextrose etc    MEAT  . All unfermented soya (vegetarian  "protein")  . Meats cured with excessive sugar  . Vienna sausages, luncheon meats    STARCHY VEGETABLES  . Beetroots  . Legumes  . Parsnips  . Peanuts  . Peas  . Potatoes (regular)    SWEETENERS  . Agave anything  . Artificial sweeteners (aspartame, acesulfame K, saccharin, sucralose, splenda)  . Cordials  . Dried fruit  . Fructose  . Honey (except for 1 t on orange list)  . Malt  . Sugar  . Sugared or commercially pickled foods with sugar  . Sweets  . Syrups of any kind

## 2016-01-15 NOTE — Nursing Note (Signed)
Chief Complaint:   Chief Complaint     Shortness of Breath     Diabetes             Functional Health Screen  Functional Health Screening:     Patient is under 18:  No   Have you had a recent unexplained weight loss or gain?:  No   Because we are aware of abuse and domestic violence today, we ask all patients: Are you being hurt, hit, or frightened by anyone at your home or in your life?:  No   Do you have any basic needs within your home that are not being met? (such as Food, Shelter, [employer] fast streamer, Transportation):  No   Patient is under 18 and therefore has no Advance Directives:  No   Patient has:  No Advance   Patient has Advance Directive:  No   Patient offered:  Refused Packet   Screening unable to be completed:  No        BP (!) 188/104   Pulse (!) 108   Temp 37.2 C (98.9 F) (Oral)    Resp (!) 24   Wt 91.9 kg (202 lb 9.6 oz)   SpO2 94%   BMI 35.89 kg/m2  History   Smoking Status    Current Every Day Smoker    Packs/day: 0.25    Years: 20.00    Types: Cigarettes   Smokeless Tobacco    Never Used     Patient Health Rating           Depression Screening  PHQ Questionnaire     Allergies:  Allergies   Allergen Reactions    Codeine  Other Adverse Reaction (Add comment)     Sick to stomach    Hydrocodone Nausea/ Vomiting    Metformin Diarrhea    Oxycodone Nausea/ Vomiting     Medication History  Reviewed for OTC medication and any new medications, provider will review medication history  Results through Enter/Edit  No results found for this or any previous visit (from the past 24 hour(s)).  POCT Results  Care Team  Patient Care Team:  Laurence Aly, DO as PCP - General (FAMILY PRACTICE)    Demetrius Charity, LPN  07/15/4235, 02:30

## 2016-01-20 ENCOUNTER — Other Ambulatory Visit (INDEPENDENT_AMBULATORY_CARE_PROVIDER_SITE_OTHER): Payer: Self-pay | Admitting: Family Medicine

## 2016-01-20 MED ORDER — SITAGLIPTIN PHOSPHATE 100 MG TABLET
100.0000 mg | ORAL_TABLET | Freq: Every day | ORAL | 0 refills | Status: DC
Start: 2016-01-20 — End: 2016-04-19

## 2016-01-20 NOTE — Telephone Encounter (Signed)
Pt is requesting refill. [name]  01/20/2016, 12:21

## 2016-01-20 NOTE — Telephone Encounter (Signed)
Informed pt that script was sent pharm. Dawn Dotson  01/20/2016, 13:02

## 2016-03-10 ENCOUNTER — Other Ambulatory Visit (INDEPENDENT_AMBULATORY_CARE_PROVIDER_SITE_OTHER): Payer: Self-pay | Admitting: Family Medicine

## 2016-03-10 DIAGNOSIS — J449 Chronic obstructive pulmonary disease, unspecified: Secondary | ICD-10-CM

## 2016-03-10 DIAGNOSIS — E1140 Type 2 diabetes mellitus with diabetic neuropathy, unspecified: Secondary | ICD-10-CM

## 2016-03-10 DIAGNOSIS — E119 Type 2 diabetes mellitus without complications: Secondary | ICD-10-CM

## 2016-03-10 MED ORDER — ATROVENT HFA 17 MCG/ACTUATION AEROSOL INHALER
INHALATION_SPRAY | RESPIRATORY_TRACT | 2 refills | Status: DC
Start: 2016-03-10 — End: 2016-10-10

## 2016-03-10 MED ORDER — FLUTICASONE-SALMETEROL 500 MCG-50 MCG/DOSE DISK DEVICE - EAST
1.0000 | DISK | Freq: Two times a day (BID) | RESPIRATORY_TRACT | 2 refills | Status: DC
Start: 2016-03-10 — End: 2016-08-24

## 2016-03-10 MED ORDER — INSULIN GLARGINE (U-100) 100 UNIT/ML (3 ML) SUBCUTANEOUS PEN
20.0000 [IU] | PEN_INJECTOR | Freq: Every evening | SUBCUTANEOUS | 1 refills | Status: DC
Start: 2016-03-10 — End: 2016-04-07

## 2016-03-10 MED ORDER — GABAPENTIN 300 MG CAPSULE
300.0000 mg | ORAL_CAPSULE | Freq: Three times a day (TID) | ORAL | 0 refills | Status: DC
Start: 2016-03-10 — End: 2016-04-07

## 2016-03-10 NOTE — Telephone Encounter (Signed)
Patient called requesting refill and would scripts sent to [facility] on [address]. Dawn Dotson  03/10/2016, 09:45

## 2016-03-10 NOTE — Telephone Encounter (Signed)
Informed pt that script was sent to phar. Dawn Dotson  03/10/2016, 10:36

## 2016-03-16 ENCOUNTER — Encounter (INDEPENDENT_AMBULATORY_CARE_PROVIDER_SITE_OTHER): Payer: BC Managed Care – PPO | Admitting: Family Medicine

## 2016-04-01 ENCOUNTER — Encounter (HOSPITAL_BASED_OUTPATIENT_CLINIC_OR_DEPARTMENT_OTHER): Payer: Self-pay

## 2016-04-01 ENCOUNTER — Emergency Department (HOSPITAL_BASED_OUTPATIENT_CLINIC_OR_DEPARTMENT_OTHER)
Admission: EM | Admit: 2016-04-01 | Discharge: 2016-04-02 | Disposition: A | Discharge status: Home / Self Care | Payer: BC Managed Care – PPO | Attending: Emergency Medicine | Admitting: Emergency Medicine

## 2016-04-01 DIAGNOSIS — M1990 Unspecified osteoarthritis, unspecified site: Secondary | ICD-10-CM | POA: Insufficient documentation

## 2016-04-01 DIAGNOSIS — Z794 Long term (current) use of insulin: Secondary | ICD-10-CM | POA: Insufficient documentation

## 2016-04-01 DIAGNOSIS — F17210 Nicotine dependence, cigarettes, uncomplicated: Secondary | ICD-10-CM | POA: Insufficient documentation

## 2016-04-01 DIAGNOSIS — Z7982 Long term (current) use of aspirin: Secondary | ICD-10-CM | POA: Insufficient documentation

## 2016-04-01 DIAGNOSIS — J449 Chronic obstructive pulmonary disease, unspecified: Secondary | ICD-10-CM | POA: Insufficient documentation

## 2016-04-01 DIAGNOSIS — E119 Type 2 diabetes mellitus without complications: Secondary | ICD-10-CM | POA: Insufficient documentation

## 2016-04-01 DIAGNOSIS — Z7951 Long term (current) use of inhaled steroids: Secondary | ICD-10-CM | POA: Insufficient documentation

## 2016-04-01 DIAGNOSIS — Z888 Allergy status to other drugs, medicaments and biological substances status: Secondary | ICD-10-CM | POA: Insufficient documentation

## 2016-04-01 DIAGNOSIS — Z885 Allergy status to narcotic agent status: Secondary | ICD-10-CM | POA: Insufficient documentation

## 2016-04-01 DIAGNOSIS — M25532 Pain in left wrist: Secondary | ICD-10-CM | POA: Insufficient documentation

## 2016-04-01 DIAGNOSIS — I10 Essential (primary) hypertension: Secondary | ICD-10-CM | POA: Insufficient documentation

## 2016-04-01 MED ORDER — DICLOFENAC SODIUM 75 MG TABLET,DELAYED RELEASE: 75 mg | Tab | Freq: Two times a day (BID) | ORAL | 0 refills | 0 days | Status: DC

## 2016-04-01 MED ORDER — DICLOFENAC SODIUM 75 MG TABLET,DELAYED RELEASE
75.0000 mg | DELAYED_RELEASE_TABLET | Freq: Once | ORAL | Status: AC
Start: 2016-04-02 — End: 2016-04-02
  Administered 2016-04-02: 75 mg via ORAL
  Filled 2016-04-01: qty 1

## 2016-04-01 NOTE — ED Nurses Note (Signed)
Dr. Muyderman at bedside.

## 2016-04-01 NOTE — ED Provider Notes (Signed)
Victorino Sparrow, MD  [facility]  Emergency Department Visit Note    Date:  04/01/2016  Primary care provider:  Hubbard Robinson, DO  Means of arrival:  private car  History obtained from: patient  History limited by: none    Chief Complaint: Wrist Pain    HISTORY OF PRESENT ILLNESS     [name], date of birth 1955/04/23, is a 61 y.o. female who presents to the Emergency Department complaining of wrist pain.    The patient explains that she has been experiencing constant pain in her left wrist since this morning. She provides that this pain radiates into her left elbow and currently rates her pain as an 8 out of 10 in severity. She reports that this pain has gradually gotten worse throughout the day prompting her visit to the Emergency Department. Patient denies any recent injury to prompt her pain, recent loss of consciousness, chest pain, shortness of breath, and abdominal pain. She reports a past medical history of degenerative joint disease and carpal tunnel syndrome. Patient states that she also has diabetes.     REVIEW OF SYSTEMS     The pertinent positive and negative symptoms are as per HPI. All other systems reviewed and are negative.     PATIENT HISTORY     Past Medical History:  Past Medical History:   Diagnosis Date   . COPD (chronic obstructive pulmonary disease) ([place])    . Degenerative joint disease involving multiple joints    . Diabetes mellitus ([place])    . HTN (hypertension)    . Smoker        Past Surgical History:  Past Surgical History:   Procedure Laterality Date   . Hx carpal tunnel release         Family History:  Family Medical History     Problem Relation (Age of Onset)    COPD Mother    Coronary Artery Disease Father    Diabetes Mother, Sister, Brother, Paternal Grandmother        Social History:  Social History   Substance Use Topics   . Smoking status: Current Every Day Smoker     Packs/day: 0.25     Years: 20.00     Types: Cigarettes   . Smokeless tobacco: Never Used   .  Alcohol use No     History   Drug Use No       Medications:  Current Outpatient Prescriptions   Medication Sig   . albuterol (PROVENTIL) 2.5 mg/0.5 mL Inhalation Solution for Nebulization 2.5 mg by Nebulization route Four times a day   . amLODIPine (NORVASC) 10 mg Oral Tablet Take 1 Tab (10 mg total) by mouth Once a day   . aspirin 81 mg Oral Tablet, Chewable Take 1 Tab (81 mg total) by mouth Once a day (Patient not taking: Reported on 01/15/2016)   . ATROVENT HFA 17 mcg/actuation Inhalation HFA Aerosol Inhaler oral inhaler inhale 2 puffs four times a day   . fluticasone-salmeterol (ADVAIR) 500-50 mcg/dose Inhalation Disk with Device oral diskus inhaler Take 1 INHALATION by inhalation Twice daily   . gabapentin (NEURONTIN) 300 mg Oral Capsule Take 1 Cap (300 mg total) by mouth Three times a day for 30 days   . glimepiride (AMARYL) 4 mg Oral Tablet Take 1 Tab (4 mg total) by mouth Every morning   . insulin glargine (LANTUS SOLOSTAR U-100 INSULIN) 100 unit/mL Subcutaneous Insulin Pen 20 Units by Subcutaneous route Every night   .  levalbuterol HCl (XOPENEX) 0.31 mg/3 mL Inhalation Solution for Nebulization 0.31 mg by Nebulization route Every 4 hours as needed   . lisinopril (PRINIVIL) 40 mg Oral Tablet Take 1 Tab (40 mg total) by mouth Once a day   . sitaGLIPtin (JANUVIA) 100 mg Oral Tablet Take 1 Tab (100 mg total) by mouth Once a day for 90 days       Allergies:  Allergies   Allergen Reactions   . Codeine  Other Adverse Reaction (Add comment)     Sick to stomach   . Hydrocodone Nausea/ Vomiting   . Metformin Diarrhea   . Oxycodone Nausea/ Vomiting       PHYSICAL EXAM     Vitals:  Filed Vitals:    04/01/16 2116   BP: (!) 148/92   Pulse: 99   Resp: (!) 22   Temp: 36.8 C (98.3 F)   SpO2: 93%       Pulse ox  93% on None (Room Air) interpreted by me as: Normal    Constitutional: Appears uncomfortable.    Head: Normocephalic and atraumatic.   ENT: Moist mucous membranes. No erythema or exudates in the oropharynx.  Eyes:  EOM are normal. Pupils are equal, round, and reactive to light. No scleral icterus.   Neck: Neck supple. No meningismus.  Cardiovascular: Normal rate and regular rhythm. No murmur heard. 2+ distal pulses all 4 extremities. Nuerovascularly intact distally.   Pulmonary/Chest: Effort normal and breath sounds normal.   Abdominal: Soft. No distension. There is no tenderness.   Musculoskeletal: Normal range of motion. No edema. Diffusely tender on the ventral aspect of her left wrist. No warmth or erythema No clubbing or cyanosis.  Neurological: Patient is alert and oriented to person, place, and time. Strength and sensation normal in all extremities. Normal facial symmetry and speech.   Skin: Skin is warm and dry. No rash noted.       DIAGNOSTIC STUDIES     Procedures:    Splint application/recheck:  A velcro splint was applied to the left wrist  by nursing staff and recheck by myself, Nadene Rubins, MD. It was found to be applied properly. Distal neurovascular status was rechecked and intact. No complications or new complaints after the application of the splint.     ED PROGRESS NOTE / [facility] records reviewed by me:  I have reviewed the nurse's notes. I have reviewed the patient's problem list.     Orders Placed This Encounter   . diclofenac (VOLTAREN) delayed release tablet     11:38 PM: Initial evaluation is complete at this time. I discussed with the patient that I would have a nurse place the patient in a velcro wrist splint and that I will treat the patient with Voltaren PO. The Emergency Department impression was discussed in detail. There is no reasonable probability of an unstable emergency medical condition.  There is no indication for further evaluation or testing at this time.  The patient is currently stable for discharge. Patient will be discharged with a prescription for Voltaren PO. Patient is to follow up with [facility] by phone call. All questions and concerns  were answered to their satisfaction and they agree with the plan.  Indications for immediate return to the emergency department and the importance of timely follow up were discussed.     Pre-Disposition Vitals:  Filed Vitals:    04/01/16 2116 04/02/16 0017   BP: (!) 148/92 140/82  Pulse: 99 90   Resp: (!) 22 20   Temp: 36.8 C (98.3 F)    SpO2: 93%            CLINICAL IMPRESSION     1. Acute left wrist pain    DISPOSITION/PLAN     Discharged       Prescriptions:  New Prescriptions    DICLOFENAC SODIUM (VOLTAREN) 75 MG ORAL TABLET, DELAYED RELEASE (E.C.)    Take 1 Tab (75 mg total) by mouth Twice daily         Follow-Up:     [facility]  [address]  [address]  [place] [address]  [phone]  Call for urgent follow up for persistent symptoms      Condition at Disposition: Stable       SCRIBE Hamberg, SCRIBE scribed for Victorino Sparrow, MD on 04/01/2016 at 11:34 PM.     Documentation assistance provided for Victorino Sparrow, MD  by Beverlee Nims, SCRIBE. Information recorded by the scribe was done at my direction and has been reviewed and validated by me Trixie Dredge, Freddie Breech, MD.

## 2016-04-01 NOTE — ED Triage Notes (Signed)
Having pain in the left wrist up into the elbow.  No injury noted.  Woke this am like that.

## 2016-04-02 MED ADMIN — lactated Ringers intravenous solution: ORAL

## 2016-04-02 NOTE — ED Nurses Note (Signed)
Patient discharged home with family.  AVS reviewed with patient/care giver.  A written copy of the AVS and discharge instructions was given to the patient/care giver.  Questions sufficiently answered as needed.  Patient/care giver encouraged to follow up with PCP as indicated.  In the event of an emergency, patient/care giver instructed to call 911 or go to the nearest emergency room.        Current Discharge Medication List      START taking these medications.       Details    diclofenac sodium 75 mg Tablet, Delayed Release (E.C.)   Commonly known as:  VOLTAREN    75 mg, Oral, 2x/day   Qty:  30 Tab   Refills:  0         CONTINUE these medications - NO CHANGES were made during your visit.       Details    albuterol 2.5 mg/0.5 mL Solution for Nebulization   Commonly known as:  PROVENTIL    2.5 mg, Nebulization, 4x/day   Qty:  50 Each   Refills:  0       amLODIPine 10 mg Tablet   Commonly known as:  NORVASC    10 mg, Oral, Daily   Qty:  90 Tab   Refills:  4       aspirin 81 mg Tablet, Chewable    81 mg, Oral, Daily   Refills:  0       ATROVENT HFA 17 mcg/actuation HFA Aerosol Inhaler oral inhaler   Generic drug:  ipratropium bromide    inhale 2 puffs four times a day   Qty:  1 Inhaler   Refills:  2       fluticasone-salmeterol 500-50 mcg/dose Disk with Device oral diskus inhaler   Commonly known as:  ADVAIR    1 INHALATION, Inhalation, 2x/day   Qty:  1 Inhaler   Refills:  2       gabapentin 300 mg Capsule   Commonly known as:  NEURONTIN    300 mg, Oral, 3x/day   Qty:  90 Cap   Refills:  0       glimepiride 4 mg Tablet   Commonly known as:  AMARYL    4 mg, Oral, QAM   Qty:  90 Tab   Refills:  1       insulin glargine 100 unit/mL Insulin Pen   Commonly known as:  LANTUS SOLOSTAR U-100 INSULIN    20 Units, Subcutaneous, NIGHTLY   Qty:  1 Box   Refills:  1       lisinopril 40 mg Tablet   Commonly known as:  PRINIVIL    40 mg, Oral, Daily   Qty:  90 Tab   Refills:  1       sitaGLIPtin 100 mg Tablet   Commonly known as:   JANUVIA    100 mg, Oral, Daily   Qty:  90 Tab   Refills:  0       XOPENEX 0.31 mg/3 mL Solution for Nebulization   Generic drug:  levalbuterol HCl    0.31 mg, Q4H PRN   Refills:  0

## 2016-04-07 ENCOUNTER — Other Ambulatory Visit (INDEPENDENT_AMBULATORY_CARE_PROVIDER_SITE_OTHER): Payer: Self-pay | Admitting: Family Medicine

## 2016-04-07 DIAGNOSIS — E119 Type 2 diabetes mellitus without complications: Secondary | ICD-10-CM

## 2016-04-07 DIAGNOSIS — E1140 Type 2 diabetes mellitus with diabetic neuropathy, unspecified: Secondary | ICD-10-CM

## 2016-04-07 MED ORDER — GABAPENTIN 300 MG CAPSULE
300.0000 mg | ORAL_CAPSULE | Freq: Three times a day (TID) | ORAL | 0 refills | Status: DC
Start: 2016-04-07 — End: 2016-05-07

## 2016-04-07 MED ORDER — INSULIN GLARGINE (U-100) 100 UNIT/ML (3 ML) SUBCUTANEOUS PEN
20.0000 [IU] | PEN_INJECTOR | Freq: Every evening | SUBCUTANEOUS | 1 refills | Status: DC
Start: 2016-04-07 — End: 2016-08-24

## 2016-04-07 NOTE — Telephone Encounter (Signed)
Pt called requesting refill. Dawn Dotson  04/07/2016, 10:53

## 2016-07-28 ENCOUNTER — Encounter (INDEPENDENT_AMBULATORY_CARE_PROVIDER_SITE_OTHER): Payer: Self-pay

## 2016-08-15 ENCOUNTER — Telehealth (INDEPENDENT_AMBULATORY_CARE_PROVIDER_SITE_OTHER): Payer: Self-pay | Admitting: Family Medicine

## 2016-08-15 NOTE — Telephone Encounter (Signed)
Called patient and informed her that she needed a follow up with Dr. Sabra Heck before he will continue to prescribe gabapentin. Patient verbalized understanding and scheduled for 08/24/16.    Maye Hides, LPN  01/12/2447, 75:30

## 2016-08-24 ENCOUNTER — Encounter (INDEPENDENT_AMBULATORY_CARE_PROVIDER_SITE_OTHER): Payer: Self-pay

## 2016-08-24 ENCOUNTER — Ambulatory Visit (INDEPENDENT_AMBULATORY_CARE_PROVIDER_SITE_OTHER): Payer: BC Managed Care – PPO | Admitting: Family Medicine

## 2016-08-24 ENCOUNTER — Encounter (INDEPENDENT_AMBULATORY_CARE_PROVIDER_SITE_OTHER): Payer: Self-pay | Admitting: Family Medicine

## 2016-08-24 VITALS — BP 140/78 | HR 96 | Temp 98.3°F | Resp 20 | Ht 63.0 in | Wt 182.0 lb

## 2016-08-24 DIAGNOSIS — E119 Type 2 diabetes mellitus without complications: Secondary | ICD-10-CM

## 2016-08-24 DIAGNOSIS — E1140 Type 2 diabetes mellitus with diabetic neuropathy, unspecified: Secondary | ICD-10-CM

## 2016-08-24 DIAGNOSIS — I152 Hypertension secondary to endocrine disorders: Secondary | ICD-10-CM

## 2016-08-24 DIAGNOSIS — Z6832 Body mass index (BMI) 32.0-32.9, adult: Secondary | ICD-10-CM

## 2016-08-24 DIAGNOSIS — J449 Chronic obstructive pulmonary disease, unspecified: Secondary | ICD-10-CM

## 2016-08-24 DIAGNOSIS — E1165 Type 2 diabetes mellitus with hyperglycemia: Secondary | ICD-10-CM

## 2016-08-24 LAB — POCT EAST HGB A1C (AMB): POCT HGB A1C: 13.8 % — AB (ref 4–6)

## 2016-08-24 MED ORDER — INSULIN GLARGINE (U-100) 100 UNIT/ML (3 ML) SUBCUTANEOUS PEN
40.0000 [IU] | PEN_INJECTOR | Freq: Every evening | SUBCUTANEOUS | 1 refills | Status: DC
Start: 2016-08-24 — End: 2016-10-10

## 2016-08-24 MED ORDER — GLIMEPIRIDE 4 MG TABLET
4.0000 mg | ORAL_TABLET | Freq: Every morning | ORAL | 1 refills | Status: DC
Start: 2016-08-24 — End: 2016-10-10

## 2016-08-24 MED ORDER — SITAGLIPTIN PHOSPHATE 100 MG TABLET
100.0000 mg | ORAL_TABLET | Freq: Every day | ORAL | 1 refills | Status: DC
Start: 2016-08-24 — End: 2016-10-10

## 2016-08-24 MED ORDER — LISINOPRIL 40 MG TABLET
40.0000 mg | ORAL_TABLET | Freq: Every day | ORAL | 1 refills | Status: DC
Start: 2016-08-24 — End: 2016-10-10

## 2016-08-24 MED ORDER — GABAPENTIN 300 MG CAPSULE
300.0000 mg | ORAL_CAPSULE | Freq: Three times a day (TID) | ORAL | 0 refills | Status: DC
Start: 2016-08-24 — End: 2016-10-10

## 2016-08-24 MED ORDER — AMLODIPINE 10 MG TABLET
10.0000 mg | ORAL_TABLET | Freq: Every day | ORAL | 1 refills | Status: DC
Start: 2016-08-24 — End: 2016-10-10

## 2016-08-24 MED ORDER — FLUTICASONE-SALMETEROL 500 MCG-50 MCG/DOSE DISK DEVICE - EAST
1.0000 | DISK | Freq: Two times a day (BID) | RESPIRATORY_TRACT | 2 refills | Status: DC
Start: 2016-08-24 — End: 2016-10-10

## 2016-08-24 NOTE — Progress Notes (Signed)
[name]  Diabetes Check-up Visit      ID: [name] is a 61 y.o. female   Date of service: 08/24/2016     SUBJECTIVE:  [name] returns for follow-up of her diabetes. States she has been non compliant with her medications as she cannot afford them.  She spends her money elsewhere.  She is not eating low carb and still drinking soda.  She is urinating excessively and excessively thirsty.  Took lantus for one month then stopped.  Wants to start taking her medications again.      Diabetic Review of Systems:   Medication compliance:  noncompliant much of the time  Diabetic diet compliance:  noncompliant much of the time  Home glucose monitoring: are not performed  Other diabetic ROS: no polyuria or polydipsia, no chest pain, dyspnea or TIAs, no hypoglycemia, no medication side effects noted    Current exercise: no regular exercise  Any episodes of hypoglycemia? no  Known diabetic complications: peripheral neuropathy  Cardiovascular risk factors: diabetes mellitus, obesity, sedentary life style, hypertension, stress, post-menopausal  Eye exam current (within one year): no  Foot exam current (within one year): no  Influenza vaccine current: no  Pneumonia vaccine current: yes  Weight trend: decreasing steadily        Other symptoms and concerns:   Has a cyst on the top of her head which is secreting a white substance.  Has had this for at least a year.  Comes and goes.      Medications:  Current Outpatient Prescriptions   Medication Sig Dispense Refill   . albuterol (PROVENTIL) 2.5 mg/0.5 mL Inhalation Solution for Nebulization 2.5 mg by Nebulization route Four times a day 50 Each 0   . amLODIPine (NORVASC) 10 mg Oral Tablet Take 1 Tab (10 mg total) by mouth Once a day for 90 days 90 Tab 1   . aspirin 81 mg Oral Tablet, Chewable Take 1 Tab (81 mg total) by mouth Once a day (Patient not taking: Reported on 01/15/2016)     . ATROVENT HFA 17 mcg/actuation Inhalation HFA Aerosol Inhaler oral inhaler  inhale 2 puffs four times a day 1 Inhaler 2   . fluticasone-salmeterol (ADVAIR) 500-50 mcg/dose Inhalation Disk with Device oral diskus inhaler Take 1 INHALATION by inhalation Twice daily 1 Inhaler 2   . gabapentin (NEURONTIN) 300 mg Oral Capsule Take 1 Cap (300 mg total) by mouth Three times a day for 90 days 270 Cap 0   . glimepiride (AMARYL) 4 mg Oral Tablet Take 1 Tab (4 mg total) by mouth Every morning 90 Tab 1   . insulin glargine (LANTUS SOLOSTAR U-100 INSULIN) 100 unit/mL Subcutaneous Insulin Pen 40 Units by Subcutaneous route Every night 1 Box 1   . levalbuterol HCl (XOPENEX) 0.31 mg/3 mL Inhalation Solution for Nebulization 0.31 mg by Nebulization route Every 4 hours as needed     . lisinopril (PRINIVIL) 40 mg Oral Tablet Take 1 Tab (40 mg total) by mouth Once a day 90 Tab 1   . sitaGLIPtin (JANUVIA) 100 mg Oral Tablet Take 1 Tab (100 mg total) by mouth Once a day for 90 days 90 Tab 1   . [DISCONTINUED] amLODIPine (NORVASC) 10 mg Oral Tablet Take 1 Tab (10 mg total) by mouth Once a day 90 Tab 4   . [DISCONTINUED] fluticasone-salmeterol (ADVAIR) 500-50 mcg/dose Inhalation Disk with Device oral diskus inhaler Take 1 INHALATION by inhalation Twice daily 1 Inhaler 2   . [  DISCONTINUED] gabapentin (NEURONTIN) 300 mg Oral Capsule Take 300 mg by mouth Three times a day     . [DISCONTINUED] glimepiride (AMARYL) 4 mg Oral Tablet Take 1 Tab (4 mg total) by mouth Every morning 90 Tab 1   . [DISCONTINUED] insulin glargine (LANTUS SOLOSTAR U-100 INSULIN) 100 unit/mL Subcutaneous Insulin Pen 20 Units by Subcutaneous route Every night (Patient not taking: Reported on 08/24/2016) 1 Box 1   . [DISCONTINUED] lisinopril (PRINIVIL) 40 mg Oral Tablet Take 1 Tab (40 mg total) by mouth Once a day 90 Tab 1   . [DISCONTINUED] sitaGLIPtin (JANUVIA) 100 mg Oral Tablet Take 100 mg by mouth Once a day       No current facility-administered medications for this visit.        ASCVD The ASCVD Risk score [name] DC [name] al., 2013) failed to  calculate for the following reasons:    Cannot find a previous HDL lab    Cannot find a previous total cholesterol lab     On a Statin? No   Needs High intensity Statin: No:  Unsure why patient is not on statin  On ACE inhibitor or angiotensin II receptor blocker? Yes   On Aspirin? No     Past Medical History:  Patient Active Problem List    Diagnosis Date Noted   . 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 Diabetes (CMS [place]) 11/18/2015   . Morbid obesity 10/31/2014   . Tobacco abuse 10/11/2012   . Chronic respiratory failure with hypoxia (CMS HCC) 10/11/2012     PATIENT IS GOING TO GO HOME WITH 2 LITERS OF O2 BY NASAL CANNULA PER MINUTE.      Marland Kitchen Obesity (BMI 35.0-39.9 without comorbidity) 10/08/2012         Social History:  Social History   Substance Use Topics   . Smoking status: Current Every Day Smoker     Packs/day: 0.25     Years: 20.00     Types: Cigarettes   . Smokeless tobacco: Never Used   . Alcohol use No         OBJECTIVE:  BP 140/78  Pulse 96  Temp 36.8 C (98.3 F) (Oral)   Resp 20  Ht 1.6 m (5\' 3" )  Wt 82.6 kg (182 lb)  BMI 32.24 kg/m2    General: Well-appearing female, no acute distress.   Neck: Supple without JVD or bruit.   Heart: Normal S1, S2, Regular rate and rhythm without murmur or gallops.   Lungs: Clear to auscultation bilaterally without wheeze.   Extremities: No cyanosis or edema. Distal pulses 2+.   Diabetic foot exam:  No edema bilaterally. No ulcerations or lesions bilaterally. Pulses normal bilaterally. Decreased sensation bilaterally            Lab Review  Lab Results   Component Value Date    GFR >60 11/13/2015    GFR >60 10/11/2012     Lab Results   Component Value Date    CREATININE 0.57 11/13/2015    CREATININE 0.59 10/11/2012       Assessment/Plan:     Diabetes Mellitus type II, under poor control.  Patient Active Problem List    Diagnosis Date Noted   . 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 Diabetes (CMS [place]) 11/18/2015   . Morbid obesity  10/31/2014   . Tobacco abuse 10/11/2012   . Chronic respiratory failure with hypoxia (CMS HCC) 10/11/2012     PATIENT IS  GOING TO GO HOME WITH 2 LITERS OF O2 BY NASAL CANNULA PER MINUTE.      Marland Kitchen Obesity (BMI 35.0-39.9 without comorbidity) 10/08/2012       Orders Placed This Encounter   . lisinopril (PRINIVIL) 40 mg Oral Tablet   . insulin glargine (LANTUS SOLOSTAR U-100 INSULIN) 100 unit/mL Subcutaneous Insulin Pen   . glimepiride (AMARYL) 4 mg Oral Tablet   . sitaGLIPtin (JANUVIA) 100 mg Oral Tablet   . amLODIPine (NORVASC) 10 mg Oral Tablet   . gabapentin (NEURONTIN) 300 mg Oral Capsule   . fluticasone-salmeterol (ADVAIR) 500-50 mcg/dose Inhalation Disk with Device oral diskus inhaler     Diabetes - Not controlled, patient takes her medications when she can afford them.  Increase Lantus to 40 units though patient reluctant to start insulin at this time.  Continue Amyrayl and Januvia at this time.  Discussed low carb diet and exercise.  Needs eye exam.  Continue gabapentin for neuropathy     HTN - Not at goal but has not been taking medications.  Follow up in 3 months.     Hubbard Robinson, DO 08/24/2016, 14:37

## 2016-08-24 NOTE — Nursing Note (Signed)
Chief Complaint:   Chief Complaint     Diabetes             Functional Health Screen  Functional Health Screening:     Patient is under 18:  No   Have you had a recent unexplained weight loss or gain?:  No   Because we are aware of abuse and domestic violence today, we ask all patients: Are you being hurt, hit, or frightened by anyone at your home or in your life?:  No   Do you have any basic needs within your home that are not being met? (such as Food, Shelter, Games developer, Transportation):  No   Patient is under 18 and therefore has no Advance Directives:  No   Patient has:  No Advance   Patient has Advance Directive:  No   Patient offered:  Refused Packet   Screening unable to be completed:  No        BP 140/78  Pulse 96  Temp 36.8 C (98.3 F) (Oral)   Resp 20  Ht 1.6 m (_0 )  Wt 82.6 kg (182 lb)  BMI 32.24 kg/m2  History   Smoking Status   . Current Every Day Smoker   . Packs/day: 0.25   . Years: 20.00   . Types: Cigarettes   Smokeless Tobacco   . Never Used     Patient Health Rating           Depression Screening  PHQ Questionnaire     Allergies:  Allergies   Allergen Reactions   . Codeine  Other Adverse Reaction (Add comment)     Sick to stomach   . Hydrocodone Nausea/ Vomiting   . Metformin Diarrhea   . Oxycodone Nausea/ Vomiting     Medication History  Reviewed for OTC medication and any new medications, provider will review medication history  Results through Enter/Edit  No results found for this or any previous visit (from the past 24 hour(s)).  POCT Results  Care Team  Patient Care Team:  Hubbard Robinson, DO as PCP - General ([place])  Immunizations - last 24 hours     None        Maye Hides, LPN  9/70/2637, 85:88

## 2016-08-24 NOTE — Nursing Note (Signed)
08/24/16 1500   A1C   A1C 13.8   References Ranges 4 - 6%   A1C Lot # [id number]   Expiration Date 03/09/18   Internal Control Valid yes   Initials [facility]

## 2016-08-24 NOTE — Patient Instructions (Signed)
Green List www.realmealrevolution.com  This food list is specifically useful for anyone doing low carbohydrate for Diabetes Mellitus (DM) Type 2, Pre Diabetes, and Obesity reversal. Also a healthy foundation for those with strong genetic tendency for Diabetes Green is an eat freely list - you choose anything you like without worrying about the carbohydrate content as all the foods will be between 0 to 5g/100g. It will be almost impossible to overdo your carbohydrate intake by sticking to this group of foods. Overeating protein is not recommended, so eat a moderate amount of animal protein at each meal. Include as much fat as you are comfortable with -bearing in mind that "B[place]" (Low Carb DM reversal) is high in fat. Caution: even though these are eat freely foods, only eat when hungry, stop when full and do not overeat. The size and thickness of your palm without fingers is a good measure for a serving of animal Protein.    Green foods (Good foods to eat. Eat as much as you want):    ANIMAL PROTEIN (unless these have  a rating, they are all 0g/100g)  . All eggs  . All meats, poultry and game  . All natural and cured meats (pancetta, parma ham, coppa etc)  . All natural and cured sausages (salami, chorizo etc)  . All offal (organ meat)  . All seafood (except swordfish and tilefish - high mercury content)  . Broths    DAIRY (full fat)  . Cottage cheese  . Cream  . Cream cheese  . Full-cream Greek yoghurt  . Full-cream milk  . Hard cheeses  . Soft cheeses    FATS  . Any rendered animal fat  . Avocado oil  . Butter (not margarine)  . Cheese - firm, natural, full-fat, aged cheeses (not processed)  . Coconut oil  . Duck fat  . Ghee (Indian butter)  . Lard  . Macadamia oil  . Mayonnaise, full fat only (not from seeds oils)  . Olive oil    FLAVOURINGS AND CONDIMENTS  All flavourings and condiments are okay, provided they do not contain sugars and preservatives  or vegetable (seed) oils.    NUTS AND SEEDS  . Almonds  .  Flaxseeds (watch out for pre-ground flaxseeds, they go rancid quickly and become toxic)  . Macadamia nuts  . Pecan nuts  . Pine nuts  . Pumpkin seeds  . Sunflower seeds  . Walnuts    SWEETENERS  . Erythritol granules  . Stevia powder  . Xylitol granules    VEGETABLES  . All green leafy vegetables (spinach,  cabbage, lettuces etc)  . Any other vegetables grown above  the ground (except butternut)  . Artichoke hearts  . Asparagus  . Aubergines  . Avocados  . Broccoli  . Brussel sprouts  . Cabbage  . Cauliflower  . Celery  . Courgettes  . Leeks  . Mushrooms  . Olives  . Onions  . Peppers  . Pumpkin  . Radishes  . Sauerkraut  . Spring onions  . Tomatoes      Orange List www.realmealrevolution.com  Orange is made up of ingredients containing between 6g and 25g of carbs per 100g (6% - 25%). Chart your carbohydrates without getting obsessive and still obtain an excellent outcome. If you are endeavoring to go into ketosis, this list will assist you to stay under a total of 50g carbs for the day. These are all net carbs and they are all 23 to 25g per indicated amount. Ingredients   are all fresh unless otherwise indicated.    Orange foods (can eat occasionally in small amounts):    FRUITS  . Apples 1.5  . Bananas 1 small  . Blackberries 3.5 C  . Blueberries 1.5 C  . Cherries (sweet) 1 C  . Clementines 3  . Figs 3 small  . Gooseberries 1.5 C  . Grapes (green) under 1 C  . Guavas 2  . Kiwi fruits 3  . [place] 18  . Mangos, sliced, under 1 C  . Nectarines 2  . Oranges 2  . [place] 1  . Peaches 2  . Pears ([place]) 1  . Pineapple, sliced, 1 C  . Plums 4  . Pomegranate   . Prickly pears 4  . Quinces 2  . Raspberries 2 C  . Strawberries 25  . Watermelon 2 C    NUTS  . Cashews, raw, 6 T  . Chestnuts, raw, 1 C    SWEETENERS  . Honey 1 t    VEGETABLES  . Butternut 1.5 C  . Carrots 5  . Sweet potato 0.5 C    KEY  C = cups per day  T = tablespoons per day  t = teaspoons per day  g = grams per day  For example: 1.5 apples are all the  carbs you can have off the orange list for the day (if you want to go into ketosis and make sure you are under 50g total carbs for the day).      Red List www.realmealrevolution.com  Red will contain all the foods to avoid as they will be either toxic (e.g. seed oils, soy) or high-carbohydrate foods (e.g.potatoes, rice). We strongly suggest you avoid all the items on this list, or, at best, eat them very occasionally and restrict the amount when you do. They will do nothing to help you in your attempt to reach your goal.    Red foods (DO NOT EAT EVER):    BAKED GOODS  . All flours from grains - wheat flour, cornflour, rye flour, barley flour, pea flour, rice flour etc  . All forms of bread  . All grains - wheat, oats, barley, rye, amaranth, quinoa, teff etc  . Beans (dried)  . "Breaded" or battered foods  . Brans  . Breakfast cereals, muesli, granola of any kind  . Buckwheat  . Cakes, biscuits, confectionary  . Corn products - popcorn, polenta, corn thins, maize  . Couscous  . Crackers, cracker breads  . Millet  . Pastas, noodles  . Rice  . Rice cakes  . Sorghum  . Spelt  . Thickening agents such as gravy powder, maize starch or stock cubes      BEVERAGES  . Beer, cider  . Fizzy drinks (sodas) of any description other than carbonated water  . Lite, zero, diet drinks of any Description    DAIRY (low fat) / DAIRY-RELATED  . Cheese spreads, commercial spreads  . Coffee creamers  . Commercial almond milk  . Condensed milk  . Fat-free anything  . Ice cream  . Puddings  . Reduced-fat cow's milk  . Rice milk  . Soy milk    FATS  . All seed oils (safflower, sunflower, canola, grapeseed, cottonseed, corn)  . Chocolate  . Commercial sauces, marinades and salad dressings  . Hydrogenated or partially hydrogenated oils including margarine, vegetable oils, vegetable fats    FRUITS AND VEGETABLES  . Fruit juice of any kind  . Vegetable juices (other than   homemade with Green list vegetables)    GENERAL  . All fast food  . All  processed food  . Any food with added sugar such as glucose, dextrose etc    MEAT  . All unfermented soya (vegetarian  "protein")  . Meats cured with excessive sugar  . Vienna sausages, luncheon meats    STARCHY VEGETABLES  . Beetroots  . Legumes  . Parsnips  . Peanuts  . Peas  . Potatoes (regular)    SWEETENERS  . Agave anything  . Artificial sweeteners (aspartame, acesulfame K, saccharin, sucralose, splenda)  . Cordials  . Dried fruit  . Fructose  . Honey (except for 1 t on orange list)  . Malt  . Sugar  . Sugared or commercially pickled foods with sugar  . Sweets  . Syrups of any kind

## 2016-08-29 ENCOUNTER — Encounter (INDEPENDENT_AMBULATORY_CARE_PROVIDER_SITE_OTHER): Payer: Self-pay | Admitting: Family Medicine

## 2016-08-29 NOTE — Nursing Note (Signed)
Prior Authorization for Januvia 100 mg started through [employer] site [email or web address]. Waiting for response.    Maye Hides, LPN  3/37/4451, 46:04

## 2016-08-30 ENCOUNTER — Telehealth (INDEPENDENT_AMBULATORY_CARE_PROVIDER_SITE_OTHER): Payer: Self-pay | Admitting: Family Medicine

## 2016-08-30 NOTE — Telephone Encounter (Signed)
Received faxed approval for patient's Januvia 100 mg daily. Authorization is good until 08/30/17. [place] and informed them of approval.    Maye Hides, LPN  01/11/5850, 77:82

## 2016-10-10 ENCOUNTER — Other Ambulatory Visit (INDEPENDENT_AMBULATORY_CARE_PROVIDER_SITE_OTHER): Payer: Self-pay | Admitting: Family Medicine

## 2016-10-10 DIAGNOSIS — E119 Type 2 diabetes mellitus without complications: Secondary | ICD-10-CM

## 2016-10-10 DIAGNOSIS — E1140 Type 2 diabetes mellitus with diabetic neuropathy, unspecified: Secondary | ICD-10-CM

## 2016-10-10 DIAGNOSIS — I152 Hypertension secondary to endocrine disorders: Secondary | ICD-10-CM

## 2016-10-10 DIAGNOSIS — J449 Chronic obstructive pulmonary disease, unspecified: Secondary | ICD-10-CM

## 2016-10-10 NOTE — Telephone Encounter (Signed)
Patient states she requested 90 days however insurance won't cover 90 days. Patient is requesting 30 day supply called in to [facility]  [phone] (cell)

## 2016-10-11 MED ORDER — LISINOPRIL 40 MG TABLET
40.0000 mg | ORAL_TABLET | Freq: Every day | ORAL | 0 refills | Status: DC
Start: 2016-10-11 — End: 2017-01-14

## 2016-10-11 MED ORDER — INSULIN GLARGINE (U-100) 100 UNIT/ML (3 ML) SUBCUTANEOUS PEN
40.0000 [IU] | PEN_INJECTOR | Freq: Every evening | SUBCUTANEOUS | 1 refills | Status: DC
Start: 2016-10-11 — End: 2017-05-21

## 2016-10-11 MED ORDER — GABAPENTIN 300 MG CAPSULE
300.0000 mg | ORAL_CAPSULE | Freq: Three times a day (TID) | ORAL | 0 refills | Status: DC
Start: 2016-10-11 — End: 2017-01-14

## 2016-10-11 MED ORDER — SITAGLIPTIN PHOSPHATE 100 MG TABLET
100.0000 mg | ORAL_TABLET | Freq: Every day | ORAL | 0 refills | Status: DC
Start: 2016-10-11 — End: 2017-01-14

## 2016-10-11 MED ORDER — ATROVENT HFA 17 MCG/ACTUATION AEROSOL INHALER
INHALATION_SPRAY | RESPIRATORY_TRACT | 2 refills | Status: DC
Start: 2016-10-11 — End: 2017-05-05

## 2016-10-11 MED ORDER — GLIMEPIRIDE 4 MG TABLET
4.0000 mg | ORAL_TABLET | Freq: Every morning | ORAL | 0 refills | Status: DC
Start: 2016-10-11 — End: 2017-01-14

## 2016-10-11 MED ORDER — FLUTICASONE-SALMETEROL 500 MCG-50 MCG/DOSE DISK DEVICE - EAST
1.0000 | DISK | Freq: Two times a day (BID) | RESPIRATORY_TRACT | 2 refills | Status: AC
Start: 2016-10-11 — End: 2017-10-20

## 2016-10-11 MED ORDER — AMLODIPINE 10 MG TABLET
10.0000 mg | ORAL_TABLET | Freq: Every day | ORAL | 0 refills | Status: DC
Start: 2016-10-11 — End: 2017-01-14

## 2016-10-11 NOTE — Telephone Encounter (Signed)
Faxed 30 day prescription for gabapentin to [facility]. Received confirmation.  Maye Hides, LPN  14/06/477, 98:72

## 2016-11-30 ENCOUNTER — Encounter (INDEPENDENT_AMBULATORY_CARE_PROVIDER_SITE_OTHER): Payer: Self-pay | Admitting: Family Medicine

## 2017-01-14 ENCOUNTER — Other Ambulatory Visit (INDEPENDENT_AMBULATORY_CARE_PROVIDER_SITE_OTHER): Payer: Self-pay | Admitting: Family Medicine

## 2017-01-14 DIAGNOSIS — E119 Type 2 diabetes mellitus without complications: Secondary | ICD-10-CM

## 2017-01-14 DIAGNOSIS — E1140 Type 2 diabetes mellitus with diabetic neuropathy, unspecified: Secondary | ICD-10-CM

## 2017-01-14 DIAGNOSIS — I152 Hypertension secondary to endocrine disorders: Secondary | ICD-10-CM

## 2017-01-15 NOTE — Telephone Encounter (Signed)
pt request refills via [facility], [place]  01/15/2017, 16:22

## 2017-03-13 ENCOUNTER — Encounter (INDEPENDENT_AMBULATORY_CARE_PROVIDER_SITE_OTHER): Payer: Self-pay

## 2017-03-14 ENCOUNTER — Other Ambulatory Visit (INDEPENDENT_AMBULATORY_CARE_PROVIDER_SITE_OTHER): Payer: Self-pay | Admitting: Family Medicine

## 2017-03-14 DIAGNOSIS — E1140 Type 2 diabetes mellitus with diabetic neuropathy, unspecified: Secondary | ICD-10-CM

## 2017-03-14 DIAGNOSIS — J449 Chronic obstructive pulmonary disease, unspecified: Secondary | ICD-10-CM

## 2017-03-14 DIAGNOSIS — E119 Type 2 diabetes mellitus without complications: Secondary | ICD-10-CM

## 2017-03-14 DIAGNOSIS — I152 Hypertension secondary to endocrine disorders: Secondary | ICD-10-CM

## 2017-03-27 ENCOUNTER — Encounter (INDEPENDENT_AMBULATORY_CARE_PROVIDER_SITE_OTHER): Payer: Self-pay

## 2017-03-27 ENCOUNTER — Encounter (INDEPENDENT_AMBULATORY_CARE_PROVIDER_SITE_OTHER): Payer: Self-pay | Admitting: Family Medicine

## 2017-03-27 ENCOUNTER — Ambulatory Visit (INDEPENDENT_AMBULATORY_CARE_PROVIDER_SITE_OTHER): Payer: BC Managed Care – PPO | Admitting: Family Medicine

## 2017-03-27 VITALS — BP 140/78 | HR 96 | Temp 98.1°F | Resp 20 | Ht 63.0 in | Wt 202.6 lb

## 2017-03-27 DIAGNOSIS — I152 Hypertension secondary to endocrine disorders: Secondary | ICD-10-CM

## 2017-03-27 DIAGNOSIS — Z9119 Patient's noncompliance with other medical treatment and regimen: Secondary | ICD-10-CM

## 2017-03-27 DIAGNOSIS — Z1159 Encounter for screening for other viral diseases: Secondary | ICD-10-CM

## 2017-03-27 DIAGNOSIS — E119 Type 2 diabetes mellitus without complications: Secondary | ICD-10-CM

## 2017-03-27 DIAGNOSIS — J431 Panlobular emphysema: Secondary | ICD-10-CM

## 2017-03-27 DIAGNOSIS — F17210 Nicotine dependence, cigarettes, uncomplicated: Secondary | ICD-10-CM

## 2017-03-27 DIAGNOSIS — Z91199 Patient's noncompliance with other medical treatment and regimen due to unspecified reason: Secondary | ICD-10-CM | POA: Insufficient documentation

## 2017-03-27 DIAGNOSIS — Z720 Tobacco use: Secondary | ICD-10-CM

## 2017-03-27 DIAGNOSIS — Z1239 Encounter for other screening for malignant neoplasm of breast: Secondary | ICD-10-CM

## 2017-03-27 DIAGNOSIS — Z794 Long term (current) use of insulin: Secondary | ICD-10-CM

## 2017-03-27 DIAGNOSIS — E669 Obesity, unspecified: Secondary | ICD-10-CM

## 2017-03-27 DIAGNOSIS — Z1231 Encounter for screening mammogram for malignant neoplasm of breast: Secondary | ICD-10-CM

## 2017-03-27 DIAGNOSIS — Z23 Encounter for immunization: Secondary | ICD-10-CM

## 2017-03-27 MED ORDER — PIOGLITAZONE 15 MG TABLET
15.0000 mg | ORAL_TABLET | Freq: Every day | ORAL | 1 refills | Status: DC
Start: 2017-03-27 — End: 2017-11-16

## 2017-03-27 MED ORDER — ATORVASTATIN 40 MG TABLET
40.0000 mg | ORAL_TABLET | Freq: Every day | ORAL | 4 refills | Status: DC
Start: 2017-03-27 — End: 2018-12-17

## 2017-03-27 NOTE — Nursing Note (Signed)
Chief Complaint:   Chief Complaint            Medication Check         Functional Health Screen        Vital Signs  BP (!) 140/78   Pulse 96   Temp 36.7 C (98.1 F) (Oral)   Resp 20   Ht 1.6 m (5\' 3" )   Wt 91.9 kg (202 lb 9.6 oz)   BMI 35.89 kg/m       Social History     Tobacco Use   Smoking Status Current Every Day Smoker   . Packs/day: 1.50   . Years: 20.00   . Pack years: 30.00   . Types: Cigarettes   Smokeless Tobacco Never Used     Allergies  Allergies   Allergen Reactions   . Codeine  Other Adverse Reaction (Add comment)     Sick to stomach   . Hydrocodone Nausea/ Vomiting   . Metformin Diarrhea   . Oxycodone Nausea/ Vomiting     Medication History  Reviewed for OTC medication and any new medications, provider will review medication history  Care Team  Patient Care Team:  Hubbard Robinson, DO as PCP - General ([place])  Immunizations - last 24 hours     Date Immunization Status Dose Route/Site Given by    03/27/17 0000 Influenza Vaccine IM (ADMIN) Incomplete .5 mL Intramuscular/         [name], [place]  03/27/2017, 08:06

## 2017-03-27 NOTE — Progress Notes (Signed)
[facility], [facility]  [address]  [place] [address]  Office Visit    ID: [name]   DOB: May 18, 1955  Date of Service: 03/27/2017     Chief Complaint(s):   Chief Complaint   Patient presents with   . Medication Check       SUBJECTIVE:  [name] comes in today for 6 month follow-up on chronic medical conditions including COPD, diabetes uncontrolled, hypertension, depression, neuropathy.  [name] states that she has been taking all hermedications daily except she has not been taking her insulin every day.  States she does not like stick herself.  She states that she takes about 40 units every other day.  Denies any episodes of hypoglycemia.  States that she mainly just stays in her house and smokes.  States that she has cut back.  Has a difficult time affording her medications.  States that she does have some shortness of breath with exertion but reports this isn't bad.      ROS:  Constitutional: Denies fevers, chills, night sweats. No recent weight changes or fatigue.   Eyes: Denies change in vision. No irritation, erythema, discharge or pain.   Ears: Denies any difficulty with hearing. No ear pain, tinnitus or discharge.   Mouth/Throat: Denies any oral ulcers or other lesions. No sore throat, hoarseness or dysphagia.  Cardiovascular: Denies any chest pain, palpitations, PND or DOE  Respiratory: + SOb and cough  GI: Denies any abdominal pain, nausea, vomiting, diarrhea or constipation. No melena or hematochezia.  GU: Denies any dysuria, frequency, hematuria, hesitancy. Denies nocturia or incontinence.  + polyuria and polydipsia   Musculoskeletal: Denies any arthritis or edema. No muscle/joint pain or swelling.   Neurological: No history of syncope or seizures. Denies any dizziness or headaches.  Emotional/Pscychiatric: + depression  Skin: Denies any recent rashes or lesions.     Past Medical History:  Patient Active Problem List    Diagnosis Date Noted   . Hypertension due to endocrine  disorder 01/15/2016   . Diabetes (CMS [place]) 11/18/2015   . Morbid obesity 10/31/2014   . Tobacco abuse 10/11/2012   . Chronic respiratory failure with hypoxia (CMS HCC) 10/11/2012     PATIENT IS GOING TO GO HOME WITH 2 LITERS OF O2 BY NASAL CANNULA PER MINUTE.      Marland Kitchen Obesity (BMI 35.0-39.9 without comorbidity) 10/08/2012       Medications:  Current Outpatient Medications   Medication Sig   . albuterol (PROVENTIL) 2.5 mg/0.5 mL Inhalation Solution for Nebulization 2.5 mg by Nebulization route Four times a day   . amLODIPine (NORVASC) 10 mg Oral Tablet TAKE 1 TABLET BY MOUTH ONCE DAILY   . aspirin 81 mg Oral Tablet, Chewable Take 1 Tab (81 mg total) by mouth Once a day   . atorvastatin (LIPITOR) 40 mg Oral Tablet Take 1 Tab (40 mg total) by mouth Once a day   . ATROVENT HFA 17 mcg/actuation Inhalation HFA Aerosol Inhaler oral inhaler inhale 2 puffs four times a day   . fluticasone-salmeterol (ADVAIR) 500-50 mcg/dose Inhalation Disk with Device oral diskus inhaler Take 1 INHALATION by inhalation Twice daily for 30 days   . gabapentin (NEURONTIN) 300 mg Oral Capsule TAKE 1 CAPSULE BY MOUTH THREE TIMES DAILY FOR 30 DAYS   . insulin glargine (LANTUS SOLOSTAR U-100 INSULIN) 100 unit/mL Subcutaneous Insulin Pen 40 Units by Subcutaneous route Every night   . JANUVIA 100 mg Oral Tablet TAKE 1 TABLET  BY MOUTH ONCE DAILY   . lisinopril (PRINIVIL) 40 mg Oral Tablet TAKE 1 TABLET BY MOUTH ONCE DAILY   . pioglitazone (ACTOS) 15 mg Oral Tablet Take 1 Tab (15 mg total) by mouth Once a day for 90 days        Allergies:  Allergies   Allergen Reactions   . Codeine  Other Adverse Reaction (Add comment)     Sick to stomach   . Hydrocodone Nausea/ Vomiting   . Metformin Diarrhea   . Oxycodone Nausea/ Vomiting        Social History:  Social History     Tobacco Use   . Smoking status: Current Every Day Smoker     Packs/day: 1.50     Years: 20.00     Pack years: 30.00     Types: Cigarettes   . Smokeless tobacco: Never Used   Substance Use  Topics   . Alcohol use: No   . Drug use: No          OBJECTIVE:  BP (!) 140/78   Pulse 96   Temp 36.7 C (98.1 F) (Oral)   Resp 20   Ht 1.6 m (5\' 3" )   Wt 91.9 kg (202 lb 9.6 oz)   BMI 35.89 kg/m          Physical Exam:  General: No apparent acute distress. Very pleasant.  Obese.  Eyes: Conjunctiva are clear. No discharge or icterus noted.  HENT: Mucous membranes are moist. Nares are clear. Posterior oropharynx is clear without erythema or exudates.    Neck: Supple.  Thyroid palpated as normal  Lungs: Decreased breath sounds bilaterally but clear.    Cardiovascular: Normal rate and regular rhythm. No murmurs, rubs or gallops.  Abdomen: Soft. BS present. Non-tender. No rebound, guarding, or peritoneal signs.   Extremities: Atraumatic. No cyanosis. No significant peripheral edema.  Skin: Warm and dry.  No significant rashes or lesions  Psychiatric: Alert and oriented x 3. Affect within normal limits.    Diabetic foot exam:  Both feet without edema or ulcerations. Pulses normal bilaterally. Sensation normal bilaterally          ASSESSMENT and PLAN:  1. Type 2 diabetes mellitus without complication, with long-term current use of insulin (CMS HCC) -  Will check labs, she is non compliant with diet and insulin.  Discussed the importance of controlling her diabetes.  Can continue Gabapentin for neuropathy.    2. Immunization due -  Prevnar and influenza vaccine given today.    3. Breast cancer screening -  Mammogram ordered.    4. Hypertension due to endocrine disorder -  At goal continue Norvasc and Lisinopril    5. Obesity (BMI 35.0-39.9 without comorbidity)    6. Panlobular emphysema (CMS HCC) -  Discussed the need to quit smoking, will continue Atrovent and Advair   7. Encounter for hepatitis C screening test for low risk patient               Depression screening follow up plan of care: Suicide risk assessment completed and patient  is not suicidal      Hubbard Robinson, DO 03/27/2017, 08:21        Visit Diagnoses  and associated Orders -- Summary:        ICD-10-CM    1. Type 2 diabetes mellitus without complication, with long-term current use of insulin (CMS HCC) E11.9 COMPREHENSIVE METABOLIC PROFILE - [facility] ONLY    Z79.4 CBC/DIFF  HGA1C (HEMOGLOBIN A1C WITH EST AVG GLUCOSE)     LIPID PANEL     atorvastatin (LIPITOR) 40 mg Oral Tablet     pioglitazone (ACTOS) 15 mg Oral Tablet   2. Immunization due Z23 Influenza Vaccine IM Age 46mo through Adult 0.5ML(admin)     Pneumovax Administration   3. Breast cancer screening Z12.31 Mammogram: Screening Bilateral   4. Hypertension due to endocrine disorder I15.2    5. Obesity (BMI 35.0-39.9 without comorbidity) E66.9    6. Panlobular emphysema (CMS HCC) J43.1    7. Encounter for hepatitis C screening test for low risk patient Z11.59 HEPATITIS C ANTIBODY SCREEN WITH REFLEX TO HCV PCR         Portions of this note may be dictated using voice recognition software.   Variances in spelling and vocabulary are possible and unintentional. Not all errors are caught/corrected. Please notify the [facility] if any discrepancies are noted or if the meaning of any statement is not clear.

## 2017-04-06 ENCOUNTER — Other Ambulatory Visit (HOSPITAL_BASED_OUTPATIENT_CLINIC_OR_DEPARTMENT_OTHER): Payer: Self-pay

## 2017-04-07 ENCOUNTER — Ambulatory Visit
Admission: RE | Admit: 2017-04-07 | Discharge: 2017-04-07 | Disposition: A | Discharge status: Home / Self Care | Payer: BC Managed Care – PPO | Source: Ambulatory Visit | Attending: Family Medicine | Admitting: Family Medicine

## 2017-04-07 ENCOUNTER — Other Ambulatory Visit (INDEPENDENT_AMBULATORY_CARE_PROVIDER_SITE_OTHER): Payer: Self-pay | Admitting: Family Medicine

## 2017-04-07 ENCOUNTER — Ambulatory Visit: Payer: BC Managed Care – PPO

## 2017-04-07 DIAGNOSIS — Z1239 Encounter for other screening for malignant neoplasm of breast: Secondary | ICD-10-CM

## 2017-04-07 DIAGNOSIS — E119 Type 2 diabetes mellitus without complications: Secondary | ICD-10-CM

## 2017-04-07 DIAGNOSIS — Z1159 Encounter for screening for other viral diseases: Secondary | ICD-10-CM

## 2017-04-07 DIAGNOSIS — Z794 Long term (current) use of insulin: Secondary | ICD-10-CM | POA: Insufficient documentation

## 2017-04-07 DIAGNOSIS — Z1231 Encounter for screening mammogram for malignant neoplasm of breast: Secondary | ICD-10-CM | POA: Insufficient documentation

## 2017-04-07 LAB — LIPID PANEL
CHOL/HDL RATIO: 6.3
CHOLESTEROL: 265 mg/dL — ABNORMAL HIGH (ref 120–199)
HDL CHOL: 42 mg/dL (ref >39)
LDL CALC: 185 mg/dL — ABNORMAL HIGH (ref <=130)
TRIGLYCERIDES: 188 mg/dL — ABNORMAL HIGH (ref <150)
VLDL CALC: 38 mg/dL — ABNORMAL HIGH (ref 5–35)

## 2017-04-07 LAB — COMPREHENSIVE METABOLIC PROFILE - BMC/JMC ONLY
ALBUMIN/GLOBULIN RATIO: 0.9 (ref 0.8–2.0)
ALBUMIN: 3.7 g/dL (ref 3.5–5.0)
ALBUMIN: 3.7 g/dL (ref 3.5–5.0)
ALKALINE PHOSPHATASE: 89 U/L (ref 38–126)
ALT (SGPT): 12 U/L — ABNORMAL LOW (ref 14–54)
ANION GAP: 9 mmol/L (ref 3–11)
AST (SGOT): 14 U/L — ABNORMAL LOW (ref 15–41)
BILIRUBIN TOTAL: 0.6 mg/dL (ref 0.3–1.2)
BUN/CREA RATIO: 2 — ABNORMAL LOW (ref 6–22)
BUN: 1 mg/dL — ABNORMAL LOW (ref 6–20)
CALCIUM: 9.2 mg/dL (ref 8.6–10.3)
CHLORIDE: 97 mmol/L — ABNORMAL LOW (ref 101–111)
CO2 TOTAL: 26 mmol/L (ref 22–32)
CREATININE: 0.44 mg/dL (ref 0.44–1.00)
ESTIMATED GFR: >60 mL/min/1.73mˆ2 (ref >60)
GLUCOSE: 180 mg/dL — ABNORMAL HIGH (ref 70–110)
POTASSIUM: 4 mmol/L (ref 3.4–5.1)
PROTEIN TOTAL: 8 g/dL (ref 6.4–8.3)
SODIUM: 132 mmol/L — ABNORMAL LOW (ref 136–145)

## 2017-04-07 LAB — CBC WITH DIFF
BASOPHIL #: 0 10*3/uL (ref 0.00–0.10)
BASOPHIL #: 0 x10ˆ3/uL (ref 0.00–0.10)
BASOPHIL %: 0 % (ref 0–3)
EOSINOPHIL #: 0.2 10*3/uL (ref 0.00–0.50)
EOSINOPHIL %: 2 % (ref 0–5)
HCT: 44.9 % (ref 36.0–45.0)
HGB: 15.1 g/dL (ref 12.0–15.5)
HGB: 15.1 g/dL (ref 12.0–15.5)
LYMPHOCYTE #: 2.8 x10ˆ3/uL (ref 1.00–4.80)
LYMPHOCYTE %: 25 % (ref 15–43)
MCH: 30.9 pg (ref 27.5–33.2)
MCHC: 33.6 g/dL (ref 32.0–36.0)
MCV: 91.8 fL (ref 82.0–97.0)
MONOCYTE #: 1.1 x10ˆ3/uL — ABNORMAL HIGH (ref 0.20–0.90)
MONOCYTE %: 10 % (ref 5–12)
MPV: 8.4 fL (ref 7.4–10.5)
NEUTROPHIL #: 7.3 10*3/uL — ABNORMAL HIGH (ref 1.50–6.50)
NEUTROPHIL %: 64 % (ref 43–76)
PLATELETS: 353 x10ˆ3/uL (ref 150–450)
RBC: 4.88 10*6/uL (ref 4.00–5.10)
RDW: 14.4 % (ref 11.0–16.0)
WBC: 11.5 x10ˆ3/uL — ABNORMAL HIGH (ref 4.0–11.0)

## 2017-04-07 LAB — HGA1C (HEMOGLOBIN A1C WITH EST AVG GLUCOSE)
ESTIMATED AVERAGE GLUCOSE: 183 mg/dL
HEMOGLOBIN A1C: 8 % — ABNORMAL HIGH (ref <5.7)

## 2017-04-07 LAB — HEPATITIS C ANTIBODY SCREEN WITH REFLEX TO HCV PCR: HCV ANTIBODY QUALITATIVE: NEGATIVE

## 2017-04-09 NOTE — Progress Notes (Signed)
Normal Mammogram.    Hubbard Robinson, DO  04/09/2017, 20:51

## 2017-04-09 NOTE — Progress Notes (Signed)
Elevated A1C which she is non compliant with her insulin all the time.  Also BUN appears to be misprinted.      Hubbard Robinson, DO  04/09/2017, 20:55

## 2017-05-05 ENCOUNTER — Other Ambulatory Visit (INDEPENDENT_AMBULATORY_CARE_PROVIDER_SITE_OTHER): Payer: Self-pay | Admitting: Family Medicine

## 2017-05-05 DIAGNOSIS — J449 Chronic obstructive pulmonary disease, unspecified: Secondary | ICD-10-CM

## 2017-05-05 MED ORDER — FLUTICASONE-SALMETEROL 500 MCG-50 MCG/DOSE DISK DEVICE - EAST
1.00 | DISK | Freq: Two times a day (BID) | RESPIRATORY_TRACT | 2 refills | Status: DC
Start: 2017-05-05 — End: 2017-05-24

## 2017-05-05 NOTE — Telephone Encounter (Signed)
Patient called in requesting a refill. Wynona Canes, [place]  05/05/2017, 09:18

## 2017-05-21 ENCOUNTER — Inpatient Hospital Stay (HOSPITAL_BASED_OUTPATIENT_CLINIC_OR_DEPARTMENT_OTHER)
Admission: EM | Admit: 2017-05-21 | Discharge: 2017-05-24 | DRG: 193 | Disposition: A | Discharge status: Home / Self Care | Payer: BC Managed Care – PPO | Attending: Internal Medicine | Admitting: Internal Medicine

## 2017-05-21 ENCOUNTER — Encounter (HOSPITAL_BASED_OUTPATIENT_CLINIC_OR_DEPARTMENT_OTHER): Payer: Self-pay

## 2017-05-21 ENCOUNTER — Emergency Department (HOSPITAL_BASED_OUTPATIENT_CLINIC_OR_DEPARTMENT_OTHER): Payer: BC Managed Care – PPO

## 2017-05-21 DIAGNOSIS — J440 Chronic obstructive pulmonary disease with acute lower respiratory infection: Secondary | ICD-10-CM | POA: Diagnosis present

## 2017-05-21 DIAGNOSIS — Z9981 Dependence on supplemental oxygen: Secondary | ICD-10-CM

## 2017-05-21 DIAGNOSIS — J9621 Acute and chronic respiratory failure with hypoxia: Secondary | ICD-10-CM | POA: Diagnosis present

## 2017-05-21 DIAGNOSIS — E119 Type 2 diabetes mellitus without complications: Secondary | ICD-10-CM | POA: Diagnosis present

## 2017-05-21 DIAGNOSIS — Z6833 Body mass index (BMI) 33.0-33.9, adult: Secondary | ICD-10-CM

## 2017-05-21 DIAGNOSIS — J441 Chronic obstructive pulmonary disease with (acute) exacerbation: Secondary | ICD-10-CM | POA: Diagnosis present

## 2017-05-21 DIAGNOSIS — F17210 Nicotine dependence, cigarettes, uncomplicated: Secondary | ICD-10-CM | POA: Diagnosis present

## 2017-05-21 DIAGNOSIS — J9602 Acute respiratory failure with hypercapnia: Secondary | ICD-10-CM | POA: Diagnosis present

## 2017-05-21 DIAGNOSIS — I152 Hypertension secondary to endocrine disorders: Secondary | ICD-10-CM | POA: Diagnosis present

## 2017-05-21 DIAGNOSIS — R0902 Hypoxemia: Secondary | ICD-10-CM

## 2017-05-21 DIAGNOSIS — E669 Obesity, unspecified: Secondary | ICD-10-CM | POA: Diagnosis present

## 2017-05-21 DIAGNOSIS — E1165 Type 2 diabetes mellitus with hyperglycemia: Secondary | ICD-10-CM | POA: Diagnosis present

## 2017-05-21 DIAGNOSIS — F17200 Nicotine dependence, unspecified, uncomplicated: Secondary | ICD-10-CM | POA: Diagnosis present

## 2017-05-21 DIAGNOSIS — E785 Hyperlipidemia, unspecified: Secondary | ICD-10-CM | POA: Diagnosis present

## 2017-05-21 DIAGNOSIS — Z794 Long term (current) use of insulin: Secondary | ICD-10-CM

## 2017-05-21 DIAGNOSIS — Z79899 Other long term (current) drug therapy: Secondary | ICD-10-CM

## 2017-05-21 DIAGNOSIS — Z7982 Long term (current) use of aspirin: Secondary | ICD-10-CM

## 2017-05-21 DIAGNOSIS — I10 Essential (primary) hypertension: Secondary | ICD-10-CM | POA: Diagnosis present

## 2017-05-21 DIAGNOSIS — Z885 Allergy status to narcotic agent status: Secondary | ICD-10-CM

## 2017-05-21 DIAGNOSIS — J189 Pneumonia, unspecified organism: Principal | ICD-10-CM | POA: Diagnosis present

## 2017-05-21 DIAGNOSIS — Z888 Allergy status to other drugs, medicaments and biological substances status: Secondary | ICD-10-CM

## 2017-05-21 DIAGNOSIS — J9611 Chronic respiratory failure with hypoxia: Secondary | ICD-10-CM

## 2017-05-21 DIAGNOSIS — J9600 Acute respiratory failure, unspecified whether with hypoxia or hypercapnia: Secondary | ICD-10-CM | POA: Diagnosis present

## 2017-05-21 DIAGNOSIS — Z7951 Long term (current) use of inhaled steroids: Secondary | ICD-10-CM

## 2017-05-21 HISTORY — DX: Dependence on supplemental oxygen: Z99.81

## 2017-05-21 HISTORY — DX: Presence of spectacles and contact lenses: Z97.3

## 2017-05-21 LAB — LACTIC ACID LEVEL: LACTIC ACID: 1.4 mmol/L (ref 0.5–2.0)

## 2017-05-21 LAB — ARTERIAL BLOOD GAS (RESULTS)- BMC/JMC
%FIO2: 40 %
(T) PCO2: 31.3 mm/Hg — ABNORMAL HIGH (ref 22.0–29.0)
ALLEN TEST: POSITIVE
BASE EXCESS: 1.9 mmol/L (ref <=3.0)
BICARBONATE: 29.6 mmol/L — ABNORMAL HIGH (ref 21.0–28.0)
O2 SATURATION: 91.3 % — ABNORMAL LOW (ref 93.0–98.0)
PCO2: 56 mm/Hg — ABNORMAL HIGH (ref 35.00–45.00)
PH: 7.33 — ABNORMAL LOW (ref 7.35–7.45)
PO2: 68 mm/Hg — ABNORMAL LOW (ref 83–108)
RESPIRATORY RATE: 16

## 2017-05-21 LAB — CBC WITH DIFF
BASOPHIL #: 0.1 x10ˆ3/uL (ref 0.00–0.10)
BASOPHIL %: 1 % (ref 0–3)
EOSINOPHIL #: 0 x10ˆ3/uL (ref 0.00–0.50)
EOSINOPHIL %: 0 % (ref 0–5)
HCT: 47.1 % — ABNORMAL HIGH (ref 36.0–45.0)
HGB: 15.2 g/dL (ref 12.0–15.5)
LYMPHOCYTE #: 3.8 x10ˆ3/uL (ref 1.00–4.80)
LYMPHOCYTE %: 24 % (ref 15–43)
MCH: 30.1 pg (ref 27.5–33.2)
MCHC: 32.2 g/dL (ref 32.0–36.0)
MCV: 93.6 fL (ref 82.0–97.0)
MONOCYTE #: 1.3 x10ˆ3/uL — ABNORMAL HIGH (ref 0.20–0.90)
MONOCYTE %: 8 % (ref 5–12)
MPV: 8.2 fL (ref 7.4–10.5)
NEUTROPHIL #: 10.6 x10ˆ3/uL — ABNORMAL HIGH (ref 1.50–6.50)
NEUTROPHIL %: 67 % (ref 43–76)
PLATELETS: 330 x10ˆ3/uL (ref 150–450)
RBC: 5.04 x10ˆ6/uL (ref 4.00–5.10)
RDW: 14.4 % (ref 11.0–16.0)
RDW: 14.4 % (ref 11.0–16.0)
WBC: 15.9 x10ˆ3/uL — ABNORMAL HIGH (ref 4.0–11.0)

## 2017-05-21 LAB — COMPREHENSIVE METABOLIC PROFILE - BMC/JMC ONLY
ALBUMIN: 3.6 g/dL (ref 3.5–5.0)
ALT (SGPT): 20 U/L (ref 14–54)
ANION GAP: 6 mmol/L (ref 3–11)
AST (SGOT): 22 U/L (ref 15–41)
BUN/CREA RATIO: 8 (ref 6–22)
BUN: 4 mg/dL — ABNORMAL LOW (ref 6–20)
CALCIUM: 8.8 mg/dL (ref 8.6–10.3)
CHLORIDE: 97 mmol/L — ABNORMAL LOW (ref 101–111)
CREATININE: 0.53 mg/dL (ref 0.44–1.00)
GLUCOSE: 436 mg/dL (ref 70–110)
POTASSIUM: 4.1 mmol/L (ref 3.4–5.1)
SODIUM: 133 mmol/L — ABNORMAL LOW (ref 136–145)

## 2017-05-21 LAB — TROPONIN-I
TROPONIN I: <0.03 ng/mL (ref <=0.06)
TROPONIN I: <0.03 ng/mL (ref <=0.06)
TROPONIN I: <0.03 ng/mL (ref <=0.06)
TROPONIN I: <0.03 ng/mL (ref <=0.06)

## 2017-05-21 LAB — POC FINGERSTICK GLUCOSE - BMC/JMC (RESULTS)
GLUCOSE, POC: 393 mg/dl — ABNORMAL HIGH (ref 60–100)
GLUCOSE, POC: 367 mg/dl — ABNORMAL HIGH (ref 60–100)
GLUCOSE, POC: 401 mg/dl (ref 60–100)
GLUCOSE, POC: 432 mg/dl (ref 60–100)

## 2017-05-21 LAB — RED TOP TUBE

## 2017-05-21 LAB — CREATINE KINASE (CK), TOTAL, SERUM: CREATINE KINASE: 49 U/L (ref 38–234)

## 2017-05-21 LAB — BLUE TOP TUBE

## 2017-05-21 LAB — GLUCOSE - BMC/JMC ONLY: GLUCOSE: 449 mg/dL (ref 70–110)

## 2017-05-21 LAB — B-TYPE NATRIURETIC PEPTIDE: BNP: 299 pg/mL — ABNORMAL HIGH (ref 0–100)

## 2017-05-21 MED ORDER — SODIUM CHLORIDE 0.9 % (FLUSH) INJECTION SYRINGE
10.0000 mL | INJECTION | INTRAMUSCULAR | Status: DC | PRN
Start: 2017-05-21 — End: 2017-05-24

## 2017-05-21 MED ORDER — ASPIRIN 81 MG CHEWABLE TABLET
81.0000 mg | CHEWABLE_TABLET | Freq: Every day | ORAL | Status: DC
Start: 2017-05-21 — End: 2017-05-24
  Administered 2017-05-21 – 2017-05-24 (×4): 81 mg via ORAL
  Filled 2017-05-21 (×4): qty 1

## 2017-05-21 MED ORDER — BUDESONIDE-FORMOTEROL HFA 160 MCG-4.5 MCG/ACTUATION AEROSOL INHALER
1.00 | INHALATION_SPRAY | Freq: Two times a day (BID) | RESPIRATORY_TRACT | Status: DC
Start: 2017-05-21 — End: 2017-05-24
  Administered 2017-05-21 – 2017-05-24 (×6): 1 via RESPIRATORY_TRACT
  Filled 2017-05-21: qty 6

## 2017-05-21 MED ORDER — NITROGLYCERIN 0.4 MG SUBLINGUAL TABLET
0.4000 mg | SUBLINGUAL_TABLET | SUBLINGUAL | Status: DC | PRN
Start: 2017-05-21 — End: 2017-05-24

## 2017-05-21 MED ORDER — PIOGLITAZONE 15 MG TABLET
15.0000 mg | ORAL_TABLET | Freq: Every day | ORAL | Status: DC
Start: 2017-05-21 — End: 2017-05-24
  Administered 2017-05-21 – 2017-05-24 (×5): 15 mg via ORAL
  Filled 2017-05-21 (×5): qty 1

## 2017-05-21 MED ORDER — METHYLPREDNISOLONE SOD SUCCINATE 40 MG/ML SOLUTION FOR INJ. WRAPPER
20.00 mg | Freq: Three times a day (TID) | INTRAMUSCULAR | Status: DC
Start: 2017-05-21 — End: 2017-05-22
  Administered 2017-05-22: 20 mg via INTRAMUSCULAR
  Filled 2017-05-21 (×2): qty 1

## 2017-05-21 MED ORDER — AMLODIPINE 5 MG TABLET
10.0000 mg | ORAL_TABLET | Freq: Every day | ORAL | Status: DC
Start: 2017-05-21 — End: 2017-05-24
  Administered 2017-05-21 – 2017-05-24 (×5): 10 mg via ORAL
  Filled 2017-05-21 (×4): qty 2

## 2017-05-21 MED ORDER — CEFTRIAXONE 1 GRAM/50 ML IN DEXTROSE (ISO-OSMOT) DUPLEX
1.0000 g | INJECTION | INTRAVENOUS | Status: AC
Start: 2017-05-21 — End: 2017-05-21
  Administered 2017-05-21: 1 g via INTRAVENOUS
  Administered 2017-05-21: 0 g via INTRAVENOUS
  Filled 2017-05-21: qty 50

## 2017-05-21 MED ORDER — MAGNESIUM SULFATE 2 GRAM/50 ML (4 %) IN WATER INTRAVENOUS PIGGYBACK
2.00 g | INJECTION | Freq: Once | INTRAVENOUS | Status: AC
Start: 2017-05-21 — End: 2017-05-21

## 2017-05-21 MED ORDER — INSULIN GLARGINE (U-100) 100 UNIT/ML SUBCUTANEOUS SOLUTION
20.0000 [IU] | Freq: Every day | SUBCUTANEOUS | Status: DC
Start: 2017-05-21 — End: 2017-05-24
  Administered 2017-05-21 – 2017-05-24 (×4): 20 [IU] via SUBCUTANEOUS
  Filled 2017-05-21: qty 200

## 2017-05-21 MED ORDER — MORPHINE 4 MG/ML INTRAVENOUS SOLUTION
4.0000 mg | INTRAVENOUS | Status: DC | PRN
Start: 2017-05-21 — End: 2017-05-24

## 2017-05-21 MED ORDER — ALBUTEROL SULFATE 2.5 MG/0.5 ML (0.5 %) NEB SOLUTION - CHI
10.00 mg/h | INHALATION_SOLUTION | RESPIRATORY_TRACT | Status: DC
Start: 2017-05-21 — End: 2017-05-24
  Administered 2017-05-21: 0 mg/h via RESPIRATORY_TRACT
  Administered 2017-05-21: 10 mg/h via RESPIRATORY_TRACT
  Filled 2017-05-21: qty 4

## 2017-05-21 MED ORDER — ACETAMINOPHEN 325 MG TABLET
650.0000 mg | ORAL_TABLET | Freq: Four times a day (QID) | ORAL | Status: DC | PRN
Start: 2017-05-21 — End: 2017-05-24

## 2017-05-21 MED ORDER — SODIUM CHLORIDE 0.9 % (FLUSH) INJECTION SYRINGE
10.0000 mL | INJECTION | Freq: Three times a day (TID) | INTRAMUSCULAR | Status: DC
Start: 2017-05-21 — End: 2017-05-24
  Administered 2017-05-21 – 2017-05-24 (×8): 10 mL via INTRAVENOUS

## 2017-05-21 MED ORDER — MAGNESIUM SULFATE 2 GRAM/50 ML (4 %) IN WATER INTRAVENOUS PIGGYBACK
INJECTION | INTRAVENOUS | Status: AC
Start: 2017-05-21 — End: 2017-05-21
  Administered 2017-05-21: 2 g via INTRAVENOUS
  Administered 2017-05-21: 0 g via INTRAVENOUS
  Filled 2017-05-21: qty 50

## 2017-05-21 MED ORDER — ALBUTEROL SULFATE CONCENTRATE 2.5 MG/0.5 ML SOLUTION FOR NEBULIZATION
2.50 mg | INHALATION_SOLUTION | RESPIRATORY_TRACT | Status: DC
Start: 2017-05-21 — End: 2017-05-24
  Administered 2017-05-21 (×2): 2.5 mg via RESPIRATORY_TRACT
  Administered 2017-05-21 (×2): 0 mg via RESPIRATORY_TRACT
  Administered 2017-05-22 – 2017-05-24 (×14): 2.5 mg via RESPIRATORY_TRACT
  Administered 2017-05-24: 0 mg via RESPIRATORY_TRACT
  Filled 2017-05-21 (×15): qty 1

## 2017-05-21 MED ORDER — ENOXAPARIN 40 MG/0.4 ML SUB-Q SYRINGE - EAST
40.0000 mg | INJECTION | Freq: Every day | SUBCUTANEOUS | Status: DC
Start: 2017-05-21 — End: 2017-05-24
  Administered 2017-05-21 – 2017-05-24 (×4): 40 mg via SUBCUTANEOUS
  Filled 2017-05-21 (×4): qty 0.4

## 2017-05-21 MED ORDER — AZITHROMYCIN 500 MG INTRAVENOUS SOLUTION
500.00 mg | INTRAVENOUS | Status: AC
Start: 2017-05-21 — End: 2017-05-21
  Administered 2017-05-21 (×2): 500 mg via INTRAVENOUS
  Administered 2017-05-21: 0 mg via INTRAVENOUS
  Filled 2017-05-21: qty 5

## 2017-05-21 MED ORDER — IPRATROPIUM BROMIDE 0.02 % SOLUTION FOR INHALATION
0.50 mg | RESPIRATORY_TRACT | Status: DC
Start: 2017-05-21 — End: 2017-05-24
  Administered 2017-05-21 (×3): 0.5 mg via RESPIRATORY_TRACT
  Administered 2017-05-21: 0 mg via RESPIRATORY_TRACT
  Administered 2017-05-22 – 2017-05-23 (×12): 0.5 mg via RESPIRATORY_TRACT
  Administered 2017-05-24: 0 mg via RESPIRATORY_TRACT
  Administered 2017-05-24 (×2): 0.5 mg via RESPIRATORY_TRACT
  Filled 2017-05-21 (×15): qty 1

## 2017-05-21 MED ORDER — INSULIN LISPRO 100 UNIT/ML SUB-Q - CHARGE BY DOSE
15.00 [IU] | SUBCUTANEOUS | Status: AC
Start: 2017-05-21 — End: 2017-05-21
  Administered 2017-05-21: 15 [IU] via SUBCUTANEOUS
  Filled 2017-05-21: qty 45

## 2017-05-21 MED ORDER — DEXTROSE 50 % IN WATER (D50W) INTRAVENOUS SYRINGE
12.5000 g | INJECTION | INTRAVENOUS | Status: DC | PRN
Start: 2017-05-21 — End: 2017-05-24

## 2017-05-21 MED ORDER — INSULIN LISPRO (U-100) 100 UNIT/ML SUBCUTANEOUS SOLUTION
8.00 [IU] | SUBCUTANEOUS | Status: AC
Start: 2017-05-21 — End: 2017-05-21
  Administered 2017-05-21: 8 [IU] via SUBCUTANEOUS

## 2017-05-21 MED ORDER — ATORVASTATIN 40 MG TABLET
40.0000 mg | ORAL_TABLET | Freq: Every day | ORAL | Status: DC
Start: 2017-05-21 — End: 2017-05-24
  Administered 2017-05-21 – 2017-05-24 (×5): 40 mg via ORAL
  Filled 2017-05-21 (×4): qty 1

## 2017-05-21 MED ORDER — INSULIN LISPRO 100 UNIT/ML INJECTION SSIP - CITY
Freq: Four times a day (QID) | SUBCUTANEOUS | Status: DC
Start: 2017-05-21 — End: 2017-05-24
  Administered 2017-05-21 (×2): 12 [IU] via SUBCUTANEOUS
  Administered 2017-05-22 (×2): 7 [IU] via SUBCUTANEOUS
  Administered 2017-05-23: 10 [IU] via SUBCUTANEOUS
  Administered 2017-05-23: 0 [IU] via SUBCUTANEOUS
  Administered 2017-05-24: 2 [IU] via SUBCUTANEOUS
  Filled 2017-05-21: qty 300

## 2017-05-21 MED ORDER — METHYLPREDNISOLONE SOD SUCC 125 MG SOLUTION FOR INJECTION WRAPPER
125.00 mg | INTRAVENOUS | Status: AC
Start: 2017-05-21 — End: 2017-05-21
  Administered 2017-05-21: 125 mg via INTRAVENOUS
  Filled 2017-05-21: qty 2

## 2017-05-21 MED ORDER — RANITIDINE 150 MG TABLET
150.00 mg | ORAL_TABLET | Freq: Two times a day (BID) | ORAL | Status: DC
Start: 2017-05-21 — End: 2017-05-24
  Administered 2017-05-21 – 2017-05-24 (×6): 150 mg via ORAL
  Filled 2017-05-21 (×6): qty 1

## 2017-05-21 MED ADMIN — sodium chloride 0.9 % intravenous solution: RESPIRATORY_TRACT | @ 23:00:00 | NDC 00338004904

## 2017-05-21 NOTE — ED Triage Notes (Signed)
Per EMS, pt c/o sob/distress since yesterday. Upon EMS arrival, patient was on home O2 at 3 liters, sats 76%. Patient given one duoneb by EMS without relief. Patient arrives on CPAP. ETCO2 47.

## 2017-05-21 NOTE — Nurses Notes (Signed)
Received shift report from H. White. RN and assumed care of Pt.

## 2017-05-21 NOTE — Care Plan (Signed)
Problem: Adult Inpatient Plan of Care  Goal: Plan of Care Review  Outcome: Ongoing (see interventions/notes)  Goal: Patient-Specific Goal (Individualization)  Outcome: Ongoing (see interventions/notes)  Goal: Absence of Hospital-Acquired Illness or Injury  Outcome: Ongoing (see interventions/notes)  Goal: Optimal Comfort and Wellbeing  Outcome: Ongoing (see interventions/notes)  Goal: Rounds/Family Conference  Outcome: Ongoing (see interventions/notes)     Problem: Depression  Goal: Improved Mood  Outcome: Ongoing (see interventions/notes)     Problem: Fall Injury Risk  Goal: Absence of Fall and Fall-Related Injury  Outcome: Ongoing (see interventions/notes)

## 2017-05-21 NOTE — Nurses Notes (Addendum)
Administered additional  1x order for 8 units Humalog insulin.

## 2017-05-21 NOTE — ED Nurses Note (Signed)
EKG completed and shown to Dr. Williamson.

## 2017-05-21 NOTE — ED Nurses Note (Signed)
Pt arrives to assigned room and is undressed for exam and placed in hospital gown.  Pt in hospital stretcher with side rails up in lowest position and call bell within reach.  Pt oriented to room, toileting, NPO status, ambulatory status, proper call bell use.  Pt also offered comfort measures (ie. Repositioning, blankets, etc.) Pt on automatic blood pressure device with appropriately sized cuff. Pt on continuous SPO2 monitoring. Pt placed on cardiac monitor with alarms set.       Pt states she recently saw her PCP Dr Sabra Heck, but he failed to refill her BP and Diabetic medications and she is out of them.  Pt reports SOB for several days.  Pt smells of tobacco product.  Pt arrives is moderate to severe respiratory distress.  Pt is only able to speak in one word phrases at a time initially on EMS CPAP.  EMS report hypoxia 76% on her home O2 at 3 lpm.  Pt reports hx of HTN with her BP running very high at baseline.  Pt reports BP's of 200's/100's to be her normal.  Pt denies hx of CHF; however, pt has +1/2 BLE pitting edema noted.  Pt's lung sounds are very diminished upon arrival to the ED.  Pt has accessory muscle use with respirations with shallow and rapid chest rise and fall.  Pt has S1, S2 heart sounds at PMAT.  No murmur/click/rub.  Pt has edema noted.  Pt has +2 pulses all extremities. Skin pink warm and dry.  Patient alert and oriented x 3.  Glasgow Coma Scale=15.

## 2017-05-21 NOTE — Nurses Notes (Signed)
Pt to room 631 from ED. Pt is alert and responsive. Continues on 4 L O2 via NC. Tele 24- ST. VSS. Pt able to ambulate in room with stand by assist without difficulty. Call bell within reach. Will continue to monitor.

## 2017-05-21 NOTE — ED Provider Notes (Signed)
Gevena Mart, MD  [facility]  Emergency Department Visit Note    Date:  05/21/2017  Primary care provider:  Hubbard Robinson, DO  Means of arrival:  ambulance  History obtained from: EMS personnel  History limited by: Respiratory distress    Chief Complaint:  Respiratory Distress    HISTORY OF PRESENT ILLNESS     [name], date of birth 1955/05/22, is a 62 y.o. female who presents to the Emergency Department with Respiratory distress.     Per EMS, the patient had shortness of breath last night. She has a history of COPD and type II diabetes. She uses home duonebulizers and is on 3L of oxygen. She had one nebulizer last night without relief. She has been on Cpaps before, but has never required intubation. She was sating at 76%, but now 90% while on the Cpap. Her blood pressure was 200/90, but that is her baseline.     Caveat: Full history of present illness is unobtainable from patient secondary to respiratory distress.    REVIEW OF SYSTEMS     Caveat: Full review of systems is unobtainable from patient secondary to respiratory distress.     PATIENT HISTORY     Past Medical History:  Past Medical History:   Diagnosis Date   . COPD (chronic obstructive pulmonary disease) (CMS HCC)    . Degenerative joint disease involving multiple joints    . Diabetes mellitus (CMS [place])    . HTN (hypertension)    . Smoker    . 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 Wears glasses        Past Surgical History:  Past Surgical History:   Procedure Laterality Date   . Hx carpal tunnel release         Family History:  Family Medical History:     Problem Relation (Age of Onset)    COPD Mother    Coronary Artery Disease Father    Diabetes Mother, Sister, Brother, Paternal Grandmother          Social History:  Social History     Tobacco Use   . Smoking status: Current Every Day Smoker     Packs/day: 1.50     Years: 20.00     Pack years: 30.00     Types: Cigarettes   . Smokeless tobacco: Never Used   Substance  Use Topics   . Alcohol use: No   . Drug use: No     Social History     Substance and Sexual Activity   Drug Use No       Medications:  Current Outpatient Medications   Medication Sig   . albuterol (PROVENTIL) 2.5 mg/0.5 mL Inhalation Solution for Nebulization 2.5 mg by Nebulization route Four times a day   . amLODIPine (NORVASC) 10 mg Oral Tablet TAKE 1 TABLET BY MOUTH ONCE DAILY   . aspirin 81 mg Oral Tablet, Chewable Take 1 Tab (81 mg total) by mouth Once a day   . atorvastatin (LIPITOR) 40 mg Oral Tablet Take 1 Tab (40 mg total) by mouth Once a day   . ATROVENT HFA 17 mcg/actuation Inhalation HFA Aerosol Inhaler oral inhaler INHALE 2 PUFFS BY MOUTH 4 TIMES DAILY   . fluticasone-salmeterol (ADVAIR) 500-50 mcg/dose Inhalation Disk with Device oral diskus inhaler Take 1 INHALATION by inhalation Twice daily for 30 days   . fluticasone-salmeterol (ADVAIR) 500-50 mcg/dose Inhalation Disk with Device oral  diskus inhaler Take 1 INHALATION by inhalation Twice daily   . gabapentin (NEURONTIN) 300 mg Oral Capsule TAKE 1 CAPSULE BY MOUTH THREE TIMES DAILY FOR 30 DAYS   . insulin glargine (LANTUS SOLOSTAR U-100 INSULIN) 100 unit/mL Subcutaneous Insulin Pen 40 Units by Subcutaneous route Every night   . JANUVIA 100 mg Oral Tablet TAKE 1 TABLET BY MOUTH ONCE DAILY   . lisinopril (PRINIVIL) 40 mg Oral Tablet TAKE 1 TABLET BY MOUTH ONCE DAILY   . pioglitazone (ACTOS) 15 mg Oral Tablet Take 1 Tab (15 mg total) by mouth Once a day for 90 days       Allergies:  Allergies   Allergen Reactions   . Codeine  Other Adverse Reaction (Add comment)     Sick to stomach   . Hydrocodone Nausea/ Vomiting   . Metformin Diarrhea   . Oxycodone Nausea/ Vomiting       PHYSICAL EXAM     Vitals:  Filed Vitals:    05/21/17 0847   Pulse: (!) 132   Resp: (!) 29   SpO2: 91%       Pulse ox  91% on Venti Mask interpreted by me as: Abnormal    Constitutional: The patient is in moderate, respiratory distress and tachypneic.   HENT: No facial injuries or  swelling. Bipap mask in place.   Head: Atraumatic.   Nose: No rhinorrhea or epistaxis.  Mouth/Throat: Lips and mucosa well hydrated.   Eyes: Extra Ocular Movements are normal. Pupils are equal, round, and reactive to light.   Neck: No thyroid enlargement.  Cardiovascular: Tachycardic rate and regular rhythm.    Pulmonary/Chest: Moderate respiratory distress. Poor air movement. Rhonchi in both lung fields with questions of scattered crackles.    Abdominal: Bowel sounds are normal. There is no tenderness.   Extremities: normal range of motion, no edema. No deformity.  Neurological: Alert with sensory motor intact.   Skin: No rash noted. No cyanosis. No discoloration.  Psychiatric: Normal mood and affect. Behavior is normal.       DIAGNOSTIC STUDIES     Labs:    Results for orders placed or performed during the hospital encounter of 05/21/17   COMPREHENSIVE METABOLIC PROFILE - [facility] ONLY   Result Value Ref Range    SODIUM 133 (L) 136 - 145 mmol/L    POTASSIUM 4.1 3.4 - 5.1 mmol/L    CHLORIDE 97 (L) 101 - 111 mmol/L    CO2 TOTAL 30 22 - 32 mmol/L    ANION GAP 6 3 - 11 mmol/L    BUN 4 (L) 6 - 20 mg/dL    CREATININE 0.53 0.44 - 1.00 mg/dL    BUN/CREA RATIO 8 6 - 22    ESTIMATED GFR >60 >60 mL/min/1.72m^2    ALBUMIN 3.6 3.5 - 5.0 g/dL    CALCIUM 8.8 8.6 - 10.3 mg/dL    GLUCOSE 436 (HH) 70 - 110 mg/dL    ALKALINE PHOSPHATASE 86 38 - 126 U/L    ALT (SGPT) 20 14 - 54 U/L    AST (SGOT) 22 15 - 41 U/L    BILIRUBIN TOTAL 0.8 0.3 - 1.2 mg/dL    PROTEIN TOTAL 7.4 6.4 - 8.3 g/dL    ALBUMIN/GLOBULIN RATIO 0.9 0.8 - 2.0   TROPONIN-I   Result Value Ref Range    TROPONIN I <0.03 <=0.06 ng/mL   CBC WITH DIFF   Result Value Ref Range    WBC 15.9 (H) 4.0 - 11.0 x10^3/uL  RBC 5.04 4.00 - 5.10 x10^6/uL    HGB 15.2 12.0 - 15.5 g/dL    HCT 47.1 (H) 36.0 - 45.0 %    MCV 93.6 82.0 - 97.0 fL    MCH 30.1 27.5 - 33.2 pg    MCHC 32.2 32.0 - 36.0 g/dL    RDW 14.4 11.0 - 16.0 %    PLATELETS 330 150 - 450 x10^3/uL    MPV 8.2 7.4 - 10.5 fL     NEUTROPHIL % 67 43 - 76 %    LYMPHOCYTE % 24 15 - 43 %    MONOCYTE % 8 5 - 12 %    EOSINOPHIL % 0 0 - 5 %    BASOPHIL % 1 0 - 3 %    NEUTROPHIL # 10.60 (H) 1.50 - 6.50 x10^3/uL    LYMPHOCYTE # 3.80 1.00 - 4.80 x10^3/uL    MONOCYTE # 1.30 (H) 0.20 - 0.90 x10^3/uL    EOSINOPHIL # 0.00 0.00 - 0.50 x10^3/uL    BASOPHIL # 0.10 0.00 - 0.10 x10^3/uL   B-Type Natriuretic Peptide (BNP)   Result Value Ref Range    BNP 299 (H) 0 - 100 pg/mL   Lactic Acid   Result Value Ref Range    LACTIC ACID 1.4 0.5 - 2.0 mmol/L   POC FINGERSTICK GLUCOSE - [facility] (RESULTS)   Result Value Ref Range    GLUCOSE, POC 393 (H) 60 - 100 mg/dl   ARTERIAL BLOOD GAS (RESULTS)- [facility]   Result Value Ref Range    PH 7.33 (L) 7.35 - 7.45    PCO2 56.00 (H) 35.00 - 45.00 mm/Hg    PO2 68 (L) 83 - 108 mm/Hg    BICARBONATE 29.6 (H) 21.0 - 28.0 mmol/L    BASE EXCESS 1.9 -2.0 - 3.0 mmol/L    O2 SATURATION 91.3 (L) 93.0 - 98.0 %    CO2, TOTAL 31.3 (H) 22.0 - 29.0 mm/Hg    SAMPLE TYPE Arterial     DRAW SITE LRadial     ALLEN TEST Positive     DELIVERY SYSTEM BiPAP     %FIO2 40 %    RESPIRATORY RATE 16      Labs reviewed and interpreted by me.   BLOOD CULTURE - [place] - results pending at time of disposition  BLOOD CULTURE - [place] - results pending at time of disposition    Radiology:    XR CHEST AP PORTABLE   Final Result      Subtle infiltrates in the lower lungs bilaterally            Radiologist location ID: [place] imaging interpreted by radiologist and independently reviewed by me.    EKG:  12 lead EKG interpreted by me shows sinus tachycardia, rate of 125 bpm, normal axis, normal intervals, no ST elevations or Q waves.     ED PROGRESS NOTE / MEDICAL DECISION MAKING     Old records reviewed by me:  I reviewed prior available records and pertinent past medical history.      Orders Placed This Encounter   . ADULT ROUTINE BLOOD CULTURE, SET OF 2 BOTTLES (BACTERIA AND YEAST)   . ADULT ROUTINE BLOOD CULTURE, SET OF 2 BOTTLES  (BACTERIA AND YEAST)   . XR CHEST AP PORTABLE   . COMPREHENSIVE METABOLIC PROFILE - [facility] ONLY   . TROPONIN-I   . CBC WITH DIFF   . B-Type  Natriuretic Peptide (BNP)   . Lactic Acid   . OXYGEN - NASAL CANNULA   . BIPAP CONTINUOUS EXCEPT MEDS/MEALS   . ECG 12-LEAD   . Arterial Blood Gas - Perform POC Arterial Blood Gas   . INSERT & MAINTAIN PERIPHERAL IV ACCESS   . PATIENT CLASS/LEVEL OF CARE DESIGNATION - [place]   . magnesium sulfate 2 G in SW 50 mL premix IVPB   . albuterol (VENTOLIN) continuous nebulization   . methylPREDNISolone sod succ (SOLU-MEDROL) 125 mg/2 mL injection   . cefTRIAXone (ROCEPHIN) 1 g in iso-osmotic 50 mL duplex IVPB   . azithromycin (ZITHROMAX) 500 mg in NS 250 mL IVPB   . insulin lispro (HUMALOG) 100 units/mL injection          08:38: Code critical called at this time, ETA 5 minutes.     08:44: The patient arrived via EMS and I was immediately present at the bedside with the nursing staff and respiratory team.     08:48: Initial evaluation completed at this time. The patient is in moderate distress, but able to be aided by the use of bipap. I will continue the bipap and proceed with COPD exacerbation evaluation, but also investigate other causes of her respiratory complaints including CHF.     09:01: CXR reviewed by me showed some infiltrate pattern on the right in relation to the previous, but I will compare to the Radiology read when it results. In the meantime, I will treat her with Rocephin and Zithromax.     09:10: On recheck, the patient is tolerating the bipap well. I explained the results of the diagnostic studies and my plan to have her admitted. She understood and is agreeable with the treatment plan at this time. She was able to provide more history, reporting she had shortness of breath for the past 3-4 days and she affirms to smoking.    09:25: Per nurse, the lab called with a critical glucose of 436. The patient is known to be non-compliant with her medications, but I will  treat her with a dose of Humalog here.     09:29: CXR showed subtle infiltrates in the lower lungs bilaterally which she is already being treated for here.     09:38: Hospitalist paged at this time.    09:54: I discussed the patient's case and above findings at length with Dr. Marta Antu [facility]) who is making arrangements for admission at this time.     MIPS     Not applicable     OPIATE PRESCRIPTION      Not applicable    CORE MEASURES     Not applicable    CRITICAL CARE TIME     Total Critical Care Time for this patient is 45 minutes and it is exclusive of procedural time.    PRE-DISPOSITION VITALS      Pre-Disposition Vitals:  Filed Vitals:    05/21/17 0855 05/21/17 0900 05/21/17 0915 05/21/17 0930   BP: (!) 182/92 (!) 175/75 (!) 150/58 133/73   Pulse:  (!) 122 (!) 109 (!) 102   Resp:  (!) 30 (!) 25 (!) 21   Temp:  37.3 C (99.1 F)     SpO2:  92% 93% 95%     CLINICAL IMPRESSION     Encounter Diagnoses   Name Primary?   Marland Kitchen COPD exacerbation (CMS HCC) Yes   . Hypoxia      DISPOSITION/PLAN     Admitted  Condition at Disposition: [place], [place] scribed for Gevena Mart, MD on 05/21/2017 at 8:46 AM.     Documentation assistance provided for Gevena Mart, MD  by Trina Ao, [place]. Information recorded by the scribe was done at my direction and has been reviewed and validated by me Gevena Mart, MD.

## 2017-05-21 NOTE — Nurses Notes (Signed)
Blood sugar 449. MD notified. New orders placed.

## 2017-05-21 NOTE — Nurses Notes (Signed)
Pt's blood sugar- 367. MD notified. Ordered to give 12 units sliding scale. Will recheck before dinner.

## 2017-05-21 NOTE — H&P (Signed)
**Note Melinda-Identified via Obfuscation** [name]  [name],  [id number]    Admission H&P    Date of Service:  05/21/2017  [name], [name], 62 y.o. female  Date of Admission:  05/21/2017  Date of Birth:  December 20, 1955  PCP: Melinda Robinson, DO    Information Obtained from: patient and health care provider     Chief Complaint:  Respiratory distress.     HPI: [name] is a 62 y.o., White female who presents with  CHIEF COMPLAINT: Acute respiratory distress.  As her presenting complaint.  The patient does have known history of for chronic obstructive pulmonary disease and type 2 diabetes mellitus.  Her breathing has been getting worse from the last night, but she was having trouble breathing for about 2 weeks.  She does use home albuterol and Atrovent nebulizer treatments and she is home oxygen dependent at 3 liters of oxygen per minute by nasal cannula.  She did have a nebulizer treatment last night without any relief.  She has been on CPAP before for treatment of her condition, but never required intubation.  Her oxygen saturation was 76% and when emergency medical service put her on CPAP, it went up to 90%.  Her blood pressure was 200/90 and this is chronically elevated.  She was brought to Emergency Department for further evaluation.    REVIEW OF SYSTEMS:  Constitutionally:  Denies any fevers.  She does not recall having fevers, but says subjective chills.  Head and ENT:  Pertinently negative.  Cardiovascularly:  Has some shortness of breath, but no chest pain or discomfort.  Respiratory:  She was wheezing and has trouble breathing, which was not improving with nebulizer treatments.  She does have cough, but with no expectoration.  Gastrointestinal:  No nausea, vomiting.  No hematochezia or melena.        l other systems were reviewed and are negative.          PAST MEDICAL:    Past Medical History:   Diagnosis Date   . COPD (chronic obstructive pulmonary disease) (CMS HCC)    . Degenerative joint disease involving multiple  joints    . Diabetes mellitus (CMS [place])    . HTN (hypertension)    . Smoker    . 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 Wears glasses         Past Surgical History:   Procedure Laterality Date   . HX CARPAL TUNNEL RELEASE              Medications Prior to Admission     Prescriptions    albuterol (PROVENTIL) 2.5 mg/0.5 mL Inhalation Solution for Nebulization    2.5 mg by Nebulization route Four times a day    amLODIPine (NORVASC) 10 mg Oral Tablet    TAKE 1 TABLET BY MOUTH ONCE DAILY    aspirin 81 mg Oral Tablet, Chewable    Take 1 Tab (81 mg total) by mouth Once a day    atorvastatin (LIPITOR) 40 mg Oral Tablet    Take 1 Tab (40 mg total) by mouth Once a day    ATROVENT HFA 17 mcg/actuation Inhalation HFA Aerosol Inhaler oral inhaler    INHALE 2 PUFFS BY MOUTH 4 TIMES DAILY    fluticasone-salmeterol (ADVAIR) 500-50 mcg/dose Inhalation Disk with Device oral diskus inhaler    Take 1 INHALATION by inhalation Twice daily for 30 days    fluticasone-salmeterol (ADVAIR) 500-50 mcg/dose Inhalation  Disk with Device oral diskus inhaler    Take 1 INHALATION by inhalation Twice daily    pioglitazone (ACTOS) 15 mg Oral Tablet    Take 1 Tab (15 mg total) by mouth Once a day for 90 days        Allergies   Allergen Reactions   . Codeine  Other Adverse Reaction (Add comment)     Sick to stomach   . Hydrocodone Nausea/ Vomiting   . Metformin Diarrhea   . Oxycodone Nausea/ Vomiting       Family History  Family Medical History:     Problem Relation (Age of Onset)    COPD Mother    Coronary Artery Disease Father    Diabetes Mother, Sister, Brother, Paternal Grandmother              Social History  Social History     Socioeconomic History   . Marital status: Married     Spouse name: Not on file   . Number of children: Not on file   . Years of education: Not on file   . Highest education level: Not on file   Occupational History   . Not on file   Social Needs   . Financial resource strain: Not on file   . Food insecurity:        Worry: Not on file     Inability: Not on file   . Transportation needs:     Medical: Not on file     Non-medical: Not on file   Tobacco Use   . Smoking status: Current Every Day Smoker     Packs/day: 1.50     Years: 20.00     Pack years: 30.00     Types: Cigarettes   . Smokeless tobacco: Never Used   Substance and Sexual Activity   . Alcohol use: No   . Drug use: No   . Sexual activity: Not on file   Lifestyle   . Physical activity:     Days per week: Not on file     Minutes per session: Not on file   . Stress: Not on file   Relationships   . Social connections:     Talks on phone: Not on file     Gets together: Not on file     Attends religious service: Not on file     Active member of club or organization: Not on file     Attends meetings of clubs or organizations: Not on file     Relationship status: Not on file   . Intimate partner violence:     Fear of current or ex partner: Not on file     Emotionally abused: Not on file     Physically abused: Not on file     Forced sexual activity: Not on file   Other Topics Concern   . Abuse/Domestic Violence Not Asked   . Breast Self Exam Not Asked   . Caffeine Concern Not Asked   . Calcium intake adequate Not Asked   . Computer Use Not Asked   . Drives Not Asked   . Exercise Concern Not Asked   . Helmet Use Not Asked   . Seat Belt Not Asked   . Special Diet Not Asked   . Sunscreen used Not Asked   . Uses Cane Not Asked   . Uses walker Not Asked   . Uses wheelchair Not Asked   . Right hand dominant Not  Asked   . Left hand dominant Not Asked   . Ambidextrous Not Asked   . Shift Work Not Asked   . Unusual Sleep-Wake Schedule Not Asked   Social History Narrative   . Not on file           Examination:  Temperature: 37.3 C (99.1 F) Heart Rate: (!) 102 BP (Non-Invasive): 133/73   Respiratory Rate: (!) 21 SpO2: 95 %           GENERAL: Patient is alert, awake and oriented X 3. In no cardio respiratory distress. Has a large more on her upper eyelid on right side. She is obese  and is chronically ill appearing.   HEENT: PERRLA, EOMI.   NECK: Supple, NO JVD.   HEART: S1+, S2+, rate controlled and rhythm regular. No murmurs appreciated.   LUNGS: Bilateral breath sounds coarse, has ronchi and expiratory wheezing.   ABDOMEN: Soft, NT, ND with NABS.   EXTREMITIES: No edema, pedal pulses palpable.   NEURO: Motor exam is non focal.   SKIN: No rashes or bruises.   PSYCH: Patient is awake, oriented X 3,  with no signs of depression.        Labs:    Results for orders placed or performed during the [name] encounter of 05/21/17 (from the past 12 hour(s))   POC FINGERSTICK GLUCOSE - [facility] (RESULTS)   Result Value Ref Range    GLUCOSE, POC 393 (H) 60 - 100 mg/dl   COMPREHENSIVE METABOLIC PROFILE - [facility] ONLY   Result Value Ref Range    SODIUM 133 (L) 136 - 145 mmol/L    POTASSIUM 4.1 3.4 - 5.1 mmol/L    CHLORIDE 97 (L) 101 - 111 mmol/L    CO2 TOTAL 30 22 - 32 mmol/L    ANION GAP 6 3 - 11 mmol/L    BUN 4 (L) 6 - 20 mg/dL    CREATININE 0.53 0.44 - 1.00 mg/dL    BUN/CREA RATIO 8 6 - 22    ESTIMATED GFR >60 >60 mL/min/1.59m^2    ALBUMIN 3.6 3.5 - 5.0 g/dL    CALCIUM 8.8 8.6 - 10.3 mg/dL    GLUCOSE 436 (HH) 70 - 110 mg/dL    ALKALINE PHOSPHATASE 86 38 - 126 U/L    ALT (SGPT) 20 14 - 54 U/L    AST (SGOT) 22 15 - 41 U/L    BILIRUBIN TOTAL 0.8 0.3 - 1.2 mg/dL    PROTEIN TOTAL 7.4 6.4 - 8.3 g/dL    ALBUMIN/GLOBULIN RATIO 0.9 0.8 - 2.0   TROPONIN-I   Result Value Ref Range    TROPONIN I <0.03 <=0.06 ng/mL   CBC WITH DIFF   Result Value Ref Range    WBC 15.9 (H) 4.0 - 11.0 x10^3/uL    RBC 5.04 4.00 - 5.10 x10^6/uL    HGB 15.2 12.0 - 15.5 g/dL    HCT 47.1 (H) 36.0 - 45.0 %    MCV 93.6 82.0 - 97.0 fL    MCH 30.1 27.5 - 33.2 pg    MCHC 32.2 32.0 - 36.0 g/dL    RDW 14.4 11.0 - 16.0 %    PLATELETS 330 150 - 450 x10^3/uL    MPV 8.2 7.4 - 10.5 fL    NEUTROPHIL % 67 43 - 76 %    LYMPHOCYTE % 24 15 - 43 %    MONOCYTE % 8 5 - 12 %    EOSINOPHIL % 0 0 - 5 %  BASOPHIL % 1 0 - 3 %    NEUTROPHIL # 10.60 (H) 1.50 - 6.50  x10^3/uL    LYMPHOCYTE # 3.80 1.00 - 4.80 x10^3/uL    MONOCYTE # 1.30 (H) 0.20 - 0.90 x10^3/uL    EOSINOPHIL # 0.00 0.00 - 0.50 x10^3/uL    BASOPHIL # 0.10 0.00 - 0.10 x10^3/uL   B-Type Natriuretic Peptide (BNP)   Result Value Ref Range    BNP 299 (H) 0 - 100 pg/mL   ARTERIAL BLOOD GAS (RESULTS)- [facility]   Result Value Ref Range    PH 7.33 (L) 7.35 - 7.45    PCO2 56.00 (H) 35.00 - 45.00 mm/Hg    PO2 68 (L) 83 - 108 mm/Hg    BICARBONATE 29.6 (H) 21.0 - 28.0 mmol/L    BASE EXCESS 1.9 -2.0 - 3.0 mmol/L    O2 SATURATION 91.3 (L) 93.0 - 98.0 %    CO2, TOTAL 31.3 (H) 22.0 - 29.0 mm/Hg    SAMPLE TYPE Arterial     DRAW SITE LRadial     ALLEN TEST Positive     DELIVERY SYSTEM BiPAP     %FIO2 40 %    RESPIRATORY RATE 16        Imaging Studies:    Results for orders placed or performed during the [name] encounter of 05/21/17 (from the past 24 hour(s))   XR CHEST AP PORTABLE     Status: None    Narrative    RADIOLOGIST: Lars Masson, MD    EXAMINATION: XR CHEST AP PORTABLE     EXAM DATE/TIME: 05/21/2017 8:58 AM    CLINICAL INDICATION: SOB    COMPARISON: None.    FINDINGS:     There are subtle infiltrates in the lower lungs bilaterally. There are no  pleural effusions.      Impression    Subtle infiltrates in the lower lungs bilaterally        Radiologist location ID: [id number]         DNR Status:  Prior    Assessment/Plan:   Active [name] Problems   (*Primary Problem)    Diagnosis   . Acute respiratory failure (CMS HCC)       DVT/PE Prophylaxis: Enoxaparin     1. Acute respiratory failure with mild hypercapnia and hypoxia.  Patient has acute worsening of her hypoxia on her chronic underlying hypoxia.  She was placed on BiPAP in the emergency department and was treated with intravenous Solu-Medrol, empiric antibiotics and albuterol and Atrovent treatments after which she felt better.   2. Acute chronic obstructive pulmonary disease exacerbation.  Continue albuterol and Atrovent nebs.  Continue Symbicort off with long-acting  beta agonists and inhaled steroids.  Also continue systemic steroids to help breathing.   3. Pneumonia, likely community-acquired pneumonia because she did not use antibiotics within the past 3 months and was not admitted to the [name] or anything like that, but she did visit the emergency department briefly in March.  So will continue empiric antibiotics.  Obtain sputum cultures and adjust treatment accordingly.    4. Known history of chronic obstructive pulmonary disease.  Treatment as outlined above.    5. Tobacco dependence.  She smokes cigarettes, actively smoking.  I have discussed with her to stop smoking.  She smokes 1 and half pack of cigarettes a day.  I will reinforce this again, and again, a nicotine patch was offered.    6. Obesity with BMI of 33 kg/m2.  Encourage weight loss.  7. Uncontrolled hypertension.  Blood pressure was high when she came in.  Subsequently, it did get better but that could be due to her acute respiratory distress.  Will monitor blood pressure closely in house.  Adjust her antihypertensives as needed.  Continue her amlodipine.    8. Hyperlipidemia.  Continue Lipitor.    9. Uncontrolled type 2 diabetes mellitus.  Blood sugars were in the 300 range after she received the Solu-Medrol.  Continue Actos.  Follow fingersticks and cover with sliding scale.  She has Metformin. Allergy / intolerance reported as diarrhea, but then she could benefit from other oral diabetic medications.  I will see if I can add an oral diabetic medication like Amaryl.    PLAN OF CARE:  Patient is admitted to telemetry unit.  BiPAP is continued and kept as standby.  Will wean her down to oxygen by nasal cannula as tolerated when she is better.  Follow blood and sputum cultures.  Home medications reviewed and resumed as appropriate.  Current care plan was discussed with her and she is agreeable.  Smoking cessation will be reinforced again.    CODE STATUS:  Full code,     Further care and recommendations to  follow.         Critical care time spent with her medical management is 45 minutes.          Portions of this note may be dictated using voice recognition software or a dictation service. Variances in spelling and vocabulary are possible and unintentional. Not all errors are caught/corrected. Please notify the [facility] if any discrepancies are noted or if the meaning of any statement is not clear.

## 2017-05-21 NOTE — Nurses Notes (Addendum)
Text to M. Georgia Lopes, DNP re: HS BS= [id number] to determine if she wants additional Insulin administered.  12 units per order have been administered.

## 2017-05-21 NOTE — ED Nurses Note (Signed)
Report given to Hannah RN

## 2017-05-21 NOTE — ED Nurses Note (Signed)
Rounded on patient.  Reviewed vital signs and treatment plan.  Asked if patient had any needs, especially in the area of toileting, pain management and general comfort.  Addressed issues.  Asked the patient/family if they had any needs before I left the room.  Indicated that I would be back at the earliest convience to evaluate them again and update them on throughput progress.  Call bell within reach, side rails up to pt, stretcher in lowest position.     Pt is breathing un-labored and is able to speak in full and complete sentences now.  Pt reports wanting to quit smoking if her family also quits.  Pt tolerating the BIPAP mask well.  Pt reports very good relief from the BIPAP mask.  Pt aware of plan of care for admission to the hospital.

## 2017-05-22 ENCOUNTER — Inpatient Hospital Stay (HOSPITAL_BASED_OUTPATIENT_CLINIC_OR_DEPARTMENT_OTHER): Payer: BC Managed Care – PPO

## 2017-05-22 DIAGNOSIS — I517 Cardiomegaly: Secondary | ICD-10-CM

## 2017-05-22 DIAGNOSIS — F17200 Nicotine dependence, unspecified, uncomplicated: Secondary | ICD-10-CM | POA: Diagnosis present

## 2017-05-22 DIAGNOSIS — I371 Nonrheumatic pulmonary valve insufficiency: Secondary | ICD-10-CM

## 2017-05-22 DIAGNOSIS — I340 Nonrheumatic mitral (valve) insufficiency: Secondary | ICD-10-CM

## 2017-05-22 LAB — CBC WITH DIFF
BASOPHIL #: 0 10*3/uL (ref 0.00–0.10)
BASOPHIL %: 0 % (ref 0–3)
EOSINOPHIL %: 0 % (ref 0–5)
HCT: 40.7 % (ref 36.0–45.0)
HGB: 13.5 g/dL (ref 12.0–15.5)
LYMPHOCYTE #: 1.2 10*3/uL (ref 1.00–4.80)
LYMPHOCYTE %: 10 % — ABNORMAL LOW (ref 15–43)
MCH: 30.4 pg (ref 27.5–33.2)
MCHC: 33.2 g/dL (ref 32.0–36.0)
MCV: 91.5 fL (ref 82.0–97.0)
MONOCYTE #: 0.9 10*3/uL (ref 0.20–0.90)
MONOCYTE %: 7 % (ref 5–12)
MPV: 8.9 fL (ref 7.4–10.5)
NEUTROPHIL #: 10.3 10*3/uL — ABNORMAL HIGH (ref 1.50–6.50)
NEUTROPHIL %: 83 % — ABNORMAL HIGH (ref 43–76)
NEUTROPHIL %: 83 % — ABNORMAL HIGH (ref 43–76)
RDW: 13.9 % (ref 11.0–16.0)
RDW: 13.9 % (ref 11.0–16.0)
WBC: 12.5 10*3/uL — ABNORMAL HIGH (ref 4.0–11.0)

## 2017-05-22 LAB — ECG 12-LEAD
Atrial Rate: 125 {beats}/min
Calculated R Axis: 84 degrees
Calculated T Axis: 82 degrees
PR Interval: 136 ms
QRS Duration: 100 ms
QT Interval: 342 ms
QTC Calculation: 493 ms
Ventricular rate: 125 {beats}/min
Ventricular rate: 125 {beats}/min

## 2017-05-22 LAB — COMPREHENSIVE METABOLIC PROFILE - BMC/JMC ONLY
ALBUMIN: 3.2 g/dL — ABNORMAL LOW (ref 3.5–5.0)
ALT (SGPT): 17 U/L (ref 14–54)
AST (SGOT): 11 U/L — ABNORMAL LOW (ref 15–41)
BUN/CREA RATIO: 20 (ref 6–22)
CALCIUM: 9.1 mg/dL (ref 8.6–10.3)
CHLORIDE: 99 mmol/L — ABNORMAL LOW (ref 101–111)
CO2 TOTAL: 32 mmol/L (ref 22–32)
CREATININE: 0.44 mg/dL (ref 0.44–1.00)
ESTIMATED GFR: >60 mL/min/{1.73_m2} (ref >60)
POTASSIUM: 4 mmol/L (ref 3.4–5.1)
PROTEIN TOTAL: 6.6 g/dL (ref 6.4–8.3)

## 2017-05-22 LAB — POC FINGERSTICK GLUCOSE - BMC/JMC (RESULTS)
GLUCOSE, POC: 258 mg/dl — ABNORMAL HIGH (ref 60–100)
GLUCOSE, POC: 264 mg/dl — ABNORMAL HIGH (ref 60–100)
GLUCOSE, POC: 280 mg/dl — ABNORMAL HIGH (ref 60–100)
GLUCOSE, POC: 280 mg/dl — ABNORMAL HIGH (ref 60–100)
GLUCOSE, POC: 268 mg/dl — ABNORMAL HIGH (ref 60–100)

## 2017-05-22 MED ORDER — SODIUM CHLORIDE 0.9 % INJECTION SOLUTION
2.00 mL | INTRAMUSCULAR | Status: AC
Start: 2017-05-22 — End: 2017-05-22
  Administered 2017-05-22: 2 mL via INTRAVENOUS

## 2017-05-22 MED ORDER — PREDNISONE 20 MG TABLET
40.00 mg | ORAL_TABLET | Freq: Every morning | ORAL | Status: DC
Start: 2017-05-22 — End: 2017-05-24
  Administered 2017-05-22 – 2017-05-24 (×3): 40 mg via ORAL
  Filled 2017-05-22 (×3): qty 2

## 2017-05-22 MED ORDER — CEFTRIAXONE 1 GRAM/50 ML IN DEXTROSE (ISO-OSMOT) INTRAVENOUS PIGGYBACK
1.0000 g | INJECTION | INTRAVENOUS | Status: DC
Start: 2017-05-22 — End: 2017-05-24
  Administered 2017-05-22: 0 g via INTRAVENOUS
  Administered 2017-05-22 – 2017-05-23 (×2): 1 g via INTRAVENOUS
  Administered 2017-05-23: 0 g via INTRAVENOUS
  Filled 2017-05-22 (×4): qty 50

## 2017-05-22 MED ORDER — AZITHROMYCIN 500 MG INTRAVENOUS SOLUTION
500.00 mg | INTRAVENOUS | Status: DC
Start: 2017-05-22 — End: 2017-05-24
  Administered 2017-05-22: 0 mg via INTRAVENOUS
  Administered 2017-05-22: 500 mg via INTRAVENOUS
  Administered 2017-05-23: 0 mg via INTRAVENOUS
  Administered 2017-05-23: 500 mg via INTRAVENOUS
  Filled 2017-05-22 (×4): qty 5

## 2017-05-22 MED ADMIN — oxyCODONE-acetaminophen 5 mg-325 mg tablet: INTRAVENOUS | @ 11:00:00

## 2017-05-22 MED ADMIN — albuterol sulfate concentrate 2.5 mg/0.5 mL solution for nebulization: RESPIRATORY_TRACT | @ 03:00:00

## 2017-05-22 MED ADMIN — predniSONE 20 mg tablet: ORAL | @ 12:00:00

## 2017-05-22 MED ADMIN — sodium chloride 0.9 % intravenous solution: RESPIRATORY_TRACT | @ 23:00:00 | NDC 00338004904

## 2017-05-22 MED ADMIN — lactated Ringers intravenous solution: RESPIRATORY_TRACT | @ 10:00:00 | NDC 00338011704

## 2017-05-22 NOTE — Nurses Notes (Signed)
pt BSFS was 268, RN notified

## 2017-05-22 NOTE — Nurses Notes (Signed)
Assumed care of pt.  Pt awake watching television.  No distress noted at this time. Continue to monitor

## 2017-05-22 NOTE — Pharmacy (Signed)
[facility] PCU/Telemetry Rounds                                                 [name], [name], 62 y.o. female  Date of Admission:  05/21/2017  Date of service: 05/22/2017  Date of Birth:  1955-10-12    Current height: Height: 5' 2.99"  Current weight: Weight: 86.6 kg (190 lb 14.7 oz)  Dosing weight:  86.6 kg     Subjective: Breathing better. Concerned about possible OSA and requesting CPAP. Tearful.      ASSESSMENT/PLAN:    Neuro- No recommendations     Cardio- HTN hx with current measurements > 152mmHg SBP.  Amlodipine 10mg  QD for management currently. If BP continues elevated, consider the addition of an ACEI (lisinopril 5-10mg  QD) for BP measurement and concomitant DM for macrovascular benefit.    Pulm- AECOPD 2' to CAP and chronic cigarette smoking. Albuterol/ipratropium Q4H scheduled, Symbicort 160/4.5mg  1 puff BID active. Methylprednisolone IV transitioned to Prednsione 40mg  QD starting this AM.    GI- No recommendations    Endocrine- Blood glucoses elevated partially due to steroids. Lantus 20 units QPM initiated with some success (AM level 258) along with SSIP.    Renal- No recommendations    Heme- No recommendations    ID- CAP being treated with Ceftriaxone 1g Q24H and Azithromycin 500mg  Q24H.  Would recommend 5-day course of antibiotics pending quick resolution.    F/E/N- No recommendations    Prophylaxis-   DVT prophylaxis- Enoxaparin    GI prophylaxis- Not indicated    Current Medications:    Current Facility-Administered Medications:  acetaminophen (TYLENOL) tablet 650 mg Oral Q6H PRN   albuterol (PROVENTIL) 2.5mg / 0.5 mL nebulizer solution 2.5 mg Nebulization Q4H   And      ipratropium (ATROVENT) 0.02% nebulizer solution 0.5 mg Nebulization Q4H   albuterol (VENTOLIN) continuous nebulization 10 mg/hr Continuous Nebulization Continuous   amLODIPine (NORVASC) tablet 10 mg Oral Daily   aspirin chewable tablet 81 mg 81 mg Oral Daily   atorvastatin (LIPITOR) tablet 40 mg Oral Daily      azithromycin (ZITHROMAX) 500 mg in NS 250 mL IVPB 500 mg Intravenous Q24H   budesonide-formoterol (SYMBICORT) 160 mcg-4.5 mcg per inhalation oral inhaler - "Respiratory to administer" 1 Puff Inhalation 2x/day   cefTRIAXone (ROCEPHIN) 1 g in iso-osmotic 50 mL premix IVPB 1 g Intravenous Q24H   SSIP insulin lispro (HUMALOG) 100 units/mL injection  Subcutaneous 4x/day AC   And      dextrose 50% (0.5 g/mL) injection - syringe 12.5 g Intravenous Q15 Min PRN   enoxaparin (LOVENOX) 40 mg/0.4 mL SubQ injection 40 mg Subcutaneous Daily   insulin glargine (LANTUS) 100 units/mL injection 20 Units Subcutaneous Daily   morphine 4 mg/mL injection 4 mg Intravenous Q5 Min PRN   nitroGLYCERIN (NITROSTAT) sublingual tablet 0.4 mg Sublingual Q5 Min PRN   NS flush syringe 10 mL Intravenous Q8HRS   NS flush syringe 10 mL Intravenous Q1H PRN   pioglitazone (ACTOS) tablet 15 mg Oral Daily   predniSONE (DELTASONE) tablet 40 mg Oral Daily with Breakfast   raNITIdine (ZANTAC) tablet 150 mg Oral 2x/day        Vital Signs:  Temp (24hrs) Max:37.5 C (23.5 F)      Systolic (57DUK), GUR:427 , Min:130 , CWC:376     Diastolic (28BTD),  Avg:72, Min:65, Max:80    Temp  Avg: 36.8 C (98.3 F)  Min: 36.6 C (97.8 F)  Max: 37.5 C (99.5 F)  MAP (Non-Invasive)  Avg: 93 mmHG  Min: 86 mmHG  Max: 100 mmHG  Pulse  Avg: 90.8  Min: 75  Max: 102  Resp  Avg: 18.6  Min: 18  Max: 19  SpO2  Avg: 94.8 %  Min: 93 %  Max: 96 %  Pain Score (Numeric, Faces): 0    I/O last 24 hours:      Intake/Output Summary (Last 24 hours) at 05/22/2017 1117  Last data filed at 05/22/2017 1111  Gross per 24 hour   Intake 1420 ml   Output 1000 ml   Net 420 ml      I/O current shift:  05/13 0700 - 05/13 1859  In: 60 [I.V.:60]  Out: -     Labs:  I have reviewed all lab results.  CrCl: 141.9 mL/min    Thank you!  Coralie Carpen, [place]   05/22/2017, 11:17

## 2017-05-22 NOTE — Progress Notes (Signed)
[facility]  [place], [place] [address]    IP PROGRESS NOTE      Cella, Cappello  Date of Admission:  05/21/2017  Date of Birth:  01/04/1956  Date of Service:  05/22/2017    Chief Complaint:  Patient was admitted with acute COPD exacerbation and pneumonia.  She is a chronic smoker.      Subjective:  She was on BiPAP yesterday in the ED for a short time, now on nasal cannula.  Feels much better.  Shortness of breath is improving and denies any chest pain.    Vital Signs:  Temp (24hrs) Max:37.1 C (98.7 F)      Temperature: 36.7 C (98 F)  BP (Non-Invasive): (!) 143/73  MAP (Non-Invasive): 90 mmHG  Heart Rate: (!) 102  Respiratory Rate: 19  Pain Score (Numeric, Faces): 0  SpO2: 97 %    Current Medications:    Current Facility-Administered Medications:  acetaminophen (TYLENOL) tablet 650 mg Oral Q6H PRN   albuterol (PROVENTIL) 2.5mg / 0.5 mL nebulizer solution 2.5 mg Nebulization Q4H   And      ipratropium (ATROVENT) 0.02% nebulizer solution 0.5 mg Nebulization Q4H   albuterol (VENTOLIN) continuous nebulization 10 mg/hr Continuous Nebulization Continuous   amLODIPine (NORVASC) tablet 10 mg Oral Daily   aspirin chewable tablet 81 mg 81 mg Oral Daily   atorvastatin (LIPITOR) tablet 40 mg Oral Daily   azithromycin (ZITHROMAX) 500 mg in NS 250 mL IVPB 500 mg Intravenous Q24H   budesonide-formoterol (SYMBICORT) 160 mcg-4.5 mcg per inhalation oral inhaler - "Respiratory to administer" 1 Puff Inhalation 2x/day   cefTRIAXone (ROCEPHIN) 1 g in iso-osmotic 50 mL premix IVPB 1 g Intravenous Q24H   SSIP insulin lispro (HUMALOG) 100 units/mL injection  Subcutaneous 4x/day AC   And      dextrose 50% (0.5 g/mL) injection - syringe 12.5 g Intravenous Q15 Min PRN   enoxaparin (LOVENOX) 40 mg/0.4 mL SubQ injection 40 mg Subcutaneous Daily   insulin glargine (LANTUS) 100 units/mL injection 20 Units Subcutaneous Daily   morphine 4 mg/mL injection 4 mg Intravenous Q5 Min PRN   nitroGLYCERIN (NITROSTAT) sublingual  tablet 0.4 mg Sublingual Q5 Min PRN   NS flush syringe 10 mL Intravenous Q8HRS   NS flush syringe 10 mL Intravenous Q1H PRN   pioglitazone (ACTOS) tablet 15 mg Oral Daily   predniSONE (DELTASONE) tablet 40 mg Oral Daily with Breakfast   raNITIdine (ZANTAC) tablet 150 mg Oral 2x/day       Today's Physical Exam:    GENERAL: Patient is alert, awake and oriented X 3. In no cardio respiratory distress. Has a large more on her upper eyelid on right side. She is obese and is chronically ill appearing.   HEENT: PERRLA, EOMI.   NECK: Supple, NO JVD.   HEART: S1+, S2+, rate controlled and rhythm regular. No murmurs appreciated.   LUNGS: Bilateral breath sounds coarse, has ronchi and expiratory wheezing.   ABDOMEN: Soft, NT, ND with NABS.   EXTREMITIES: No edema, pedal pulses palpable.   NEURO: Motor exam is non focal.   SKIN: No rashes or bruises.   PSYCH: Patient is awake, oriented X 3,  with no signs of depression.      I/O:  I/O last 24 hours:      Intake/Output Summary (Last 24 hours) at 05/22/2017 1943  Last data filed at 05/22/2017 1942  Gross per 24 hour   Intake 1305 ml   Output 0 ml   Net  1305 ml     I/O current shift:  05/13 1900 - 05/14 0659  In: 360 [P.O.:360]  Out: 0     Nutrition/Residuals:  DIET CARDIAC    Labs  -   Reviewed    Reviewed:   Lab Results for Last 24 Hours:    Results for orders placed or performed during the hospital encounter of 05/21/17 (from the past 24 hour(s))   COMPREHENSIVE METABOLIC PROFILE - [facility] ONLY   Result Value Ref Range    SODIUM 136 136 - 145 mmol/L    POTASSIUM 4.0 3.4 - 5.1 mmol/L    CHLORIDE 99 (L) 101 - 111 mmol/L    CO2 TOTAL 32 22 - 32 mmol/L    ANION GAP 5 3 - 11 mmol/L    BUN 9 6 - 20 mg/dL    CREATININE 0.44 0.44 - 1.00 mg/dL    BUN/CREA RATIO 20 6 - 22    ESTIMATED GFR >60 >60 mL/min/1.70m^2    ALBUMIN 3.2 (L) 3.5 - 5.0 g/dL    CALCIUM 9.1 8.6 - 10.3 mg/dL    GLUCOSE 250 (H) 70 - 110 mg/dL    ALKALINE PHOSPHATASE 67 38 - 126 U/L    ALT (SGPT) 17 14 - 54 U/L    AST  (SGOT) 11 (L) 15 - 41 U/L    BILIRUBIN TOTAL 0.8 0.3 - 1.2 mg/dL    PROTEIN TOTAL 6.6 6.4 - 8.3 g/dL    ALBUMIN/GLOBULIN RATIO 0.9 0.8 - 2.0   CBC WITH DIFF   Result Value Ref Range    WBC 12.5 (H) 4.0 - 11.0 x10^3/uL    RBC 4.45 4.00 - 5.10 x10^6/uL    HGB 13.5 12.0 - 15.5 g/dL    HCT 40.7 36.0 - 45.0 %    MCV 91.5 82.0 - 97.0 fL    MCH 30.4 27.5 - 33.2 pg    MCHC 33.2 32.0 - 36.0 g/dL    RDW 13.9 11.0 - 16.0 %    PLATELETS 282 150 - 450 x10^3/uL    MPV 8.9 7.4 - 10.5 fL    NEUTROPHIL % 83 (H) 43 - 76 %    LYMPHOCYTE % 10 (L) 15 - 43 %    MONOCYTE % 7 5 - 12 %    EOSINOPHIL % 0 0 - 5 %    BASOPHIL % 0 0 - 3 %    NEUTROPHIL # 10.30 (H) 1.50 - 6.50 x10^3/uL    LYMPHOCYTE # 1.20 1.00 - 4.80 x10^3/uL    MONOCYTE # 0.90 0.20 - 0.90 x10^3/uL    EOSINOPHIL # 0.00 0.00 - 0.50 x10^3/uL    BASOPHIL # 0.00 0.00 - 0.10 x10^3/uL   POC FINGERSTICK GLUCOSE - [facility] (RESULTS)   Result Value Ref Range    GLUCOSE, POC 258 (H) 60 - 100 mg/dl   POC FINGERSTICK GLUCOSE - [facility] (RESULTS)   Result Value Ref Range    GLUCOSE, POC 264 (H) 60 - 100 mg/dl   POC FINGERSTICK GLUCOSE - [facility] (RESULTS)   Result Value Ref Range    GLUCOSE, POC 280 (H) 60 - 100 mg/dl   POC FINGERSTICK GLUCOSE - [facility] (RESULTS)   Result Value Ref Range    GLUCOSE, POC 268 (H) 60 - 100 mg/dl     Ordered:      Diagnostic Tests - reviewed      Radiology Tests -  Reviewed        Problem List:  Active Hospital Problems   Marland Kitchen  Primary Problem)    Diagnosis   . Tobacco use disorder   . Acute respiratory failure (CMS HCC)       Assessment/ Plan:       Acute respiratory failure with mild hypercapnia and hypoxia.  Patient has acute worsening of her hypoxia on her chronic underlying hypoxia.  She was placed on BiPAP in the ED, was given IV Solu-Medrol which is changed to PO Steroids this morning, continue empiric antibiotics and albuterol and Atrovent treatments, she is feeling better this morning but is still wheezing.     Acute chronic obstructive pulmonary disease  exacerbation.  Continue albuterol and Atrovent nebs.  Continue Symbicort off with long-acting beta agonists and inhaled steroids.  Also continue systemic steroids to help breathing.     Pneumonia, likely community-acquired pneumonia because she did not use antibiotics within the past 3 months and was not admitted to the hospital or anything like that, but she did visit the emergency department briefly in March.  So will continue empiric antibiotics. Her lactate was 1.4 indicating no sepsis. Ordered sputum cultures. Follow culture date and adjust treatment accordingly.      Known history of chronic obstructive pulmonary disease.  Treatment as outlined above.      Tobacco dependence.  She smokes cigarettes, actively smoking.  I have discussed with her to stop smoking.  She smokes 1 and half pack of cigarettes a day.  I reinforced this again this morning, Nicotine patch was offered.      Obesity with BMI of 33 kg/m2.  Encourage weight loss.      Uncontrolled hypertension.  Blood pressure was high when she came in.  Subsequently, it did get better but that could be due to her acute respiratory distress.  Will monitor blood pressure closely in house.  Adjust her antihypertensives as needed.  Continue her amlodipine.      Hyperlipidemia.  Continue Lipitor.      Uncontrolled type 2 diabetes mellitus.  Blood sugars were in the 300 range after she received the Solu-Medrol.  Continue Actos.  Follow fingersticks and cover with sliding scale.  She has Metformin. Allergy / intolerance reported as diarrhea, but then she could benefit from other oral diabetic medications.  I will see if I can add an oral diabetic medication like Amaryl. Lantus was started due to high blood sugars    PLAN:      BiPAP is continued and kept as standby.      Wean her down to oxygen by nasal cannula as   tolerated when she is better.      Follow blood and sputum cultures.      Current care plan was discussed with her and she is agreeable.     CODE  STATUS:  Full code.    On DVT and GI prophylaxis with Lovenox and Zantac respectively.          Marge Duncans, MD 05/22/2017, 19:43

## 2017-05-22 NOTE — Nurses Notes (Signed)
Report received from [place], [place]. Assumed care of patient at this time.

## 2017-05-23 DIAGNOSIS — J441 Chronic obstructive pulmonary disease with (acute) exacerbation: Secondary | ICD-10-CM | POA: Diagnosis present

## 2017-05-23 LAB — CBC W/AUTO DIFF
EOSINOPHIL #: 0 x10ˆ3/uL (ref 0.00–0.50)
EOSINOPHIL %: 0 % (ref 0–5)
HGB: 14 g/dL (ref 12.0–15.5)
LYMPHOCYTE %: 22 % (ref 15–43)
LYMPHOCYTE %: 22 % (ref 15–43)
MCH: 30.2 pg (ref 27.5–33.2)
MCH: 30.2 pg (ref 27.5–33.2)
MCHC: 32.5 g/dL (ref 32.0–36.0)
MONOCYTE #: 1.6 10*3/uL — ABNORMAL HIGH (ref 0.20–0.90)
MONOCYTE %: 10 % (ref 5–12)
MPV: 8.3 fL (ref 7.4–10.5)
NEUTROPHIL #: 10.4 10*3/uL — ABNORMAL HIGH (ref 1.50–6.50)
NEUTROPHIL #: 10.4 10*3/uL — ABNORMAL HIGH (ref 1.50–6.50)
NEUTROPHIL %: 68 % (ref 43–76)
PLATELETS: 304 x10?3/uL (ref 150–450)
WBC: 15.4 10*3/uL — ABNORMAL HIGH (ref 4.0–11.0)

## 2017-05-23 LAB — POC FINGERSTICK GLUCOSE - BMC/JMC (RESULTS)
GLUCOSE, POC: 210 mg/dl — ABNORMAL HIGH (ref 60–100)
GLUCOSE, POC: 262 mg/dl — ABNORMAL HIGH (ref 60–100)
GLUCOSE, POC: 320 mg/dl — ABNORMAL HIGH (ref 60–100)
GLUCOSE, POC: 305 mg/dl — ABNORMAL HIGH (ref 60–100)

## 2017-05-23 LAB — COMPREHENSIVE METABOLIC PROFILE - BMC/JMC ONLY
ALT (SGPT): 17 U/L (ref 14–54)
AST (SGOT): 13 U/L — ABNORMAL LOW (ref 15–41)
BILIRUBIN TOTAL: 0.4 mg/dL (ref 0.3–1.2)
BUN: 14 mg/dL (ref 6–20)
CALCIUM: 9 mg/dL (ref 8.6–10.3)
CALCIUM: 9 mg/dL (ref 8.6–10.3)
CHLORIDE: 98 mmol/L — ABNORMAL LOW (ref 101–111)
ESTIMATED GFR: >60 mL/min/{1.73_m2} (ref >60)
GLUCOSE: 212 mg/dL — ABNORMAL HIGH (ref 70–110)
POTASSIUM: 3.7 mmol/L (ref 3.4–5.1)
SODIUM: 138 mmol/L (ref 136–145)

## 2017-05-23 MED ORDER — LISINOPRIL 5 MG TABLET
5.0000 mg | ORAL_TABLET | Freq: Every day | ORAL | Status: DC
Start: 2017-05-23 — End: 2017-05-24
  Administered 2017-05-23 – 2017-05-24 (×2): 5 mg via ORAL
  Filled 2017-05-23 (×2): qty 1

## 2017-05-23 MED ORDER — INSULIN GLARGINE (U-100) 100 UNIT/ML SUBCUTANEOUS SOLUTION
10.00 [IU] | Freq: Every evening | SUBCUTANEOUS | Status: DC
Start: 2017-05-23 — End: 2017-05-24
  Administered 2017-05-23: 10 [IU] via SUBCUTANEOUS
  Filled 2017-05-23: qty 200

## 2017-05-23 MED ADMIN — Medication: INTRAVENOUS | @ 17:00:00

## 2017-05-23 MED ADMIN — sodium chloride 0.9 % (flush) injection syringe: SUBCUTANEOUS | @ 09:00:00

## 2017-05-23 MED ADMIN — sodium chloride 0.9 % intravenous solution: RESPIRATORY_TRACT | @ 02:00:00 | NDC 00338004904

## 2017-05-23 NOTE — Progress Notes (Signed)
IP PROGRESS NOTE      [name], [name]  Date of Admission:  05/21/2017  Date of Birth:  [birth year]  Date of Service:  05/23/2017    Chief Complaint:  Admitted for COPD exacerbation and pneumonia.  Chronic smoker.  Subjective:  Breathing slightly better today.  Complain of dry cough.  Bilateral chest pain on coughing.  No fever.  Short of breath on activity.  No other complaints.      Vital Signs:  Temp (24hrs) Max:36.7 C (98 F)      Temperature: 36.6 C (97.9 F)  BP (Non-Invasive): (!) 151/77  MAP (Non-Invasive): 97 mmHG  Heart Rate: (!) 114  Respiratory Rate: 18  Pain Score (Numeric, Faces): 0  SpO2: 92 %    Current Medications:    Current Facility-Administered Medications:  acetaminophen (TYLENOL) tablet 650 mg Oral Q6H PRN   albuterol (PROVENTIL) 2.5mg / 0.5 mL nebulizer solution 2.5 mg Nebulization Q4H   And      ipratropium (ATROVENT) 0.02% nebulizer solution 0.5 mg Nebulization Q4H   albuterol (VENTOLIN) continuous nebulization 10 mg/hr Continuous Nebulization Continuous   amLODIPine (NORVASC) tablet 10 mg Oral Daily   aspirin chewable tablet 81 mg 81 mg Oral Daily   atorvastatin (LIPITOR) tablet 40 mg Oral Daily   azithromycin (ZITHROMAX) 500 mg in NS 250 mL IVPB 500 mg Intravenous Q24H   budesonide-formoterol (SYMBICORT) 160 mcg-4.5 mcg per inhalation oral inhaler - "Respiratory to administer" 1 Puff Inhalation 2x/day   cefTRIAXone (ROCEPHIN) 1 g in iso-osmotic 50 mL premix IVPB 1 g Intravenous Q24H   SSIP insulin lispro (HUMALOG) 100 units/mL injection  Subcutaneous 4x/day AC   And      dextrose 50% (0.5 g/mL) injection - syringe 12.5 g Intravenous Q15 Min PRN   enoxaparin (LOVENOX) 40 mg/0.4 mL SubQ injection 40 mg Subcutaneous Daily   insulin glargine (LANTUS) 100 units/mL injection 20 Units Subcutaneous Daily   morphine 4 mg/mL injection 4 mg Intravenous Q5 Min PRN   nitroGLYCERIN (NITROSTAT) sublingual tablet 0.4 mg Sublingual Q5 Min PRN   NS flush syringe 10 mL Intravenous Q8HRS   NS flush syringe 10  mL Intravenous Q1H PRN   pioglitazone (ACTOS) tablet 15 mg Oral Daily   predniSONE (DELTASONE) tablet 40 mg Oral Daily with Breakfast   raNITIdine (ZANTAC) tablet 150 mg Oral 2x/day       Today's Physical Exam:  General: appears chronically sick.  Obese. No distress.   Eyes: Pupils equal and round, reactive to light and accomodation.   HENT:Head atraumatic and normocephalic   Neck: No JVD or thyromegaly or lymphadenopathy   Lungs: CTAB, non labored breathing, no rales.  Bilateral wheezing.    Cardiovascular: regular rate and rhythm, S1, S2 normal, no murmur,   Abdomen: Soft, non-tender, Bowel sounds normal, No hepatosplenomegaly   Extremities: extremities normal, atraumatic, no cyanosis or edema   Skin: Skin warm and dry   Neurologic: Grossly normal   Psychiatric:  Anxious.        I/O:  I/O last 24 hours:      Intake/Output Summary (Last 24 hours) at 05/23/2017 1447  Last data filed at 05/23/2017 1300  Gross per 24 hour   Intake 1200 ml   Output 0 ml   Net 1200 ml     I/O current shift:  05/14 0700 - 05/14 1859  In: 600 [P.O.:600]  Out: -       Labs  Please indicate ordered or reviewed)  Reviewed:   Lab Results for  Last 24 Hours:    Results for orders placed or performed during the hospital encounter of 05/21/17 (from the past 24 hour(s))   POC FINGERSTICK GLUCOSE - [facility] (RESULTS)   Result Value Ref Range    GLUCOSE, POC 280 (H) 60 - 100 mg/dl   POC FINGERSTICK GLUCOSE - [facility] (RESULTS)   Result Value Ref Range    GLUCOSE, POC 268 (H) 60 - 100 mg/dl   CBC W/AUTO DIFF - [facility] ONLY   Result Value Ref Range    WBC 15.4 (H) 4.0 - 11.0 x10^3/uL    RBC 4.62 4.00 - 5.10 x10^6/uL    HGB 14.0 12.0 - 15.5 g/dL    HCT 43.0 36.0 - 45.0 %    MCV 93.1 82.0 - 97.0 fL    MCH 30.2 27.5 - 33.2 pg    MCHC 32.5 32.0 - 36.0 g/dL    RDW 14.2 11.0 - 16.0 %    PLATELETS 304 150 - 450 x10^3/uL    MPV 8.3 7.4 - 10.5 fL    NEUTROPHIL % 68 43 - 76 %    LYMPHOCYTE % 22 15 - 43 %    MONOCYTE % 10 5 - 12 %    EOSINOPHIL % 0 0 - 5 %    BASOPHIL %  0 0 - 3 %    NEUTROPHIL # 10.40 (H) 1.50 - 6.50 x10^3/uL    LYMPHOCYTE # 3.40 1.00 - 4.80 x10^3/uL    MONOCYTE # 1.60 (H) 0.20 - 0.90 x10^3/uL    EOSINOPHIL # 0.00 0.00 - 0.50 x10^3/uL    BASOPHIL # 0.00 0.00 - 0.10 x10^3/uL   COMPREHENSIVE METABOLIC PROFILE - [facility] ONLY   Result Value Ref Range    SODIUM 138 136 - 145 mmol/L    POTASSIUM 3.7 3.4 - 5.1 mmol/L    CHLORIDE 98 (L) 101 - 111 mmol/L    CO2 TOTAL 32 22 - 32 mmol/L    ANION GAP 8 3 - 11 mmol/L    BUN 14 6 - 20 mg/dL    CREATININE 0.52 0.44 - 1.00 mg/dL    BUN/CREA RATIO 27 (H) 6 - 22    ESTIMATED GFR >60 >60 mL/min/1.46m^2    ALBUMIN 3.4 (L) 3.5 - 5.0 g/dL    CALCIUM 9.0 8.6 - 10.3 mg/dL    GLUCOSE 212 (H) 70 - 110 mg/dL    ALKALINE PHOSPHATASE 67 38 - 126 U/L    ALT (SGPT) 17 14 - 54 U/L    AST (SGOT) 13 (L) 15 - 41 U/L    BILIRUBIN TOTAL 0.4 0.3 - 1.2 mg/dL    PROTEIN TOTAL 6.8 6.4 - 8.3 g/dL    ALBUMIN/GLOBULIN RATIO 1.0 0.8 - 2.0   POC FINGERSTICK GLUCOSE - [facility] (RESULTS)   Result Value Ref Range    GLUCOSE, POC 210 (H) 60 - 100 mg/dl   POC FINGERSTICK GLUCOSE - [facility] (RESULTS)   Result Value Ref Range    GLUCOSE, POC 262 (H) 60 - 100 mg/dl           Radiology Tests (Please indicate ordered or reviewed)  Reviewed: N/A      Problem List:  Active Hospital Problems   (*Primary Problem)    Diagnosis   . *COPD with acute exacerbation (CMS HCC)   . Tobacco use disorder   . Acute respiratory failure (CMS HCC)   . Hypertension due to endocrine disorder   . Diabetes (CMS [place])   . Obesity (BMI 35.0-39.9  without comorbidity)       Assessment/ Plan:     1. Acute respiratory failure with hypercapnia and hypoxia.  Clinically improving.  Currently on oxygen via nasal cannula.  Off BiPAP.  2. COPD with acute exacerbation.  Clinically improving.  Continue prednisone, Symbicort, DuoNeb as needed.  Oxygen via nasal cannula.  3. Community-acquired pneumonia.  Continue IV Rocephin and Zithromax.  Afebrile.  Clinically improving.  4. Smoker.  Counseled to stop  smoking.  Nicotine patch offered.  5. Obesity.  BMI 35.8.  Would benefit from weight loss.  6. Hypertension.  Uncontrolled.  Continue Norvasc.  Add lisinopril.  7. Dyslipidemia.  Continue Lipitor.  8. Type 2 diabetes.  Continue Lantus, Actos with sliding scale coverage.  Uncontrolled.  Increase Lantus dose.      DVT prophylaxis.  Lovenox SC.  Code status.  Full code.  Disposition.  Hopefully home in 2-3 days.

## 2017-05-23 NOTE — Care Management Notes (Signed)
05/23/17 1300   Assessment Detail   Assessment Type Admission   Date of Care Management Update 05/23/17   Readmission   Is this a readmission? No   Social Work Animal nutritionist Status plan in progress   Anticipated Discharge Disposition [place] to home when improved   Discharge Needs Assessment   Equipment Needed After Discharge nebulizer;oxygen   Referral Information   Admission Type inpatient   Care Manager Assigned to Case Acie Fredrickson, RN   Please enter SW/CM consult for any discharge needs identified.

## 2017-05-24 LAB — POC FINGERSTICK GLUCOSE - BMC/JMC (RESULTS): GLUCOSE, POC: 133 mg/dl — ABNORMAL HIGH (ref 60–100)

## 2017-05-24 MED ORDER — RANITIDINE 150 MG TABLET
150.00 mg | ORAL_TABLET | Freq: Two times a day (BID) | ORAL | 0 refills | Status: DC
Start: 2017-05-24 — End: 2017-10-20

## 2017-05-24 MED ORDER — SODIUM CHLORIDE 0.9 % INTRAVENOUS SOLUTION
2.0000 g | INTRAVENOUS | Status: DC
Start: 2017-05-24 — End: 2017-05-24
  Filled 2017-05-24 (×2): qty 20

## 2017-05-24 MED ORDER — CEFTRIAXONE 1 GRAM/50 ML IN DEXTROSE (ISO-OSMOT) INTRAVENOUS PIGGYBACK
1.0000 g | INJECTION | INTRAVENOUS | Status: DC
Start: 2017-05-24 — End: 2017-05-24
  Administered 2017-05-24: 0 g via INTRAVENOUS
  Administered 2017-05-24: 1 g via INTRAVENOUS
  Filled 2017-05-24 (×2): qty 50

## 2017-05-24 MED ORDER — IPRATROPIUM BROMIDE 0.02 % SOLUTION FOR INHALATION
0.50 mg | RESPIRATORY_TRACT | 0 refills | Status: DC
Start: 2017-05-24 — End: 2017-07-14

## 2017-05-24 MED ORDER — LISINOPRIL 5 MG TABLET
5.0000 mg | ORAL_TABLET | Freq: Every day | ORAL | 0 refills | Status: DC
Start: 2017-05-25 — End: 2017-05-31

## 2017-05-24 MED ORDER — PREDNISONE 10 MG TABLET
10.0000 mg | ORAL_TABLET | Freq: Every day | ORAL | 0 refills | Status: DC
Start: 2017-05-24 — End: 2017-05-31

## 2017-05-24 MED ORDER — GLIMEPIRIDE 2 MG TABLET
2.00 mg | ORAL_TABLET | Freq: Every morning | ORAL | 0 refills | Status: DC
Start: 2017-05-24 — End: 2018-03-29

## 2017-05-24 MED ORDER — CEFUROXIME AXETIL 500 MG TABLET
500.00 mg | ORAL_TABLET | Freq: Two times a day (BID) | ORAL | 0 refills | Status: AC
Start: 2017-05-24 — End: 2017-05-28

## 2017-05-24 MED ORDER — ALBUTEROL SULFATE CONCENTRATE 2.5 MG/0.5 ML SOLUTION FOR NEBULIZATION
2.50 mg | INHALATION_SOLUTION | RESPIRATORY_TRACT | 0 refills | Status: DC
Start: 2017-05-24 — End: 2017-07-14

## 2017-05-24 MED ADMIN — sodium chloride 0.9 % intravenous solution: ORAL | @ 09:00:00 | NDC 00338004904

## 2017-05-24 MED ADMIN — sodium chloride 0.9 % (flush) injection syringe: RESPIRATORY_TRACT | @ 03:00:00

## 2017-05-24 MED ADMIN — sodium chloride 0.9 % (flush) injection syringe: ORAL | @ 09:00:00

## 2017-05-24 MED ADMIN — ALBUTEROL 5 MG INHALATION: SUBCUTANEOUS | @ 09:00:00 | NDC 00487990130

## 2017-05-24 NOTE — Nurses Notes (Signed)
Oxygen Trial:  Room Air at rest:  89 %  Room Air with Activity   78 %  O2 applied at  2  Liters Nasal Cannula  with ambulation O2 sat  92 %

## 2017-05-24 NOTE — Pharmacy (Signed)
[facility]  Pharmacy Discharge Counseling  Holleigh, [place], 62 y.o. female  Date of Birth:  08-12-1955  Date of Admission:  05/21/2017  MRN# [id number]    Allergies   Allergen Reactions   . Codeine  Other Adverse Reaction (Add comment)     Sick to stomach   . Hydrocodone Nausea/ Vomiting   . Metformin Diarrhea   . Oxycodone Nausea/ Vomiting       Active Hospital Problems    Diagnosis   . Primary Problem: COPD with acute exacerbation (CMS HCC)   . Tobacco use disorder   . Acute respiratory failure (CMS HCC)   . Hypertension due to endocrine disorder   . Diabetes (CMS [place])   . Obesity (BMI 35.0-39.9 without comorbidity)          Current Discharge Medication List      START taking these medications.      Details   cefuroxime 500 mg Tablet  Commonly known as:  CEFTIN   500 mg, Oral, 2 TIMES DAILY  Qty:  8 Tab  Refills:  0     glimepiride 2 mg Tablet  Commonly known as:  AMARYL   2 mg, Oral, EVERY MORNING  Qty:  30 Tab  Refills:  0     lisinopril 5 mg Tablet  Commonly known as:  PRINIVIL  Start taking on:  05/25/2017   5 mg, Oral, DAILY  Qty:  30 Tab  Refills:  0     predniSONE 10 mg Tablet  Commonly known as:  DELTASONE   10 mg, Oral, DAILY, Take 30 mg daily for 2 days.  Then take 20 mg daily for 3 days.  Lastly take 10 mg daily for 3 days and stop  Qty:  30 Tab  Refills:  0     raNITIdine 150 mg Tablet  Commonly known as:  ZANTAC   150 mg, Oral, 2 TIMES DAILY  Qty:  60 Tab  Refills:  0        CONTINUE these medications which have CHANGED during your visit.      Details   * albuterol 2.5 mg/0.5 mL Solution for Nebulization  Commonly known as:  PROVENTIL  What changed:  Another medication with the same name was added. Make sure you understand how and when to take each.   2.5 mg, Nebulization, 4 TIMES DAILY  Qty:  50 Each  Refills:  0     * albuterol 2.5 mg/0.5 mL Solution for Nebulization  Commonly known as:  PROVENTIL  What changed:  You were already taking a medication with the same name, and this prescription  was added. Make sure you understand how and when to take each.   2.5 mg, Nebulization, EVERY 4 HOURS  Qty:  30 Each  Refills:  0     * ATROVENT HFA 17 mcg/actuation HFA Aerosol Inhaler oral inhaler  Generic drug:  ipratropium bromide  What changed:  Another medication with the same name was added. Make sure you understand how and when to take each.   INHALE 2 PUFFS BY MOUTH 4 TIMES DAILY  Qty:  1 Inhaler  Refills:  2     * ipratropium 0.02 % Solution  Commonly known as:  ATROVENT  What changed:  You were already taking a medication with the same name, and this prescription was added. Make sure you understand how and when to take each.   0.5 mg, Nebulization, EVERY 4 HOURS  Qty:  30 Vial  Refills:  0     fluticasone-salmeterol 500-50 mcg/dose Disk with Device oral diskus inhaler  Commonly known as:  ADVAIR  What changed:  Another medication with the same name was removed. Continue taking this medication, and follow the directions you see here.   1 INHALATION, Inhalation, 2 TIMES DAILY  Qty:  1 Inhaler  Refills:  2         * This list has 4 medication(s) that are the same as other medications prescribed for you. Read the directions carefully, and ask your doctor or other care provider to review them with you.            CONTINUE these medications - NO CHANGES were made during your visit.      Details   amLODIPine 10 mg Tablet  Commonly known as:  NORVASC   TAKE 1 TABLET BY MOUTH ONCE DAILY  Qty:  30 Tab  Refills:  0     aspirin 81 mg Tablet, Chewable   81 mg, Oral, DAILY  Refills:  0     atorvastatin 40 mg Tablet  Commonly known as:  LIPITOR   40 mg, Oral, DAILY  Qty:  90 Tab  Refills:  4     pioglitazone 15 mg Tablet  Commonly known as:  ACTOS   15 mg, Oral, DAILY  Qty:  90 Tab  Refills:  1            Medications the patient is discharged on have been reviewed with the patient, including any drug/dose changes, frequency and timing of administration, side effects to be expected, and proper administration and  compliance.      Comments: Discussed discharge medications with emphasis on antibiotics and steroids, diabetes control and physician follow-ups.  Patient and significant other demonstrated understanding and all questions answered appropriately.    Coralie Carpen, [place]   05/24/2017, 11:19

## 2017-05-24 NOTE — Nurses Notes (Signed)
Patient discharged home with family.  AVS reviewed with patient.  A written copy of the AVS and discharge instructions was given to the patient.  Questions sufficiently answered as needed.  Patient encouraged to follow up with PCP as indicated.  In the event of an emergency, patient instructed to call 911 or go to the nearest emergency room. Pt had portable O2  Tank delivered to her from [employer]. Pt's IV removed and pressure held until no s/s of bleeding. Pt taken via wheelchair to main lobby.

## 2017-05-24 NOTE — Care Plan (Signed)
CM Update:  TC from primary nurse Apolonio Schneiders.  Pt being discharged by Dr. Marta Antu but needs portable oxygen to get home.  Pt oxygen supplier is [employer].  Pt indicated she wants to have a portable oxygen concentrator.  Pt was provided information at 11/12/15 discharge regarding this same issue as she mentioned the same concerns at discharge.  The CM explained what the pt needed to do to obtain a more user-friendly portable system versus tanks.  This CM contacted [employer] & spoke with [place].  Her records indicated that they contacted the pt after that hospital visit & that the pt has not ordered any portable tanks since before that time.  Pt has been their customer since 2014 & uses B Tanks which weigh approximately 2# for portables.  Kenney Houseman will have portability delivered to pt bedside room 631 for pt to return home.      Orders pended for initiated portable oxygen concentrator.  Text page to Dr. Marta Antu for signature.  Request sent via [employer] to [place].      CM met with pt & her spouse at bedside room 631.  [employer] representative present & delivered large portable tank of oxygen & explained to pt what needs to be done to qualify for the type of system she would like.  CM updated primary nurse Apolonio Schneiders.  Pt will received dose of IV antibiotics & be discharged to home.

## 2017-05-24 NOTE — Progress Notes (Signed)
[facility]  [place], [place] [address]    IP PROGRESS NOTE      [name], [name]  Date of Admission:  05/21/2017  Date of Birth:  12-22-1955  Date of Service:  05/24/2017    Chief Complaint:  Patient was admitted with acute COPD exacerbation and pneumonia.  She is a chronic smoker.      Subjective:  She was on BiPAP in the ED for a short time, now on nasal cannula.  Feels much better.  Shortness of breath is improving and denies any chest pain.    She does not have a portable oxygen tank.  Home O2 assessment is done.  This will be arranged, based on her low oxygen levels documented by nurse.      Vital Signs:  Temp (24hrs) Max:36.7 C (98.1 F)      Temperature: 36.6 C (97.8 F)  BP (Non-Invasive): (!) 149/76  MAP (Non-Invasive): 95 mmHG  Heart Rate: (!) 103  Respiratory Rate: 20  Pain Score (Numeric, Faces): 0  SpO2: 99 %    Current Medications:    Current Facility-Administered Medications:  acetaminophen (TYLENOL) tablet 650 mg Oral Q6H PRN   albuterol (PROVENTIL) 2.5mg / 0.5 mL nebulizer solution 2.5 mg Nebulization Q4H   And      ipratropium (ATROVENT) 0.02% nebulizer solution 0.5 mg Nebulization Q4H   albuterol (VENTOLIN) continuous nebulization 10 mg/hr Continuous Nebulization Continuous   amLODIPine (NORVASC) tablet 10 mg Oral Daily   aspirin chewable tablet 81 mg 81 mg Oral Daily   atorvastatin (LIPITOR) tablet 40 mg Oral Daily   azithromycin (ZITHROMAX) 500 mg in NS 250 mL IVPB 500 mg Intravenous Q24H   budesonide-formoterol (SYMBICORT) 160 mcg-4.5 mcg per inhalation oral inhaler - "Respiratory to administer" 1 Puff Inhalation 2x/day   cefTRIAXone (ROCEPHIN) 1 g in iso-osmotic 50 mL premix IVPB 1 g Intravenous Q24H   SSIP insulin lispro (HUMALOG) 100 units/mL injection  Subcutaneous 4x/day AC   And      dextrose 50% (0.5 g/mL) injection - syringe 12.5 g Intravenous Q15 Min PRN   enoxaparin (LOVENOX) 40 mg/0.4 mL SubQ injection 40 mg Subcutaneous Daily   insulin glargine (LANTUS) 100  units/mL injection 20 Units Subcutaneous Daily   insulin glargine (LANTUS) 100 units/mL injection 10 Units Subcutaneous NIGHTLY   lisinopril (PRINIVIL) tablet 5 mg Oral Daily   morphine 4 mg/mL injection 4 mg Intravenous Q5 Min PRN   nitroGLYCERIN (NITROSTAT) sublingual tablet 0.4 mg Sublingual Q5 Min PRN   NS flush syringe 10 mL Intravenous Q8HRS   NS flush syringe 10 mL Intravenous Q1H PRN   pioglitazone (ACTOS) tablet 15 mg Oral Daily   predniSONE (DELTASONE) tablet 40 mg Oral Daily with Breakfast   raNITIdine (ZANTAC) tablet 150 mg Oral 2x/day       Today's Physical Exam:    GENERAL: Patient is alert, awake and oriented X 3. In no cardio respiratory distress. Has a large more on her upper eyelid on right side. She is obese and is chronically ill appearing.   HEENT: PERRLA, EOMI.   NECK: Supple, NO JVD.   HEART: S1+, S2+, rate controlled and rhythm regular. No murmurs appreciated.   LUNGS: Bilateral breath sounds coarse, has ronchi and expiratory wheezing. Wheezing is better.   ABDOMEN: Soft, NT, ND with NABS.   EXTREMITIES: No edema, pedal pulses palpable.   NEURO: Motor exam is non focal.   SKIN: No rashes or bruises.   PSYCH: Patient is awake, oriented X  3,  with no signs of depression.      I/O:  I/O last 24 hours:      Intake/Output Summary (Last 24 hours) at 05/24/2017 1013  Last data filed at 05/24/2017 0458  Gross per 24 hour   Intake 1440 ml   Output --   Net 1440 ml     I/O current shift:  No intake/output data recorded.    Nutrition/Residuals:  DIET CARDIAC    Labs  -   Reviewed    Reviewed:   Lab Results for Last 24 Hours:    Results for orders placed or performed during the hospital encounter of 05/21/17 (from the past 24 hour(s))   POC FINGERSTICK GLUCOSE - [facility] (RESULTS)   Result Value Ref Range    GLUCOSE, POC 262 (H) 60 - 100 mg/dl   POC FINGERSTICK GLUCOSE - [facility] (RESULTS)   Result Value Ref Range    GLUCOSE, POC 320 (H) 60 - 100 mg/dl   POC FINGERSTICK GLUCOSE - [facility] (RESULTS)   Result  Value Ref Range    GLUCOSE, POC 305 (H) 60 - 100 mg/dl   POC FINGERSTICK GLUCOSE - [facility] (RESULTS)   Result Value Ref Range    GLUCOSE, POC 133 (H) 60 - 100 mg/dl     Ordered:      Diagnostic Tests - reviewed      Radiology Tests -  Reviewed        Problem List:  Active Hospital Problems   (*Primary Problem)    Diagnosis   . *COPD with acute exacerbation (CMS HCC)   . Tobacco use disorder   . Acute respiratory failure (CMS HCC)   . Hypertension due to endocrine disorder   . Diabetes (CMS [place])   . Obesity (BMI 35.0-39.9 without comorbidity)       Assessment/ Plan:       Acute respiratory failure with mild hypercapnia and hypoxia.  Patient has acute worsening of her hypoxia on her chronic underlying hypoxia.  She was placed on BiPAP in the ED, was given IV Solu-Medrol which is changed to PO Steroids, continue empiric antibiotics and albuterol and Atrovent treatments, she is feeling better this morning but is still wheezing.     Acute chronic obstructive pulmonary disease exacerbation.  Continue albuterol and Atrovent nebs.  Continue Symbicort off with long-acting beta agonists and inhaled steroids. Also on PO systemic steroids.     Pneumonia, likely community-acquired pneumonia because she did not use antibiotics within the past 3 months and was not admitted to the hospital or anything like that, but she did visit the emergency department briefly in March.  So will continue empiric antibiotics. Her lactate was 1.4 indicating no sepsis. Ordered sputum cultures. Follow culture date and adjust treatment accordingly.      H/O COPD, Treatment as outlined above.      Tobacco dependence.  She smokes cigarettes, actively smoking.  I have discussed with her to stop smoking.  She smokes 1 and half pack of cigarettes a day.  I reinforced this again this morning, Nicotine patch was offered.      Obesity with BMI of 33 kg/m2.  Encourage weight loss.      Uncontrolled hypertension.  Blood pressure was high when she came in.   Subsequently, it did get better but that could be due to her acute respiratory distress.  Will monitor blood pressure closely in house.  Adjust her antihypertensives as needed.  Continue her amlodipine.  Hyperlipidemia.  Continue Lipitor.      Uncontrolled type 2 diabetes mellitus.  Blood sugars were in the 300 range after she received the Solu-Medrol.  Continue Actos.  Follow fingersticks and cover with sliding scale.  She has Metformin. Allergy / intolerance reported as diarrhea, but then she could benefit from other oral diabetic medications.  I will see if I can add an oral diabetic medication like Amaryl. Lantus was started due to high blood sugars    Chronic respiratory with failure with hypoxia, on home O2.  Portable home O2 tank being arranged.    PLAN:      Current care plan was discussed with her and she is agreeable.     CODE STATUS:  Full code.    On DVT and GI prophylaxis with Lovenox and Zantac respectively.      For discharge to home today.      Marge Duncans, MD 05/24/2017, 10:13

## 2017-05-24 NOTE — Discharge Summary (Signed)
[facility]    DISCHARGE SUMMARY      PATIENT NAME:  [name], [name]  MRN:  [id number]  DOB:  [birth year]    ENCOUNTER START DATE:  05/21/2017  INPATIENT ADMISSION DATE: 05/21/2017  DISCHARGE DATE:  05/24/2017    ATTENDING PHYSICIAN: Marge Duncans, MD    PRIMARY CARE PHYSICIAN: Hubbard Robinson, DO     ADMISSION DIAGNOSIS: COPD with acute exacerbation (CMS [facility])  Chief Complaint   Patient presents with   . Respiratory Distress       DISCHARGE DIAGNOSIS:     Principal Problem:  COPD with acute exacerbation (CMS [facility])    Active Hospital Problems    Diagnosis Date Noted   . Principle Problem: COPD with acute exacerbation (CMS HCC) 05/23/2017   . Tobacco use disorder 05/22/2017   . Acute respiratory failure (CMS HCC) 05/21/2017   . 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 Diabetes (CMS [place]) 11/18/2015   . Obesity (BMI 35.0-39.9 without comorbidity) 10/08/2012      Resolved Hospital Problems   No resolved problems to display.     Active Non-Hospital Problems    Diagnosis Date Noted   . Non-compliance 03/27/2017      Allergies   Allergen Reactions   . Codeine  Other Adverse Reaction (Add comment)     Sick to stomach   . Hydrocodone Nausea/ Vomiting   . Metformin Diarrhea   . Oxycodone Nausea/ Vomiting            DISCHARGE MEDICATIONS:     Current Discharge Medication List      START taking these medications.      Details   cefuroxime 500 mg Tablet  Commonly known as:  CEFTIN   500 mg, Oral, 2 TIMES DAILY  Qty:  8 Tab  Refills:  0     glimepiride 2 mg Tablet  Commonly known as:  AMARYL   2 mg, Oral, EVERY MORNING  Qty:  30 Tab  Refills:  0     lisinopril 5 mg Tablet  Commonly known as:  PRINIVIL  Start taking on:  05/25/2017   5 mg, Oral, DAILY  Qty:  30 Tab  Refills:  0     predniSONE 10 mg Tablet  Commonly known as:  DELTASONE   10 mg, Oral, DAILY, Take 30 mg daily for 2 days.  Then take 20 mg daily for 3 days.  Lastly take 10 mg daily for 3 days and stop  Qty:  30 Tab  Refills:  0     raNITIdine 150 mg  Tablet  Commonly known as:  ZANTAC   150 mg, Oral, 2 TIMES DAILY  Qty:  60 Tab  Refills:  0        CONTINUE these medications which have CHANGED during your visit.      Details   * albuterol 2.5 mg/0.5 mL Solution for Nebulization  Commonly known as:  PROVENTIL  What changed:  Another medication with the same name was added. Make sure you understand how and when to take each.   2.5 mg, Nebulization, 4 TIMES DAILY  Qty:  50 Each  Refills:  0     * albuterol 2.5 mg/0.5 mL Solution for Nebulization  Commonly known as:  PROVENTIL  What changed:  You were already taking a medication with the same name, and this prescription was added. Make sure you understand how and when to take each.   2.5 mg, Nebulization, EVERY 4 HOURS  Qty:  30 Each  Refills:  0     * ATROVENT HFA 17 mcg/actuation HFA Aerosol Inhaler oral inhaler  Generic drug:  ipratropium bromide  What changed:  Another medication with the same name was added. Make sure you understand how and when to take each.   INHALE 2 PUFFS BY MOUTH 4 TIMES DAILY  Qty:  1 Inhaler  Refills:  2     * ipratropium 0.02 % Solution  Commonly known as:  ATROVENT  What changed:  You were already taking a medication with the same name, and this prescription was added. Make sure you understand how and when to take each.   0.5 mg, Nebulization, EVERY 4 HOURS  Qty:  30 Vial  Refills:  0     fluticasone-salmeterol 500-50 mcg/dose Disk with Device oral diskus inhaler  Commonly known as:  ADVAIR  What changed:  Another medication with the same name was removed. Continue taking this medication, and follow the directions you see here.   1 INHALATION, Inhalation, 2 TIMES DAILY  Qty:  1 Inhaler  Refills:  2         * This list has 4 medication(s) that are the same as other medications prescribed for you. Read the directions carefully, and ask your doctor or other care provider to review them with you.            CONTINUE these medications - NO CHANGES were made during your visit.      Details    amLODIPine 10 mg Tablet  Commonly known as:  NORVASC   TAKE 1 TABLET BY MOUTH ONCE DAILY  Qty:  30 Tab  Refills:  0     aspirin 81 mg Tablet, Chewable   81 mg, Oral, DAILY  Refills:  0     atorvastatin 40 mg Tablet  Commonly known as:  LIPITOR   40 mg, Oral, DAILY  Qty:  90 Tab  Refills:  4     pioglitazone 15 mg Tablet  Commonly known as:  ACTOS   15 mg, Oral, DAILY  Qty:  90 Tab  Refills:  1            DISCHARGE INSTRUCTIONS:      DISCHARGE INSTRUCTION - DIET     Diet: DIABETIC DIET      DME - HOME OXYGEN    Study used to evaluate oxygen saturation at rest vs exercise was performed during an inpatient hospital stay within 2 days prior to discharge date and was the last test performed prior to discharge and met the following criteria:   Study performed at rest without oxygen.  05/24/2017  @ 1000  Study performed during exercise without oxygen.  Study performed during exercise with oxygen applied that demonstrates improvement.    Results of oxygen trials:  RA rest:  89%                                       RA activity: 78%                                       RA activity with 2 lpm nasal cannula 92%     Ht 160 cm    Wt 91.6 kg    Current Attending: Dr. Marge Duncans    I  certify this pt is under my care as an attending physician & that I, or a collaborating ANP/PA had a face to face encounter with the pt or the durable medical power of attorney, as appropriate and explained the need for DME on the following date: 05/24/2017    The primary diagnosis as discussed in the face to face encounter justifying the need for home health services/DME is as follows: Acute Respiratory Failure    Liter flow per minute 2    RA O2 Sats at rest: 89    RA O2 Sats at Rest Date: 05/24/2017    RA O2 Sats at Rest Time: 47:42 AM    I certify that home oxygen is necessary due to the following condition At rest, O2 sat is >89% on room air but during exercise O2 sat is <88% and O2 administration improves the hypoxemia    Physician has  determined that patient has a severe lung disease or hypoxia related symptoms that will improve with oxygen Yes    Start Date 05/24/2017 has been on home oxygen since 2014   Type of Concentrator OXYGEN CONSERVING DEVICE 2 liter pulse dose   Type of Concentrator PORTABLE OXYGEN    RA O2 sats with ambulation (if sats 89% or greater @ rest) 78    O2 sats with ambulation AND oxygen at liter flow (if sats 89% or greater @ rest): 92    Oxygen use will be continuous? Yes    Patient is mobile within the home and will require portable oxygen: Yes    Estimated Length of Need (in months; 99 mo.= lifetime) 99    Delivery Device NASAL CANNULA    Type of Oxygen Requested GAS      Follow-up Information     Hubbard Robinson, DO. Go on 05/31/2017.    Specialties:  [place]  Why:  scheduled hospital follow up appoitntment at 9:30am  Contact information:  [address]  [place] [address]  [phone]                   REASON FOR HOSPITALIZATION AND HOSPITAL COURSE:  This is a 62 y.o., female who presented with COPD exacerbation and community-acquired pneumonia.  She did not meet sepsis criteria as her lactate was, less than 2 on admission.    Initially she required BiPAP in the emergency department but improved with treatment in the ED.  The following is a brief hospital course.  Please see history physical for further details.      SIGNIFICANT PHYSICAL FINDINGS:  Per my progress note today.    SIGNIFICANT LAB:  Blood culture showed no growth, no sputum culture available.    SIGNIFICANT RADIOLOGY:   Study Result     RADIOLOGIST: Lars Masson, MD    EXAMINATION: XR CHEST AP PORTABLE     EXAM DATE/TIME: 05/21/2017 8:58 AM    CLINICAL INDICATION: SOB    COMPARISON: None.    FINDINGS:     There are subtle infiltrates in the lower lungs bilaterally. There are no  pleural effusions.    IMPRESSION:    Subtle infiltrates in the lower lungs bilaterally       CONSULTATIONS:   None  PROCEDURES PERFORMED:  None         COURSE IN HOSPITAL:     Acute respiratory failure with mild hypercapnia and hypoxia. Patient has acute worsening of her hypoxia on her chronic underlying hypoxia. She was placed  on BiPAP in the ED, was given IV Solu-Medrol which is changed to PO Steroids, continue empiric antibiotics and albuterol and Atrovent treatments, she is feeling better this morning but is still wheezing.     Acute chronic obstructive pulmonary disease exacerbation. Continue albuterol and Atrovent nebs. Continue Symbicort off with long-acting beta agonists and inhaled steroids. Also on PO systemic steroids.     Pneumonia, likely community-acquired pneumonia because she did not use antibiotics within the past 3 months and was not admitted to the hospital or anything like that, but she did visit the emergency department briefly in March. So will continue empiric antibiotics. Her lactate was 1.4 indicating no sepsis. Ordered sputum cultures. Follow culture date and adjust treatment accordingly.     H/O COPD, Treatment as outlined above.     Tobacco dependence. She smokes cigarettes, actively smoking. I have discussed with her to stop smoking. She smokes 1 and half pack of cigarettes a day. I reinforced this again this morning, Nicotine patch was offered.     Obesity with BMI of 33 kg/m2. Encourage weight loss.     Uncontrolled hypertension. Blood pressure was high when she came in. Subsequently, it did get better but that could be due to her acute respiratory distress. Will monitor blood pressure closely in house. Adjust her antihypertensives as needed. Continue her amlodipine.     Hyperlipidemia. Continue Lipitor.     Uncontrolled type 2 diabetes mellitus. Blood sugars were in the 300 range after she received the Solu-Medrol. Continue Actos. Follow fingersticks and cover with sliding scale. She has Metformin. Allergy /intolerancereported as diarrhea, but then she  could benefit from other oral diabetic medications. I will see if I can add an oral diabetic medication like Amaryl. Lantus was started due to high blood sugars    Chronic respiratory with failure with hypoxia, on home O2.  Portable home O2 tank being arranged.    PLAN:     Current care plan was discussed with her and she is agreeable.     CODE STATUS: Full code.    On DVT and GI prophylaxis with Lovenox and Zantac respectively.      For discharge to home today.    DOES PATIENT HAVE ADVANCED DIRECTIVES:  No, Information Offered and Refused    ADVANCED CARE PLANNING - code status is full code.    CONDITION ON DISCHARGE: Alert, Oriented and VS Stable    DISCHARGE DISPOSITION:  Home discharge      Time spent with discharge preparation is 40 min.      Copies sent to Care Team       Relationship Specialty Notifications Start End    Hubbard Robinson, DO PCP - General FAMILY PRACTICE  11/18/15     Phone: [phone] Fax: [phone]         [address] [place] [address]

## 2017-05-24 NOTE — Care Plan (Signed)
Patient alert and oriented.  Ambulating independently.  HRR.  2+ edema to BLE.  Lung sounds are coarse and diminished.  DOE.  MNPC.  Denies pain.    Problem: Adult Inpatient Plan of Care  Goal: Plan of Care Review  Outcome: Ongoing (see interventions/notes)  Goal: Patient-Specific Goal (Individualization)  Outcome: Ongoing (see interventions/notes)  Goal: Absence of Hospital-Acquired Illness or Injury  Outcome: Ongoing (see interventions/notes)  Goal: Optimal Comfort and Wellbeing  Outcome: Ongoing (see interventions/notes)  Goal: Rounds/Family Conference  Outcome: Ongoing (see interventions/notes)     Problem: Depression  Goal: Improved Mood  Outcome: Ongoing (see interventions/notes)     Problem: Fall Injury Risk  Goal: Absence of Fall and Fall-Related Injury  Outcome: Ongoing (see interventions/notes)     Problem: ARDS (Acute Respiratory Distress Syndrome)  Goal: Effective Oxygenation  Outcome: Ongoing (see interventions/notes)

## 2017-05-24 NOTE — Care Management Notes (Signed)
05/24/17 1100   Assessment Detail   Assessment Type Continued Assessment   Date of Care Management Update 05/24/17   Social Work Plan   Discharge Planning Status discharge plan complete   Projected Discharge Date 05/24/17   Anticipated Discharge Disposition Home   Facility or [place] with portability   Patient/Family in Agreement with Plan yes   Discharge Needs Assessment   Equipment Currently Used at Home nebulizer;oxygen   Referral [place] of Information Patient   Record Reviewed clinical discipline documentation;medical record;plan of care   CM Update:  TC from primary nurse Apolonio Schneiders.  Pt being discharged by Dr. Marta Antu but needs portable oxygen to get home.  Pt oxygen supplier is [employer].  Pt indicated she wants to have a portable oxygen concentrator.  Pt was provided information at 11/12/15 discharge regarding this same issue as she mentioned the same concerns at discharge.  The CM explained what the pt needed to do to obtain a more user-friendly portable system versus tanks.  This CM contacted [facility] [facility] & spoke with [place].  Her records indicated that they contacted the pt after that hospital visit & that the pt has not ordered any portable tanks since before that time.  Pt has been their customer since 2014 & uses B Tanks which weigh approximately 2# for portables.  Kenney Houseman will have portability delivered to pt bedside room 631 for pt to return home.      Orders pended for initiated portable oxygen concentrator.  Text page to Dr. Marta Antu for signature.  Request sent via [employer] to [place].      CM met with pt & her spouse at bedside room 631.  [employer] representative present & delivered large portable tank of oxygen & explained to pt what needs to be done to qualify for the type of system she would like.  CM updated primary nurse Apolonio Schneiders.  Pt will received dose of IV antibiotics & be discharged to home.

## 2017-05-25 ENCOUNTER — Telehealth (INDEPENDENT_AMBULATORY_CARE_PROVIDER_SITE_OTHER): Payer: Self-pay

## 2017-05-25 NOTE — Nursing Note (Signed)
05/25/17 1100   Date and Time of Coordination Contact   Date 05/25/17   Time 1115   Patient Contact   Contact made via Phone   Spoke with Patient   Call Outcome Completed   Medications   Reviewed Med Chngs/Adds/[place] Yes   Medications obtained Yes   Clinical Needs   Pt/Caregiver aware Reason for Hosp Yes, reinforced   Pt/Caregiver Aware Reasons Causing Return to [facility] Yes, reinforced   Experiencing acute symptoms No   Hospital Discharge Follow Up Appointment   Reviewed PCP Follow-up Appt Appt scheduled   Appt Scheduled Plans to attend, has transportation   Post Acute Services Ordered at Discharge   Reviewed Post-Acute Agency/Service Orders Not applicable   Additional Interventions   Total # Outreaches 1

## 2017-05-25 NOTE — Telephone Encounter (Signed)
Received voicemail from patient with a question regarding her Atrovent prescription. Attempted to call patient back, left message requesting a return call.

## 2017-05-25 NOTE — Telephone Encounter (Signed)
Spoke with pt regarding hospital discharge yesterday for COPD exacerbation and CAP. Patient reports improvement of SOB although not back to baseline. Reviewed medication changes and follow up appointment on 5/22. She denies any questions or concerns at this time.

## 2017-05-26 LAB — ADULT ROUTINE BLOOD CULTURE, SET OF 2 BOTTLES (BACTERIA AND YEAST)
BLOOD CULTURE, ROUTINE: NO GROWTH
BLOOD CULTURE, ROUTINE: NO GROWTH

## 2017-05-29 NOTE — Care Management Notes (Signed)
Referral Information  ++++++ Placed Provider #1 ++++++  Case Manager: Helen Weaver  Provider Type: DME  Provider Name: [facility] - [facility]  Address:  [address] [place] [address]  Contact: Jonathan Inigo    Phone: [phone] x  Fax:   Fax: [phone]

## 2017-05-31 ENCOUNTER — Ambulatory Visit (INDEPENDENT_AMBULATORY_CARE_PROVIDER_SITE_OTHER): Payer: BC Managed Care – PPO | Admitting: Family Medicine

## 2017-05-31 VITALS — BP 130/64 | HR 100 | Temp 98.3°F | Resp 20 | Ht 63.0 in | Wt 192.0 lb

## 2017-05-31 DIAGNOSIS — Z09 Encounter for follow-up examination after completed treatment for conditions other than malignant neoplasm: Secondary | ICD-10-CM

## 2017-05-31 DIAGNOSIS — I10 Essential (primary) hypertension: Secondary | ICD-10-CM

## 2017-05-31 DIAGNOSIS — F17210 Nicotine dependence, cigarettes, uncomplicated: Secondary | ICD-10-CM

## 2017-05-31 DIAGNOSIS — J441 Chronic obstructive pulmonary disease with (acute) exacerbation: Secondary | ICD-10-CM

## 2017-05-31 DIAGNOSIS — R0683 Snoring: Secondary | ICD-10-CM

## 2017-05-31 DIAGNOSIS — R5383 Other fatigue: Secondary | ICD-10-CM

## 2017-05-31 DIAGNOSIS — J189 Pneumonia, unspecified organism: Secondary | ICD-10-CM

## 2017-05-31 DIAGNOSIS — E119 Type 2 diabetes mellitus without complications: Secondary | ICD-10-CM

## 2017-05-31 MED ORDER — LISINOPRIL 5 MG TABLET
5.0000 mg | ORAL_TABLET | Freq: Every day | ORAL | 0 refills | Status: DC
Start: 2017-05-31 — End: 2017-07-13

## 2017-05-31 MED ORDER — SITAGLIPTIN PHOSPHATE 100 MG TABLET
100.0000 mg | ORAL_TABLET | Freq: Every day | ORAL | 3 refills | Status: DC
Start: 2017-05-31 — End: 2017-12-08
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
## 2017-05-31 NOTE — Progress Notes (Signed)
FAMILY MEDICINE CLINIC, [facility] [name]   DOB: [birth year]  Date of Service: 05/31/2017     Chief Complaint(s):   Chief Complaint   Patient presents with   . Hospital Follow Up       SUBJECTIVE:  Ms. Hughson comes in today for hospital discharge follow up for COPD exacerbation and CAP.  Admitted for a few days on BIPAP then weaned off.  Finished course of antibiotics.  States she is feeling back to baseline.  Still smoking but reports this has decreased down to 4-6 cigs per day.  States that she has home O2 that she uses as needed.  States she gets SOB at night when laying flat so sleeps in a recliner.  Does have some daytime fatigue and snores.  Has a history of not being able to afford medications and non compliance because of this.  No new concerns today or questions about medications.     ROS:  Constitutional: Denies fevers, chills, night sweats. + fatigue   Eyes: Denies change in vision. No irritation, erythema, discharge or pain.   Ears: Denies any difficulty with hearing. No ear pain, tinnitus or discharge.   Mouth/Throat: Denies any oral ulcers or other lesions. No sore throat, hoarseness or dysphagia.  Cardiovascular: Denies any chest pain, palpitations, PND or DOE  Respiratory: SOB with exertion   GI: Denies any abdominal pain, nausea, vomiting, diarrhea or constipation. No melena or hematochezia.  GU: Denies any dysuria, frequency, hematuria, hesitancy. Denies nocturia or incontinence.  Skin: Denies any recent rashes or lesions.     Past Medical History:  Patient Active Problem List    Diagnosis Date Noted   . COPD with acute exacerbation (CMS HCC) 05/23/2017   . Tobacco use disorder 05/22/2017   . Acute respiratory failure (CMS HCC) 05/21/2017   . Non-compliance 03/27/2017   . 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 Diabetes (CMS [place]) 11/18/2015   . Obesity (BMI 35.0-39.9 without comorbidity) 10/08/2012        Medications:  Current Outpatient Medications   Medication Sig   . albuterol (PROVENTIL) 2.5 mg/0.5 mL Inhalation Solution for Nebulization 2.5 mg by Nebulization route Four times a day   . albuterol (PROVENTIL) 2.5 mg/0.5 mL Inhalation Solution for Nebulization 0.5 mL (2.5 mg total) by Nebulization route ONCE EVERY 4 HOURS   . amLODIPine (NORVASC) 10 mg Oral Tablet TAKE 1 TABLET BY MOUTH ONCE DAILY   . aspirin 81 mg Oral Tablet, Chewable Take 1 Tab (81 mg total) by mouth Once a day   . atorvastatin (LIPITOR) 40 mg Oral Tablet Take 1 Tab (40 mg total) by mouth Once a day   . ATROVENT HFA 17 mcg/actuation Inhalation HFA Aerosol Inhaler oral inhaler INHALE 2 PUFFS BY MOUTH 4 TIMES DAILY   . fluticasone-salmeterol (ADVAIR) 500-50 mcg/dose Inhalation Disk with Device oral diskus inhaler Take 1 INHALATION by inhalation Twice daily for 30 days   . glimepiride (AMARYL) 2 mg Oral Tablet Take 1 Tab (2 mg total) by mouth Every morning   . ipratropium (ATROVENT) 0.02 % Inhalation Solution 2.5 mL (0.5 mg total) by Nebulization route ONCE EVERY 4 HOURS   . lisinopril (PRINIVIL) 5 mg Oral Tablet Take 1 Tab (5 mg total) by mouth Once a day   . pioglitazone (ACTOS) 15 mg Oral Tablet Take 1 Tab (15 mg total) by mouth Once a day  for 90 days   . raNITIdine (ZANTAC) 150 mg Oral Tablet Take 1 Tab (150 mg total) by mouth Twice daily   . sitaGLIPtin (JANUVIA) 100 mg Oral Tablet Take 1 Tab (100 mg total) by mouth Once a day        Allergies:  Allergies   Allergen Reactions   . Codeine  Other Adverse Reaction (Add comment)     Sick to stomach   . Hydrocodone Nausea/ Vomiting   . Metformin Diarrhea   . Oxycodone Nausea/ Vomiting        Social History:  Social History     Tobacco Use   . Smoking status: Current Every Day Smoker     Packs/day: 1.50     Years: 20.00     Pack years: 30.00     Types: Cigarettes   . Smokeless tobacco: Never Used   Substance Use Topics   . Alcohol use: No   . Drug use: No          OBJECTIVE:  BP 130/64    Pulse 100   Temp 36.8 C (98.3 F) (Oral)   Resp 20   Ht 1.6 m (5\' 3" )   Wt 87.1 kg (192 lb)   LMP  (LMP Unknown)   SpO2 91%   BMI 34.01 kg/m          Physical Exam:  General: No apparent acute distress. Very pleasant.   Eyes: Conjunctiva are clear. No discharge or icterus noted.  HENT: Mucous membranes are moist. Nares are clear. Posterior oropharynx is clear without erythema or exudates.    Neck: No cervical lymphadenopathy.   Lungs: Clear to auscultation bilaterally. Distant breath sounds.   Cardiovascular: Normal rate and regular rhythm. No murmurs, rubs or gallops.  Abdomen: Soft. BS present. Non-tender. No rebound, guarding, or peritoneal signs.   Extremities: Atraumatic. No cyanosis. No significant peripheral edema.  Skin: Warm and dry.  No significant rashes or lesions    ASSESSMENT and PLAN:  1. Hospital discharge follow-up -  Went over medications with her, has O2 as needed.  Will order home sleep study to evaluate for sleep apnea.  Back to baseline, O2 today was 91% which is around her average.  Can follow up if worsening.    2. Fatigue, unspecified type    3. Snoring    4. Diabetes (CMS [place]) -  Continue current diabetes regimen, has follow up in a few months for recheck on this.    5. Hypertension, unspecified type -  Continue Lisinopril and Norvasc.     6. COPD with acute exacerbation (CMS [place])                     Hubbard Robinson, DO 05/31/2017, 09:58        Visit Diagnoses and associated Orders -- Summary:        ICD-10-CM    1. Hospital discharge follow-up Z09    2. Fatigue, unspecified type R53.83 POLYSOMNOGRAPHY - SLEEP STUDY - UNATTENDED   3. Snoring R06.83 POLYSOMNOGRAPHY - SLEEP STUDY - UNATTENDED   4. Diabetes (CMS HCC) E11.9 sitaGLIPtin (JANUVIA) 100 mg Oral Tablet   5. Hypertension, unspecified type I10 lisinopril (PRINIVIL) 5 mg Oral Tablet   6. COPD with acute exacerbation (CMS HCC) J44.1          Portions of this note may be dictated using voice recognition software.   Variances in  spelling and vocabulary are possible and unintentional. Not all errors are caught/corrected.  Please notify the [facility] if any discrepancies are noted or if the meaning of any statement is not clear.

## 2017-07-13 ENCOUNTER — Other Ambulatory Visit (INDEPENDENT_AMBULATORY_CARE_PROVIDER_SITE_OTHER): Payer: Self-pay | Admitting: Family Medicine

## 2017-07-13 DIAGNOSIS — I10 Essential (primary) hypertension: Secondary | ICD-10-CM

## 2017-07-14 ENCOUNTER — Other Ambulatory Visit (INDEPENDENT_AMBULATORY_CARE_PROVIDER_SITE_OTHER): Payer: Self-pay | Admitting: Family Medicine

## 2017-07-14 ENCOUNTER — Ambulatory Visit (HOSPITAL_BASED_OUTPATIENT_CLINIC_OR_DEPARTMENT_OTHER)
Admission: RE | Admit: 2017-07-14 | Discharge: 2017-07-14 | Disposition: A | Discharge status: Home / Self Care | Payer: BC Managed Care – PPO | Source: Ambulatory Visit | Attending: Family Medicine | Admitting: Family Medicine

## 2017-07-14 DIAGNOSIS — R0683 Snoring: Secondary | ICD-10-CM | POA: Insufficient documentation

## 2017-07-14 DIAGNOSIS — G4733 Obstructive sleep apnea (adult) (pediatric): Secondary | ICD-10-CM | POA: Insufficient documentation

## 2017-07-14 DIAGNOSIS — R5383 Other fatigue: Secondary | ICD-10-CM | POA: Insufficient documentation

## 2017-07-14 MED ORDER — ALBUTEROL SULFATE CONCENTRATE 2.5 MG/0.5 ML SOLUTION FOR NEBULIZATION
2.50 mg | INHALATION_SOLUTION | RESPIRATORY_TRACT | 2 refills | Status: DC
Start: 2017-07-14 — End: 2018-09-24

## 2017-07-14 MED ORDER — IPRATROPIUM BROMIDE 0.02 % SOLUTION FOR INHALATION
0.50 mg | RESPIRATORY_TRACT | 2 refills | Status: DC
Start: 2017-07-14 — End: 2019-06-21

## 2017-07-14 NOTE — Telephone Encounter (Signed)
Received fax refill request for nebs.    Maye Hides, LPN  04/17/5925, 63:94

## 2017-07-17 ENCOUNTER — Telehealth (INDEPENDENT_AMBULATORY_CARE_PROVIDER_SITE_OTHER): Payer: Self-pay | Admitting: Family Medicine

## 2017-07-17 NOTE — Telephone Encounter (Signed)
Pt called stating that she is having pain in both legs and feet and would like a script for gabapentin.This pt need appt? [place], [place]  07/17/2017, 11:17

## 2017-07-18 NOTE — Telephone Encounter (Signed)
Yes we would have to discuss the gabapentin and possible side effects and such as well as physical exam.    Hubbard Robinson, DO  07/18/2017, 15:07

## 2017-07-18 NOTE — Telephone Encounter (Signed)
Patient called today asking status of gabapentin advised Dr. Sabra Heck is Out of Office on Tuesdays' will be reviewed when he returns. Patient aware an appt may be needed     [name]  07/18/2017, 13:57

## 2017-07-18 NOTE — Procedures (Signed)
[name], [name]  HOSPITAL NUMBER:  [id number]  DATE OF SERVICE:  07/14/2017  DOB:  February 10, 1955  SEX:  F      REFERRING PHYSICIAN:  Hubbard Robinson, DO    INDICATIONS FOR TESTING:  Snoring and excessive daytime sleepiness.    DESCRIPTION OF PROCEDURE:  The patient was studied using the unattended [facility] system on July 14, 2017, for 9 hours and 45 minutes, 6 hours and 7 minutes of which were asleep.  The patient had a sleep onset of 36 minutes and a sleep efficiency of 63%.  Analysis of sleep architecture demonstrated 20% REM, 69% light, and 10% deep sleep. The patient had occasional episodes of sleep-disordered breathing. The Apnea-Hypopnea Index averaged for the night was 2.5 and during REM sleep was 8.0. The minimum oxygen saturation was 90%.    IMPRESSION:  1. Sleep-disordered breathing.  2. Obstructive sleep apnea, REM related, mild.    RECOMMENDATION:  CPAP titration study if this is indicated clinically.  Alternatively, an oral appliance fitted by a certified practitioner is an approved treatment for mild or moderate sleep-disordered breathing and apnea.        Merleen Milliner, MD          CC:   Hubbard Robinson, DO   Fax: [phone]       DD:  07/18/2017 15:03:03  DT:  07/18/2017 15:19:20 BW  D#:  [id number]

## 2017-07-19 NOTE — Progress Notes (Signed)
Mild Sleep Apnea.    Melinda Robinson, DO  07/19/2017, 13:08

## 2017-08-03 ENCOUNTER — Encounter (INDEPENDENT_AMBULATORY_CARE_PROVIDER_SITE_OTHER): Payer: Self-pay | Admitting: Family Medicine

## 2017-08-14 ENCOUNTER — Other Ambulatory Visit (INDEPENDENT_AMBULATORY_CARE_PROVIDER_SITE_OTHER): Payer: Self-pay | Admitting: Family Medicine

## 2017-08-14 DIAGNOSIS — J449 Chronic obstructive pulmonary disease, unspecified: Secondary | ICD-10-CM

## 2017-08-14 DIAGNOSIS — I10 Essential (primary) hypertension: Secondary | ICD-10-CM

## 2017-08-16 ENCOUNTER — Telehealth (INDEPENDENT_AMBULATORY_CARE_PROVIDER_SITE_OTHER): Payer: Self-pay | Admitting: Family Medicine
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
## 2017-08-21 NOTE — Nursing Note (Signed)
[place], they are faxing over the form. They need signed form back by 08/26/17.    Maye Hides, LPN  0/98/1191, 47:82

## 2017-09-16 ENCOUNTER — Other Ambulatory Visit (INDEPENDENT_AMBULATORY_CARE_PROVIDER_SITE_OTHER): Payer: Self-pay | Admitting: Family Medicine

## 2017-09-16 DIAGNOSIS — E119 Type 2 diabetes mellitus without complications: Secondary | ICD-10-CM

## 2017-09-16 DIAGNOSIS — I10 Essential (primary) hypertension: Secondary | ICD-10-CM

## 2017-09-16 DIAGNOSIS — I152 Hypertension secondary to endocrine disorders: Secondary | ICD-10-CM

## 2017-09-18 NOTE — Telephone Encounter (Signed)
Refill request for amlodipine, glimepiride, and lisinopril   [name], [place]  09/18/2017, 10:57

## 2017-09-27 ENCOUNTER — Encounter (INDEPENDENT_AMBULATORY_CARE_PROVIDER_SITE_OTHER): Payer: Self-pay | Admitting: Family Medicine

## 2017-10-20 ENCOUNTER — Encounter (INDEPENDENT_AMBULATORY_CARE_PROVIDER_SITE_OTHER): Payer: Self-pay | Admitting: Family Medicine

## 2017-10-20 ENCOUNTER — Ambulatory Visit (INDEPENDENT_AMBULATORY_CARE_PROVIDER_SITE_OTHER): Payer: BC Managed Care – PPO | Admitting: Family Medicine

## 2017-10-20 VITALS — BP 158/72 | HR 108 | Temp 98.5°F | Ht 63.0 in | Wt 203.6 lb

## 2017-10-20 DIAGNOSIS — Z794 Long term (current) use of insulin: Secondary | ICD-10-CM

## 2017-10-20 DIAGNOSIS — J441 Chronic obstructive pulmonary disease with (acute) exacerbation: Secondary | ICD-10-CM

## 2017-10-20 DIAGNOSIS — Z6836 Body mass index (BMI) 36.0-36.9, adult: Secondary | ICD-10-CM

## 2017-10-20 DIAGNOSIS — F17210 Nicotine dependence, cigarettes, uncomplicated: Secondary | ICD-10-CM

## 2017-10-20 DIAGNOSIS — Z23 Encounter for immunization: Secondary | ICD-10-CM

## 2017-10-20 DIAGNOSIS — E119 Type 2 diabetes mellitus without complications: Secondary | ICD-10-CM

## 2017-10-20 LAB — POCT EAST HGB A1C (AMB): POCT HGB A1C: 6.4 % — AB (ref 4–6)

## 2017-10-20 MED ORDER — DOXYCYCLINE HYCLATE 100 MG CAPSULE
100.00 mg | ORAL_CAPSULE | Freq: Two times a day (BID) | ORAL | 0 refills | Status: AC
Start: 2017-10-20 — End: 2017-10-30

## 2017-10-20 NOTE — Nursing Note (Signed)
Chief Complaint:   Chief Complaint            Diabetes Follow up         Functional Health Screen        Vital Signs  BP (!) 158/72   Pulse (!) 108   Temp 36.9 C (98.5 F) (Oral)   Ht 1.6 m (5\' 3" )   Wt 92.4 kg (203 lb 9.6 oz)   LMP  (LMP Unknown)   SpO2 (!) 88%   BMI 36.07 kg/m       Social History     Tobacco Use   Smoking Status Current Every Day Smoker   . Packs/day: 1.00   . Years: 20.00   . Pack years: 20.00   . Types: Cigarettes   Smokeless Tobacco Never Used     Allergies  Allergies   Allergen Reactions   . Codeine  Other Adverse Reaction (Add comment)     Sick to stomach   . Hydrocodone Nausea/ Vomiting   . Metformin Diarrhea   . Oxycodone Nausea/ Vomiting     Medication History  Reviewed for OTC medication and any new medications, provider will review medication history  Care Team  Patient Care Team:  Hubbard Robinson, DO as PCP - General ([place])  Immunizations - last 24 hours     None        [name], [place]  10/20/2017, 11:10

## 2017-10-20 NOTE — Nursing Note (Signed)
10/20/17 1100   A1C   Time Performed 1116   A1C 6.4   References Ranges 4 - 6%   A1C Lot # [id number]   Expiration Date 05/10/19   Internal Control Valid yes   Initials alf   Lancet used on  Finger

## 2017-10-23 NOTE — Progress Notes (Signed)
[place], [place]  [place] [address]  Office Visit    ID: [name]   DOB: Sep 18, 1955  Date of Service: 10/20/2017     Chief Complaint(s):   Chief Complaint   Patient presents with   . Diabetes Follow up       SUBJECTIVE:  [name] comes in today for six month follow up on chronic medical conditions including COPD, DM, HTN.  States she has been more consistent with her diabetes medications.  In the past she was hard up for money so she did not take her medications every day.  She is still smoking.  She has not changed her diet much.  She reports she has been sick for a week.  She reports some SOB and a lot of coughing.  No fevers.  Had pneumonia earlier this year.  Has been using her nebulizers daily.     ROS:  Constitutional: Denies fevers, chills, night sweats. No recent weight changes or fatigue.   Eyes: Denies change in vision. No irritation, erythema, discharge or pain.   Ears: Denies any difficulty with hearing. No ear pain, tinnitus or discharge.   Mouth/Throat: Denies any oral ulcers or other lesions. No sore throat, hoarseness or dysphagia.  Cardiovascular: Denies any chest pain, palpitations, PND or DOE  Respiratory: + cough and SOB  GI: Denies any abdominal pain, nausea, vomiting, diarrhea or constipation. No melena or hematochezia.  GU: Denies any dysuria, frequency, hematuria, hesitancy. Denies nocturia or incontinence  Skin: Denies any recent rashes or lesions.     Past Medical History:  Patient Active Problem List    Diagnosis Date Noted   . COPD with acute exacerbation (CMS HCC) 05/23/2017   . Tobacco use disorder 05/22/2017   . Acute respiratory failure (CMS HCC) 05/21/2017   . Non-compliance 03/27/2017   . 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 Diabetes (CMS [place]) 11/18/2015   . Obesity (BMI 35.0-39.9 without comorbidity) 10/08/2012       Medications:  Current Outpatient Medications   Medication Sig   . albuterol (PROVENTIL) 2.5 mg/0.5 mL  Inhalation Solution for Nebulization 2.5 mg by Nebulization route Four times a day   . albuterol (PROVENTIL) 2.5 mg/0.5 mL Inhalation Solution for Nebulization 0.5 mL (2.5 mg total) by Nebulization route ONCE EVERY 4 HOURS   . amLODIPine (NORVASC) 10 mg Oral Tablet TAKE 1 TABLET BY MOUTH ONCE DAILY   . aspirin 81 mg Oral Tablet, Chewable Take 1 Tab (81 mg total) by mouth Once a day   . atorvastatin (LIPITOR) 40 mg Oral Tablet Take 1 Tab (40 mg total) by mouth Once a day   . ATROVENT HFA 17 mcg/actuation Inhalation HFA Aerosol Inhaler oral inhaler INHALE 2 PUFFS BY MOUTH 4 TIMES DAILY   . doxycycline hyclate (VIBRAMYCIN) 100 mg Oral Capsule Take 1 Cap (100 mg total) by mouth Twice daily for 10 days   . fluticasone-salmeterol (ADVAIR) 500-50 mcg/dose Inhalation Disk with Device oral diskus inhaler Take 1 INHALATION by inhalation Twice daily for 30 days   . glimepiride (AMARYL) 2 mg Oral Tablet Take 1 Tab (2 mg total) by mouth Every morning   . glimepiride (AMARYL) 4 mg Oral Tablet TAKE 1 TABLET BY MOUTH ONCE DAILY IN THE MORNING   . ipratropium (ATROVENT) 0.02 % Inhalation Solution 2.5 mL (0.5 mg total) by Nebulization route ONCE EVERY 4 HOURS   . lisinopril (PRINIVIL) 5 mg Oral Tablet TAKE 1  TABLET BY MOUTH ONCE DAILY   . pioglitazone (ACTOS) 15 mg Oral Tablet Take 1 Tab (15 mg total) by mouth Once a day for 90 days   . sitaGLIPtin (JANUVIA) 100 mg Oral Tablet Take 1 Tab (100 mg total) by mouth Once a day        Allergies:  Allergies   Allergen Reactions   . Codeine  Other Adverse Reaction (Add comment)     Sick to stomach   . Hydrocodone Nausea/ Vomiting   . Metformin Diarrhea   . Oxycodone Nausea/ Vomiting        Social History:  Social History     Tobacco Use   . Smoking status: Current Every Day Smoker     Packs/day: 1.00     Years: 20.00     Pack years: 20.00     Types: Cigarettes   . Smokeless tobacco: Never Used   Substance Use Topics   . Alcohol use: No   . Drug use: No          OBJECTIVE:  BP (!) 158/72    Pulse (!) 108   Temp 36.9 C (98.5 F) (Oral)   Ht 1.6 m (5\' 3" )   Wt 92.4 kg (203 lb 9.6 oz)   LMP  (LMP Unknown)   SpO2 (!) 88%   BMI 36.07 kg/m          Physical Exam:  General: No apparent acute distress. Very pleasant.   Eyes: Conjunctiva are clear. No discharge or icterus noted.  HENT: Mucous membranes are moist. Nares are clear. Posterior oropharynx is clear without erythema or exudates.    Lungs: Clear and distant breath sounds.   Cardiovascular: Normal rate and regular rhythm. No murmurs, rubs or gallops.  Abdomen: Soft. BS present. Non-tender. No rebound, guarding, or peritoneal signs.   Extremities: Atraumatic. No cyanosis. No significant peripheral edema.  Skin: Warm and dry.  No significant rashes or lesions  Neurologic: Strength and sensation grossly normal throughout.  Normal gait.    Psychiatric: Alert and oriented x 3. Affect within normal limits.    ASSESSMENT and PLAN:  1. Type 2 diabetes mellitus without complication, with long-term current use of insulin (CMS HCC) -  A1C today was excellent at 6.4 now that she is taking all of her medications daily.  Continue her three oral DM meds, needs eye exam.  Trial low carb diet and exercise.  Follow up in six months.    2. COPD exacerbation (CMS HCC) -  Had to place her on her home O2 of 2 liters to get her oxygen sat at 93%.  She was 88% on room air.  Will start doxycycline, she declined prednisone at this time, discussed if she is to worsen then report to the ED.    3. Need for vaccination -  Influenza vaccine given today.                    Hubbard Robinson, DO 10/23/2017, 19:17        Visit Diagnoses and associated Orders -- Summary:        ICD-10-CM    1. Type 2 diabetes mellitus without complication, with long-term current use of insulin (CMS HCC) E11.9 POCT EAST HGB A1C (AMB)    Z79.4    2. COPD exacerbation (CMS HCC) J44.1 doxycycline hyclate (VIBRAMYCIN) 100 mg Oral Capsule   3. Need for vaccination Z23 Influenza Vaccine IM 0.71ml Age 3 Years  and older(Adult)  Portions of this note may be dictated using voice recognition software.   Variances in spelling and vocabulary are possible and unintentional. Not all errors are caught/corrected. Please notify the [facility] if any discrepancies are noted or if the meaning of any statement is not clear.

## 2017-10-26 ENCOUNTER — Telehealth (INDEPENDENT_AMBULATORY_CARE_PROVIDER_SITE_OTHER): Payer: Self-pay | Admitting: Family Medicine

## 2017-10-26 DIAGNOSIS — R600 Localized edema: Secondary | ICD-10-CM

## 2017-10-26 MED ORDER — FUROSEMIDE 20 MG TABLET
10.0000 mg | ORAL_TABLET | Freq: Every day | ORAL | 1 refills | Status: DC
Start: 2017-10-26 — End: 2018-01-05

## 2017-10-26 NOTE — Telephone Encounter (Signed)
Lasix sent to pharmacy.    Hubbard Robinson, DO  10/26/2017, 15:31

## 2017-10-26 NOTE — Telephone Encounter (Signed)
Pt notified of med sent into the pharmacy.  [name], MA  10/26/2017, 15:59

## 2017-10-26 NOTE — Telephone Encounter (Signed)
Pt called in stating she had forgot to mention swelling in her feet and legs. from the knee down. Pt is requesting a diuretics for the swelling. If medication is prescribed would like to use [facility] [address].  [name], MA  10/26/2017, 15:28

## 2017-11-16 ENCOUNTER — Other Ambulatory Visit (INDEPENDENT_AMBULATORY_CARE_PROVIDER_SITE_OTHER): Payer: Self-pay | Admitting: Family Medicine

## 2017-11-16 DIAGNOSIS — E119 Type 2 diabetes mellitus without complications: Principal | ICD-10-CM

## 2017-11-16 DIAGNOSIS — Z794 Long term (current) use of insulin: Secondary | ICD-10-CM

## 2017-11-16 DIAGNOSIS — J449 Chronic obstructive pulmonary disease, unspecified: Secondary | ICD-10-CM

## 2017-11-16 DIAGNOSIS — I10 Essential (primary) hypertension: Secondary | ICD-10-CM

## 2017-11-16 NOTE — Telephone Encounter (Signed)
Refill Request for pioglitazone, Atrovent, lisinopril, and Advair.  [name], [place]  11/16/2017, 13:22

## 2017-12-08 ENCOUNTER — Other Ambulatory Visit (INDEPENDENT_AMBULATORY_CARE_PROVIDER_SITE_OTHER): Payer: Self-pay | Admitting: Family Medicine

## 2017-12-08 DIAGNOSIS — E119 Type 2 diabetes mellitus without complications: Secondary | ICD-10-CM

## 2017-12-11 NOTE — Telephone Encounter (Signed)
Refill request for Januvia.  Rod Can, MA  12/11/2017, 09:31

## 2017-12-17 ENCOUNTER — Other Ambulatory Visit (INDEPENDENT_AMBULATORY_CARE_PROVIDER_SITE_OTHER): Payer: Self-pay | Admitting: Family Medicine

## 2017-12-17 DIAGNOSIS — J449 Chronic obstructive pulmonary disease, unspecified: Secondary | ICD-10-CM

## 2017-12-25 ENCOUNTER — Other Ambulatory Visit (INDEPENDENT_AMBULATORY_CARE_PROVIDER_SITE_OTHER): Payer: Self-pay | Admitting: Family Medicine

## 2017-12-25 DIAGNOSIS — I10 Essential (primary) hypertension: Secondary | ICD-10-CM

## 2018-01-05 ENCOUNTER — Other Ambulatory Visit (INDEPENDENT_AMBULATORY_CARE_PROVIDER_SITE_OTHER): Payer: Self-pay | Admitting: Family Medicine

## 2018-01-05 DIAGNOSIS — R600 Localized edema: Secondary | ICD-10-CM

## 2018-01-05 NOTE — Telephone Encounter (Signed)
Refill Request for Lasix  Maurice March, MA  01/05/2018, 12:45

## 2018-01-10 DIAGNOSIS — J449 Chronic obstructive pulmonary disease, unspecified: Secondary | ICD-10-CM

## 2018-01-10 HISTORY — DX: Chronic obstructive pulmonary disease, unspecified: J44.9

## 2018-02-08 ENCOUNTER — Other Ambulatory Visit (INDEPENDENT_AMBULATORY_CARE_PROVIDER_SITE_OTHER): Payer: Self-pay | Admitting: Family Medicine

## 2018-02-08 DIAGNOSIS — R600 Localized edema: Secondary | ICD-10-CM

## 2018-02-08 NOTE — Telephone Encounter (Signed)
Refill request for Lasix.  [name], MA  02/08/2018, 10:52

## 2018-03-10 ENCOUNTER — Other Ambulatory Visit (INDEPENDENT_AMBULATORY_CARE_PROVIDER_SITE_OTHER): Payer: Self-pay | Admitting: Family Medicine

## 2018-03-10 DIAGNOSIS — R600 Localized edema: Secondary | ICD-10-CM

## 2018-03-27 ENCOUNTER — Encounter (HOSPITAL_BASED_OUTPATIENT_CLINIC_OR_DEPARTMENT_OTHER): Payer: Self-pay

## 2018-03-27 ENCOUNTER — Other Ambulatory Visit: Payer: Self-pay

## 2018-03-27 ENCOUNTER — Emergency Department (HOSPITAL_BASED_OUTPATIENT_CLINIC_OR_DEPARTMENT_OTHER): Payer: BC Managed Care – PPO

## 2018-03-27 ENCOUNTER — Observation Stay (HOSPITAL_BASED_OUTPATIENT_CLINIC_OR_DEPARTMENT_OTHER)
Admission: EM | Admit: 2018-03-27 | Discharge: 2018-03-29 | Disposition: A | Discharge status: Home / Self Care | Payer: BC Managed Care – PPO | Attending: Family | Admitting: Family

## 2018-03-27 DIAGNOSIS — Z794 Long term (current) use of insulin: Secondary | ICD-10-CM | POA: Insufficient documentation

## 2018-03-27 DIAGNOSIS — Z9981 Dependence on supplemental oxygen: Secondary | ICD-10-CM | POA: Insufficient documentation

## 2018-03-27 DIAGNOSIS — F17210 Nicotine dependence, cigarettes, uncomplicated: Secondary | ICD-10-CM | POA: Insufficient documentation

## 2018-03-27 DIAGNOSIS — Z7951 Long term (current) use of inhaled steroids: Secondary | ICD-10-CM | POA: Insufficient documentation

## 2018-03-27 DIAGNOSIS — Z8249 Family history of ischemic heart disease and other diseases of the circulatory system: Secondary | ICD-10-CM | POA: Insufficient documentation

## 2018-03-27 DIAGNOSIS — Z888 Allergy status to other drugs, medicaments and biological substances status: Secondary | ICD-10-CM | POA: Insufficient documentation

## 2018-03-27 DIAGNOSIS — M159 Polyosteoarthritis, unspecified: Secondary | ICD-10-CM | POA: Insufficient documentation

## 2018-03-27 DIAGNOSIS — J441 Chronic obstructive pulmonary disease with (acute) exacerbation: Secondary | ICD-10-CM | POA: Insufficient documentation

## 2018-03-27 DIAGNOSIS — E785 Hyperlipidemia, unspecified: Secondary | ICD-10-CM | POA: Insufficient documentation

## 2018-03-27 DIAGNOSIS — R0602 Shortness of breath: Secondary | ICD-10-CM | POA: Insufficient documentation

## 2018-03-27 DIAGNOSIS — E1165 Type 2 diabetes mellitus with hyperglycemia: Secondary | ICD-10-CM | POA: Insufficient documentation

## 2018-03-27 DIAGNOSIS — Z825 Family history of asthma and other chronic lower respiratory diseases: Secondary | ICD-10-CM | POA: Insufficient documentation

## 2018-03-27 DIAGNOSIS — Z885 Allergy status to narcotic agent status: Secondary | ICD-10-CM | POA: Insufficient documentation

## 2018-03-27 DIAGNOSIS — Z7982 Long term (current) use of aspirin: Secondary | ICD-10-CM | POA: Insufficient documentation

## 2018-03-27 DIAGNOSIS — J440 Chronic obstructive pulmonary disease with acute lower respiratory infection: Secondary | ICD-10-CM | POA: Insufficient documentation

## 2018-03-27 DIAGNOSIS — Z833 Family history of diabetes mellitus: Secondary | ICD-10-CM | POA: Insufficient documentation

## 2018-03-27 DIAGNOSIS — Z79899 Other long term (current) drug therapy: Secondary | ICD-10-CM | POA: Insufficient documentation

## 2018-03-27 DIAGNOSIS — J9601 Acute respiratory failure with hypoxia: Secondary | ICD-10-CM | POA: Insufficient documentation

## 2018-03-27 DIAGNOSIS — J189 Pneumonia, unspecified organism: Secondary | ICD-10-CM | POA: Insufficient documentation

## 2018-03-27 DIAGNOSIS — I10 Essential (primary) hypertension: Secondary | ICD-10-CM | POA: Insufficient documentation

## 2018-03-27 LAB — COMPREHENSIVE METABOLIC PROFILE - BMC/JMC ONLY
ALBUMIN/GLOBULIN RATIO: 1 (ref 0.8–2.0)
ALBUMIN: 3.6 g/dL (ref 3.5–5.0)
ALKALINE PHOSPHATASE: 99 U/L (ref 38–126)
ALKALINE PHOSPHATASE: 99 U/L (ref 38–126)
ALT (SGPT): 16 U/L (ref 14–54)
ANION GAP: 10 mmol/L (ref 3–11)
AST (SGOT): 12 U/L — ABNORMAL LOW (ref 15–41)
BILIRUBIN TOTAL: 0.6 mg/dL (ref 0.3–1.2)
BUN/CREA RATIO: 7 (ref 6–22)
BUN: 4 mg/dL — ABNORMAL LOW (ref 6–20)
CALCIUM: 9.5 mg/dL (ref 8.8–10.2)
CHLORIDE: 97 mmol/L — ABNORMAL LOW (ref 101–111)
CO2 TOTAL: 31 mmol/L (ref 22–32)
CREATININE: 0.55 mg/dL (ref 0.44–1.00)
ESTIMATED GFR: >60 mL/min/1.73mˆ2 (ref >60)
GLUCOSE: 145 mg/dL — ABNORMAL HIGH (ref 70–110)
POTASSIUM: 4.3 mmol/L (ref 3.4–5.1)
PROTEIN TOTAL: 7.3 g/dL (ref 6.4–8.3)
SODIUM: 138 mmol/L (ref 136–145)

## 2018-03-27 LAB — CBC WITH DIFF
BASOPHIL #: 0.1 x10ˆ3/uL (ref 0.00–0.10)
BASOPHIL %: 1 % (ref 0–3)
EOSINOPHIL #: 0.1 x10ˆ3/uL (ref 0.00–0.50)
EOSINOPHIL %: 1 % (ref 0–5)
HCT: 47.9 % — ABNORMAL HIGH (ref 36.0–45.0)
HGB: 15.8 g/dL — ABNORMAL HIGH (ref 12.0–15.5)
LYMPHOCYTE #: 2.8 10*3/uL (ref 1.00–4.80)
LYMPHOCYTE %: 22 % (ref 15–43)
MCH: 30 pg (ref 27.5–33.2)
MCHC: 33 g/dL (ref 32.0–36.0)
MCV: 90.9 fL (ref 82.0–97.0)
MONOCYTE #: 1.1 x10ˆ3/uL — ABNORMAL HIGH (ref 0.20–0.90)
MONOCYTE %: 9 % (ref 5–12)
MPV: 7.7 fL (ref 7.4–10.5)
NEUTROPHIL #: 8.5 x10ˆ3/uL — ABNORMAL HIGH (ref 1.50–6.50)
NEUTROPHIL %: 67 % (ref 43–76)
PLATELETS: 411 x10ˆ3/uL (ref 150–450)
RBC: 5.27 x10ˆ6/uL — ABNORMAL HIGH (ref 4.00–5.10)
RDW: 14.5 % (ref 11.0–16.0)
WBC: 12.7 x10ˆ3/uL — ABNORMAL HIGH (ref 4.0–11.0)

## 2018-03-27 LAB — B-TYPE NATRIURETIC PEPTIDE (BNP),PLASMA: BNP: 119 pg/mL — ABNORMAL HIGH (ref 0–100)

## 2018-03-27 LAB — POC FINGERSTICK GLUCOSE - BMC/JMC (RESULTS)
GLUCOSE, POC: 237 mg/dL — ABNORMAL HIGH (ref 60–100)
GLUCOSE, POC: 455 mg/dL (ref 60–100)
GLUCOSE, POC: 416 mg/dL (ref 60–100)

## 2018-03-27 LAB — TROPONIN-I: TROPONIN I: <0.03 ng/mL (ref <=0.06)

## 2018-03-27 MED ORDER — NITROGLYCERIN 0.4 MG SUBLINGUAL TABLET
0.4000 mg | SUBLINGUAL_TABLET | SUBLINGUAL | Status: DC | PRN
Start: 2018-03-27 — End: 2018-03-29

## 2018-03-27 MED ORDER — LISINOPRIL 5 MG TABLET
5.0000 mg | ORAL_TABLET | Freq: Every day | ORAL | Status: DC
Start: 2018-03-27 — End: 2018-03-29
  Administered 2018-03-27 – 2018-03-29 (×3): 5 mg via ORAL
  Filled 2018-03-27 (×3): qty 1

## 2018-03-27 MED ORDER — DEXTROSE 10 % IN WATER (D10W) BOLUS
125.0000 mL | INJECTION | INTRAVENOUS | Status: DC | PRN
Start: 2018-03-27 — End: 2018-03-29

## 2018-03-27 MED ORDER — BUDESONIDE-FORMOTEROL HFA 160 MCG-4.5 MCG/ACTUATION AEROSOL INHALER
2.00 | INHALATION_SPRAY | Freq: Two times a day (BID) | RESPIRATORY_TRACT | Status: DC
Start: 2018-03-27 — End: 2018-03-29
  Administered 2018-03-27 – 2018-03-29 (×4): 2 via RESPIRATORY_TRACT
  Filled 2018-03-27: qty 6

## 2018-03-27 MED ORDER — METHYLPREDNISOLONE SOD SUCC 125 MG SOLUTION FOR INJECTION WRAPPER
125.00 mg | INTRAVENOUS | Status: AC
Start: 2018-03-27 — End: 2018-03-27
  Administered 2018-03-27: 125 mg via INTRAVENOUS
  Filled 2018-03-27: qty 2

## 2018-03-27 MED ORDER — GLIMEPIRIDE 2 MG TABLET
2.00 mg | ORAL_TABLET | Freq: Every morning | ORAL | Status: DC
Start: 2018-03-28 — End: 2018-03-29
  Administered 2018-03-28 – 2018-03-29 (×2): 2 mg via ORAL
  Filled 2018-03-27 (×2): qty 1

## 2018-03-27 MED ORDER — ONDANSETRON HCL (PF) 4 MG/2 ML INJECTION SOLUTION
4.0000 mg | Freq: Three times a day (TID) | INTRAMUSCULAR | Status: DC | PRN
Start: 2018-03-27 — End: 2018-03-29

## 2018-03-27 MED ORDER — ALBUTEROL SULFATE CONCENTRATE 2.5 MG/0.5 ML SOLUTION FOR NEBULIZATION
2.50 mg | INHALATION_SOLUTION | RESPIRATORY_TRACT | Status: AC
Start: 2018-03-27 — End: 2018-03-27

## 2018-03-27 MED ORDER — SODIUM CHLORIDE 0.9% FLUSH BAG - 250 ML
INTRAVENOUS | Status: DC | PRN
Start: 2018-03-27 — End: 2018-03-29

## 2018-03-27 MED ORDER — INSULIN LISPRO 100 UNIT/ML INJECTION SSIP - CITY
1.0000 [IU] | Freq: Four times a day (QID) | SUBCUTANEOUS | Status: DC
Start: 2018-03-27 — End: 2018-03-29
  Administered 2018-03-27: 12 [IU] via SUBCUTANEOUS
  Administered 2018-03-28: 7 [IU] via SUBCUTANEOUS
  Administered 2018-03-28: 3 [IU] via SUBCUTANEOUS
  Administered 2018-03-28: 8 [IU] via SUBCUTANEOUS
  Administered 2018-03-28: 7 [IU] via SUBCUTANEOUS
  Administered 2018-03-29: 0 [IU] via SUBCUTANEOUS
  Administered 2018-03-29: 3 [IU] via SUBCUTANEOUS
  Filled 2018-03-27: qty 300

## 2018-03-27 MED ORDER — ASPIRIN 81 MG CHEWABLE TABLET
81.0000 mg | CHEWABLE_TABLET | Freq: Every day | ORAL | Status: DC
Start: 2018-03-27 — End: 2018-03-29
  Administered 2018-03-27 – 2018-03-29 (×3): 81 mg via ORAL
  Filled 2018-03-27 (×3): qty 1

## 2018-03-27 MED ORDER — IPRATROPIUM BROMIDE 0.02 % SOLUTION FOR INHALATION
0.5000 mg | RESPIRATORY_TRACT | Status: AC
Start: 2018-03-27 — End: 2018-03-27

## 2018-03-27 MED ORDER — CEFTRIAXONE 1 GRAM/50 ML IN DEXTROSE (ISO-OSMOT) DUPLEX
1.0000 g | INJECTION | INTRAVENOUS | Status: AC
Start: 2018-03-27 — End: 2018-03-27
  Administered 2018-03-27: 0 g via INTRAVENOUS
  Administered 2018-03-27: 1 g via INTRAVENOUS
  Filled 2018-03-27: qty 50

## 2018-03-27 MED ORDER — ALBUTEROL SULFATE CONCENTRATE 2.5 MG/0.5 ML SOLUTION FOR NEBULIZATION
INHALATION_SOLUTION | RESPIRATORY_TRACT | Status: AC
Start: 2018-03-27 — End: 2018-03-27
  Administered 2018-03-27: 2.5 mg via RESPIRATORY_TRACT
  Filled 2018-03-27: qty 1

## 2018-03-27 MED ORDER — MAGNESIUM SULFATE 1 GRAM/100 ML IN DEXTROSE 5 % INTRAVENOUS PIGGYBACK
1.00 g | INJECTION | INTRAVENOUS | Status: AC
Start: 2018-03-27 — End: 2018-03-27
  Administered 2018-03-27: 1 g via INTRAVENOUS
  Administered 2018-03-27: 0 g via INTRAVENOUS
  Filled 2018-03-27: qty 100

## 2018-03-27 MED ORDER — FUROSEMIDE 20 MG TABLET
20.00 mg | ORAL_TABLET | Freq: Two times a day (BID) | ORAL | Status: DC
Start: 2018-03-27 — End: 2018-03-29
  Administered 2018-03-27 – 2018-03-29 (×4): 20 mg via ORAL
  Filled 2018-03-27 (×4): qty 1

## 2018-03-27 MED ORDER — PIOGLITAZONE 15 MG TABLET
15.00 mg | ORAL_TABLET | Freq: Every morning | ORAL | Status: DC
Start: 2018-03-28 — End: 2018-03-29
  Administered 2018-03-28 – 2018-03-29 (×2): 15 mg via ORAL
  Filled 2018-03-27 (×3): qty 1

## 2018-03-27 MED ORDER — ALBUTEROL SULFATE CONCENTRATE 2.5 MG/0.5 ML SOLUTION FOR NEBULIZATION
5.00 mg | INHALATION_SOLUTION | RESPIRATORY_TRACT | Status: DC | PRN
Start: 2018-03-27 — End: 2018-03-29
  Administered 2018-03-28: 5 mg via RESPIRATORY_TRACT
  Filled 2018-03-27: qty 2

## 2018-03-27 MED ORDER — CEFTRIAXONE 1 GRAM/50 ML IN DEXTROSE (ISO-OSMOT) INTRAVENOUS PIGGYBACK
1.0000 g | INJECTION | INTRAVENOUS | Status: DC
Start: 2018-03-28 — End: 2018-03-29
  Administered 2018-03-28: 1 g via INTRAVENOUS
  Administered 2018-03-28: 0 g via INTRAVENOUS
  Filled 2018-03-27 (×3): qty 50

## 2018-03-27 MED ORDER — IPRATROPIUM BROMIDE 0.02 % SOLUTION FOR INHALATION
0.50 mg | RESPIRATORY_TRACT | Status: DC | PRN
Start: 2018-03-27 — End: 2018-03-29
  Administered 2018-03-28: 0.5 mg via RESPIRATORY_TRACT
  Filled 2018-03-27: qty 1

## 2018-03-27 MED ORDER — SODIUM CHLORIDE 0.9 % (FLUSH) INJECTION SYRINGE
10.0000 mL | INJECTION | INTRAMUSCULAR | Status: DC | PRN
Start: 2018-03-27 — End: 2018-03-29

## 2018-03-27 MED ORDER — ALBUTEROL SULFATE CONCENTRATE 2.5 MG/0.5 ML SOLUTION FOR NEBULIZATION
5.0000 mg | INHALATION_SOLUTION | RESPIRATORY_TRACT | Status: AC
Start: 2018-03-27 — End: 2018-03-27

## 2018-03-27 MED ORDER — ATORVASTATIN 40 MG TABLET
40.0000 mg | ORAL_TABLET | Freq: Every day | ORAL | Status: DC
Start: 2018-03-27 — End: 2018-03-29
  Administered 2018-03-27 – 2018-03-29 (×3): 40 mg via ORAL
  Filled 2018-03-27 (×3): qty 1

## 2018-03-27 MED ORDER — DOXYCYCLINE HYCLATE 100 MG INTRAVENOUS POWDER FOR SOLUTION
100.00 mg | Freq: Two times a day (BID) | INTRAVENOUS | Status: DC
Start: 2018-03-28 — End: 2018-03-29
  Administered 2018-03-28: 100 mg via INTRAVENOUS
  Administered 2018-03-28 (×2): 0 mg via INTRAVENOUS
  Administered 2018-03-28 – 2018-03-29 (×2): 100 mg via INTRAVENOUS
  Administered 2018-03-29: 0 mg via INTRAVENOUS
  Filled 2018-03-27 (×6): qty 10

## 2018-03-27 MED ORDER — IPRATROPIUM BROMIDE 0.02 % SOLUTION FOR INHALATION
RESPIRATORY_TRACT | Status: AC
Start: 2018-03-27 — End: 2018-03-27
  Administered 2018-03-27: 0.5 mg via RESPIRATORY_TRACT
  Filled 2018-03-27: qty 1

## 2018-03-27 MED ORDER — LINAGLIPTIN 5 MG TABLET
5.0000 mg | ORAL_TABLET | Freq: Every day | ORAL | Status: DC
Start: 2018-03-27 — End: 2018-03-29
  Administered 2018-03-27 – 2018-03-29 (×3): 5 mg via ORAL
  Filled 2018-03-27 (×3): qty 1

## 2018-03-27 MED ORDER — ALBUTEROL SULFATE CONCENTRATE 2.5 MG/0.5 ML SOLUTION FOR NEBULIZATION
INHALATION_SOLUTION | RESPIRATORY_TRACT | Status: AC
Start: 2018-03-27 — End: 2018-03-27
  Administered 2018-03-27: 5 mg via RESPIRATORY_TRACT
  Filled 2018-03-27: qty 2

## 2018-03-27 MED ORDER — LORAZEPAM 2 MG/ML INJECTION SOLUTION
0.5000 mg | INTRAMUSCULAR | Status: DC | PRN
Start: 2018-03-27 — End: 2018-03-29
  Administered 2018-03-28: 0.5 mg via INTRAVENOUS
  Filled 2018-03-27: qty 1

## 2018-03-27 MED ORDER — ACETAMINOPHEN 325 MG TABLET
650.0000 mg | ORAL_TABLET | Freq: Four times a day (QID) | ORAL | Status: DC | PRN
Start: 2018-03-27 — End: 2018-03-29

## 2018-03-27 MED ORDER — SODIUM CHLORIDE 0.9 % (FLUSH) INJECTION SYRINGE
10.0000 mL | INJECTION | Freq: Three times a day (TID) | INTRAMUSCULAR | Status: DC
Start: 2018-03-27 — End: 2018-03-29
  Administered 2018-03-27 – 2018-03-29 (×5): 10 mL via INTRAVENOUS

## 2018-03-27 MED ORDER — IPRATROPIUM BROMIDE 0.02 % SOLUTION FOR INHALATION
0.50 mg | RESPIRATORY_TRACT | Status: AC
Start: 2018-03-27 — End: 2018-03-27

## 2018-03-27 MED ORDER — ENOXAPARIN 40 MG/0.4 ML SUB-Q SYRINGE - EAST
40.0000 mg | INJECTION | Freq: Every day | SUBCUTANEOUS | Status: DC
Start: 2018-03-27 — End: 2018-03-29
  Administered 2018-03-27 – 2018-03-29 (×3): 40 mg via SUBCUTANEOUS
  Filled 2018-03-27 (×3): qty 0.4

## 2018-03-27 MED ORDER — DOXYCYCLINE HYCLATE 100 MG INTRAVENOUS POWDER FOR SOLUTION
100.00 mg | INTRAVENOUS | Status: AC
Start: 2018-03-27 — End: 2018-03-27
  Administered 2018-03-27: 100 mg via INTRAVENOUS
  Administered 2018-03-27: 0 mg via INTRAVENOUS
  Filled 2018-03-27: qty 10

## 2018-03-27 MED ORDER — MORPHINE 4 MG/ML INTRAVENOUS SOLUTION
4.0000 mg | INTRAVENOUS | Status: DC | PRN
Start: 2018-03-27 — End: 2018-03-29

## 2018-03-27 MED ORDER — AMLODIPINE 5 MG TABLET
10.0000 mg | ORAL_TABLET | Freq: Every day | ORAL | Status: DC
Start: 2018-03-27 — End: 2018-03-29
  Administered 2018-03-27 – 2018-03-29 (×3): 10 mg via ORAL
  Filled 2018-03-27 (×3): qty 2

## 2018-03-27 MED ORDER — METHYLPREDNISOLONE SOD SUCCINATE 40 MG/ML SOLUTION FOR INJ. WRAPPER
40.0000 mg | Freq: Three times a day (TID) | INTRAMUSCULAR | Status: DC
Start: 2018-03-27 — End: 2018-03-28
  Administered 2018-03-27 – 2018-03-28 (×2): 40 mg via INTRAVENOUS
  Filled 2018-03-27 (×2): qty 1

## 2018-03-27 MED ADMIN — magnesium sulfate 1 gram/100 mL in dextrose 5 % intravenous piggyback: INTRAVENOUS | @ 16:00:00

## 2018-03-27 MED ADMIN — sodium chloride 0.9 % (flush) injection syringe: INTRAVENOUS | @ 20:00:00

## 2018-03-27 MED ADMIN — lisinopriL 5 mg tablet: ORAL | @ 20:00:00

## 2018-03-27 MED ADMIN — heparin, porcine (PF) 10 unit/mL intravenous syringe: INTRAVENOUS | @ 20:00:00

## 2018-03-27 MED ADMIN — sodium chloride 0.9 % (flush) injection syringe: ORAL | @ 20:00:00

## 2018-03-27 MED ADMIN — diazePAM 5 mg tablet: INTRAVENOUS | @ 15:00:00

## 2018-03-27 MED ADMIN — aminocaproic acid 250 mg/mL intravenous solution: ORAL | @ 20:00:00

## 2018-03-27 MED ADMIN — sodium chloride 0.9 % (flush) injection syringe: INTRAVENOUS | @ 17:00:00

## 2018-03-27 MED ADMIN — ondansetron HCl (PF) 4 mg/2 mL injection solution: INTRAVENOUS | @ 18:00:00

## 2018-03-27 MED ADMIN — NICOTINE INHALER MOUTHPIECE: RESPIRATORY_TRACT | @ 15:00:00 | NDC 09991000822

## 2018-03-27 MED ADMIN — sodium chloride 0.9 % (flush) injection syringe: INTRAVENOUS | @ 18:00:00

## 2018-03-27 MED ADMIN — oxyCODONE-acetaminophen 5 mg-325 mg tablet: RESPIRATORY_TRACT | @ 15:00:00

## 2018-03-27 MED ADMIN — inhalational spacing device: INTRAVENOUS | @ 15:00:00

## 2018-03-27 MED ADMIN — nitroglycerin 0.4 mg sublingual tablet: ORAL | @ 20:00:00

## 2018-03-27 NOTE — ED Nurses Note (Signed)
Report given to RN receiving PT.

## 2018-03-27 NOTE — Nurses Notes (Signed)
Pt FS 455 hospitalist paged orders to give 12units of  Humalog and rechecked BS. Insulin given and this nurse will rechecked the BS and make hopsitalist aware.      Deloris Ping, RN  03/27/2018, 20:32

## 2018-03-27 NOTE — ED Nurses Note (Signed)
MD verbal order to draw culture hold.

## 2018-03-27 NOTE — ED Nurses Note (Signed)
Rounded on pt, updated on POC, NAD noted. Call light in reach.

## 2018-03-27 NOTE — ED Triage Notes (Signed)
Pt is here with cough that has progressed to having trouble catching her breath

## 2018-03-27 NOTE — ED Nurses Note (Signed)
Pt up to use bathroom, NAD noted.

## 2018-03-27 NOTE — ED Nurses Note (Signed)
Attempted report 

## 2018-03-27 NOTE — ED Nurses Note (Signed)
MD at bedside. 

## 2018-03-27 NOTE — Nurses Notes (Signed)
Shift report provided by Karen Chafe, with opportunity to ask questions and clarify issues. Pt up in chair and on 3L O2 NC with no distress noted. No needs requested. Call bell and other personal items within reach. Assumes care.

## 2018-03-27 NOTE — ED Provider Notes (Signed)
Lianne Cure, MD  [facility]  Emergency Department Visit Note    Date:  03/27/2018  Primary care provider:  Hubbard Robinson, DO  Means of arrival:  private car  History obtained from: patient  History limited by: none    Chief Complaint:  Cough    HISTORY OF PRESENT ILLNESS     [name], date of birth 18-Aug-1955, is a 63 y.o. female who has a history of chronic obstructive pulmonary disease and hypertension presents to the Emergency Department complaining of  A Cough. The patient reports a cough accompanied by shortness of breath for the past week. The patient notes that she is on 3 liters of oxygen at home. There are no other accompanying symptoms or modifying factors. No chest pain, measured fevers, abdominal pain, nausea, vomiting, diarrhea, urinary symptoms, or focal neurologic symptoms. No pain assessment given. Patient is a current every day smoker.     REVIEW OF SYSTEMS     The pertinent positive and negative symptoms are as per HPI. All other systems reviewed and are negative.     PATIENT HISTORY     Past Medical History:  Past Medical History:   Diagnosis Date   . COPD (chronic obstructive pulmonary disease) (CMS HCC)    . Degenerative joint disease involving multiple joints    . Diabetes mellitus (CMS [place])    . HTN (hypertension)    . Smoker    . 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 Wears glasses        Past Surgical History:  Past Surgical History:   Procedure Laterality Date   . Hx carpal tunnel release         Family History:  Family Medical History:     Problem Relation (Age of Onset)    COPD Mother    Coronary Artery Disease Father    Diabetes Mother, Sister, Brother, Paternal Grandmother        Social History:  Social History     Tobacco Use   . Smoking status: Current Every Day Smoker     Packs/day: 1.00     Years: 20.00     Pack years: 20.00     Types: Cigarettes   . Smokeless tobacco: Never Used   Substance Use Topics   . Alcohol use: No   . Drug use: No     Social  History     Substance and Sexual Activity   Drug Use No       Medications:  Current Outpatient Medications   Medication Sig   . albuterol (PROVENTIL) 2.5 mg/0.5 mL Inhalation Solution for Nebulization 2.5 mg by Nebulization route Four times a day   . albuterol (PROVENTIL) 2.5 mg/0.5 mL Inhalation Solution for Nebulization 0.5 mL (2.5 mg total) by Nebulization route ONCE EVERY 4 HOURS   . amLODIPine (NORVASC) 10 mg Oral Tablet TAKE 1 TABLET BY MOUTH ONCE DAILY   . aspirin 81 mg Oral Tablet, Chewable Take 1 Tab (81 mg total) by mouth Once a day   . atorvastatin (LIPITOR) 40 mg Oral Tablet Take 1 Tab (40 mg total) by mouth Once a day   . ATROVENT HFA 17 mcg/actuation Inhalation HFA Aerosol Inhaler oral inhaler INHALE 2 PUFFS BY MOUTH 4 TIMES DAILY   . ATROVENT HFA 17 mcg/actuation Inhalation HFA Aerosol Inhaler oral inhaler INHALE 2 PUFFS BY MOUTH 4 TIMES DAILY   . fluticasone propion-salmeterol (ADVAIR) 500-50 mcg/dose  Inhalation Disk with Device oral diskus inhaler INHALE 1 DOSE BY MOUTH TWICE DAILY   . fluticasone-salmeterol (ADVAIR) 500-50 mcg/dose Inhalation Disk with Device oral diskus inhaler Take 1 INHALATION by inhalation Twice daily for 30 days   . furosemide (LASIX) 20 mg Oral Tablet TAKE 1/2 (ONE-HALF) TABLET BY MOUTH ONCE DAILY FOR 30 DAYS   . glimepiride (AMARYL) 2 mg Oral Tablet Take 1 Tab (2 mg total) by mouth Every morning   . glimepiride (AMARYL) 4 mg Oral Tablet TAKE 1 TABLET BY MOUTH ONCE DAILY IN THE MORNING   . ipratropium (ATROVENT) 0.02 % Inhalation Solution 2.5 mL (0.5 mg total) by Nebulization route ONCE EVERY 4 HOURS   . JANUVIA 100 mg Oral Tablet TAKE 1 TABLET BY MOUTH ONCE DAILY   . lisinopril (PRINIVIL) 5 mg Oral Tablet TAKE 1 TABLET BY MOUTH ONCE DAILY   . lisinopril (PRINIVIL) 5 mg Oral Tablet TAKE 1 TABLET BY MOUTH ONCE DAILY   . pioglitazone (ACTOS) 15 mg Oral Tablet TAKE 1 TABLET BY MOUTH ONCE DAILY     Allergies:  Allergies   Allergen Reactions   . Codeine  Other Adverse Reaction  (Add comment)     Sick to stomach   . Hydrocodone Nausea/ Vomiting   . Metformin Diarrhea   . Oxycodone Nausea/ Vomiting       PHYSICAL EXAM     Vitals:  Filed Vitals:    03/27/18 1440   BP: (!) 174/82   Pulse: (!) 107   Resp: 18   Temp: 36.7 C (98.1 F)   SpO2: 90%     Pulse ox  90% on None (Room Air) interpreted by me as: Normal    Physical Exam:   General: Chronically ill appearing. Slight use of the accessory muscles. Very pleasant.   Eyes: Conjunctiva are clear. Pupils are equal, round, and reactive to light and accommodation bilaterally.  HENT: Lips dry, mucous membranes are moist. Nares are clear. Posterior oropharynx is clear without erythema.  Neck: Supple. No meningeal signs.  Lungs: Diffuse tight polymorphic expiratory wheezing with diminished and prolonged expiratory phases. Minimal use of the accessory muscles.   Cardiovascular: Normal rate and regular rhythm. No murmurs, rubs or gallops.  Abdomen: Soft. Non-tender. No rebound, guarding, or peritoneal signs.   Extremities: Atraumatic. No cyanosis. Trace to 1+ pitting lower extremity edema.   Skin: Warm and dry.  Neurologic: Strength 5/5 in all major muscle groups. Sensation normal throughout. Cranial nerve exam within normal limits.   Psychiatric: Alert and oriented x 3. Affect within normal limits.    DIAGNOSTIC STUDIES     Labs:    Results for orders placed or performed during the hospital encounter of 03/27/18   COMPREHENSIVE METABOLIC PROFILE - [facility] ONLY   Result Value Ref Range    SODIUM 138 136 - 145 mmol/L    POTASSIUM 4.3 3.4 - 5.1 mmol/L    CHLORIDE 97 (L) 101 - 111 mmol/L    CO2 TOTAL 31 22 - 32 mmol/L    ANION GAP 10 3 - 11 mmol/L    BUN 4 (L) 6 - 20 mg/dL    CREATININE 0.55 0.44 - 1.00 mg/dL    BUN/CREA RATIO 7 6 - 22    ESTIMATED GFR >60 >60 mL/min/1.83m^2    ALBUMIN 3.6 3.5 - 5.0 g/dL    CALCIUM 9.5 8.8 - 10.2 mg/dL    GLUCOSE 145 (H) 70 - 110 mg/dL    ALKALINE PHOSPHATASE 99 38 - 126 U/L  ALT (SGPT) 16 14 - 54 U/L    AST (SGOT) 12  (L) 15 - 41 U/L    BILIRUBIN TOTAL 0.6 0.3 - 1.2 mg/dL    PROTEIN TOTAL 7.3 6.4 - 8.3 g/dL    ALBUMIN/GLOBULIN RATIO 1.0 0.8 - 2.0   TROPONIN-I   Result Value Ref Range    TROPONIN I <0.03 <=0.06 ng/mL   CBC WITH DIFF   Result Value Ref Range    WBC 12.7 (H) 4.0 - 11.0 x10^3/uL    RBC 5.27 (H) 4.00 - 5.10 x10^6/uL    HGB 15.8 (H) 12.0 - 15.5 g/dL    HCT 47.9 (H) 36.0 - 45.0 %    MCV 90.9 82.0 - 97.0 fL    MCH 30.0 27.5 - 33.2 pg    MCHC 33.0 32.0 - 36.0 g/dL    RDW 14.5 11.0 - 16.0 %    PLATELETS 411 150 - 450 x10^3/uL    MPV 7.7 7.4 - 10.5 fL    NEUTROPHIL % 67 43 - 76 %    LYMPHOCYTE % 22 15 - 43 %    MONOCYTE % 9 5 - 12 %    EOSINOPHIL % 1 0 - 5 %    BASOPHIL % 1 0 - 3 %    NEUTROPHIL # 8.50 (H) 1.50 - 6.50 x10^3/uL    LYMPHOCYTE # 2.80 1.00 - 4.80 x10^3/uL    MONOCYTE # 1.10 (H) 0.20 - 0.90 x10^3/uL    EOSINOPHIL # 0.10 0.00 - 0.50 x10^3/uL    BASOPHIL # 0.10 0.00 - 0.10 x10^3/uL   B-TYPE NATRIURETIC PEPTIDE   Result Value Ref Range    BNP 119 (H) 0 - 100 pg/mL     Labs reviewed and interpreted by me.    Radiology:    XR CHEST AP PORTABLE   Final Result   Small infiltrate or atelectasis lower left lung            Radiologist location ID: [id number]           Radiological imaging interpreted by radiologist and independently reviewed by me.    EKG:  12 lead EKG interpreted by me shows sinus tachycardia, rate of 104 bpm, no diagnostic ST elevation/changes. Normal intervals.      ED PROGRESS NOTE / MEDICAL DECISION MAKING     I have reviewed this patient's current vital signs. In addition I have examined the available pertinent past medical records and the notes by our nursing staff for this visit. Patient had IV access established and was placed on a monitor throughout her stay.    Orders Placed This Encounter   . XR CHEST AP PORTABLE   . COMPREHENSIVE METABOLIC PROFILE - [facility] ONLY   . TROPONIN-I   . CBC WITH DIFF   . B-TYPE NATRIURETIC PEPTIDE   . OXYGEN - NASAL CANNULA   . ECG 12-LEAD   . INSERT & MAINTAIN  PERIPHERAL IV ACCESS   . AND Linked Order Group    . albuterol (PROVENTIL) 2.5mg / 0.5 mL nebulizer solution    . ipratropium (ATROVENT) 0.02% nebulizer solution   . methylPREDNISolone sod succ (SOLU-MEDROL) 125 mg/2 mL injection   . magnesium sulfate 1 G in D5W 100 mL premix IVPB   . AND Linked Order Group    . albuterol (PROVENTIL) 2.5mg / 0.5 mL nebulizer solution    . ipratropium (ATROVENT) 0.02% nebulizer solution   . cefTRIAXone (ROCEPHIN) 1 g in iso-osmotic 50 mL duplex IVPB   .  doxycycline hyclate 100 mg in NS 100 mL IVPB        Breathing treatment was initially ordered.    14:52: Initial evaluation is complete at this time. I discussed with the patient and her husband that I would order a CXR, CBC, CMP, Troponin, BNP, and ECG to further evaluate. Will treat her with Solu Medrol, Magnesium sulfate, and an additional breathing treatment. They are agreeable with the treatment plan at this time.    16:24: Work up reviewed -- Mild Leukocytosis noted. BNP is at baseline. CXR findings noted. Will cover with IV antibiotics. Rocephin and Doxycyline hyclate ordered.     16:42: I discussed the patient's case and above findings with Dr. Edwina Barth [facility]) who is making arrangements for admission.    16:43: On recheck, the patient is in no acute distress and is resting comfortably here. I explained the results of the diagnostic studies and updated her on plans for admission. She is agreeable with this.    MIPS     Not applicable     OPIATE PRESCRIPTION       Not applicable    CORE MEASURES     Not applicable    CRITICAL CARE TIME     Total Critical Care Time for this patient is 31 minutes and it is exclusive of procedural time.    PRE-DISPOSITION VITALS       03/27/18 1500 03/27/18 1515 03/27/18 1606   BP: (!) 157/65 (!) 140/114 (!) 156/62   Pulse: 100 (!) 105 91   Resp: (!) 25 (!) 26 (!) 26   Temp:      SpO2: 93% 90% 94%     CLINICAL IMPRESSION     1. Acute left-sided pneumonia   2. Acute COPD  exacerbation  3. Acute hypoxemia/Acute early respiratory failure   4. Critical care time -- 31 minutes, exclusive of procedures    DISPOSITION/PLAN     Admit -- Hospitalist Service    Condition at Disposition: [place] Nance Pear, SCRIBE scribed for Lianne Cure, MD on 03/27/2018 at 2:47 PM.    Documentation assistance provided for Lianne Cure, MD  by Nance Pear, [place]. Information recorded by the scribe was done at my direction and has been reviewed and validated by me Althea Grimmer, Merrie Roof, MD.

## 2018-03-27 NOTE — H&P (Signed)
[facility]  [place], [place] [address]    General Admission History and Physical  Shellee Milo DNP, FNP-BC        Wynn Maudlin  Date of Admission:  03/27/2018  Date of Birth:  07/05/1955    PCP: Hubbard Robinson, DO    Chief Complaint:  Shortness of breath and cough      HPI: [name] is a 63 y.o., [place] female who presents with a cough. The patient reports a cough accompanied by shortness of breath for the past week. The patient notes that she is on 3 liters of oxygen at home as needed, but in the past week she has felt a need to supplement her oxygen more frequently due to worsening cough and dyspnea.  There are no other accompanying symptoms or modifying factors. No chest pain, measured fevers, abdominal pain, nausea, vomiting, diarrhea, urinary symptom, or focal neurologic symptoms. No pain assessment given. Patient is a current every day smoker.     CXR on admission shows LLL infiltrate suggesting a pneumonia.  She was started on IV rocephin and doxy.  Patient admits to feeling better at this exam.    Lab results reviewed with patient, there is mild leukocytosis, remaining values unremarkable.        ROS: Other than ROS in the HPI, all other systems were negative.    Active Hospital Problems   (*Primary Problem)    Diagnosis   . Acute exacerbation of chronic obstructive pulmonary disease (COPD) (CMS HCC)       Past Medical History:   Diagnosis Date   . COPD (chronic obstructive pulmonary disease) (CMS HCC)    . Degenerative joint disease involving multiple joints    . Diabetes mellitus (CMS [place])    . HTN (hypertension)    . Smoker    . 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 Wears glasses            Past Surgical History:   Procedure Laterality Date   . HX CARPAL TUNNEL RELEASE             Medications Prior to Admission     Prescriptions    albuterol (PROVENTIL) 2.5 mg/0.5 mL Inhalation Solution for Nebulization    2.5 mg by Nebulization route Four times a day       albuterol (PROVENTIL) 2.5 mg/0.5 mL Inhalation Solution for Nebulization    0.5 mL (2.5 mg total) by Nebulization route ONCE EVERY 4 HOURS    amLODIPine (NORVASC) 10 mg Oral Tablet    TAKE 1 TABLET BY MOUTH ONCE DAILY    aspirin 81 mg Oral Tablet, Chewable    Take 1 Tab (81 mg total) by mouth Once a day    atorvastatin (LIPITOR) 40 mg Oral Tablet    Take 1 Tab (40 mg total) by mouth Once a day    ATROVENT HFA 17 mcg/actuation Inhalation HFA Aerosol Inhaler oral inhaler    INHALE 2 PUFFS BY MOUTH 4 TIMES DAILY    ATROVENT HFA 17 mcg/actuation Inhalation HFA Aerosol Inhaler oral inhaler    INHALE 2 PUFFS BY MOUTH 4 TIMES DAILY    fluticasone propion-salmeterol (ADVAIR) 500-50 mcg/dose Inhalation Disk with Device oral diskus inhaler    INHALE 1 DOSE BY MOUTH TWICE DAILY    fluticasone-salmeterol (ADVAIR) 500-50 mcg/dose Inhalation Disk with Device oral diskus inhaler    Take 1 INHALATION by inhalation Twice daily  for 30 days    furosemide (LASIX) 20 mg Oral Tablet    TAKE 1/2 (ONE-HALF) TABLET BY MOUTH ONCE DAILY FOR 30 DAYS    glimepiride (AMARYL) 2 mg Oral Tablet    Take 1 Tab (2 mg total) by mouth Every morning    glimepiride (AMARYL) 4 mg Oral Tablet    TAKE 1 TABLET BY MOUTH ONCE DAILY IN THE MORNING    ipratropium (ATROVENT) 0.02 % Inhalation Solution    2.5 mL (0.5 mg total) by Nebulization route ONCE EVERY 4 HOURS    JANUVIA 100 mg Oral Tablet    TAKE 1 TABLET BY MOUTH ONCE DAILY    lisinopril (PRINIVIL) 5 mg Oral Tablet    TAKE 1 TABLET BY MOUTH ONCE DAILY    lisinopril (PRINIVIL) 5 mg Oral Tablet    TAKE 1 TABLET BY MOUTH ONCE DAILY    pioglitazone (ACTOS) 15 mg Oral Tablet    TAKE 1 TABLET BY MOUTH ONCE DAILY        cefTRIAXone (ROCEPHIN) 1 g in iso-osmotic 50 mL duplex IVPB, 1 g, Intravenous, Now  doxycycline hyclate 100 mg in NS 100 mL IVPB, 100 mg, Intravenous, Now        Allergies   Allergen Reactions   . Codeine  Other Adverse Reaction (Add comment)     Sick to stomach   . Hydrocodone Nausea/  Vomiting   . Metformin Diarrhea   . Oxycodone Nausea/ Vomiting       Social History     Tobacco Use   . Smoking status: Current Every Day Smoker     Packs/day: 1.00     Years: 20.00     Pack years: 20.00     Types: Cigarettes   . Smokeless tobacco: Never Used   Substance Use Topics   . Alcohol use: No       Family Medical History:     Problem Relation (Age of Onset)    COPD Mother    Coronary Artery Disease Father    Diabetes Mother, Sister, Brother, Paternal Grandmother                DNR Status:  FULL    EXAM:  Temperature: 36.7 C (98.1 F)  Heart Rate: 91  BP (Non-Invasive): (!) 156/62  Respiratory Rate: (!) 26  SpO2: 94 %  General: Chronically ill appearing. Slight use of the accessory muscles. Very pleasant.   Eyes: Conjunctiva are clear. Pupils are equal, round, and reactive to light and accommodation bilaterally.  HENT: Lips dry, mucous membranes are moist. Nares are clear. Posterior oropharynx is clear without erythema.  Neck: Supple. No meningeal signs.  Lungs: Diffuse tight polymorphic expiratory wheezing with diminished and prolonged expiratory phases. Minimal use of the accessory muscles.   Cardiovascular: Normal rate and regular rhythm. No murmurs, rubs or gallops.  Abdomen: Soft. Non-tender. No rebound, guarding, or peritoneal signs.   Extremities: Atraumatic. No cyanosis. Trace to 1+ pitting lower extremity edema.   Skin: Warm and dry.  Neurologic: Strength 5/5 in all major muscle groups. Sensation normal throughout. Cranial nerve exam within normal limits.   Psychiatric: Alert and oriented x 3. Affect within normal limits.    Labs:    I have reviewed all lab results.  Lab Results for Last 24 Hours:    Results for orders placed or performed during the hospital encounter of 03/27/18 (from the past 24 hour(s))   COMPREHENSIVE METABOLIC PROFILE - [facility] ONLY   Result Value Ref  Range    SODIUM 138 136 - 145 mmol/L    POTASSIUM 4.3 3.4 - 5.1 mmol/L    CHLORIDE 97 (L) 101 - 111 mmol/L    CO2 TOTAL 31 22 -  32 mmol/L    ANION GAP 10 3 - 11 mmol/L    BUN 4 (L) 6 - 20 mg/dL    CREATININE 0.55 0.44 - 1.00 mg/dL    BUN/CREA RATIO 7 6 - 22    ESTIMATED GFR >60 >60 mL/min/1.20m^2    ALBUMIN 3.6 3.5 - 5.0 g/dL    CALCIUM 9.5 8.8 - 10.2 mg/dL    GLUCOSE 145 (H) 70 - 110 mg/dL    ALKALINE PHOSPHATASE 99 38 - 126 U/L    ALT (SGPT) 16 14 - 54 U/L    AST (SGOT) 12 (L) 15 - 41 U/L    BILIRUBIN TOTAL 0.6 0.3 - 1.2 mg/dL    PROTEIN TOTAL 7.3 6.4 - 8.3 g/dL    ALBUMIN/GLOBULIN RATIO 1.0 0.8 - 2.0   TROPONIN-I   Result Value Ref Range    TROPONIN I <0.03 <=0.06 ng/mL   CBC WITH DIFF   Result Value Ref Range    WBC 12.7 (H) 4.0 - 11.0 x10^3/uL    RBC 5.27 (H) 4.00 - 5.10 x10^6/uL    HGB 15.8 (H) 12.0 - 15.5 g/dL    HCT 47.9 (H) 36.0 - 45.0 %    MCV 90.9 82.0 - 97.0 fL    MCH 30.0 27.5 - 33.2 pg    MCHC 33.0 32.0 - 36.0 g/dL    RDW 14.5 11.0 - 16.0 %    PLATELETS 411 150 - 450 x10^3/uL    MPV 7.7 7.4 - 10.5 fL    NEUTROPHIL % 67 43 - 76 %    LYMPHOCYTE % 22 15 - 43 %    MONOCYTE % 9 5 - 12 %    EOSINOPHIL % 1 0 - 5 %    BASOPHIL % 1 0 - 3 %    NEUTROPHIL # 8.50 (H) 1.50 - 6.50 x10^3/uL    LYMPHOCYTE # 2.80 1.00 - 4.80 x10^3/uL    MONOCYTE # 1.10 (H) 0.20 - 0.90 x10^3/uL    EOSINOPHIL # 0.10 0.00 - 0.50 x10^3/uL    BASOPHIL # 0.10 0.00 - 0.10 x10^3/uL   B-TYPE NATRIURETIC PEPTIDE   Result Value Ref Range    BNP 119 (H) 0 - 100 pg/mL       Imaging Studies:    Interpreted by radiologist and individually reviewed by me:  EXAMINATION: XR CHEST AP PORTABLE     EXAM DATE/TIME: 03/27/2018 3:13 PM    CLINICAL INDICATION: SOB    FINDINGS: The cardiomediastinal silhouette is within normal limits. Small  infiltrate or atelectasis left lower lung zone. Lungs and pleural spaces  are otherwise clear.  Unremarkable bones.    IMPRESSION:  Small infiltrate or atelectasis lower left lung      Assessment/Plan:   Acute LLL infiltrate, CAP  Acute exacerbation COPD  Acute hypoxia/ early respiratory failure   -mild leukocystosis   -blood cultures obtained,  will follow   -affirms she is on 3 L NC home oxygen as needed, continue    -started on IV rocephin and doxy in ED, continue   -duonebs q4   -IV solumedrol x 4 doses   -admit telemetry  Type II diabetes mellitus, hyperglycemic   -admission BG level 145   -continue Januvia, actos   -FS and SSI Protocol  coverage   -ada diet  Hypertension, uncontrolled   -continue amlodipine, lisinopril and lasix  Hyperlipidemia   -continue home dose lipitor     Overall PLAN  Admit telemetry, IV antibiotics, symptom management      DVT and GI prophylaxis. Lovenox and protonix.  Code status. Full code as discussed with patient     Disposition:   Discharge will be pending test results, need for further testing, or further complications.    Shellee Milo DNP, FNP-BC

## 2018-03-28 LAB — CBC WITH DIFF
BASOPHIL #: 0 10*3/uL (ref 0.00–0.10)
BASOPHIL %: 0 % (ref 0–3)
EOSINOPHIL #: 0 10*3/uL (ref 0.00–0.50)
EOSINOPHIL %: 0 % (ref 0–5)
HCT: 45 % (ref 36.0–45.0)
HGB: 14.8 g/dL (ref 12.0–15.5)
LYMPHOCYTE #: 1.2 10*3/uL (ref 1.00–4.80)
LYMPHOCYTE %: 12 % — ABNORMAL LOW (ref 15–43)
MCH: 30.2 pg (ref 27.5–33.2)
MCHC: 32.8 g/dL (ref 32.0–36.0)
MCV: 92.2 fL (ref 82.0–97.0)
MONOCYTE #: 0.2 10*3/uL (ref 0.20–0.90)
MONOCYTE %: 2 % — ABNORMAL LOW (ref 5–12)
MPV: 8.4 fL (ref 7.4–10.5)
NEUTROPHIL #: 8.9 10*3/uL — ABNORMAL HIGH (ref 1.50–6.50)
NEUTROPHIL %: 86 % — ABNORMAL HIGH (ref 43–76)
PLATELETS: 370 10*3/uL (ref 150–450)
RBC: 4.88 10*6/uL (ref 4.00–5.10)
RDW: 14 % (ref 11.0–16.0)
WBC: 10.4 10*3/uL (ref 4.0–11.0)

## 2018-03-28 LAB — COMPREHENSIVE METABOLIC PROFILE - BMC/JMC ONLY
ALBUMIN/GLOBULIN RATIO: 0.9 (ref 0.8–2.0)
ALBUMIN: 3.3 g/dL — ABNORMAL LOW (ref 3.5–5.0)
ALKALINE PHOSPHATASE: 88 U/L (ref 38–126)
ALT (SGPT): 14 U/L (ref 14–54)
ANION GAP: 10 mmol/L (ref 3–11)
AST (SGOT): 11 U/L — ABNORMAL LOW (ref 15–41)
BILIRUBIN TOTAL: 0.6 mg/dL (ref 0.3–1.2)
BUN/CREA RATIO: 12 (ref 6–22)
BUN: 7 mg/dL (ref 6–20)
CALCIUM: 9.1 mg/dL (ref 8.8–10.2)
CHLORIDE: 97 mmol/L — ABNORMAL LOW (ref 101–111)
CO2 TOTAL: 29 mmol/L (ref 22–32)
CREATININE: 0.58 mg/dL (ref 0.44–1.00)
ESTIMATED GFR: >60 mL/min/1.73mˆ2 (ref >60)
GLUCOSE: 329 mg/dL — ABNORMAL HIGH (ref 70–110)
POTASSIUM: 4.2 mmol/L (ref 3.4–5.1)
PROTEIN TOTAL: 6.9 g/dL (ref 6.4–8.3)
SODIUM: 136 mmol/L (ref 136–145)

## 2018-03-28 LAB — POC FINGERSTICK GLUCOSE - BMC/JMC (RESULTS)
GLUCOSE, POC: 314 mg/dL — ABNORMAL HIGH (ref 60–100)
GLUCOSE, POC: 313 mg/dL — ABNORMAL HIGH (ref 60–100)
GLUCOSE, POC: 245 mg/dL — ABNORMAL HIGH (ref 60–100)
GLUCOSE, POC: 366 mg/dL — ABNORMAL HIGH (ref 60–100)

## 2018-03-28 LAB — ECG 12-LEAD
Atrial Rate: 104 {beats}/min
Calculated P Axis: 18 degrees
Calculated R Axis: 72 degrees
Calculated T Axis: 59 degrees
PR Interval: 126 ms
QRS Duration: 96 ms
QT Interval: 358 ms
QTC Calculation: 470 ms
Ventricular rate: 104 {beats}/min

## 2018-03-28 MED ORDER — METHYLPREDNISOLONE SOD SUCCINATE 40 MG/ML SOLUTION FOR INJ. WRAPPER
20.0000 mg | Freq: Three times a day (TID) | INTRAMUSCULAR | Status: AC
Start: 2018-03-28 — End: 2018-03-28
  Administered 2018-03-28: 20 mg via INTRAVENOUS
  Filled 2018-03-28: qty 1

## 2018-03-28 MED ADMIN — furosemide 20 mg tablet: ORAL | @ 08:00:00

## 2018-03-28 MED ADMIN — methylPREDNISolone sodium succinate 40 mg solution for injection: INTRAVENOUS | @ 03:00:00

## 2018-03-28 MED ADMIN — furosemide 20 mg tablet: ORAL | @ 18:00:00

## 2018-03-28 MED ADMIN — LORazepam 2 mg/mL injection solution: INTRAVENOUS | @ 21:00:00

## 2018-03-28 MED ADMIN — lisinopriL 5 mg tablet: ORAL | @ 08:00:00

## 2018-03-28 MED ADMIN — sodium chloride 0.9 % (flush) injection syringe: INTRAVENOUS | @ 06:00:00

## 2018-03-28 MED ADMIN — oxyCODONE-acetaminophen 5 mg-325 mg tablet: SUBCUTANEOUS | @ 21:00:00

## 2018-03-28 MED ADMIN — sodium chloride 0.9 % (flush) injection syringe: INTRAVENOUS | @ 16:00:00

## 2018-03-28 MED ADMIN — HYDROmorphone (PF) 1 mg/mL injection solution: RESPIRATORY_TRACT | @ 08:00:00

## 2018-03-28 MED ADMIN — sodium chloride 0.9 % intravenous solution: INTRAVENOUS | @ 15:00:00

## 2018-03-28 MED ADMIN — albuterol sulfate 2.5 mg/3 mL (0.083 %) solution for nebulization: ORAL | @ 08:00:00

## 2018-03-28 MED ADMIN — HYDROmorphone (PF) 1 mg/mL injection solution: SUBCUTANEOUS | @ 08:00:00

## 2018-03-28 MED ADMIN — oxyCODONE 5 mg tablet: INTRAVENOUS | @ 18:00:00

## 2018-03-28 NOTE — Care Management Notes (Signed)
I called Morey Hummingbird, Glass blower/designer at [employer], to discuss the patient's portable oxygen. She informed me the patient has a M-6 tank with a conserving device. The M-6 is a 2.8 pound tank. I informed her the patient has not been wearing her portable oxygen tanks because they are going empty after 30 minutes. She informed me she has the patient on 2 liters of oxygen to keep her O2 sat >92%, so if she is wearing 3 liters, she needs a new order because she may need her tanks adjusted or the patient is improperly using her portable oxygen equipment. She informed me the patient may have placed her tank on 4 liters continuous flow, which may cause it to last 30 minutes.  She informed me she will visit the patient's home on Friday to assess if the patient is using the portable oxygen properly.    I met with the patient and informed her the tanks should not be going empty. She informed me she has about 12 tanks and the delivery man informed her she would need a new test for a different machine like [employer]. I informed her she will need to bring a tanks in to go home with. She informed me she will ask her husband to bring a couple tanks. She informed me she has several full tanks.    Oxygen trial ordered. Notified RT.      03/28/18 1355   Assessment Detail   Assessment Type Continued Assessment   Date of Care Management Update 03/28/18   Social Work Tourist information centre manager

## 2018-03-28 NOTE — Nurses Notes (Signed)
516B got out of her bed, ambulated in her room, and is now sitting in her chair. Patient has her call bell in reach.

## 2018-03-28 NOTE — Progress Notes (Signed)
[facility]  [place], [place] [address]    IP PROGRESS NOTE    Shellee Milo DNP, FNP-BC      Wynn Maudlin  Date of Admission:  03/27/2018  Date of Birth:  1955/05/25  Date of Service:  03/28/2018    Chief Complaint: shortness of breath and cough  Subjective: seen during morning rounds, feeling better, breathing a little better, Saturations 93-94% on 3 L NC.  No fever.      Vital Signs:  Temp (24hrs) Max:36.7 C (98.1 F)      Temperature: 36.2 C (97.2 F)  BP (Non-Invasive): (!) 141/77  MAP (Non-Invasive): 92 mmHG  Heart Rate: 97  Respiratory Rate: (!) 24  Pain Score (Numeric, Faces): 0  SpO2: 94 %    Current Medications:  acetaminophen (TYLENOL) tablet, 650 mg, Oral, Q6H PRN  albuterol (PROVENTIL) 2.5mg / 0.5 mL nebulizer solution, 5 mg, Nebulization, Q4H PRN    And  ipratropium (ATROVENT) 0.02% nebulizer solution, 0.5 mg, Nebulization, Q4H PRN  amLODIPine (NORVASC) tablet, 10 mg, Oral, Daily  aspirin chewable tablet 81 mg, 81 mg, Oral, Daily  atorvastatin (LIPITOR) tablet, 40 mg, Oral, Daily  budesonide-formoterol (SYMBICORT) 160 mcg-4.5 mcg per inhalation oral inhaler - "Respiratory to administer", 2 Puff, Inhalation, 2x/day  cefTRIAXone (ROCEPHIN) 1 g in iso-osmotic 50 mL premix IVPB, 1 g, Intravenous, Q24H  SSIP insulin lispro (HUMALOG) 100 units/mL injection, 1-12 Units, Subcutaneous, 4x/day AC    And  D10W bolus infusion 125 mL, 125 mL, Intravenous, Q15 Min PRN  doxycycline hyclate 100 mg in NS 100 mL IVPB, 100 mg, Intravenous, Q12H  enoxaparin (LOVENOX) 40 mg/0.4 mL SubQ injection, 40 mg, Subcutaneous, Daily  furosemide (LASIX) tablet, 20 mg, Oral, 2x/day  glimepiride (AMARYL) tablet, 2 mg, Oral, QAM  linagliptin (TRADJENTA) tablet, 5 mg, Oral, Daily  lisinopril (PRINIVIL) tablet, 5 mg, Oral, Daily  LORazepam (ATIVAN) 2 mg/mL injection, 0.5 mg, Intravenous, Q4H PRN  methylPREDNISolone sod succ (SOLU-MEDROL) 40 mg/mL injection, 40 mg, Intravenous, Q8H  morphine 4 mg/mL  injection, 4 mg, Intravenous, Q5 Min PRN  nitroGLYCERIN (NITROSTAT) sublingual tablet, 0.4 mg, Sublingual, Q5 Min PRN  NS 250 mL flush bag, , Intravenous, Q1H PRN  NS flush syringe, 10 mL, Intravenous, Q8HRS  NS flush syringe, 10 mL, Intravenous, Q1H PRN  ondansetron (ZOFRAN) 2 mg/mL injection, 4 mg, Intravenous, Q8H PRN  pioglitazone (ACTOS) tablet, 15 mg, Oral, Daily with Breakfast        Today's Physical Exam:   General:Chronically ill appearing. Slight use of the accessory muscles.Very pleasant.   Eyes: Conjunctiva are clear. Pupils are equal, round, and reactive to light and accommodation bilaterally.  HENT:Lips dry, mucous membranes are moist. Nares are clear. Posterior oropharynx is clear without erythema.  Neck: Supple. No meningeal signs.  Lungs:Diffuse tight polymorphic expiratory wheezing upper lobes, improved. Minimal use of the accessory muscles.  Cardiovascular: Normal rate and regular rhythm. No murmurs, rubs or gallops.  Abdomen: Soft. Non-tender. No rebound, guarding, or peritoneal signs.   Extremities: Atraumatic. No cyanosis.Trace to 1+ pitting lower extremity edema.  Skin: Warm and dry.  Neurologic:Strength 5/5 in all major muscle groups. Sensation normal throughout. Cranial nerve exam within normal limits.  Psychiatric: Alert and oriented x 3. Affect within normal limits.  I/O:  I/O last 24 hours:      Intake/Output Summary (Last 24 hours) at 03/28/2018 1059  Last data filed at 03/28/2018 1000  Gross per 24 hour   Intake 1100 ml   Output --  Net 1100 ml     I/O current shift:  03/18 0700 - 03/18 1859  In: 140 [I.V.:140]  Out: -       Labs  Please indicate ordered or reviewed)  Reviewed:   I have reviewed all lab results.  Lab Results for Last 24 Hours:    Results for orders placed or performed during the hospital encounter of 03/27/18 (from the past 24 hour(s))   ECG 12-LEAD   Result Value Ref Range    Ventricular rate 104 BPM    Atrial Rate 104 BPM    PR Interval 126 ms    QRS Duration  96 ms    QT Interval 358 ms    QTC Calculation 470 ms    Calculated P Axis 18 degrees    Calculated R Axis 72 degrees    Calculated T Axis 59 degrees   COMPREHENSIVE METABOLIC PROFILE - [facility] ONLY   Result Value Ref Range    SODIUM 138 136 - 145 mmol/L    POTASSIUM 4.3 3.4 - 5.1 mmol/L    CHLORIDE 97 (L) 101 - 111 mmol/L    CO2 TOTAL 31 22 - 32 mmol/L    ANION GAP 10 3 - 11 mmol/L    BUN 4 (L) 6 - 20 mg/dL    CREATININE 0.55 0.44 - 1.00 mg/dL    BUN/CREA RATIO 7 6 - 22    ESTIMATED GFR >60 >60 mL/min/1.81m^2    ALBUMIN 3.6 3.5 - 5.0 g/dL    CALCIUM 9.5 8.8 - 10.2 mg/dL    GLUCOSE 145 (H) 70 - 110 mg/dL    ALKALINE PHOSPHATASE 99 38 - 126 U/L    ALT (SGPT) 16 14 - 54 U/L    AST (SGOT) 12 (L) 15 - 41 U/L    BILIRUBIN TOTAL 0.6 0.3 - 1.2 mg/dL    PROTEIN TOTAL 7.3 6.4 - 8.3 g/dL    ALBUMIN/GLOBULIN RATIO 1.0 0.8 - 2.0   TROPONIN-I   Result Value Ref Range    TROPONIN I <0.03 <=0.06 ng/mL   CBC WITH DIFF   Result Value Ref Range    WBC 12.7 (H) 4.0 - 11.0 x10^3/uL    RBC 5.27 (H) 4.00 - 5.10 x10^6/uL    HGB 15.8 (H) 12.0 - 15.5 g/dL    HCT 47.9 (H) 36.0 - 45.0 %    MCV 90.9 82.0 - 97.0 fL    MCH 30.0 27.5 - 33.2 pg    MCHC 33.0 32.0 - 36.0 g/dL    RDW 14.5 11.0 - 16.0 %    PLATELETS 411 150 - 450 x10^3/uL    MPV 7.7 7.4 - 10.5 fL    NEUTROPHIL % 67 43 - 76 %    LYMPHOCYTE % 22 15 - 43 %    MONOCYTE % 9 5 - 12 %    EOSINOPHIL % 1 0 - 5 %    BASOPHIL % 1 0 - 3 %    NEUTROPHIL # 8.50 (H) 1.50 - 6.50 x10^3/uL    LYMPHOCYTE # 2.80 1.00 - 4.80 x10^3/uL    MONOCYTE # 1.10 (H) 0.20 - 0.90 x10^3/uL    EOSINOPHIL # 0.10 0.00 - 0.50 x10^3/uL    BASOPHIL # 0.10 0.00 - 0.10 x10^3/uL   B-TYPE NATRIURETIC PEPTIDE   Result Value Ref Range    BNP 119 (H) 0 - 100 pg/mL   POC FINGERSTICK GLUCOSE - [facility] (RESULTS)   Result Value Ref Range    GLUCOSE, POC  237 (H) 60 - 100 mg/dl   POC FINGERSTICK GLUCOSE - [facility] (RESULTS)   Result Value Ref Range    GLUCOSE, POC 455 (HH) 60 - 100 mg/dl   POC FINGERSTICK GLUCOSE - [facility] (RESULTS)    Result Value Ref Range    GLUCOSE, POC 416 (HH) 60 - 100 mg/dl   COMPREHENSIVE METABOLIC PROFILE - [facility] ONLY   Result Value Ref Range    SODIUM 136 136 - 145 mmol/L    POTASSIUM 4.2 3.4 - 5.1 mmol/L    CHLORIDE 97 (L) 101 - 111 mmol/L    CO2 TOTAL 29 22 - 32 mmol/L    ANION GAP 10 3 - 11 mmol/L    BUN 7 6 - 20 mg/dL    CREATININE 0.58 0.44 - 1.00 mg/dL    BUN/CREA RATIO 12 6 - 22    ESTIMATED GFR >60 >60 mL/min/1.59m^2    ALBUMIN 3.3 (L) 3.5 - 5.0 g/dL    CALCIUM 9.1 8.8 - 10.2 mg/dL    GLUCOSE 329 (H) 70 - 110 mg/dL    ALKALINE PHOSPHATASE 88 38 - 126 U/L    ALT (SGPT) 14 14 - 54 U/L    AST (SGOT) 11 (L) 15 - 41 U/L    BILIRUBIN TOTAL 0.6 0.3 - 1.2 mg/dL    PROTEIN TOTAL 6.9 6.4 - 8.3 g/dL    ALBUMIN/GLOBULIN RATIO 0.9 0.8 - 2.0   CBC WITH DIFF   Result Value Ref Range    WBC 10.4 4.0 - 11.0 x10^3/uL    RBC 4.88 4.00 - 5.10 x10^6/uL    HGB 14.8 12.0 - 15.5 g/dL    HCT 45.0 36.0 - 45.0 %    MCV 92.2 82.0 - 97.0 fL    MCH 30.2 27.5 - 33.2 pg    MCHC 32.8 32.0 - 36.0 g/dL    RDW 14.0 11.0 - 16.0 %    PLATELETS 370 150 - 450 x10^3/uL    MPV 8.4 7.4 - 10.5 fL    NEUTROPHIL % 86 (H) 43 - 76 %    LYMPHOCYTE % 12 (L) 15 - 43 %    MONOCYTE % 2 (L) 5 - 12 %    EOSINOPHIL % 0 0 - 5 %    BASOPHIL % 0 0 - 3 %    NEUTROPHIL # 8.90 (H) 1.50 - 6.50 x10^3/uL    LYMPHOCYTE # 1.20 1.00 - 4.80 x10^3/uL    MONOCYTE # 0.20 0.20 - 0.90 x10^3/uL    EOSINOPHIL # 0.00 0.00 - 0.50 x10^3/uL    BASOPHIL # 0.00 0.00 - 0.10 x10^3/uL   POC FINGERSTICK GLUCOSE - [facility] (RESULTS)   Result Value Ref Range    GLUCOSE, POC 314 (H) 60 - 100 mg/dl           Radiology Tests (Please indicate ordered or reviewed)  Interpreted by radiologist and individually reviewed by me: N/A  EXAMINATION: XR CHEST AP PORTABLE     EXAM DATE/TIME: 03/27/2018 3:13 PM    CLINICAL INDICATION: SOB    FINDINGS: The cardiomediastinal silhouette is within normal limits. Small  infiltrate or atelectasis left lower lung zone. Lungs and pleural spaces  are otherwise clear.  Unremarkable bones.    IMPRESSION:  Small infiltrate or atelectasis lower left lung    Problem List:  Active Hospital Problems   (*Primary Problem)    Diagnosis   . Acute exacerbation of chronic obstructive pulmonary disease (COPD) (CMS HCC)  Assessment/ Plan:   Acute LLL infiltrate, CAP  Acute exacerbation COPD  Acute hypoxia/ early respiratory failure                -mild leukocystosis, normal this am labs                -blood cultures obtained, will follow                -affirms she is on 3 L NC home oxygen as needed, continue                 -started on IV rocephin and doxy in ED, continue                -duonebs q4                -IV solumedrol x 4 doses                -admit telemetry  Type II diabetes mellitus, hyperglycemic                -admission BG level 145   -anticipate hyperglycemia with IV steroid                -continue Januvia, actos                -FS and SSI Protocol coverage                -ada diet  Hypertension, uncontrolled                -continue amlodipine, lisinopril and lasix  Hyperlipidemia                -continue home dose lipitor     Overall PLAN  Admit telemetry, IV antibiotics, symptom management  DC in the am      DVT and GI prophylaxis. Lovenox and protonix.  Code status. Full code as discussed with patient       Disposition:   Discharge will be pending test results, need for further testing, or further complications.    Shellee Milo DNP, FNP-BC

## 2018-03-28 NOTE — Nurses Notes (Signed)
516B got out of the chair, ambulated in her room, and ambulated one full lap around the unit-which is approximately 308 feet. Patient is now sitting on the edge of the bed and has her call bell in reach.

## 2018-03-28 NOTE — Care Plan (Signed)
Plan of care and medication reviewed with pt who demonstrated understanding, pt is A&O X4. On O2 nc at 3L with no distress noted. Pt is independent at room. Tele # [id number] monitoring on with ST in the 100's. Pt appears to be sleeping with breathing even and unlabored. Call bell and other personal items placed within reach.    Problem: Adult Inpatient Plan of Care  Goal: Plan of Care Review  Outcome: Ongoing (see interventions/notes)  Goal: Patient-Specific Goal (Individualized)  Outcome: Ongoing (see interventions/notes)  Flowsheets (Taken 03/27/2018 1800 by Iver Nestle, RN)  Individualized Care Needs: NO  Anxieties, Fears or Concerns: NO  Patient-Specific Goals (Include Timeframe): To get back home  Goal: Absence of Hospital-Acquired Illness or Injury  Outcome: Ongoing (see interventions/notes)  Intervention: Identify and Manage Fall Risk  Flowsheets (Taken 03/27/2018 2028)  Safety Promotion/Fall Prevention:  . safety round/check completed  . nonskid shoes/slippers when out of bed  Intervention: Prevent Skin Injury  Flowsheets (Taken 03/27/2018 2028)  Body Position:  . supine, head elevated  . positioned/repositioned independently  Intervention: Prevent and Manage VTE (venous thromboembolism) Risk  Flowsheets (Taken 03/27/2018 2028)  VTE Prevention/Management:  . ambulation promoted  . anticoagulant therapy initiated  Intervention: Prevent Infection  Flowsheets (Taken 03/27/2018 2028)  Infection Prevention: glycemic control managed  Goal: Optimal Comfort and Wellbeing  Outcome: Ongoing (see interventions/notes)  Intervention: Provide Person-Centered Care  Flowsheets (Taken 03/27/2018 2028)  Trust Relationship/Rapport:  . care explained  . choices provided  . emotional support provided  Goal: Rounds/Family Conference  Outcome: Ongoing (see interventions/notes)     Problem: Adjustment to Illness COPD (Chronic Obstructive Pulmonary Disease)  Goal: Optimal Chronic Illness Coping  Outcome: Ongoing (see  interventions/notes)  Intervention: Support and Optimize Psychosocial Response  Flowsheets (Taken 03/27/2018 2028)  Family/Support System Care:  . support provided  . self-care encouraged  . involvement promoted  Supportive Measures:  . active listening utilized  . self-responsibility promoted  . self-reflection promoted     Problem: Functional Ability Impaired COPD (Chronic Obstructive Pulmonary Disease)  Goal: Optimal Level of Functional Independence  Outcome: Ongoing (see interventions/notes)  Intervention: Optimize Functional Ability  Flowsheets (Taken 03/27/2018 2028)  Self-Care Promotion:  . independence encouraged  . BADL personal objects within reach  . BADL personal routines maintained  Activity Management:  . activity adjusted per tolerance  . activity clustered for rest periods  . up ad lib  Environmental Support:  . calm environment promoted  . rest periods encouraged     Problem: Infection COPD (Chronic Obstructive Pulmonary Disease)  Goal: Absence of Infection Signs and Symptoms  Outcome: Ongoing (see interventions/notes)  Intervention: Prevent or Manage Infection  Flowsheets (Taken 03/27/2018 2028)  Fever Reduction/Comfort Measures:  . lightweight clothing  . lightweight bedding  Isolation Precautions: protective environment precautions maintained  Infection Management: aseptic technique maintained     Problem: Oral Intake Inadequate COPD (Chronic Obstructive Pulmonary Disease)  Goal: Improved Nutrition Intake  Outcome: Ongoing (see interventions/notes)  Intervention: Promote and Optimize Nutrition  Flowsheets (Taken 03/27/2018 2028)  Oral Nutrition Promotion:  . safe use of adaptive equipment encouraged  . rest periods promoted     Problem: Respiratory Compromise COPD (Chronic Obstructive Pulmonary Disease)  Goal: Effective Oxygenation and Ventilation  Outcome: Ongoing (see interventions/notes)  Intervention: Promote Airway Secretion Clearance  Flowsheets (Taken 03/27/2018 2028)  Cough And Deep Breathing:  done independently per patient  Activity Management:  . activity adjusted per tolerance  . activity clustered for rest periods  . up  ad lib  Intervention: Optimize Oxygenation and Ventilation  Flowsheets (Taken 03/27/2018 2028)  Fluid/Electrolyte Management: fluids provided  Head of Bed [facility]): HOB elevated       Deloris Ping, RN  03/28/2018, 04:32

## 2018-03-28 NOTE — Respiratory Therapy (Signed)
03/28/18 1413        Home Oxygen Assessment   O2 sat on Room Air at Rest 92   O2 sat on Oxygen at rest 95   O2 sat on Room Air while Ambulating 87   o2 sat on NC while Ambulating 91   Oxygen Amount (LPM) 2

## 2018-03-28 NOTE — Care Management Notes (Signed)
I met with the patient. She lives with her husband in a one-level apartment. She is independent with care. She wears home oxygen 3 liters continuous. She has a nebulizer, stationary concentrator and portable oxygen. She informed me the tanks go empty in 30 minutes, so she no longer uses them. She explained that they are small tanks. 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 She mentioned [place] as one of the locations. Her oxygen is supplied by [facility]. I informed her I would call about her oxygen with [facility] if she agrees. She agreed with this, DME form signed for [facility] and placed on the chart. Her PCP is Dr. Sabra Heck. She informed me her next appointment is in April and she sees him as needed. She verbalized prescription coverage through her insurance and she obtains her medications at [facility], [place].     03/28/18 0945   Assessment Detail   Assessment Type Admission   Date of Care Management Update 03/28/18   Date of Next DCP Update 03/29/18   Readmission   Is this a readmission? No   Social Work Animal nutritionist Status initial meeting   Projected Discharge Date 03/29/18   Anticipated Discharge Disposition Home   Patient choice offered to patient/family yes   Form for patient choice reviewed/signed and on chart yes   Discharge Needs Assessment   Readmission Within the Last 30 Days no previous admission in last 30 days   Equipment Currently Used at Home nebulizer;oxygen   Transportation Available car   Referral Information   Admission Type observation   Arrived From home or self-care   Address Verified verified-no changes   Insurance Verified verified-no change   Source of Information Patient   Referral Source patient   Reason for Consult discharge planning   Living Environment   Lives With spouse   Living Arrangements apartment

## 2018-03-28 NOTE — Nurses Notes (Signed)
FS rechecked and hospitalist made aware. No new orders given will continue to monitor.

## 2018-03-28 NOTE — Care Management Notes (Addendum)
I called [facility]. They informed me the patient called them about 30 minutes ago. [facility] informed me she has a regulator on her tank and not the conserving device, so they will need an order. I informed her RT maintained her on 2 liters. They will still visit her home on Friday to check her tank and deliver an oxygen conserving device. I informed them I would send an order.      03/28/18 1440   Assessment Detail   Assessment Type Continued Assessment   Date of Care Management Update 03/28/18

## 2018-03-29 LAB — POC FINGERSTICK GLUCOSE - BMC/JMC (RESULTS): GLUCOSE, POC: 211 mg/dL — ABNORMAL HIGH (ref 60–100)

## 2018-03-29 MED ORDER — FUROSEMIDE 20 MG TABLET
20.0000 mg | ORAL_TABLET | Freq: Every day | ORAL | 0 refills | Status: AC
Start: 2018-03-29 — End: 2018-04-12

## 2018-03-29 MED ORDER — DOXYCYCLINE HYCLATE 100 MG CAPSULE
100.00 mg | ORAL_CAPSULE | Freq: Two times a day (BID) | ORAL | 0 refills | Status: AC
Start: 2018-03-29 — End: 2018-04-08

## 2018-03-29 MED ORDER — CEFDINIR 300 MG CAPSULE
300.00 mg | ORAL_CAPSULE | Freq: Two times a day (BID) | ORAL | 0 refills | Status: AC
Start: 2018-03-29 — End: 2018-04-08

## 2018-03-29 MED ORDER — SACCHAROMYCES BOULARDII 250 MG CAPSULE
250.0000 mg | ORAL_CAPSULE | Freq: Every day | ORAL | 0 refills | Status: AC
Start: 2018-03-29 — End: 2018-04-12

## 2018-03-29 MED ADMIN — sodium chloride 0.9 % (flush) injection syringe: SUBCUTANEOUS | @ 11:00:00

## 2018-03-29 MED ADMIN — fentaNYL (PF) 2 mcg/mL-bupivacaine 0.125 %-NaCl injection solution: ORAL | @ 09:00:00

## 2018-03-29 MED ADMIN — TRIMETHOPRIM/SULFAMETHOXAZOLE  IVPB: ORAL | @ 09:00:00

## 2018-03-29 MED ADMIN — scopolamine 1 mg over 3 days transdermal patch: INTRAVENOUS | @ 07:00:00

## 2018-03-29 MED ADMIN — oxyCODONE 5 mg tablet: ORAL | @ 09:00:00

## 2018-03-29 MED ADMIN — clonazePAM 1 mg tablet: ORAL | @ 09:00:00

## 2018-03-29 NOTE — Discharge Summary (Signed)
[facility]  [place], [place] [address]    DISCHARGE SUMMARY  Shellee Milo DNP, FNP-BC      PATIENT NAME:  [name], [name]  MRN:  [id number]  DOB:  1955-05-11    ADMISSION DATE:  03/27/2018  DISCHARGE DATE:  03/29/2018    ATTENDING PHYSICIAN: Nita Sells, DNP,F*  PRIMARY CARE PHYSICIAN: Hubbard Robinson, DO     ADMISSION DIAGNOSIS: <principal problem not specified>  DISCHARGE DIAGNOSIS:   Active Hospital Problems    Diagnosis Date Noted   . Acute exacerbation of chronic obstructive pulmonary disease (COPD) (CMS HCC) [J44.1] 03/27/2018      Resolved Hospital Problems   No resolved problems to display.     Active Non-Hospital Problems    Diagnosis Date Noted   . COPD with acute exacerbation (CMS HCC) 05/23/2017   . Tobacco use disorder 05/22/2017   . Acute respiratory failure (CMS HCC) 05/21/2017   . Non-compliance 03/27/2017   . 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 Diabetes (CMS [place]) 11/18/2015   . Obesity (BMI 35.0-39.9 without comorbidity) 10/08/2012      DISCHARGE MEDICATIONS:     Current Discharge Medication List      START taking these medications.      Details   cefdinir 300 mg Capsule  Commonly known as:  OMNICEF   300 mg, Oral, 2 TIMES DAILY  Qty:  20 Cap  Refills:  0     doxycycline hyclate 100 mg Capsule  Commonly known as:  VIBRAMYCIN   100 mg, Oral, 2 TIMES DAILY  Qty:  20 Cap  Refills:  0     Saccharomyces boulardii 250 mg Capsule  Commonly known as:  Florastor   250 mg, Oral, DAILY  Qty:  14 Cap  Refills:  0        CONTINUE these medications which have CHANGED during your visit.      Details   furosemide 20 mg Tablet  Commonly known as:  LASIX  What changed:  See the new instructions.   20 mg, Oral, DAILY  Qty:  14 Tab  Refills:  0     glimepiride 4 mg Tablet  Commonly known as:  AMARYL  What changed:  Another medication with the same name was removed. Continue taking this medication, and follow the directions you see here.   TAKE 1 TABLET BY MOUTH ONCE  DAILY IN THE MORNING  Qty:  30 Tab  Refills:  5     lisinopriL 5 mg Tablet  Commonly known as:  PRINIVIL  What changed:  Another medication with the same name was removed. Continue taking this medication, and follow the directions you see here.   TAKE 1 TABLET BY MOUTH ONCE DAILY  Qty:  30 Tab  Refills:  0        CONTINUE these medications - NO CHANGES were made during your visit.      Details   * albuterol 2.5 mg/0.5 mL Solution for Nebulization  Commonly known as:  PROVENTIL   2.5 mg, Nebulization, 4 TIMES DAILY  Qty:  50 Each  Refills:  0     * albuterol 2.5 mg/0.5 mL Solution for Nebulization  Commonly known as:  PROVENTIL   2.5 mg, Nebulization, EVERY 4 HOURS  Qty:  30 Each  Refills:  2     amLODIPine 10 mg Tablet  Commonly known as:  NORVASC   TAKE 1 TABLET BY MOUTH ONCE DAILY  Qty:  30 Tab  Refills:  5     aspirin 81 mg Tablet, Chewable   81 mg, Oral, DAILY  Refills:  0     atorvastatin 40 mg Tablet  Commonly known as:  LIPITOR   40 mg, Oral, DAILY  Qty:  90 Tab  Refills:  4     * fluticasone-salmeterol 500-50 mcg/dose Disk with Device oral diskus inhaler  Commonly known as:  ADVAIR   1 INHALATION, Inhalation, 2 TIMES DAILY  Qty:  1 Inhaler  Refills:  2     * fluticasone propion-salmeteroL 500-50 mcg/dose Disk with Device oral diskus inhaler  Commonly known as:  ADVAIR   INHALE 1 DOSE BY MOUTH TWICE DAILY  Qty:  1 Inhaler  Refills:  2     * ipratropium 0.02 % Solution  Commonly known as:  ATROVENT   0.5 mg, Nebulization, EVERY 4 HOURS  Qty:  30 Vial  Refills:  2     * Atrovent HFA 17 mcg/actuation HFA Aerosol Inhaler oral inhaler  Generic drug:  ipratropium bromide   INHALE 2 PUFFS BY MOUTH 4 TIMES DAILY  Qty:  1 Inhaler  Refills:  2     * Atrovent HFA 17 mcg/actuation HFA Aerosol Inhaler oral inhaler  Generic drug:  ipratropium bromide   INHALE 2 PUFFS BY MOUTH 4 TIMES DAILY  Qty:  1 Inhaler  Refills:  2     Januvia 100 mg Tablet  Generic drug:  SITagliptin   TAKE 1 TABLET BY MOUTH ONCE DAILY  Qty:  30  Tab  Refills:  3     pioglitazone 15 mg Tablet  Commonly known as:  ACTOS   TAKE 1 TABLET BY MOUTH ONCE DAILY  Qty:  90 Tab  Refills:  1         * This list has 7 medication(s) that are the same as other medications prescribed for you. Read the directions carefully, and ask your doctor or other care provider to review them with you.              DISCHARGE INSTRUCTIONS:      DISCHARGE INSTRUCTION - DIABETIC DIET     Diet: DIABETIC DIET      DISCHARGE INSTRUCTION - CARDIAC DIET     Diet: CARDIAC DIET      DISCHARGE INSTRUCTION - ACTIVITY     Activity: GRADUALLY INCREASE ACTIVITY AS TOLERATED      ASPIRIN ALREADY ORDERED     DME - HOME OXYGEN    Study used to evaluate oxygen saturation at rest vs exercise was performed during an inpatient hospital stay within 2 days prior to discharge date and was the last test performed prior to discharge and met the following criteria:   Study performed at rest without oxygen.  Study performed during exercise without oxygen.  Study performed during exercise with oxygen applied that demonstrates improvement.    Results of oxygen trials:  O2 sat on Room Air at Rest: 92 (03/28/18 1413)  O2 sat on Oxygen at rest: 95 (03/28/18 1413)  O2 sat on Room Air while Ambulating: 87 (03/28/18 1413)  Oxygen Amount (LPM): 2 (03/28/18 1413)  o2 sat on NC while Ambulating: 91 (03/28/18 1413)               Ht 160 cm    Wt 98.25 kg    Current Attending: Dr. Leota Jacobsen    I certify this pt is under my care as an attending physician & that I,  or a collaborating ANP/PA had a face to face encounter with the pt or the durable medical power of attorney, as appropriate and explained the need for DME on the following date: 03/28/2018    The primary diagnosis as discussed in the face to face encounter justifying the need for home health services/DME is as follows: COPD    Liter flow per minute 2    RA O2 Sats at rest: 92    RA O2 Sats at Rest Date: 03/28/2018    RA O2 Sats at Rest Time: 7:22 PM    I certify that  home oxygen is necessary due to the following condition At rest, O2 sat is >89% on room air but during exercise O2 sat is <88% and O2 administration improves the hypoxemia    Physician has determined that patient has a severe lung disease or hypoxia related symptoms that will improve with oxygen Yes    Start Date 03/28/2018    Type of Concentrator PORTABLE OXYGEN    Type of Concentrator OXYGEN CONSERVING DEVICE    RA O2 sats with ambulation (if sats 89% or greater @ rest) 87    O2 sats with ambulation AND oxygen at liter flow (if sats 89% or greater @ rest): 91 2 liters   Oxygen use will be continuous? Yes    If not continuous, how many hours per day will oxygen be needed? 16    Patient is mobile within the home and will require portable oxygen: Yes    Estimated Length of Need (in months; 99 mo.= lifetime) 99    Delivery Device NASAL CANNULA    Type of Oxygen Requested GAS      REASON FOR HOSPITALIZATION:  This is a 63 y.o., female  who presented with a CC of shortness of breath and fever    SIGNIFICANT PHYSICAL FINDINGS:   Acute LLL infiltrate, CAP  Acute exacerbation COPD  Acute hypoxia/ early respiratory failure    SIGNIFICANT LAB:   Results for [name], [name] (MRN [id number]) as of 03/29/2018 11:06   Ref. Range 03/28/2018 05:46   WBC Latest Ref Range: 4.0 - 11.0 x10^3/uL 10.4   HGB Latest Ref Range: 12.0 - 15.5 g/dL 14.8   HCT Latest Ref Range: 36.0 - 45.0 % 45.0   PLATELET COUNT Latest Ref Range: 150 - 450 x10^3/uL 370   RBC Latest Ref Range: 4.00 - 5.10 x10^6/uL 4.88   MCV Latest Ref Range: 82.0 - 97.0 fL 92.2   MCHC Latest Ref Range: 32.0 - 36.0 g/dL 32.8   MCH Latest Ref Range: 27.5 - 33.2 pg 30.2   RDW Latest Ref Range: 11.0 - 16.0 % 14.0   MPV Latest Ref Range: 7.4 - 10.5 fL 8.4   PMN'S Latest Ref Range: 43 - 76 % 86 (H)   LYMPHOCYTES Latest Ref Range: 15 - 43 % 12 (L)   EOSINOPHIL Latest Ref Range: 0 - 5 % 0   MONOCYTES Latest Ref Range: 5 - 12 % 2 (L)   BASOPHILS Latest Ref Range: 0 - 3 % 0   PMN ABS Latest  Ref Range: 1.50 - 6.50 x10^3/uL 8.90 (H)   LYMPHS ABS Latest Ref Range: 1.00 - 4.80 x10^3/uL 1.20   EOS ABS Latest Ref Range: 0.00 - 0.50 x10^3/uL 0.00   MONOS ABS Latest Ref Range: 0.20 - 0.90 x10^3/uL 0.20   BASOS ABS Latest Ref Range: 0.00 - 0.10 x10^3/uL 0.00   SODIUM Latest Ref Range: 136 - 145 mmol/L 136  POTASSIUM Latest Ref Range: 3.4 - 5.1 mmol/L 4.2   CHLORIDE Latest Ref Range: 101 - 111 mmol/L 97 (L)   CARBON DIOXIDE Latest Ref Range: 22 - 32 mmol/L 29   BUN Latest Ref Range: 6 - 20 mg/dL 7   CREATININE Latest Ref Range: 0.44 - 1.00 mg/dL 0.58   GLUCOSE Latest Ref Range: 70 - 110 mg/dL 329 (H)   ANION GAP Latest Ref Range: 3 - 11 mmol/L 10   BUN/CREAT RATIO Latest Ref Range: 6 - 22  12   ESTIMATED GLOMERULAR FILTRATION RATE Latest Ref Range: >60 mL/min/1.59m2 >60   CALCIUM Latest Ref Range: 8.8 - 10.2 mg/dL 9.1   TOTAL PROTEIN Latest Ref Range: 6.4 - 8.3 g/dL 6.9   ALBUMIN Latest Ref Range: 3.5 - 5.0 g/dL 3.3 (L)   BILIRUBIN, TOTAL Latest Ref Range: 0.3 - 1.2 mg/dL 0.6   AST (SGOT) Latest Ref Range: 15 - 41 U/L 11 (L)   ALT (SGPT) Latest Ref Range: 14 - 54 U/L 14   ALKALINE PHOSPHATASE Latest Ref Range: 38 - 126 U/L 88   A/G RATIO Latest Ref Range: 0.8 - 2.0  0.9       SIGNIFICANT RADIOLOGY/DISGNOSTIC TESTS:   EXAMINATION: XR CHEST AP PORTABLE     EXAM DATE/TIME: 03/27/2018 3:13 PM    CLINICAL INDICATION: SOB    FINDINGS: The cardiomediastinal silhouette is within normal limits. Small  infiltrate or atelectasis left lower lung zone. Lungs and pleural spaces  are otherwise clear. Unremarkable bones.    IMPRESSION:  Small infiltrate or atelectasis lower left lung      CONSULTATIONS:   NONE    PROCEDURES PERFORMED:   NONE      Physical exam :  General:Chronically ill appearing. Slight use of the accessory muscles.Very pleasant.   Eyes: Conjunctiva are clear. Pupils are equal, round, and reactive to light and accommodation bilaterally.  HENT:Lips dry, mucous membranes are moist. Nares are clear.  Posterior oropharynx is clear without erythema.  Neck: Supple. No meningeal signs.  Lungs:Diffuse tight polymorphic expiratory wheezing upper lobes, improved. Minimal use of the accessory muscles.  Cardiovascular: Normal rate and regular rhythm. No murmurs, rubs or gallops.  Abdomen: Soft. Non-tender. No rebound, guarding, or peritoneal signs.   Extremities: Atraumatic. No cyanosis.Trace to 1+ pitting lower extremity edema.  Skin: Warm and dry.  Neurologic:Strength 5/5 in all major muscle groups. Sensation normal throughout. Cranial nerve exam within normal limits.  Psychiatric: Alert and oriented x 3. Affect within normal limits.  Please see the H&P  for admission details    Excerpt from admission HPI:  HPI: EMEYA CLUTTERis a 63y.o., White female who presents with a cough. The patient reports a cough accompanied by shortness of breath for the past week. The patient notes that she is on 3 liters of oxygen at home as needed, but in the past week she has felt a need to supplement her oxygen more frequently due to worsening cough and dyspnea.  There are no other accompanying symptoms or modifying factors. No chest pain, measured fevers, abdominal pain, nausea, vomiting, diarrhea, urinary symptom, or focal neurologic symptoms. No pain assessment given. Patient is a current every day smoker.    CXR on admission shows LLL infiltrate suggesting a pneumonia.  She was started on IV rocephin and doxy.  Patient admits to feeling better at this exam.    Lab results reviewed with patient, there is mild leukocytosis, remaining values unremarkable.  ROS: Other than ROS in the HPI, all other systems were negative.  Assessment/ Plan:   Acute LLL infiltrate, CAP  Acute exacerbation COPD  Acute hypoxia/ early respiratory failure  -mild leukocystosis, normal this am labs  -blood cultures obtained, will follow  -affirms she is on 3 L NC home oxygen as needed, continue      -started on IV rocephin and doxy in ED, continue  -duonebs q4  -IV solumedrol x 4 doses  -admit telemetry  Type II diabetes mellitus, hyperglycemic  -admission BG level 145                -anticipate hyperglycemia with IV steroid  -continue Januvia, actos  -FS and SSI Protocol coverage  -ada diet  Hypertension, uncontrolled  -continue amlodipine, lisinopril and lasix  Hyperlipidemia  -continue home dose lipitor     Overall PLAN  Admit telemetry, IV antibiotics, symptom management  DC in the am      DVT and GI prophylaxis. Lovenox and protonix.  Code status. Full code as discussed with patient       Disposition:   Discharge will be pending test results, need for further testing, or further complications.    Shellee Milo DNP, FNP-BC          COURSE IN HOSPITAL: As above.      Follow-up appointments:  The following appointments were made for this patient prior to departure from the hospital: PCP in 2 weeks    CONDITION ON DISCHARGE: Alert, Oriented and VS Stable    DISCHARGE DISPOSITION:  Home discharge     cc: Primary Care Physician:  Hubbard Robinson, [address]  [place] [address]     TD:HRCBULAGT Physician:  No referring provider defined for this encounter.       Shellee Milo DNP, FNP-BC

## 2018-03-29 NOTE — Nurses Notes (Signed)
FSBS 366, per medium SSI Protocol notify physician if BS>360.  8u HumaLOG administered.  Paged physician on call to make aware.  Bryon Lions DNP returned page, agrees with 8u administered, no new orders at this time.

## 2018-03-29 NOTE — Care Plan (Signed)
Patient observed to independently ambulate to bathroom with steady gait.  Patient with no new c/o pain/difficulty breathing during shift.  Patient experiencing anxiety at beginning of shift from family situation; PRN Ativan administered to assist patient in relaxing; patient observed to have decreased anxiety after administration.  Patient observed with mild SOB upon exertion; patient reports this is her baseline.  Educated patient on importance of notifying nursing staff immediately if she experiences any new onset of pain/shortness of breath or difficulty breathing; patient verbalized understanding.    Problem: Adult Inpatient Plan of Care  Goal: Plan of Care Review  Outcome: Ongoing (see interventions/notes)     Problem: Adjustment to Illness COPD (Chronic Obstructive Pulmonary Disease)  Goal: Optimal Chronic Illness Coping  Outcome: Ongoing (see interventions/notes)  Intervention: Support and Optimize Psychosocial Response  Flowsheets (Taken 03/29/2018 0456)  Family/Support System Care:   self-care encouraged   support provided  Supportive Measures:   active listening utilized   self-reflection promoted   self-responsibility promoted   verbalization of feelings encouraged     Problem: Functional Ability Impaired COPD (Chronic Obstructive Pulmonary Disease)  Goal: Optimal Level of Functional Independence  Outcome: Ongoing (see interventions/notes)  Intervention: Optimize Functional Ability  Flowsheets (Taken 03/29/2018 0541)  Self-Care Promotion:   independence encouraged   BADL personal objects within reach   BADL personal routines maintained   safe use of adaptive equipment encouraged  Activity Management: --  Environmental Support:   calm environment promoted   rest periods encouraged     Problem: Infection COPD (Chronic Obstructive Pulmonary Disease)  Goal: Absence of Infection Signs and Symptoms  Outcome: Ongoing (see interventions/notes)     Problem: Oral Intake Inadequate COPD (Chronic Obstructive Pulmonary  Disease)  Goal: Improved Nutrition Intake  Outcome: Ongoing (see interventions/notes)  Intervention: Promote and Optimize Nutrition  Flowsheets (Taken 03/29/2018 0541)  Oral Nutrition Promotion:   rest periods promoted   safe use of adaptive equipment encouraged     Problem: Respiratory Compromise COPD (Chronic Obstructive Pulmonary Disease)  Goal: Effective Oxygenation and Ventilation  Outcome: Ongoing (see interventions/notes)  Intervention: Promote Airway Secretion Clearance  Flowsheets (Taken 03/29/2018 0541)  Breathing Techniques/Airway Clearance:   pursed-lip breathing encouraged   deep/controlled cough encouraged  Cough And Deep Breathing: done independently per patient  Activity Management:   activity adjusted per tolerance   activity clustered for rest periods   activity encouraged   up ad lib  Intervention: Optimize Oxygenation and Ventilation  Flowsheets  Taken 03/29/2018 0541  Airway/Ventilation Management: pulmonary hygiene promoted  Fluid/Electrolyte Management: fluids provided  Taken 03/28/2018 2230  Head of Bed (HOB): HOB elevated

## 2018-03-29 NOTE — Care Management Notes (Signed)
Referral Information  ++++++ Placed Provider #1 ++++++  Case Manager: Karen Roach  Provider Type: DME  Provider Name: [facility]  Address:  [address] [place] [address]  Contact: Jonathan Inigo    Phone: [phone] x  Fax:   Fax: [phone]

## 2018-03-29 NOTE — Nurses Notes (Signed)
516B got out of bed, ambulated to the bathroom, and is now sitting up in her chair. Patient has her call bell in reach.

## 2018-03-29 NOTE — Nurses Notes (Signed)
Patient discharged home with family.  AVS reviewed with patient/care giver.  A written copy of the AVS and discharge instructions was given to the patient/care giver.  Questions sufficiently answered as needed.  Patient/care giver encouraged to follow up with PCP as indicated.  In the event of an emergency, patient/care giver instructed to call 911 or go to the nearest emergency room. Pt declined wc, ambulated from unit with safe and steady gait. Husband carrying her personal belongings and personal O2 tank.

## 2018-03-29 NOTE — Nurses Notes (Signed)
Received bedside change of nurse shift report, pt awake and sitting in chair at bedside, states she hopes to go home today. On O2 2L nc continuously. Assumed care of pt.

## 2018-03-29 NOTE — Nurses Notes (Signed)
[name] got out of her chair, ambulated in her room, and ambulated one full lap around the unit-which is approximately 308 feet. Patient is now sitting in her chair and has her call bell in reach.

## 2018-04-01 LAB — ADULT ROUTINE BLOOD CULTURE, SET OF 2 BOTTLES (BACTERIA AND YEAST)
BLOOD CULTURE, ROUTINE: NO GROWTH
BLOOD CULTURE, ROUTINE: NO GROWTH

## 2018-04-05 ENCOUNTER — Other Ambulatory Visit (INDEPENDENT_AMBULATORY_CARE_PROVIDER_SITE_OTHER): Payer: Self-pay | Admitting: Family Medicine

## 2018-04-05 DIAGNOSIS — J449 Chronic obstructive pulmonary disease, unspecified: Secondary | ICD-10-CM

## 2018-04-05 NOTE — Telephone Encounter (Signed)
Refill request for Atrovent.  Rod Can, MA  04/05/2018, 15:10

## 2018-04-11 ENCOUNTER — Other Ambulatory Visit (INDEPENDENT_AMBULATORY_CARE_PROVIDER_SITE_OTHER): Payer: Self-pay | Admitting: Family Medicine

## 2018-04-11 NOTE — Telephone Encounter (Signed)
Refill request for Advair.  Rod Can, MA  04/11/2018, 12:16

## 2018-04-23 ENCOUNTER — Telehealth (INDEPENDENT_AMBULATORY_CARE_PROVIDER_SITE_OTHER): Payer: Self-pay | Admitting: Family Medicine

## 2018-04-23 ENCOUNTER — Ambulatory Visit (INDEPENDENT_AMBULATORY_CARE_PROVIDER_SITE_OTHER): Payer: Self-pay

## 2018-04-23 NOTE — Telephone Encounter (Signed)
Coronavirus Medical Triage  Patient Name: [name]    Disposition: Patient states she was recently treated for pneumonia and "feels another cold coming on".  Informed her that she meets testing criteria due to her symptoms and past medical history.  She also admits that she has not "been staying in like I am supposed to".  She is not interested in getting tested and only wants to be seen by her PCP for her symptoms.  Informed her that she may be able to do a virtual appt, but if not the respiratory clinic is available to her M-F from 0800-1600.  She states she does not have a car or a ride that can bring her until Wednesday.  Educated her on the importance of staying at home and washing her hands appropriately.     DOB: [birth year]  Age: 63 y.o.      Telephone #:   Home Phone [phone]   Work Phone [phone]   Mobile [phone]        Interviewer Name: Kennieth Rad, RN           A) Do you have any of the following?    . Fever? no  . Chills? no  . Cough? yes  . Shortness of breath? yes  If yes proceed to B    If no, testing is not a priority. Asked patient to call back with any changes in status.   Symptoms start date: 04/16/2018    Are you taking Tylenol? no  Is your fever responding? N\A       B) Do you have any of the following medical problems:    . Diabetes yes  . 65 or Older: no  . Heart Disease no  . Immunosuppression no  . Chronic lung Disease yes  . Chronic Kidney Disease no  . HIV no  . Are you currently pregnant? no   If no proceed to C)    If yes patient meets criteria for testing at either tent or respiratory clinic.        C) Have you had any contact with anyone who is a suspect person under investigation or a confirmed case of COVID - 19? no    Have you had any recent travel?  no     If yes, patient meets criteria for testing at either tent or respiratory clinic.        Please ask the patient if they feel safe presenting to the tent. If you feel that the person is not safe for home  management until test returns please call the ED and tell them of the patient's concern status and ask the patient to call before entering.        1. [facility] ED- [phone]  2. [facility] ED- [phone]       Educated patient on importance of hand hygiene, respiratory etiquette (covering mouth and nose when coughing/sneezing, wearing a mask if possible), drink plenty of fluids, rest and avoid contact with sick individuals. Advised patient if symptoms worsen or change to call the COVID-19 triage line and/or the emergency room after hours.    If the patient is to go home with home quarantine then you should cover the following:  Assess the Suitability of the Residential Setting for Home Care  a. In consultation with state or local health department staff, a healthcare professional should assess whether the residential setting is appropriate for home care. Considerations for care at home include whether:  b. The patient  is stable enough to receive care at home.  c. Appropriate caregivers are available at home.  d. There is a separate bedroom where the patient can recover without sharing immediate space with others.  e. Resources for access to food and other necessities are available.  f. The patient and other household members have access to appropriate, recommended personal protective equipment (at a minimum, gloves and facemask) and are capable of adhering to precautions recommended as part of home care or isolation (e.g., respiratory hygiene and cough etiquette, hand hygiene);  g. There are household members who may be at increased risk of complications from WCHEN-27 infection (.e.g., people >24 years old, young children, pregnant women, people who are immunocompromised or who have chronic heart, lung, or kidney conditions).  [email or web address].pdf

## 2018-04-23 NOTE — Telephone Encounter (Signed)
Pt called office stating that she has a cough and SOB. Pt states that she has COPD. I directed her to the covid-19 hotline for precaution    [name]  04/23/2018, 15:44

## 2018-05-05 ENCOUNTER — Other Ambulatory Visit (INDEPENDENT_AMBULATORY_CARE_PROVIDER_SITE_OTHER): Payer: Self-pay | Admitting: Family Medicine

## 2018-05-05 DIAGNOSIS — J449 Chronic obstructive pulmonary disease, unspecified: Secondary | ICD-10-CM

## 2018-05-07 ENCOUNTER — Other Ambulatory Visit (INDEPENDENT_AMBULATORY_CARE_PROVIDER_SITE_OTHER): Payer: Self-pay | Admitting: Family Medicine

## 2018-05-07 NOTE — Telephone Encounter (Signed)
Refill request for Atrovent.  Rod Can, MA  05/07/2018, 08:00

## 2018-06-13 ENCOUNTER — Other Ambulatory Visit (INDEPENDENT_AMBULATORY_CARE_PROVIDER_SITE_OTHER): Payer: Self-pay | Admitting: Family Medicine

## 2018-06-13 DIAGNOSIS — J449 Chronic obstructive pulmonary disease, unspecified: Secondary | ICD-10-CM

## 2018-07-10 MED ADMIN — LORazepam 2 mg/mL injection solution: @ 21:00:00

## 2018-07-13 ENCOUNTER — Other Ambulatory Visit (INDEPENDENT_AMBULATORY_CARE_PROVIDER_SITE_OTHER): Payer: Self-pay | Admitting: Family Medicine

## 2018-07-13 DIAGNOSIS — Z794 Long term (current) use of insulin: Secondary | ICD-10-CM

## 2018-07-13 DIAGNOSIS — R600 Localized edema: Secondary | ICD-10-CM

## 2018-07-13 DIAGNOSIS — I10 Essential (primary) hypertension: Secondary | ICD-10-CM

## 2018-07-13 DIAGNOSIS — E119 Type 2 diabetes mellitus without complications: Secondary | ICD-10-CM

## 2018-07-16 ENCOUNTER — Other Ambulatory Visit (INDEPENDENT_AMBULATORY_CARE_PROVIDER_SITE_OTHER): Payer: Self-pay | Admitting: Family Medicine

## 2018-09-22 ENCOUNTER — Other Ambulatory Visit (INDEPENDENT_AMBULATORY_CARE_PROVIDER_SITE_OTHER): Payer: Self-pay | Admitting: Family Medicine

## 2018-09-28 ENCOUNTER — Encounter (HOSPITAL_BASED_OUTPATIENT_CLINIC_OR_DEPARTMENT_OTHER): Payer: Self-pay | Admitting: Family Medicine

## 2018-09-28 DIAGNOSIS — J449 Chronic obstructive pulmonary disease, unspecified: Secondary | ICD-10-CM

## 2018-09-28 NOTE — Nursing Note (Signed)
Started prior auth for Atrovent HFA through [email or web address]. Waiting response.    Maye Hides, LPN  [id number], [id number]

## 2018-10-01 MED ORDER — ATROVENT HFA 17 MCG/ACTUATION AEROSOL INHALER
2.0000 | INHALATION_SPRAY | Freq: Four times a day (QID) | RESPIRATORY_TRACT | 4 refills | Status: DC
Start: 2018-10-01 — End: 2019-03-21

## 2018-10-01 NOTE — Addendum Note (Signed)
Addended by: Maye Hides on: 10/01/2018 04:10 PM     Modules accepted: Orders

## 2018-10-01 NOTE — Addendum Note (Signed)
Addended by: Effie Berkshire on: 10/01/2018 05:37 PM     Modules accepted: Orders

## 2018-10-01 NOTE — Nursing Note (Signed)
Received fax from [facility] Atrovent is preferred and new script is needed with DAW 0.    Maye Hides, LPN  [id number], [id number]

## 2018-10-25 ENCOUNTER — Telehealth (HOSPITAL_BASED_OUTPATIENT_CLINIC_OR_DEPARTMENT_OTHER): Payer: Self-pay | Admitting: Family Medicine

## 2018-10-25 DIAGNOSIS — J449 Chronic obstructive pulmonary disease, unspecified: Secondary | ICD-10-CM

## 2018-10-25 NOTE — Telephone Encounter (Addendum)
Pt called back said [facility] has the nebulizer.    Pt called back stating [facility] did not have nebulizer, [facility] didn't either.  Reiterated to her to go to ER, she said she would.    Pt called requesting order for nebulizer, her's broke. She now has medicaid ins.  Pharm used is [facility]  Will call pt when done.   Wynelle Bourgeois  10/25/2018, 13:22

## 2018-10-26 ENCOUNTER — Emergency Department (HOSPITAL_COMMUNITY): Payer: MEDICAID

## 2018-10-26 ENCOUNTER — Encounter (HOSPITAL_COMMUNITY): Payer: Self-pay | Admitting: Pediatrics

## 2018-10-26 ENCOUNTER — Other Ambulatory Visit: Payer: Self-pay

## 2018-10-26 ENCOUNTER — Inpatient Hospital Stay
Admission: EM | Admit: 2018-10-26 | Discharge: 2018-10-28 | DRG: 871 | Disposition: A | Discharge status: Home / Self Care | Payer: MEDICAID | Attending: HOSPITALIST | Admitting: HOSPITALIST

## 2018-10-26 DIAGNOSIS — D3502 Benign neoplasm of left adrenal gland: Secondary | ICD-10-CM | POA: Diagnosis present

## 2018-10-26 DIAGNOSIS — Z6835 Body mass index (BMI) 35.0-35.9, adult: Secondary | ICD-10-CM

## 2018-10-26 DIAGNOSIS — E1165 Type 2 diabetes mellitus with hyperglycemia: Secondary | ICD-10-CM | POA: Diagnosis present

## 2018-10-26 DIAGNOSIS — Z9981 Dependence on supplemental oxygen: Secondary | ICD-10-CM

## 2018-10-26 DIAGNOSIS — J189 Pneumonia, unspecified organism: Secondary | ICD-10-CM | POA: Diagnosis present

## 2018-10-26 DIAGNOSIS — Z7982 Long term (current) use of aspirin: Secondary | ICD-10-CM

## 2018-10-26 DIAGNOSIS — E785 Hyperlipidemia, unspecified: Secondary | ICD-10-CM | POA: Diagnosis present

## 2018-10-26 DIAGNOSIS — A419 Sepsis, unspecified organism: Principal | ICD-10-CM | POA: Diagnosis present

## 2018-10-26 DIAGNOSIS — J9611 Chronic respiratory failure with hypoxia: Secondary | ICD-10-CM | POA: Diagnosis present

## 2018-10-26 DIAGNOSIS — E669 Obesity, unspecified: Secondary | ICD-10-CM | POA: Diagnosis present

## 2018-10-26 DIAGNOSIS — Z7951 Long term (current) use of inhaled steroids: Secondary | ICD-10-CM

## 2018-10-26 DIAGNOSIS — Z79899 Other long term (current) drug therapy: Secondary | ICD-10-CM

## 2018-10-26 DIAGNOSIS — I10 Essential (primary) hypertension: Secondary | ICD-10-CM | POA: Diagnosis present

## 2018-10-26 DIAGNOSIS — J440 Chronic obstructive pulmonary disease with acute lower respiratory infection: Secondary | ICD-10-CM | POA: Diagnosis present

## 2018-10-26 DIAGNOSIS — F17210 Nicotine dependence, cigarettes, uncomplicated: Secondary | ICD-10-CM | POA: Diagnosis present

## 2018-10-26 DIAGNOSIS — Z20828 Contact with and (suspected) exposure to other viral communicable diseases: Secondary | ICD-10-CM | POA: Diagnosis present

## 2018-10-26 DIAGNOSIS — Z7984 Long term (current) use of oral hypoglycemic drugs: Secondary | ICD-10-CM

## 2018-10-26 LAB — RESPIRATORY VIRUS PANEL BY BIOFIRE FILM ARRAY: CORONAVIRUS 229E: NOT DETECTED

## 2018-10-26 LAB — COMPREHENSIVE METABOLIC PROFILE - BMC/JMC ONLY
ALBUMIN/GLOBULIN RATIO: 1.2 (ref 0.8–2.0)
ALBUMIN: 3.8 g/dL (ref 3.5–5.0)
ALKALINE PHOSPHATASE: 100 U/L (ref 38–126)
ALT (SGPT): 14 U/L (ref 14–54)
ANION GAP: 12 mmol/L — ABNORMAL HIGH (ref 3–11)
AST (SGOT): 11 U/L — ABNORMAL LOW (ref 15–41)
BILIRUBIN TOTAL: 1.3 mg/dL — ABNORMAL HIGH (ref 0.3–1.2)
BUN/CREA RATIO: 14 (ref 6–22)
BUN: 9 mg/dL (ref 6–20)
CALCIUM: 9 mg/dL (ref 8.8–10.2)
CHLORIDE: 93 mmol/L — ABNORMAL LOW (ref 101–111)
CO2 TOTAL: 25 mmol/L (ref 22–32)
CREATININE: 0.65 mg/dL (ref 0.44–1.00)
ESTIMATED GFR: >60 mL/min/{1.73_m2} (ref >60)
GLUCOSE: 405 mg/dL (ref 70–110)
POTASSIUM: 3.5 mmol/L (ref 3.4–5.1)
PROTEIN TOTAL: 7 g/dL (ref 6.4–8.3)
SODIUM: 130 mmol/L — ABNORMAL LOW (ref 136–145)

## 2018-10-26 LAB — RESPIRATORY VIRUS PANEL
ADENOVIRUS ARRAY: NOT DETECTED
BORDETELLA PERTUSSIS ARRAY: NOT DETECTED
CHLAMYDOPHILA PNEUMONIAE ARRAY: NOT DETECTED
CORONAVIRUS HKU1: NOT DETECTED
CORONAVIRUS NL63: NOT DETECTED
CORONAVIRUS OC43: NOT DETECTED
INFLUENZA A: NOT DETECTED
INFLUENZA B ARRAY: NOT DETECTED
METAPNEUMOVIRUS ARRAY: NOT DETECTED
MYCOPLASMA PNEUMONIAE ARRAY: NOT DETECTED
PARAINFLUENZA 1 ARRAY: NOT DETECTED
PARAINFLUENZA 2 ARRAY: NOT DETECTED
PARAINFLUENZA 3 ARRAY: NOT DETECTED
PARAINFLUENZA 4 ARRAY: NOT DETECTED
RHINOVIRUS/ENTEROVIRUS ARRAY: NOT DETECTED
RSV ARRAY: NOT DETECTED
SARS CORONAVIRUS 2 (SARS-CoV-2): NOT DETECTED

## 2018-10-26 LAB — CBC WITH DIFF
HCT: 44.2 % (ref 36.0–45.0)
HGB: 15.1 g/dL (ref 12.0–15.5)
MCH: 30.6 pg (ref 27.5–33.2)
MCHC: 34 g/dL (ref 32.0–36.0)
MCV: 89.8 fL (ref 82.0–97.0)
MPV: 8.1 fL (ref 7.4–10.5)
PLATELETS: 268 10*3/uL (ref 150–450)
RBC: 4.93 10*6/uL (ref 4.00–5.10)
RDW: 14 % (ref 11.0–16.0)
WBC: 24.7 10*3/uL — ABNORMAL HIGH (ref 4.0–11.0)

## 2018-10-26 LAB — MANUAL DIFFERENTIAL
BAND %: 2 % (ref 0–4)
LYMPHOCYTE %: 8 % — ABNORMAL LOW (ref 15–43)
LYMPHOCYTE ABSOLUTE: 1.98 10*3/uL (ref 1.00–4.80)
MONOCYTE %: 9 % (ref 5–12)
MONOCYTE ABSOLUTE: 2.22 10*3/uL — ABNORMAL HIGH (ref 0.20–0.90)
NEUTROPHIL %: 81 % — ABNORMAL HIGH (ref 43–76)
NEUTROPHIL ABSOLUTE: 20.5 10*3/uL — ABNORMAL HIGH (ref 1.50–6.50)
PLATELET ESTIMATE: ADEQUATE
RBC MORPHOLOGY COMMENT: NORMAL
WBC MORPHOLOGY COMMENT: NORMAL
WBC: 24.7 10*3/uL

## 2018-10-26 LAB — TROPONIN-I: TROPONIN I: <0.03 ng/mL (ref <=0.06)

## 2018-10-26 LAB — LACTIC ACID LEVEL W/ REFLEX FOR LEVEL >2.0: LACTIC ACID: 1.2 mmol/L (ref 0.5–2.0)

## 2018-10-26 LAB — BLUE TOP TUBE

## 2018-10-26 LAB — POC FINGERSTICK GLUCOSE - BMC/JMC (RESULTS): GLUCOSE, POC: 332 mg/dL — ABNORMAL HIGH (ref 60–100)

## 2018-10-26 LAB — PROCALCITONIN: PROCALCITONIN: 0.11 ng/mL (ref <0.50)

## 2018-10-26 LAB — RED TOP TUBE

## 2018-10-26 MED ORDER — ENOXAPARIN 40 MG/0.4 ML SUB-Q SYRINGE - EAST
40.0000 mg | INJECTION | Freq: Every day | SUBCUTANEOUS | Status: DC
Start: 2018-10-27 — End: 2018-10-28
  Administered 2018-10-27 – 2018-10-28 (×2): 40 mg via SUBCUTANEOUS
  Filled 2018-10-26 (×2): qty 0.4

## 2018-10-26 MED ORDER — AZITHROMYCIN 500 MG INTRAVENOUS SOLUTION
500.00 mg | INTRAVENOUS | Status: DC
Start: 2018-10-27 — End: 2018-10-26

## 2018-10-26 MED ORDER — FLU VACCINE QS 2020-21(6MOS UP)(PF) 60 MCG(15 MCGX4)/0.5 ML IM SYRINGE
0.50 mL | INJECTION | Freq: Once | INTRAMUSCULAR | Status: AC
Start: 2018-10-27 — End: 2018-10-27
  Administered 2018-10-27: 0.5 mL via INTRAMUSCULAR
  Filled 2018-10-26: qty 0.5

## 2018-10-26 MED ORDER — PROCHLORPERAZINE EDISYLATE 10 MG/2 ML (5 MG/ML) INJECTION SOLUTION
10.00 mg | Freq: Four times a day (QID) | INTRAMUSCULAR | Status: DC | PRN
Start: 2018-10-26 — End: 2018-10-28

## 2018-10-26 MED ORDER — ACETAMINOPHEN 325 MG TABLET
650.0000 mg | ORAL_TABLET | Freq: Four times a day (QID) | ORAL | Status: DC | PRN
Start: 2018-10-26 — End: 2018-10-28

## 2018-10-26 MED ORDER — KETOROLAC 15 MG/ML INJECTION SOLUTION
15.00 mg | Freq: Four times a day (QID) | INTRAMUSCULAR | Status: DC | PRN
Start: 2018-10-26 — End: 2018-10-28
  Administered 2018-10-27: 15 mg via INTRAVENOUS
  Filled 2018-10-26: qty 1

## 2018-10-26 MED ORDER — INSULIN LISPRO 100 UNIT/ML INJECTION SSIP - CITY
1.00 [IU] | Freq: Four times a day (QID) | SUBCUTANEOUS | Status: DC
Start: 2018-10-26 — End: 2018-10-28
  Administered 2018-10-26 – 2018-10-27 (×3): 7 [IU] via SUBCUTANEOUS
  Administered 2018-10-27: 12:00:00 1 [IU] via SUBCUTANEOUS
  Administered 2018-10-27 – 2018-10-28 (×2): 3 [IU] via SUBCUTANEOUS
  Administered 2018-10-28: 11:00:00
  Filled 2018-10-26: qty 300

## 2018-10-26 MED ORDER — CEFTRIAXONE 1 GRAM/50 ML IN DEXTROSE (ISO-OSMOT) DUPLEX
1.0000 g | INJECTION | INTRAVENOUS | Status: DC
Start: 2018-10-26 — End: 2018-10-26
  Administered 2018-10-26: 18:00:00 1 g via INTRAVENOUS
  Administered 2018-10-26: 0 g via INTRAVENOUS
  Filled 2018-10-26: qty 50

## 2018-10-26 MED ORDER — SODIUM CHLORIDE 0.9 % INTRAVENOUS SOLUTION
2.0000 g | INTRAVENOUS | Status: DC
Start: 2018-10-27 — End: 2018-10-28
  Administered 2018-10-27: 23:00:00 2 g via INTRAVENOUS
  Administered 2018-10-27: 23:00:00 0 g via INTRAVENOUS
  Filled 2018-10-26 (×2): qty 20

## 2018-10-26 MED ORDER — ASPIRIN 81 MG CHEWABLE TABLET
81.0000 mg | CHEWABLE_TABLET | Freq: Every day | ORAL | Status: DC
Start: 2018-10-27 — End: 2018-10-28
  Administered 2018-10-27 – 2018-10-28 (×2): 81 mg via ORAL
  Filled 2018-10-26 (×2): qty 1

## 2018-10-26 MED ORDER — BENZONATATE 100 MG CAPSULE
200.00 mg | ORAL_CAPSULE | Freq: Three times a day (TID) | ORAL | Status: DC | PRN
Start: 2018-10-26 — End: 2018-10-28

## 2018-10-26 MED ORDER — ALBUTEROL SULFATE CONCENTRATE 2.5 MG/0.5 ML SOLUTION FOR NEBULIZATION
2.50 mg | INHALATION_SOLUTION | RESPIRATORY_TRACT | Status: DC | PRN
Start: 2018-10-26 — End: 2018-10-28
  Filled 2018-10-26: qty 1

## 2018-10-26 MED ORDER — DEXTROSE 10 % IN WATER (D10W) BOLUS
125.00 mL | INJECTION | INTRAVENOUS | Status: DC | PRN
Start: 2018-10-26 — End: 2018-10-28

## 2018-10-26 MED ORDER — SODIUM CHLORIDE 0.9 % (FLUSH) INJECTION SYRINGE
10.0000 mL | INJECTION | INTRAMUSCULAR | Status: DC | PRN
Start: 2018-10-26 — End: 2018-10-28
  Administered 2018-10-27: 10 mL via INTRAVENOUS

## 2018-10-26 MED ORDER — ACETAMINOPHEN 500 MG TABLET
1000.00 mg | ORAL_TABLET | ORAL | Status: AC
Start: 2018-10-26 — End: 2018-10-26
  Administered 2018-10-26: 21:00:00 1000 mg via ORAL
  Filled 2018-10-26: qty 2

## 2018-10-26 MED ORDER — BUDESONIDE-FORMOTEROL HFA 160 MCG-4.5 MCG/ACTUATION AEROSOL INHALER
2.00 | INHALATION_SPRAY | Freq: Two times a day (BID) | RESPIRATORY_TRACT | Status: DC
Start: 2018-10-26 — End: 2018-10-28
  Administered 2018-10-26: 22:00:00 via RESPIRATORY_TRACT
  Administered 2018-10-27 – 2018-10-28 (×3): 2 via RESPIRATORY_TRACT
  Filled 2018-10-26: qty 6

## 2018-10-26 MED ORDER — SODIUM CHLORIDE 0.9 % INTRAVENOUS SOLUTION
500.00 mg | INTRAVENOUS | Status: DC
Start: 2018-10-26 — End: 2018-10-28
  Administered 2018-10-26: 18:00:00 500 mg via INTRAVENOUS
  Administered 2018-10-26: 19:00:00 0 mg via INTRAVENOUS
  Administered 2018-10-27: 500 mg via INTRAVENOUS
  Administered 2018-10-27: 0 mg via INTRAVENOUS
  Filled 2018-10-26 (×3): qty 5

## 2018-10-26 MED ORDER — ATORVASTATIN 40 MG TABLET
40.0000 mg | ORAL_TABLET | Freq: Every day | ORAL | Status: DC
Start: 2018-10-27 — End: 2018-10-28
  Administered 2018-10-27 – 2018-10-28 (×2): 40 mg via ORAL
  Filled 2018-10-26 (×2): qty 1

## 2018-10-26 MED ORDER — CEFTRIAXONE 1 GRAM/50 ML IN DEXTROSE (ISO-OSMOT) INTRAVENOUS PIGGYBACK
1.00 g | INJECTION | INTRAVENOUS | Status: AC
Start: 2018-10-26 — End: 2018-10-26
  Administered 2018-10-26: 0 g via INTRAVENOUS
  Administered 2018-10-26: 1 g via INTRAVENOUS
  Filled 2018-10-26: qty 50

## 2018-10-26 MED ORDER — LACTATED RINGERS IV BOLUS
20.00 mL/kg | INJECTION | Status: AC
Start: 2018-10-26 — End: 2018-10-26
  Administered 2018-10-26: 1712 mL via INTRAVENOUS
  Administered 2018-10-26: 0 mL/kg via INTRAVENOUS

## 2018-10-26 MED ORDER — SODIUM CHLORIDE 0.9 % (FLUSH) INJECTION SYRINGE
10.0000 mL | INJECTION | Freq: Three times a day (TID) | INTRAMUSCULAR | Status: DC
Start: 2018-10-26 — End: 2018-10-28
  Administered 2018-10-26 – 2018-10-27 (×3): 10 mL via INTRAVENOUS
  Administered 2018-10-27: 0 mL via INTRAVENOUS
  Administered 2018-10-28: 10 mL via INTRAVENOUS

## 2018-10-26 MED ORDER — SODIUM CHLORIDE 0.9 % INTRAVENOUS SOLUTION
INTRAVENOUS | Status: AC
Start: 2018-10-26 — End: 2018-10-27
  Administered 2018-10-26: 21:00:00 via INTRAVENOUS

## 2018-10-26 MED ORDER — NITROGLYCERIN 0.4 MG SUBLINGUAL TABLET
0.4000 mg | SUBLINGUAL_TABLET | SUBLINGUAL | Status: DC | PRN
Start: 2018-10-26 — End: 2018-10-28

## 2018-10-26 MED ORDER — AMLODIPINE 5 MG TABLET
5.0000 mg | ORAL_TABLET | Freq: Every day | ORAL | Status: DC
Start: 2018-10-27 — End: 2018-10-28
  Administered 2018-10-27 – 2018-10-28 (×2): 5 mg via ORAL
  Filled 2018-10-26 (×2): qty 1

## 2018-10-26 MED ORDER — CEFTRIAXONE 1 GRAM/50 ML IN DEXTROSE (ISO-OSMOT) INTRAVENOUS PIGGYBACK
1.0000 g | INJECTION | INTRAVENOUS | Status: DC
Start: 2018-10-26 — End: 2018-10-26
  Administered 2018-10-26: 21:00:00
  Filled 2018-10-26: qty 50

## 2018-10-26 MED ORDER — IPRATROPIUM BROMIDE 0.02 % SOLUTION FOR INHALATION
0.50 mg | Freq: Four times a day (QID) | RESPIRATORY_TRACT | Status: DC
Start: 2018-10-26 — End: 2018-10-28
  Administered 2018-10-26: 22:00:00 via RESPIRATORY_TRACT
  Administered 2018-10-27 (×4): 0.5 mg via RESPIRATORY_TRACT
  Administered 2018-10-28: 12:00:00
  Administered 2018-10-28: 0.5 mg via RESPIRATORY_TRACT
  Filled 2018-10-26 (×5): qty 1

## 2018-10-26 MED ORDER — SODIUM CHLORIDE 0.9% FLUSH BAG - 250 ML
INTRAVENOUS | Status: DC | PRN
Start: 2018-10-26 — End: 2018-10-28

## 2018-10-26 MED ORDER — INSULIN GLARGINE (U-100) 100 UNIT/ML SUBCUTANEOUS SOLUTION
15.00 [IU] | Freq: Two times a day (BID) | SUBCUTANEOUS | Status: DC
Start: 2018-10-26 — End: 2018-10-28
  Administered 2018-10-26 – 2018-10-28 (×4): 15 [IU] via SUBCUTANEOUS
  Filled 2018-10-26: qty 200

## 2018-10-26 MED ORDER — IOPAMIDOL 370 MG IODINE/ML (76 %) INTRAVENOUS SOLUTION
100.0000 mL | INTRAVENOUS | Status: AC
Start: 2018-10-26 — End: 2018-10-26
  Administered 2018-10-26: 19:00:00 85 mL via INTRAVENOUS
  Filled 2018-10-26: qty 100

## 2018-10-26 MED ORDER — ALBUTEROL SULFATE CONCENTRATE 2.5 MG/0.5 ML SOLUTION FOR NEBULIZATION
5.00 mg | INHALATION_SOLUTION | Freq: Four times a day (QID) | RESPIRATORY_TRACT | Status: DC
Start: 2018-10-26 — End: 2018-10-28
  Administered 2018-10-26: 22:00:00 via RESPIRATORY_TRACT
  Administered 2018-10-27 (×4): 5 mg via RESPIRATORY_TRACT
  Administered 2018-10-28: 12:00:00
  Administered 2018-10-28: 5 mg via RESPIRATORY_TRACT
  Filled 2018-10-26 (×4): qty 2

## 2018-10-26 MED ORDER — ONDANSETRON HCL (PF) 4 MG/2 ML INJECTION SOLUTION
4.0000 mg | Freq: Four times a day (QID) | INTRAMUSCULAR | Status: DC | PRN
Start: 2018-10-26 — End: 2018-10-26

## 2018-10-26 NOTE — ED Triage Notes (Addendum)
Pt presents with c/o SOB since yesterday.  States she took care of her grandson yesterday and developed increasing SOB today.  Pt states she has a hx of COPD, was told to come to the ED for breathing treatments as her nebulizer quit working. Pt using accessory muscles, appears SOB at triage, c/o L sided chest pain that is worse with a deep breath

## 2018-10-26 NOTE — ED Nurses Note (Signed)
Report called to 6th floor, patient ready for transport to [facility].

## 2018-10-26 NOTE — ED Provider Notes (Signed)
Melinda Oiler, MD  [facility]  Emergency Department Visit Note    Date:  10/26/2018  Primary care provider:  Hubbard Robinson, DO  Means of arrival:  private car  History obtained from: patient  History limited by: none    Chief Complaint:  Shortness of breath    HISTORY OF PRESENT ILLNESS     [name], date of birth August 05, 1955, is a 63 y.o. female who presents to the Emergency Department complaining of  Shortness of breath. Patient reports shortness of breath since yesterday which has become worse today. She also reports a productive cough. Patient states that over the last week her grandson was over and notes he had a "cold". Patient denies any abdominal pain, vomiting, diarrhea, urinary symptoms, back pain, headaches, or unilateral weakness/numbness. Patient has a history of COPD and Hypertension.     REVIEW OF SYSTEMS     The pertinent positive and negative symptoms are as per HPI. All other systems reviewed and are negative.     PATIENT HISTORY     Past Medical History:  Past Medical History:   Diagnosis Date   . COPD (chronic obstructive pulmonary disease) (CMS HCC)    . Degenerative joint disease involving multiple joints    . Diabetes mellitus (CMS [place] [place])    . HTN (hypertension)    . Smoker    . 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 Wears glasses      Past Surgical History:  Past Surgical History:   Procedure Laterality Date   . Hx carpal tunnel release       Family History:  Family Medical History:     Problem Relation (Age of Onset)    COPD Mother    Coronary Artery Disease Father    Diabetes Mother, Sister, Brother, Paternal Grandmother        Social History:  Social History     Tobacco Use   . Smoking status: Current Every Day Smoker     Packs/day: 1.00     Years: 20.00     Pack years: 20.00     Types: Cigarettes   . Smokeless tobacco: Never Used   Substance Use Topics   . Alcohol use: No   . Drug use: No     Social History     Substance and Sexual Activity   Drug Use No          Medications:  Current Outpatient Medications   Medication Sig   . albuterol (PROVENTIL) 2.5 mg/0.5 mL Inhalation Solution for Nebulization 2.5 mg by Nebulization route Four times a day   . albuterol (PROVENTIL) 2.5 mg/0.5 mL Inhalation Solution for Nebulization USE 1 VIAL IN NEBULIZER EVERY 4 HOURS   . amLODIPine (NORVASC) 10 mg Oral Tablet TAKE 1 TABLET BY MOUTH ONCE DAILY   . aspirin 81 mg Oral Tablet, Chewable Take 1 Tab (81 mg total) by mouth Once a day   . atorvastatin (LIPITOR) 40 mg Oral Tablet Take 1 Tab (40 mg total) by mouth Once a day   . ATROVENT HFA 17 mcg/actuation Inhalation HFA Aerosol Inhaler oral inhaler INHALE 2 PUFFS BY MOUTH 4 TIMES DAILY   . ATROVENT HFA 17 mcg/actuation Inhalation HFA Aerosol Inhaler oral inhaler INHALE 2 PUFFS BY MOUTH 4 TIMES DAILY   . fluticasone propion-salmeteroL (ADVAIR) 500-50 mcg/dose Inhalation Disk with Device oral diskus inhaler INHALE 1 DOSE BY MOUTH TWICE DAILY   . furosemide (  LASIX) 20 mg Oral Tablet Take 1/2 (one-half) tablet by mouth once daily   . glimepiride (AMARYL) 4 mg Oral Tablet TAKE 1 TABLET BY MOUTH ONCE DAILY IN THE MORNING   . ipratropium (ATROVENT) 0.02 % Inhalation Solution 2.5 mL (0.5 mg total) by Nebulization route ONCE EVERY 4 HOURS   . ipratropium bromide (ATROVENT HFA) 17 mcg/actuation Inhalation HFA Aerosol Inhaler oral inhaler Take 2 INHALATION (2 Puffs total) by inhalation Four times a day   . JANUVIA 100 mg Oral Tablet Take 1 tablet by mouth once daily   . lisinopriL (PRINIVIL) 5 mg Oral Tablet Take 1 tablet by mouth once daily   . pioglitazone (ACTOS) 15 mg Oral Tablet Take 1 tablet by mouth once daily     Allergies:  Allergies   Allergen Reactions   . Codeine Nausea/ Vomiting     Sick to stomach   . Hydrocodone Nausea/ Vomiting   . Metformin Diarrhea   . Oxycodone Nausea/ Vomiting     PHYSICAL EXAM     Vitals:  Filed Vitals:    10/26/18 1641   BP: (!) 145/67   Pulse: (!) 124   Resp: (!) 36   Temp: 37.7 C (99.8 F)   SpO2: (!) 88%      Pulse ox  88% on None (Room Air) interpreted by me as: Abnormal    Constitutional: Slightly tachypneic.   Head: Normocephalic and atraumatic.   ENT: Moist mucous membranes. No erythema or exudates in the oropharynx. Normal voice  Eyes: EOM are normal. Pupils are equal, round, and reactive to light. No scleral icterus.   Neck: Neck supple. FROM neck.  Cardiovascular: Normal rate and regular rhythm. No murmur heard. 2+ distal pulses all 4 extremities.  Pulmonary/Chest:  Mild wheezing bilaterally, no crackles. Slightly tachypneic.   Abdominal: Soft with no distension. No abdominal tenderness  Back: There is no CVA tenderness.   Musculoskeletal:  No clubbing or cyanosis.  Neurological: Patient is alert and awake. Strength and sensation normal in all extremities. Normal facial symmetry and speech.   Skin: Skin is warm and dry. Warm to touch.     DIAGNOSTIC STUDIES     Labs:    Results for orders placed or performed during the hospital encounter of 10/26/18   RESPIRATORY VIRUS PANEL BY BIOFIRE FILM ARRAY    Specimen: Nasopharyngeal Swab   Result Value Ref Range    ADENOVIRUS ARRAY Not Detected Not Detected    CORONAVIRUS 229E Not Detected Not Detected    CORONAVIRUS HKU1 Not Detected Not Detected    CORONAVIRUS NL63 Not Detected Not Detected    CORONAVIRUS OC43 Not Detected Not Detected    SARS CORONAVIRUS 2 (SARS-CoV-2) Not Detected Not Detected    METAPNEUMOVIRUS ARRAY Not Detected Not Detected    RHINOVIRUS/ENTEROVIRUS ARRAY Not Detected Not Detected    INFLUENZA A Not Detected Not Detected    INFLUENZA B ARRAY Not Detected Not Detected    PARAINFLUENZA 1 ARRAY Not Detected Not Detected    PARAINFLUENZA 2 ARRAY Not Detected Not Detected    PARAINFLUENZA 3 ARRAY Not Detected Not Detected    PARAINFLUENZA 4 ARRAY Not Detected Not Detected    RSV ARRAY Not Detected Not Detected    BORDETELLA PERTUSSIS ARRAY Not Detected Not Detected    CHLAMYDOPHILA PNEUMONIAE ARRAY Not Detected Not Detected    MYCOPLASMA PNEUMONIAE  ARRAY Not Detected Not Detected   COMPREHENSIVE METABOLIC PROFILE - [facility] ONLY   Result Value Ref Range  SODIUM 130 (L) 136 - 145 mmol/L    POTASSIUM 3.5 3.4 - 5.1 mmol/L    CHLORIDE 93 (L) 101 - 111 mmol/L    CO2 TOTAL 25 22 - 32 mmol/L    ANION GAP 12 (H) 3 - 11 mmol/L    BUN 9 6 - 20 mg/dL    CREATININE 0.65 0.44 - 1.00 mg/dL    BUN/CREA RATIO 14 6 - 22    ESTIMATED GFR >60 >60 mL/min/1.11m^2    ALBUMIN 3.8 3.5 - 5.0 g/dL    CALCIUM 9.0 8.8 - 10.2 mg/dL    GLUCOSE 405 (HH) 70 - 110 mg/dL    ALKALINE PHOSPHATASE 100 38 - 126 U/L    ALT (SGPT) 14 14 - 54 U/L    AST (SGOT) 11 (L) 15 - 41 U/L    BILIRUBIN TOTAL 1.3 (H) 0.3 - 1.2 mg/dL    PROTEIN TOTAL 7.0 6.4 - 8.3 g/dL    ALBUMIN/GLOBULIN RATIO 1.2 0.8 - 2.0   TROPONIN-I   Result Value Ref Range    TROPONIN I <0.03 <=0.06 ng/mL   CBC WITH DIFF   Result Value Ref Range    WBC 24.7 (H) 4.0 - 11.0 x10^3/uL    RBC 4.93 4.00 - 5.10 x10^6/uL    HGB 15.1 12.0 - 15.5 g/dL    HCT 44.2 36.0 - 45.0 %    MCV 89.8 82.0 - 97.0 fL    MCH 30.6 27.5 - 33.2 pg    MCHC 34.0 32.0 - 36.0 g/dL    RDW 14.0 11.0 - 16.0 %    PLATELETS 268 150 - 450 x10^3/uL    MPV 8.1 7.4 - 10.5 fL   MANUAL DIFFERENTIAL   Result Value Ref Range    NEUTROPHIL % 81 (H) 43 - 76 %    LYMPHOCYTE % 8 (L) 15 - 43 %    MONOCYTE % 9 5 - 12 %    BAND % 2 0 - 4 %    NEUTROPHIL ABSOLUTE 20.50 (H) 1.50 - 6.50 x10^3/uL    LYMPHOCYTE ABSOLUTE 1.98 1.00 - 4.80 x10^3/uL    MONOCYTE ABSOLUTE 2.22 (H) 0.20 - 0.90 x10^3/uL    PLATELET ESTIMATE Adequate     RBC MORPHOLOGY COMMENT Normal     WBC MORPHOLOGY COMMENT Normal     WBC 24.7 x10^3/uL   LACTIC ACID LEVEL W/ REFLEX FOR LEVEL >2.0   Result Value Ref Range    LACTIC ACID 1.2 0.5 - 2.0 mmol/L   PROCALCITONIN   Result Value Ref Range    PROCALCITONIN  0.11 <0.50 ng/mL     Labs reviewed and interpreted by me.    BLOOD CULTURE - [place] - results pending at time of disposition  BLOOD CULTURE - [place] - results pending at time of disposition    Radiology:    CT ANGIO  CHEST FOR PULMONARY EMBOLUS W IV CONTRAST   Final Result   1. No pulmonary embolism.    2. There is a subsegmental dense consolidation involving the left upper   lobe anteriorly concerning for pneumonia. Follow-up radiographs are   recommended to document complete resolution.   3. No lung nodules. The nodular lesion seen on the chest x-ray is likely   related to the consolidation described above. Extensive emphysema. Patient   may be a candidate for annual low-dose lung cancer screening.  Radiologist location ID: [id number]         XR AP MOBILE CHEST (If patient condition warrants)   Final Result   Mild-to-moderate emphysema. There is a possible 1 cm irregular   lung nodule left perihilar region. CT chest is recommended.                        Radiologist location ID: [id number]           XR AP MOBILE CHEST  Radiological imaging interpreted by radiologist and independently reviewed by me.    CT ANGIO CHEST FOR PULMONARY EMBOLUS W IV CONTRAST  Radiological imaging interpreted by radiologist and report reviewed by me.    EKG:  12 lead EKG interpreted by me shows sinus tachycardia, rate of 120 bpm, ST depression in the inferolateral leads. No ST elevations. QTC interval is 511.     ED PROGRESS NOTE / [place] records reviewed by me:  I have reviewed the nurse's notes. I have reviewed the patient's problem list and pertinent past medical records.     Orders Placed This Encounter   . ADULT ROUTINE BLOOD CULTURE, SET OF 2 BOTTLES (BACTERIA AND YEAST)   . ADULT ROUTINE BLOOD CULTURE, SET OF 2 BOTTLES (BACTERIA AND YEAST)   . RESPIRATORY VIRUS PANEL BY BIOFIRE FILM ARRAY   . XR AP MOBILE CHEST (If patient condition warrants)   . CT ANGIO CHEST FOR PULMONARY EMBOLUS W IV CONTRAST   . COMPREHENSIVE METABOLIC PROFILE - [facility] ONLY   . TROPONIN-I   . CBC WITH DIFF   . MANUAL DIFFERENTIAL   . LACTIC ACID LEVEL W/ REFLEX FOR LEVEL >2.0   . TROPONIN-I   . PROCALCITONIN   . OXYGEN - NASAL  CANNULA (Administer oxygen if clinically necessary - obtain pulse ox on room air first)   . ECG 12-LEAD (Take to provider with a brief history)   . INSERT & MAINTAIN PERIPHERAL IV ACCESS   . LR bolus infusion 20 mL/kg = 1,712 mL   . cefTRIAXone (ROCEPHIN) 1 g in iso-osmotic 50 mL duplex IVPB   . azithromycin (ZITHROMAX) 500 mg in NS 250 mL IVPB          CXR, CBC, CMP, Troponin, and ECG were initially ordered.    17:35: Initial evaluation is complete at this time. Rectal temperature ws 102.2. I discussed with the patient that I would continue to review her previously ordered work up and add a Respiratory biofire panel, Procalcitonin, blood cultures, and Lactic acid level to further evaluate. Will treat her with 1,712 mL of fluids, Zithromax, and  Rocephin. Patient is agreeable with the treatment plan at this time.     18:32: Labs and imaging thus far reviewed. CT angio of the chest for PE ordered.     19:32: On recheck, the patient is in no acute distress and is resting here. I explained the results of the diagnostic studies and plans for admission which she was agreeable with. Hospitalist paged.     19:37: I discussed the patient's case and above findings with Dr. Delora Fuel [facility]) who is making arrangements for admission.    109:58: Tylenol ordered.     MIPS     Not applicable     OPIATE PRESCRIPTION      Not applicable    CORE MEASURES     19:33: I have declared Sepsis at this time.   19:33: I have verbally  notified Arna Snipe, RN that I have declared sepsis.  17:44 I have ordered Zithromax and Rocephin.  Antibiotics to be administered after blood cultures have been drawn.  17:44I have ordered 20 ml/kg based off of the ideal body weight of LR.    CRITICAL CARE TIME     Total Critical Care Time for this patient is 40 minutes and it is exclusive of procedural time.    PRE-DISPOSITION VITALS      Pre-Disposition Vitals:  Filed Vitals:    10/26/18 1715 10/26/18 1800 10/26/18 1815 10/26/18 1830   BP:   (!)  135/59 (!) 153/63   Pulse: (!) 117 (!) 118 (!) 117 (!) 113   Resp: 18 20 (!) 29 18   Temp:       SpO2: 95% 92% 92% 91%     CLINICAL IMPRESSION     1. Left upper lobe pneumonia   2. Hypoxia   3. Leukocytosis  4. Sepsis    DISPOSITION/PLAN     Admitted      Condition at Disposition: [place], SCRIBE scribed for Melinda Oiler, MD on 10/26/2018 at 5:24 PM.     Documentation assistance provided for Melinda Oiler, MD  by Nance Pear, [place]. Information recorded by the scribe was done at my direction and has been reviewed and validated by me Melinda Oiler, MD.

## 2018-10-26 NOTE — H&P (Signed)
[facility]  [place], [place] [address]    General History and Physical    Manuel, Cortese  Date of Admission:  10/26/2018   Date of Service:  10/26/2018   Date of Birth:  March 09, 1955    PCP: Hubbard Robinson, DO  Chief Complaint:  Shortness of breath    HPI: [name] is a 63 y.o., White female with a history of COPD, DM who presents with shortness of breath.  The patient indicates that about 2 days ago she started to developed increasing shortness of breath.  This was associated with a cough that is productive of yellow phlegm.  She has also had some subjective fevers and chills but she denies having any rhinorrhea, sore throat, myalgias, headache, anosmia or dysgeusia.  She indicates that her grandson visited her last week and he had a cold at that time.  She denies having any abdominal pain, nausea, vomiting, diarrhea, orthopnea, PND, peripheral edema.  She does have some left-sided chest pain that is worse with a deep inspiration as well.  She came to the ER for further evaluation where she was noted to be tachycardic, tachypneic and febrile to 102.  A CTA of the chest was negative for pulmonary embolism but did show a left upper lobe pneumonia and a respiratory virus biofire was negative for pathogens, including negative for COVID-19.  She was given IV Rocephin and Zithromax then admitted for further care.      Patient Active Problem List    Diagnosis Date Noted   . CAP (community acquired pneumonia) 10/26/2018   . Acute exacerbation of chronic obstructive pulmonary disease (COPD) (CMS HCC) 03/27/2018   . COPD with acute exacerbation (CMS HCC) 05/23/2017   . Tobacco use disorder 05/22/2017   . Acute respiratory failure (CMS HCC) 05/21/2017   . Non-compliance 03/27/2017   . 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 Diabetes (CMS [place]) 11/18/2015   . Obesity (BMI 35.0-39.9 without comorbidity) 10/08/2012       Past Medical History:   Diagnosis Date   . COPD (chronic obstructive  pulmonary disease) (CMS HCC)    . Degenerative joint disease involving multiple joints    . Diabetes mellitus (CMS [place])    . HTN (hypertension)    . Smoker    . 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 Wears glasses            Past Surgical History:   Procedure Laterality Date   . HX CARPAL TUNNEL RELEASE              Medications Prior to Admission     Prescriptions    albuterol (PROVENTIL) 2.5 mg/0.5 mL Inhalation Solution for Nebulization    2.5 mg by Nebulization route Four times a day    albuterol (PROVENTIL) 2.5 mg/0.5 mL Inhalation Solution for Nebulization    USE 1 VIAL IN NEBULIZER EVERY 4 HOURS    amLODIPine (NORVASC) 10 mg Oral Tablet    TAKE 1 TABLET BY MOUTH ONCE DAILY    aspirin 81 mg Oral Tablet, Chewable    Take 1 Tab (81 mg total) by mouth Once a day    atorvastatin (LIPITOR) 40 mg Oral Tablet    Take 1 Tab (40 mg total) by mouth Once a day    ATROVENT HFA 17 mcg/actuation Inhalation HFA Aerosol Inhaler oral inhaler    INHALE 2 PUFFS BY MOUTH 4 TIMES  DAILY    ATROVENT HFA 17 mcg/actuation Inhalation HFA Aerosol Inhaler oral inhaler    INHALE 2 PUFFS BY MOUTH 4 TIMES DAILY    fluticasone propion-salmeteroL (ADVAIR) 500-50 mcg/dose Inhalation Disk with Device oral diskus inhaler    INHALE 1 DOSE BY MOUTH TWICE DAILY    furosemide (LASIX) 20 mg Oral Tablet    Take 1/2 (one-half) tablet by mouth once daily    glimepiride (AMARYL) 4 mg Oral Tablet    TAKE 1 TABLET BY MOUTH ONCE DAILY IN THE MORNING    ipratropium (ATROVENT) 0.02 % Inhalation Solution    2.5 mL (0.5 mg total) by Nebulization route ONCE EVERY 4 HOURS    ipratropium bromide (ATROVENT HFA) 17 mcg/actuation Inhalation HFA Aerosol Inhaler oral inhaler    Take 2 INHALATION (2 Puffs total) by inhalation Four times a day    JANUVIA 100 mg Oral Tablet    Take 1 tablet by mouth once daily    lisinopriL (PRINIVIL) 5 mg Oral Tablet    Take 1 tablet by mouth once daily    pioglitazone (ACTOS) 15 mg Oral Tablet    Take 1 tablet by mouth  once daily        acetaminophen (TYLENOL) tablet, 1,000 mg, Oral, Now  azithromycin (ZITHROMAX) 500 mg in NS 250 mL IVPB, 500 mg, Intravenous, Q24H  cefTRIAXone (ROCEPHIN) 1 g in iso-osmotic 50 mL duplex IVPB, 1 g, Intravenous, Q24H        Allergies   Allergen Reactions   . Codeine Nausea/ Vomiting     Sick to stomach   . Hydrocodone Nausea/ Vomiting   . Metformin Diarrhea   . Oxycodone Nausea/ Vomiting       Social History     Tobacco Use   . Smoking status: Current Every Day Smoker     Packs/day: 1.00     Years: 20.00     Pack years: 20.00     Types: Cigarettes   . Smokeless tobacco: Never Used   Substance Use Topics   . Alcohol use: No       Family Medical History:     Problem Relation (Age of Onset)    COPD Mother    Coronary Artery Disease Father    Diabetes Mother, Sister, Brother, Paternal Grandmother              ROS: Other than ROS in the HPI, all other systems were negative.    EXAM:  Temperature: 37.7 C (99.8 F)  Heart Rate: (!) 113  BP (Non-Invasive): (!) 153/63  Respiratory Rate: 18  SpO2: 91 %  Pain Score (Numeric, Faces): 6  General: no acute distress, resting comfortably, appears older than stated age.   Eyes: Pupils equal and round, reactive to light. Anicteric  HEENT: Head atraumatic and normocephalic, MMM  Neck: No JVD or thyromegaly or lymphadenopathy   Lungs: Tachypneic but not using accessory muscles. Prolonged expiratory phase. No wheezing, rales or rhonchi.   Cardiovascular: normal rate, regular rhythm, S1, S2 normal, no murmur appreciated  Abdomen: Soft, non-tender, non-distended, bowel sounds normal  Extremities: extremities normal, atraumatic, no cyanosis. DP pulses 1+ and equal bilaterally. Trace edema bilaterally.   Skin: Skin warm and dry   Neurologic: Alert, oriented, no focal deficit, CN II-XII grossly intact  Lymphatics: No lymphadenopathy   Psychiatric: Normal affect, behavior       Labs:    I have reviewed all lab results.  Lab Results for Last 24 Hours:    Results  for orders  placed or performed during the hospital encounter of 10/26/18 (from the past 24 hour(s))   COMPREHENSIVE METABOLIC PROFILE - [facility] ONLY   Result Value Ref Range    SODIUM 130 (L) 136 - 145 mmol/L    POTASSIUM 3.5 3.4 - 5.1 mmol/L    CHLORIDE 93 (L) 101 - 111 mmol/L    CO2 TOTAL 25 22 - 32 mmol/L    ANION GAP 12 (H) 3 - 11 mmol/L    BUN 9 6 - 20 mg/dL    CREATININE 0.65 0.44 - 1.00 mg/dL    BUN/CREA RATIO 14 6 - 22    ESTIMATED GFR >60 >60 mL/min/1.47m^2    ALBUMIN 3.8 3.5 - 5.0 g/dL    CALCIUM 9.0 8.8 - 10.2 mg/dL    GLUCOSE 405 (HH) 70 - 110 mg/dL    ALKALINE PHOSPHATASE 100 38 - 126 U/L    ALT (SGPT) 14 14 - 54 U/L    AST (SGOT) 11 (L) 15 - 41 U/L    BILIRUBIN TOTAL 1.3 (H) 0.3 - 1.2 mg/dL    PROTEIN TOTAL 7.0 6.4 - 8.3 g/dL    ALBUMIN/GLOBULIN RATIO 1.2 0.8 - 2.0   TROPONIN-I   Result Value Ref Range    TROPONIN I <0.03 <=0.06 ng/mL   CBC WITH DIFF   Result Value Ref Range    WBC 24.7 (H) 4.0 - 11.0 x10^3/uL    RBC 4.93 4.00 - 5.10 x10^6/uL    HGB 15.1 12.0 - 15.5 g/dL    HCT 44.2 36.0 - 45.0 %    MCV 89.8 82.0 - 97.0 fL    MCH 30.6 27.5 - 33.2 pg    MCHC 34.0 32.0 - 36.0 g/dL    RDW 14.0 11.0 - 16.0 %    PLATELETS 268 150 - 450 x10^3/uL    MPV 8.1 7.4 - 10.5 fL   BLUE TOP TUBE   Result Value Ref Range    RAINBOW/EXTRA TUBE AUTO RESULT Yes    MANUAL DIFFERENTIAL   Result Value Ref Range    NEUTROPHIL % 81 (H) 43 - 76 %    LYMPHOCYTE % 8 (L) 15 - 43 %    MONOCYTE % 9 5 - 12 %    BAND % 2 0 - 4 %    NEUTROPHIL ABSOLUTE 20.50 (H) 1.50 - 6.50 x10^3/uL    LYMPHOCYTE ABSOLUTE 1.98 1.00 - 4.80 x10^3/uL    MONOCYTE ABSOLUTE 2.22 (H) 0.20 - 0.90 x10^3/uL    PLATELET ESTIMATE Adequate     RBC MORPHOLOGY COMMENT Normal     WBC MORPHOLOGY COMMENT Normal     WBC 24.7 x10^3/uL   PROCALCITONIN   Result Value Ref Range    PROCALCITONIN  0.11 <0.50 ng/mL   LACTIC ACID LEVEL W/ REFLEX FOR LEVEL >2.0   Result Value Ref Range    LACTIC ACID 1.2 0.5 - 2.0 mmol/L   RESPIRATORY VIRUS PANEL BY BIOFIRE FILM ARRAY    Specimen:  Nasopharyngeal Swab   Result Value Ref Range    ADENOVIRUS ARRAY Not Detected Not Detected    CORONAVIRUS 229E Not Detected Not Detected    CORONAVIRUS HKU1 Not Detected Not Detected    CORONAVIRUS NL63 Not Detected Not Detected    CORONAVIRUS OC43 Not Detected Not Detected    SARS CORONAVIRUS 2 (SARS-CoV-2) Not Detected Not Detected    METAPNEUMOVIRUS ARRAY Not Detected Not Detected    RHINOVIRUS/ENTEROVIRUS ARRAY Not Detected Not Detected    INFLUENZA  A Not Detected Not Detected    INFLUENZA B ARRAY Not Detected Not Detected    PARAINFLUENZA 1 ARRAY Not Detected Not Detected    PARAINFLUENZA 2 ARRAY Not Detected Not Detected    PARAINFLUENZA 3 ARRAY Not Detected Not Detected    PARAINFLUENZA 4 ARRAY Not Detected Not Detected    RSV ARRAY Not Detected Not Detected    BORDETELLA PERTUSSIS ARRAY Not Detected Not Detected    CHLAMYDOPHILA PNEUMONIAE ARRAY Not Detected Not Detected    MYCOPLASMA PNEUMONIAE ARRAY Not Detected Not Detected       Imaging Studies:    CXR: Mild-to-moderate emphysema. There is a possible 1 cm irregular lung nodule left perihilar region. CT chest is recommended.    CTA Chest:   1. No pulmonary embolism.   2. There is a subsegmental dense consolidation involving the left upper lobe anteriorly concerning for pneumonia. Follow-up radiographs are recommended to document complete resolution.   3. No lung nodules. The nodular lesion seen on the chest x-ray is likely related to the consolidation described above. Extensive emphysema. Patient may be a candidate for annual low-dose lung cancer screening.    ECG: per my read - ST, HR 120, normal axis, prolonged QTc, no pathologic Q-waves, diffuse ST segment flattening, no ST depression or elevation.     Assessment/Plan:   [name] is a 63 y.o., White female who presents with the following:    1. CAP:  - Located in left upper lobe, likely strep species.  - Continue IV Rocephin and Zithromax.  - Blood cultures pending. COVID negative. Check sputum  culture and urine strep/legionella antigens.  - PRN Tessalon. Incentive spirometry and flutter valve.    2. Sepsis:  - Does not meet criteria for severe sepsis. Sepsis is due to #1.   - Received a 30 cc/kg bolus per ideal body weight in the ED.  - Lactic acid is normal  - Continue IV fluids and IV antibiotics. Monitor culture data.     3. COPD:  - No acute exacerbation.  - Resume Duonebs and PRN Albuterol.  - Using Symbicort in place of home Advair due to hospital formulary.   - Incentive spirometry.     4. Left Adrenal Adenoma:   - Noted on CTA chest.   - Can be referred to Endocrine as an outpatient for continued monitoring and evaluation of hormonal secretion, but no obvious symptoms at this time.     5. Type II DM with Hyperglycemia, Uncontrolled:  - Will place on Lantus with SSI due to hyperglycemia while ill.  - Check A1C. Resume oral medications when clinically improved.     6. HTN, Benign: resume Norvasc at lower dose of 5 mg and holding Lisinopril due to sepsis. Resume after a trend in BP is determined.     7. HLD: resume Lipitor.     8. Chronic Respiratory Failure with Hypoxia: continue supplemental oxygen and treat acute issues noted above.     DVT PPx: Lovenox  Code Status:  FULL    Margarette Asal, MD       Portions of this note may be dictated using voice recognition software or a dictation service. Variances in spelling and vocabulary are possible and unintentional. Not all errors are caught/corrected. Please notify the [facility] if any discrepancies are noted or if the meaning of any statement is not clear.

## 2018-10-26 NOTE — Care Plan (Signed)
Problem: Adult Inpatient Plan of Care  Goal: Plan of Care Review  10/26/2018 2133 by Girtha Hake, Judye Bos, RN  Outcome: Ongoing (see interventions/notes)  10/26/2018 2133 by Girtha Hake, Judye Bos, RN  Outcome: Ongoing (see interventions/notes)  Goal: Patient-Specific Goal (Individualized)  10/26/2018 2133 by Girtha Hake, Judye Bos, RN  Outcome: Ongoing (see interventions/notes)  10/26/2018 2133 by Girtha Hake, Judye Bos, RN  Outcome: Ongoing (see interventions/notes)  Goal: Absence of Hospital-Acquired Illness or Injury  10/26/2018 2133 by Girtha Hake, Judye Bos, RN  Outcome: Ongoing (see interventions/notes)  10/26/2018 2133 by Girtha Hake, Judye Bos, RN  Outcome: Ongoing (see interventions/notes)  Goal: Optimal Comfort and Wellbeing  10/26/2018 2133 by Girtha Hake, Judye Bos, RN  Outcome: Ongoing (see interventions/notes)  10/26/2018 2133 by Girtha Hake, Judye Bos, RN  Outcome: Ongoing (see interventions/notes)  Goal: Rounds/Family Conference  10/26/2018 2133 by Girtha Hake, Judye Bos, RN  Outcome: Ongoing (see interventions/notes)  10/26/2018 2133 by Girtha Hake, Judye Bos, RN  Outcome: Ongoing (see interventions/notes)     Problem: Fluid Imbalance (Pneumonia)  Goal: Fluid Balance  10/26/2018 2133 by Girtha Hake, Judye Bos, RN  Outcome: Ongoing (see interventions/notes)  10/26/2018 2133 by Girtha Hake, Judye Bos, RN  Outcome: Ongoing (see interventions/notes)     Problem: Infection (Pneumonia)  Goal: Resolution of Infection Signs and Symptoms  10/26/2018 2133 by Girtha Hake, Judye Bos, RN  Outcome: Ongoing (see interventions/notes)  10/26/2018 2133 by Girtha Hake, Judye Bos, RN  Outcome: Ongoing (see interventions/notes)     Problem: Respiratory Compromise (Pneumonia)  Goal: Effective Oxygenation and Ventilation  10/26/2018 2133 by Girtha Hake, Judye Bos, RN  Outcome: Ongoing (see interventions/notes)  10/26/2018 2133 by Girtha Hake, Judye Bos, RN  Outcome: Ongoing (see interventions/notes)     Problem: Fall  Injury Risk  Goal: Absence of Fall and Fall-Related Injury  10/26/2018 2133 by Albina Billet, RN  Outcome: Ongoing (see interventions/notes)  10/26/2018 2133 by Albina Billet, RN  Outcome: Ongoing (see interventions/notes)

## 2018-10-26 NOTE — Care Plan (Signed)
Problem: Adult Inpatient Plan of Care  Goal: Plan of Care Review  Outcome: Ongoing (see interventions/notes)  Goal: Patient-Specific Goal (Individualized)  Outcome: Ongoing (see interventions/notes)  Goal: Absence of Hospital-Acquired Illness or Injury  Outcome: Ongoing (see interventions/notes)  Goal: Optimal Comfort and Wellbeing  Outcome: Ongoing (see interventions/notes)  Goal: Rounds/Family Conference  Outcome: Ongoing (see interventions/notes)     Problem: Fluid Imbalance (Pneumonia)  Goal: Fluid Balance  Outcome: Ongoing (see interventions/notes)     Problem: Infection (Pneumonia)  Goal: Resolution of Infection Signs and Symptoms  Outcome: Ongoing (see interventions/notes)     Problem: Respiratory Compromise (Pneumonia)  Goal: Effective Oxygenation and Ventilation  Outcome: Ongoing (see interventions/notes)     Problem: Fall Injury Risk  Goal: Absence of Fall and Fall-Related Injury  Outcome: Ongoing (see interventions/notes)

## 2018-10-26 NOTE — Nursing Note (Signed)
Order was faxed to [facility]. [facility] states that they do not carry a machine that is covered by patient's insurance. Called patient and informed her that they do not have one. She then requested it to be sent to [facility] in [place], and it was sent. She states that she is having pain when she is breathing and her O2 stat was 97%. She thinks that she has pneumonia. Instructed patient that she needs to go to the emergency room to be evaluated pneumonia can worsen quickly. Patient states she will go.    Maye Hides, LPN  [id number], [phone]

## 2018-10-27 DIAGNOSIS — A419 Sepsis, unspecified organism: Secondary | ICD-10-CM

## 2018-10-27 LAB — BASIC METABOLIC PANEL
ANION GAP: 11 mmol/L (ref 3–11)
BUN/CREA RATIO: 8 (ref 6–22)
BUN: 4 mg/dL — ABNORMAL LOW (ref 6–20)
CALCIUM: 8.3 mg/dL — ABNORMAL LOW (ref 8.8–10.2)
CHLORIDE: 97 mmol/L — ABNORMAL LOW (ref 101–111)
CO2 TOTAL: 27 mmol/L (ref 22–32)
CREATININE: 0.52 mg/dL (ref 0.44–1.00)
ESTIMATED GFR: >60 mL/min/{1.73_m2} (ref >60)
GLUCOSE: 209 mg/dL — ABNORMAL HIGH (ref 70–110)
POTASSIUM: 3.5 mmol/L (ref 3.4–5.1)
SODIUM: 135 mmol/L — ABNORMAL LOW (ref 136–145)

## 2018-10-27 LAB — PHOSPHORUS: PHOSPHORUS: 3.7 mg/dL (ref 2.7–4.5)

## 2018-10-27 LAB — CBC WITH DIFF
BASOPHIL #: 0 10*3/uL (ref 0.00–0.10)
BASOPHIL %: 0 % (ref 0–3)
EOSINOPHIL #: 0.1 10*3/uL (ref 0.00–0.50)
EOSINOPHIL %: 1 % (ref 0–5)
HCT: 42.4 % (ref 36.0–45.0)
HGB: 14 g/dL (ref 12.0–15.5)
LYMPHOCYTE #: 2.9 10*3/uL (ref 1.00–4.80)
LYMPHOCYTE %: 16 % (ref 15–43)
MCH: 30.3 pg (ref 27.5–33.2)
MCHC: 33 g/dL (ref 32.0–36.0)
MCV: 91.7 fL (ref 82.0–97.0)
MONOCYTE #: 2.6 10*3/uL — ABNORMAL HIGH (ref 0.20–0.90)
MONOCYTE %: 14 % — ABNORMAL HIGH (ref 5–12)
MPV: 9.1 fL (ref 7.4–10.5)
NEUTROPHIL #: 12.3 10*3/uL — ABNORMAL HIGH (ref 1.50–6.50)
NEUTROPHIL %: 69 % (ref 43–76)
PLATELETS: 232 10*3/uL (ref 150–450)
RBC: 4.62 10*6/uL (ref 4.00–5.10)
RDW: 13.8 % (ref 11.0–16.0)
WBC: 18 10*3/uL — ABNORMAL HIGH (ref 4.0–11.0)

## 2018-10-27 LAB — POC FINGERSTICK GLUCOSE - BMC/JMC (RESULTS)
GLUCOSE, POC: 211 mg/dL — ABNORMAL HIGH (ref 60–100)
GLUCOSE, POC: 193 mg/dL — ABNORMAL HIGH (ref 60–100)
GLUCOSE, POC: 330 mg/dL — ABNORMAL HIGH (ref 60–100)
GLUCOSE, POC: 297 mg/dL — ABNORMAL HIGH (ref 60–100)
GLUCOSE, POC: 323 mg/dL — ABNORMAL HIGH (ref 60–100)

## 2018-10-27 LAB — STREPTOCOCCUS PNEUMONIAE ANTIGEN,URINE: S.PNEUMONIA ANTIGEN: NEGATIVE

## 2018-10-27 LAB — MAGNESIUM: MAGNESIUM: 1.8 mg/dL (ref 1.6–2.6)

## 2018-10-27 LAB — LEGIONELLA URINE ANTIGEN: LEGIONELLA ANTIGEN: NEGATIVE

## 2018-10-27 NOTE — Progress Notes (Signed)
[place], [place] [address]    IP PROGRESS NOTE    Jobe Igo, FNP-C      [place], [place]  Date of Admission:  10/26/2018  Date of Birth:  [birth year]  Date of Service:  10/27/2018    COVID status:  Negative (tested 10/26/2018)    Admission Diagnosis: CAP    Brief history of current situation: 10/26/2018 - to ED and admitted with CAP after 2 days of increasing shortness of breath with cough and subjective fevers    Chief Complaint: short of breath    Subjective: lying in bed in semi-Fowler's position; above noted but states she has improved since yesterday    Assessment/ Plan:     Sepsis due to pneumonia:  --afebrile  --HR, BP, and RR continue to run a little high  --IV Zithromax and Rocephin continue   --leukocytosis improving  PLAN  --continue IV antibiotics as above  --encourage frequent mobility  --IS and flutter valve and encourage frequent use  --sepsis resolving; continue to follow protocol    COPD 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:  --wears oxygen at home but typically only at night  --home meds include Advair and duonebs  PLAN  --continue Symbicort in place of non-formulary Advair  --duonebs Q4H ATC  --scheduled budesonide and formoterol nebs Q12H  --IS and flutter valve; assist with and encourage frequent use  --encourage frequent mobility (ambulation and up in chair) to assist in pulmonary toileting    Left adrenal mass:  --incidental finding on CTA chest  --asymptomatic  PLAN  --follow-up with endocrine as OP    DM II, uncontrolled:  --glucose 405 on admission  --205-332 overnight  --last HA1c was 6.4 on 10/21/2018  --home meds include glimepiride, Januvia, and Actose  PLAN  --hold PO antidiabetics during ill phase  --resume when clinically improving  --continue Lantus as ordered on admission  --FSBS with SSIP  --recheck HA1C (ordered on admission)  --diabetic diet    HTN:  --BP moderately elevated  --home meds include amlodipine, Lasix, and lisinopril  PLAN  --hold Lasix and  lisinopril due to sepsis  --continue amlodipine at lower dose ordered on admission  --monitor BP trends      The following PMH issues are controlled and will be monitored:  - HLD - continue statin  - chronic respiratory failure with hypoxia; continue supplemental oxygen as needed  - obesity - per medical criteria guidelines with BMI of 34  14; continue to provide education concerning healthy lifestyle changes for weight reduction (to include, but not limited to, diet low in carbohydrates and saturated fats, 30 minutes of aerobic exercise 4-5 times per week, smoking cessation (both active and passive), co-morbidity control (ex. HTN, DM, HLD), and therapeutic stress reduction      Overall PLAN  Continue IV antibiotics  Aggressive pulmonary toileting  Hopeful for discharge tomorrow    Vital Signs:  Temp (24hrs) Max:37.7 C (99.8 F)      Temperature: 36.6 C (97.9 F)  BP (Non-Invasive): (!) 150/73  MAP (Non-Invasive): 95 mmHG  Heart Rate: (!) 102  Respiratory Rate: 20  Pain Score (Numeric, Faces): 3  SpO2: 94 %    Current Medications:  acetaminophen (TYLENOL) tablet, 650 mg, Oral, Q6H PRN  albuterol (PROVENTIL) 2.5mg / 0.5 mL nebulizer solution, 2.5 mg, Nebulization, Q4H PRN  albuterol (PROVENTIL) 2.5mg / 0.5 mL nebulizer solution, 5 mg, Nebulization, 4x/day    And  ipratropium (ATROVENT) 0.02% nebulizer solution, 0.5 mg, Nebulization, 4x/day  amLODIPine (NORVASC)  tablet, 5 mg, Oral, Daily  aspirin chewable tablet 81 mg, 81 mg, Oral, Daily  atorvastatin (LIPITOR) tablet, 40 mg, Oral, Daily  azithromycin (ZITHROMAX) 500 mg in NS 250 mL IVPB, 500 mg, Intravenous, Q24H  benzonatate (TESSALON) capsule, 200 mg, Oral, Q8H PRN  budesonide-formoterol (SYMBICORT) 160 mcg-4.5 mcg per inhalation oral inhaler - "Respiratory to administer", 2 Puff, Inhalation, 2x/day  cefTRIAXone (ROCEPHIN) 2 g in NS 50 mL IVPB, 2 g, Intravenous, Q24H  SSIP insulin lispro (HUMALOG) 100 units/mL injection, 1-8 Units, Subcutaneous, 4x/day AC     And  D10W bolus infusion 125 mL, 125 mL, Intravenous, Q15 Min PRN  enoxaparin (LOVENOX) 40 mg/0.4 mL SubQ injection, 40 mg, Subcutaneous, Daily  influenza virus vaccine (PF) IM injection (FLUARIX for ages 6 months through adult), 0.5 mL, IntraMUSCULAR, Once  insulin glargine (LANTUS) 100 units/mL injection, 15 Units, Subcutaneous, 2x/day  ketorolac (TORADOL) 15 mg/mL injection, 15 mg, Intravenous, Q6H PRN  nitroGLYCERIN (NITROSTAT) sublingual tablet, 0.4 mg, Sublingual, Q5 Min PRN  NS 250 mL flush bag, , Intravenous, Q1H PRN  NS flush syringe, 10 mL, Intravenous, Q8HRS  NS flush syringe, 10 mL, Intravenous, Q1H PRN  NS premix infusion, , Intravenous, Continuous  prochlorperazine (COMPAZINE) 5 mg/mL injection, 10 mg, Intravenous, Q6H PRN        Today's Physical Exam:   General: appears in fair health. No acute distress.   Eyes: Pupils equal and round, reactive to light and accomodation.   HENT: Head atraumatic and normocephalic   Neck: No JVD or thyromegaly or lymphadenopathy   Lungs: CTAB, non labored breathing, no rales or wheezing.    Cardiovascular: regular rate and rhythm, S1, S2 normal, no murmur   Abdomen: Soft, non-tender, bowel sounds normal   Extremities: extremities normal, atraumatic, no cyanosis or edema   Skin: Skin warm and dry   Neurologic: Grossly normal   Psychiatric: Normal affect, behavior     I/O:  I/O last 24 hours:      Intake/Output Summary (Last 24 hours) at 10/27/2018 1012  Last data filed at 10/27/2018 0808  Gross per 24 hour   Intake 2432 ml   Output 1050 ml   Net 1382 ml     I/O current shift:  10/17 0700 - 10/17 1859  In: 720 [P.O.:720]  Out: -       Labs  Please indicate ordered or reviewed)  Reviewed: I have reviewed all lab results.        Radiology Tests (Please indicate ordered or reviewed)  Interpreted by radiologist and individually reviewed by me:     10/26/2018    CXR:  IMPRESSION:  Mild-to-moderate emphysema. There is a possible 1 cm irregular  lung nodule left perihilar  region. CT chest is recommended.      CTA for pulmonary embolism:  IMPRESSION:  1. No pulmonary embolism.   2. There is a subsegmental dense consolidation involving the left upper  lobe anteriorly concerning for pneumonia. Follow-up radiographs are  recommended to document complete resolution.  3. No lung nodules. The nodular lesion seen on the chest x-ray is likely  related to the consolidation described above. Extensive emphysema. Patient  may be a candidate for annual low-dose lung cancer screening.          Problem List:  Active Hospital Problems   (*Primary Problem)    Diagnosis   . *CAP (community acquired pneumonia)   . Sepsis           Disposition:   Discharge will be  pending test results, need for further testing, or further complications.    Jobe Igo, FNP-C

## 2018-10-28 LAB — BASIC METABOLIC PANEL
ANION GAP: 9 mmol/L (ref 3–11)
BUN/CREA RATIO: 17 (ref 6–22)
BUN: 8 mg/dL (ref 6–20)
CALCIUM: 8.1 mg/dL — ABNORMAL LOW (ref 8.8–10.2)
CHLORIDE: 102 mmol/L (ref 101–111)
CO2 TOTAL: 26 mmol/L (ref 22–32)
CREATININE: 0.48 mg/dL (ref 0.44–1.00)
ESTIMATED GFR: >60 mL/min/{1.73_m2} (ref >60)
GLUCOSE: 238 mg/dL — ABNORMAL HIGH (ref 70–110)
POTASSIUM: 3.6 mmol/L (ref 3.4–5.1)
SODIUM: 137 mmol/L (ref 136–145)

## 2018-10-28 LAB — CBC WITH DIFF
BASOPHIL #: 0 10*3/uL (ref 0.00–0.10)
BASOPHIL %: 0 % (ref 0–3)
EOSINOPHIL #: 0.3 10*3/uL (ref 0.00–0.50)
EOSINOPHIL %: 3 % (ref 0–5)
HCT: 37.1 % (ref 36.0–45.0)
HGB: 12.3 g/dL (ref 12.0–15.5)
LYMPHOCYTE #: 2.1 10*3/uL (ref 1.00–4.80)
LYMPHOCYTE %: 19 % (ref 15–43)
MCH: 30.4 pg (ref 27.5–33.2)
MCHC: 33.2 g/dL (ref 32.0–36.0)
MCV: 91.6 fL (ref 82.0–97.0)
MONOCYTE #: 1.5 10*3/uL — ABNORMAL HIGH (ref 0.20–0.90)
MONOCYTE %: 13 % — ABNORMAL HIGH (ref 5–12)
MPV: 9.2 fL (ref 7.4–10.5)
NEUTROPHIL #: 7.4 10*3/uL — ABNORMAL HIGH (ref 1.50–6.50)
NEUTROPHIL %: 65 % (ref 43–76)
PLATELETS: 213 10*3/uL (ref 150–450)
RBC: 4.05 10*6/uL (ref 4.00–5.10)
RDW: 13.9 % (ref 11.0–16.0)
WBC: 11.3 10*3/uL — ABNORMAL HIGH (ref 4.0–11.0)

## 2018-10-28 LAB — HGA1C (HEMOGLOBIN A1C WITH EST AVG GLUCOSE)
ESTIMATED AVERAGE GLUCOSE: 298 mg/dL
HEMOGLOBIN A1C: 12 % — ABNORMAL HIGH (ref 4.0–5.6)

## 2018-10-28 LAB — PROCALCITONIN: PROCALCITONIN: <0.05 ng/mL (ref <0.50)

## 2018-10-28 LAB — POC FINGERSTICK GLUCOSE - BMC/JMC (RESULTS): GLUCOSE, POC: 206 mg/dL — ABNORMAL HIGH (ref 60–100)

## 2018-10-28 MED ORDER — AMOXICILLIN 875 MG-POTASSIUM CLAVULANATE 125 MG TABLET
1.00 | ORAL_TABLET | Freq: Two times a day (BID) | ORAL | 0 refills | Status: AC
Start: 2018-10-28 — End: 2018-11-04

## 2018-10-28 MED ORDER — BENZONATATE 200 MG CAPSULE
200.0000 mg | ORAL_CAPSULE | Freq: Three times a day (TID) | ORAL | 0 refills | Status: DC | PRN
Start: 2018-10-28 — End: 2018-12-12

## 2018-10-28 MED ORDER — AZITHROMYCIN 500 MG TABLET
500.0000 mg | ORAL_TABLET | Freq: Every day | ORAL | 0 refills | Status: AC
Start: 2018-10-28 — End: 2018-10-31

## 2018-10-28 NOTE — Nurses Notes (Signed)
Assumed care of patient at 730. Patient is alert x4. Tele 36. Rhythm: NSR in the 80's. Patient has no concerns at this time. Patient rated pain 0/10. Assessment completed at this time. Plan of care reviewed. Call bell within reach. Will continue to monitor.

## 2018-10-28 NOTE — Nurses Notes (Signed)
Vital signs stable. IV access removed, no complications. AVS reviewed with pt/family member-verbalize understanding of home care/meds, follow-up appointments and reportable signs and symptoms. Pt transported to personal vehicle via wheelchair and discharged to home with all personal belongings.

## 2018-10-28 NOTE — Discharge Summary (Signed)
[facility]  [place], [place] [address]    DISCHARGE SUMMARY  Melinda Igo, FNP-C      PATIENT NAME:  [name], [name]  MRN:  [id number]  DOB:  1955-07-16    ADMISSION DATE:  10/26/2018  DISCHARGE DATE:  10/28/2018    ATTENDING PHYSICIAN: No att. providers found  PRIMARY CARE PHYSICIAN: Melinda Robinson, DO     ADMISSION DIAGNOSIS: CAP (community acquired pneumonia)  DISCHARGE DIAGNOSIS:   Active Hospital Problems    Diagnosis Date Noted   . Principle Problem: CAP (community acquired pneumonia) [J18.9] 10/26/2018   . Sepsis [A41.9] 10/27/2018      Resolved Hospital Problems   No resolved problems to display.     Active Non-Hospital Problems    Diagnosis Date Noted   . Acute exacerbation of chronic obstructive pulmonary disease (COPD) (CMS HCC) 03/27/2018   . COPD with acute exacerbation (CMS HCC) 05/23/2017   . Tobacco use disorder 05/22/2017   . Acute respiratory failure (CMS HCC) 05/21/2017   . Non-compliance 03/27/2017   . 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 Diabetes (CMS [place]) 11/18/2015   . Obesity (BMI 35.0-39.9 without comorbidity) 10/08/2012      DISCHARGE MEDICATIONS:     Current Discharge Medication List      START taking these medications.      Details   amoxicillin-pot clavulanate 875-125 mg Tablet  Commonly known as:  AUGMENTIN   1 Tab, Oral, 2 TIMES DAILY  Qty:  14 Tab  Refills:  0     azithromycin 500 mg Tablet  Commonly known as:  ZITHROMAX   500 mg, Oral, DAILY  Qty:  3 Tab  Refills:  0     Benzonatate 200 mg Capsule  Commonly known as:  TESSALON   200 mg, Oral, EVERY 8 HOURS PRN  Qty:  10 Cap  Refills:  0        CONTINUE these medications which have CHANGED during your visit.      Details   * ipratropium 0.02 % Solution  Commonly known as:  ATROVENT  What changed:  Another medication with the same name was removed. Continue taking this medication, and follow the directions you see here.   0.5 mg, Nebulization, EVERY 4 HOURS  Qty:  30 Vial  Refills:  2     *  Atrovent HFA 17 mcg/actuation HFA Aerosol Inhaler oral inhaler  Generic drug:  ipratropium bromide  What changed:  Another medication with the same name was removed. Continue taking this medication, and follow the directions you see here.   2 Puffs, Inhalation, 4 TIMES DAILY  Qty:  13 g  Refills:  4         * This list has 2 medication(s) that are the same as other medications prescribed for you. Read the directions carefully, and ask your doctor or other care provider to review them with you.            CONTINUE these medications - NO CHANGES were made during your visit.      Details   * albuterol 2.5 mg/0.5 mL Solution for Nebulization  Commonly known as:  PROVENTIL   2.5 mg, Nebulization, 4 TIMES DAILY  Qty:  50 Each  Refills:  0     * albuterol 2.5 mg/0.5 mL Solution for Nebulization  Commonly known as:  PROVENTIL   USE 1 VIAL IN NEBULIZER EVERY 4 HOURS  Qty:  30 Each  Refills:  2     amLODIPine 10 mg Tablet  Commonly known as:  NORVASC   TAKE 1 TABLET BY MOUTH ONCE DAILY  Qty:  30 Tab  Refills:  5     aspirin 81 mg Tablet, Chewable   81 mg, Oral, DAILY  Refills:  0     atorvastatin 40 mg Tablet  Commonly known as:  LIPITOR   40 mg, Oral, DAILY  Qty:  90 Tab  Refills:  4     fluticasone propion-salmeteroL 500-50 mcg/dose Disk with Device oral diskus inhaler  Commonly known as:  ADVAIR   INHALE 1 DOSE BY MOUTH TWICE DAILY  Qty:  1 Inhaler  Refills:  1     furosemide 20 mg Tablet  Commonly known as:  LASIX   Take 1/2 (one-half) tablet by mouth once daily  Qty:  45 Tab  Refills:  1     glimepiride 4 mg Tablet  Commonly known as:  AMARYL   TAKE 1 TABLET BY MOUTH ONCE DAILY IN THE MORNING  Qty:  30 Tab  Refills:  5     Januvia 100 mg Tablet  Generic drug:  SITagliptin   Take 1 tablet by mouth once daily  Qty:  90 Tab  Refills:  1     lisinopriL 5 mg Tablet  Commonly known as:  PRINIVIL   Take 1 tablet by mouth once daily  Qty:  90 Tab  Refills:  1     pioglitazone 15 mg Tablet  Commonly known as:  ACTOS   Take 1  tablet by mouth once daily  Qty:  90 Tab  Refills:  1         * This list has 2 medication(s) that are the same as other medications prescribed for you. Read the directions carefully, and ask your doctor or other care provider to review them with you.              DISCHARGE INSTRUCTIONS:      DISCHARGE INSTRUCTION - CARDIAC DIET     Diet: CARDIAC DIET      DISCHARGE INSTRUCTION - ACTIVITY     Activity: AS TOLERATED      DISCHARGE INSTRUCTION - DIABETIC DIET     Diet: DIABETIC DIET      ASPIRIN ALREADY ORDERED     REASON FOR HOSPITALIZATION:  This is a 63 y.o., female  who presented with a CC of shortness of breath    SIGNIFICANT PHYSICAL FINDINGS:   Short of breath but states she feels better     SIGNIFICANT LAB:   See Epic    SIGNIFICANT RADIOLOGY/DISGNOSTIC TESTS:   See Epic    CONSULTATIONS:   none    PROCEDURES PERFORMED:   none      Physical exam :   General: appears in fair health. No acute distress.   Eyes: Pupils equal and round, reactive to light and accomodation.   HENT: Head atraumatic and normocephalic   Neck: No JVD or thyromegaly or lymphadenopathy   Lungs: CTAB, non labored breathing, no rales or wheezing.    Cardiovascular: regular rate and rhythm, S1, S2 normal, no murmur   Abdomen: Soft, non-tender, bowel sounds normal   Extremities: extremities normal, atraumatic, no cyanosis or edema   Skin: Skin warm and dry   Neurologic: Grossly normal   Psychiatric: Normal affect, behavior     Please see the H&P by Dr. Delora [name] for admission details    Excerpt from admission HPI:  HPI: [name] is a 63 y.o., White female with a history of COPD, DM who presents with shortness of breath.  The patient indicates that about 2 days ago she started to developed increasing shortness of breath.  This was associated with a cough that is productive of yellow phlegm.  She has also had some subjective fevers and chills but she denies having any rhinorrhea, sore throat, myalgias, headache, anosmia or dysgeusia.  She  indicates that her grandson visited her last week and he had a cold at that time.  She denies having any abdominal pain, nausea, vomiting, diarrhea, orthopnea, PND, peripheral edema.  She does have some left-sided chest pain that is worse with a deep inspiration as well.  She came to the ER for further evaluation where she was noted to be tachycardic, tachypneic and febrile to 102.  A CTA of the chest was negative for pulmonary embolism but did show a left upper lobe pneumonia and a respiratory virus biofire was negative for pathogens, including negative for COVID-19.  She was given IV Rocephin and Zithromax then admitted for further care.        COURSE IN HOSPITAL: As above.    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  Left adrenal mass  DM II, uncontrolled  HTN  HLD  Chronic hypoxic respiratory failure on home oxygen  Obesity (per medical criteria guidelines with BMI of 35.14)    New prescriptions:       Pick up these medications from [place], [place] - [address]   1 amoxicillin-pot clavulanate   2 azithromycin   3 Benzonatate       Follow-up appointments:      Follow up with Melinda Robinson, DO in 1 week(s)   Please call the office first thing in the morning, 10/29/2018, to arrange a hospital follow-up for this week  [facility]   [address]   [place] [address]   [phone]          CONDITION ON DISCHARGE: Alert, Oriented and VS Stable    DISCHARGE DISPOSITION:  Home discharge     DISCHARGE TIME SPENT:  40 minutes    cc: Primary Care Physician:  Melinda Robinson, DO  [place]  [place] [address]     [id number] Physician:  No referring provider defined for this encounter.       Melinda Igo, FNP-C

## 2018-10-28 NOTE — Care Plan (Signed)
Problem: Adult Inpatient Plan of Care  Goal: Plan of Care Review  Outcome: Ongoing (see interventions/notes)  Goal: Patient-Specific Goal (Individualized)  Outcome: Ongoing (see interventions/notes)  Goal: Absence of Hospital-Acquired Illness or Injury  Outcome: Ongoing (see interventions/notes)  Goal: Optimal Comfort and Wellbeing  Outcome: Ongoing (see interventions/notes)  Goal: Rounds/Family Conference  Outcome: Ongoing (see interventions/notes)     Problem: Fluid Imbalance (Pneumonia)  Goal: Fluid Balance  Outcome: Ongoing (see interventions/notes)     Problem: Infection (Pneumonia)  Goal: Resolution of Infection Signs and Symptoms  Outcome: Ongoing (see interventions/notes)     Problem: Respiratory Compromise (Pneumonia)  Goal: Effective Oxygenation and Ventilation  Outcome: Ongoing (see interventions/notes)     Problem: Fall Injury Risk  Goal: Absence of Fall and Fall-Related Injury  Outcome: Ongoing (see interventions/notes)     Problem: Fall Injury Risk  Goal: Absence of Fall and Fall-Related Injury  Outcome: Ongoing (see interventions/notes)     Problem: Health Knowledge, Opportunity to Enhance (Adult,Obstetrics,Pediatric)  Goal: Knowledgeable about Health Subject/Topic  Description: Patient will demonstrate the desired outcomes by discharge/transition of care.  Outcome: Ongoing (see interventions/notes)

## 2018-10-29 ENCOUNTER — Telehealth (HOSPITAL_BASED_OUTPATIENT_CLINIC_OR_DEPARTMENT_OTHER): Payer: Self-pay

## 2018-10-29 LAB — ECG 12-LEAD
Atrial Rate: 120 {beats}/min
Calculated P Axis: 26 degrees
Calculated R Axis: 70 degrees
Calculated T Axis: -172 degrees
PR Interval: 120 ms
QRS Duration: 100 ms
QT Interval: 362 ms
QTC Calculation: 511 ms
Ventricular rate: 120 {beats}/min

## 2018-10-29 NOTE — Care Management Notes (Signed)
Verifying discharge.

## 2018-10-29 NOTE — Telephone Encounter (Signed)
Transition of Care Contact  Discharge Date: 10/28/2018  Transition Facility Type--Hospital (Inpatient or Observation)  [facility]  Interactive Contact(s): Completed or attempted contact indicated by Date/Time  Completed Contact--10/29/2018  2:57 PM  First Attempt  Second Attempt  Third Attempt  Contact Method(s)-- Patient/Caregiver Telephone  Clinical Staff Name/Role who contacted--[facility] Carlton Adam, RN  Transition Assessment  Discharge Summary obtained?--Yes  How are you recovering?--Improving  Discharge Meds obtained?--Yes  Medications reconcilled with Discharge Documentation?--Yes  Medication understanding --knows purpose of medication  Medication Concerns?--No  Have everything needed for recovery?--Yes  Care Coordination:   Patient has transition follow-up appointment date and time?--Yes  Patient/caregiver plans to attend transition visit?--Yes  Primary Follow-up Barrier  Interventions provided --follow-up appointment date/time reinforced  Home Health or DME ordered at discharge?  Clinician/Team notified?  Primary reason clinician notified?  Transition Note:   No data   Further information may be documented in relevant telephone or outreach encounter.    TCM outreach completed. She is home and doing "much better today than yesterday." She was able to obtain new medications and started them today. She had no questions or other concerns. She called today and scheduled a follow-up with Dr. Sabra Heck on 11/05/2018.

## 2018-10-31 ENCOUNTER — Telehealth (HOSPITAL_BASED_OUTPATIENT_CLINIC_OR_DEPARTMENT_OTHER): Payer: Self-pay | Admitting: Family Medicine

## 2018-10-31 LAB — ADULT ROUTINE BLOOD CULTURE, SET OF 2 BOTTLES (BACTERIA AND YEAST)
BLOOD CULTURE, ROUTINE: NO GROWTH
BLOOD CULTURE, ROUTINE: NO GROWTH

## 2018-10-31 NOTE — Telephone Encounter (Signed)
Amber from [employer] called asking you if could send over progress reports for the nebulizer you ordered could you fax it over to [phone].     Floria Raveling  10/31/2018, 11:58

## 2018-10-31 NOTE — Telephone Encounter (Signed)
Faxed last office note to [place]. [phone].    Maye Hides, LPN  [phone], [phone]

## 2018-11-05 ENCOUNTER — Ambulatory Visit (INDEPENDENT_AMBULATORY_CARE_PROVIDER_SITE_OTHER): Payer: MEDICAID | Admitting: Family Medicine

## 2018-11-05 ENCOUNTER — Other Ambulatory Visit: Payer: Self-pay

## 2018-11-05 VITALS — BP 152/70 | HR 88 | Temp 99.0°F | Resp 24 | Ht 63.0 in | Wt 194.8 lb

## 2018-11-05 DIAGNOSIS — E119 Type 2 diabetes mellitus without complications: Secondary | ICD-10-CM

## 2018-11-05 DIAGNOSIS — Z794 Long term (current) use of insulin: Secondary | ICD-10-CM

## 2018-11-05 DIAGNOSIS — Z09 Encounter for follow-up examination after completed treatment for conditions other than malignant neoplasm: Secondary | ICD-10-CM

## 2018-11-05 DIAGNOSIS — I10 Essential (primary) hypertension: Secondary | ICD-10-CM

## 2018-11-05 DIAGNOSIS — I152 Hypertension secondary to endocrine disorders: Secondary | ICD-10-CM

## 2018-11-05 MED ORDER — LANCETS 26 GAUGE
11 refills | Status: AC
Start: 2018-11-05 — End: 2018-11-06

## 2018-11-05 MED ORDER — NEEDLE (DISP)
11 refills | Status: AC
Start: 2018-11-05 — End: 2018-11-06

## 2018-11-05 MED ORDER — BLOOD-GLUCOSE METER
0 refills | Status: AC
Start: 2018-11-05 — End: 2018-11-06

## 2018-11-05 MED ORDER — BLOOD GLUCOSE CONTROL, NORMAL SOLUTION
11 refills | Status: AC
Start: 2018-11-05 — End: 2018-11-06

## 2018-11-05 MED ORDER — LISINOPRIL 5 MG TABLET
ORAL_TABLET | ORAL | 3 refills | Status: DC
Start: 2018-11-05 — End: 2019-11-25

## 2018-11-05 MED ORDER — PIOGLITAZONE 15 MG TABLET
ORAL_TABLET | ORAL | 3 refills | Status: DC
Start: 2018-11-05 — End: 2019-03-08

## 2018-11-05 MED ORDER — BLOOD SUGAR DIAGNOSTIC STRIPS
ORAL_STRIP | 11 refills | Status: AC
Start: 2018-11-05 — End: 2018-11-06

## 2018-11-05 MED ORDER — GLIMEPIRIDE 4 MG TABLET
ORAL_TABLET | ORAL | 3 refills | Status: DC
Start: 2018-11-05 — End: 2018-12-07

## 2018-11-05 MED ORDER — FLUTICASONE 500 MCG-SALMETEROL 50 MCG/DOSE BLISTR POWDR FOR INHALATION
DISK | RESPIRATORY_TRACT | 3 refills | Status: DC
Start: 2018-11-05 — End: 2019-03-21

## 2018-11-05 MED ORDER — AMLODIPINE 10 MG TABLET
ORAL_TABLET | ORAL | 3 refills | Status: DC
Start: 2018-11-05 — End: 2019-06-20

## 2018-11-05 MED ORDER — LANTUS SOLOSTAR U-100 INSULIN 100 UNIT/ML (3 ML) SUBCUTANEOUS PEN
17.00 [IU] | PEN_INJECTOR | Freq: Every evening | SUBCUTANEOUS | 1 refills | Status: DC
Start: 2018-11-05 — End: 2018-12-07

## 2018-11-05 NOTE — Nursing Note (Signed)
Chief Complaint:   Chief Complaint            ED Follow-up         Functional Health Screen  Functional Health Screening:    Patient is under 18:  No  Have you had a recent unexplained weight loss or gain?:  No  Because we are aware of abuse and domestic violence today, we ask all patients: Are you being hurt, hit, or frightened by anyone at your home or in your life?:  No  Do you have any basic needs within your home that are not being met? (such as Food, Shelter, Games developer, Transportation):  No  Patient is under 18 and therefore has no Advance Directives:  No  Patient has:  No Advance  Patient has Advance Directive:  No  Patient offered:  Refused Packet  Screening unable to be completed:  No       BP (!) 152/70   Pulse 88   Temp 37.2 C (99 F) (Temporal)   Resp (!) 24   Ht 1.6 m ('5\' 3"' )   Wt 88.4 kg (194 lb 12.8 oz)   LMP  (LMP Unknown)   SpO2 (!) 88% Comment: RA  BMI 34.51 kg/m       Social History     Tobacco Use   Smoking Status Current Every Day Smoker   . Packs/day: 1.00   . Years: 20.00   . Pack years: 20.00   . Types: Cigarettes   Smokeless Tobacco Never Used     Patient Health Rating           Depression Screening  PHQ Questionnaire     Allergies:  Allergies   Allergen Reactions   . Codeine Nausea/ Vomiting     Sick to stomach   . Hydrocodone Nausea/ Vomiting   . Metformin Diarrhea   . Oxycodone Nausea/ Vomiting     Medication History  Reviewed for OTC medication and any new medications, provider will review medication history  Results through Enter/Edit  No results found for this or any previous visit (from the past 24 hour(s)).  POCT Results  Care Team  Patient Care Team:  Hubbard Robinson, DO as PCP - General ([place])  Immunizations - last 24 hours     None        Maye Hides, LPN  [id number], 02:40

## 2018-11-05 NOTE — Progress Notes (Signed)
[facility], [facility]  [facility]  [place] [address]    Transitional Care Management Visit    Name: [name]  MRN: [id number]    Date: 11/05/2018  Age: 63 y.o.      Transition of Care Contact  Discharge Date: 10/28/2018  Transition Facility Type--Hospital (Inpatient or Observation)  [facility]  Interactive Contact(s): Completed or attempted contact indicated by Date/Time  Completed Contact--10/29/2018  2:57 PM  First Attempt  Second Attempt  Third Attempt  Contact Method(s)-- Patient/Caregiver Telephone  Clinical Staff Name/Role who contacted--Heather Carlton Adam, RN  Transition Assessment  Discharge Summary obtained?--Yes  How are you recovering?--Improving  Discharge Meds obtained?--Yes  Medications reconcilled with Discharge Documentation?--Yes  Medication understanding --knows purpose of medication  Medication Concerns?--No  Have everything needed for recovery?--Yes  Care Coordination:   Patient has transition follow-up appointment date and time?--Yes  Patient/caregiver plans to attend transition visit?--Yes  Primary Follow-up Barrier  Interventions provided --follow-up appointment date/time reinforced  Home Health or DME ordered at discharge?  Clinician/Team notified?  Primary reason clinician notified?  Transition Note:   No data   Further information may be documented in relevant telephone or outreach encounter.          Chief Complaint: ED Follow-up and Hospital Discharge Transition    History of Present Illness:  [name] is a 63 y.o. female is seen for transitional care management.  She was admitted to the hospital with CAP.  She was having some CP and SOB with fever prior to visit.  She is very non compliant with medications and diabetes management.  Part of this is financial but some is she does what she wants to do.  Started on IV antibiotics and was discharged.  She finished her home antibiotic therapy.  Reports still having some cough.  No  fevers.  Feels much improved.  Of note A1C in the hospital was 12.  She reports she doesn't remember to take her medications daily and drinks three sodas per day along with whatever she wants to eat.     Past Medical History:  She has a past medical history of COPD (chronic obstructive pulmonary disease) (CMS HCC), Degenerative joint disease involving multiple joints, Diabetes mellitus (CMS [place]), HTN (hypertension), Smoker, Supplemental oxygen dependent, and Wears glasses. She also has no past medical history of Cancer (CMS [place]), Congestive heart failure (CMS HCC), Convulsions (CMS HCC), CVA (cerebrovascular accident) (CMS [facility]), Thyroid disease, or Wears dentures.    Past Surgical History:  She has a past surgical history that includes hx carpal tunnel release.    Problem List:  She has Obesity (BMI 35.0-39.9 without comorbidity); Diabetes (CMS [place]); Hypertension due to endocrine disorder; Non-compliance; Acute respiratory failure (CMS HCC); Tobacco use disorder; COPD with acute exacerbation (CMS HCC); Acute exacerbation of chronic obstructive pulmonary disease (COPD) (CMS HCC); CAP (community acquired pneumonia); and Sepsis on their problem list.    Medications:  .  albuterol  .  albuterol  .  amLODIPine  .  aspirin  .  atorvastatin  .  benzonatate  .  fluticasone propion-salmeteroL  .  furosemide  .  glimepiride  .  ipratropium  .  Atrovent HFA  .  Januvia  .  lisinopriL  .  pioglitazone     Review of Systems:  Review of Systems   Pertinent items are noted in HPI. All pertinent positives and negatives noted in the HPI  Constitutional: No recent illness, no fever,  no chills, no night sweats, no weight loss.  Neuro: No dizziness, No lightheadedness, No syncope, no hx of seizures  Eyes: No blurring of vision or diplopia.  ENT:No sore throat. No dysphagia, No odynophagia, No hearing loss, No tinnitus, No rhinorrhea, No sinus pains.  Respiratory: + Cough and mild dyspnea.   Cardiovascular: no chest pain  No  palpitation, No exertional dyspnea, No claudications, No pedal edema.  Muskuloskeletal: No myalgias, No arthralgias.  GI.no abdominal pain,nausea,vomiting,diarrhea.  GU. no dysuria,frequency of urination.  Skin: no rashes.    Exam:   Vitals:    11/05/18 0823   BP: (!) 152/70   Pulse: 88   Resp: (!) 24   Temp: 37.2 C (99 F)   TempSrc: Temporal   SpO2: (!) 88%   Weight: 88.4 kg (194 lb 12.8 oz)   Height: 1.6 m (5\' 3" )   BMI: 34.58       Data Reviewed:   Labs from hospital reviewed  A1C was 12    Assessment/Plan:    ICD-10-CM    1. Hypertension due to endocrine disorder  I15.2 amLODIPine (NORVASC) 10 mg Oral Tablet   2. Diabetes (CMS HCC)  E11.9 glimepiride (AMARYL) 4 mg Oral Tablet   3. Hypertension, unspecified type  I10 lisinopriL (PRINIVIL) 5 mg Oral Tablet   4. Type 2 diabetes mellitus without complication, with long-term current use of insulin (CMS HCC)  E11.9 pioglitazone (ACTOS) 15 mg Oral Tablet    Z79.4      Orders Placed This Encounter   . amLODIPine (NORVASC) 10 mg Oral Tablet   . fluticasone propion-salmeteroL (ADVAIR) 500-50 mcg/dose Inhalation Disk with Device oral diskus inhaler   . glimepiride (AMARYL) 4 mg Oral Tablet   . lisinopriL (PRINIVIL) 5 mg Oral Tablet   . pioglitazone (ACTOS) 15 mg Oral Tablet     1.  CAP - Improving, O2 sat today was 88%, patient states this is due to the mask though she does not appear in any distress at this time.  Will follow up with chest imaging in 2-3 months for recheck of pneumonia or nodule.  Call if worsening, stop smoking.    2.  Diabetes - Uncontrolled at A1C of 12.  She reports she is still going to eat and drink what she wants.  Discussed starting insulin today with which she is reluctant to do.  Start Lantus at 17 units daily.  Follow up in 4 months for recheck.       Transitional Care services provided:  Discharge documentation was reviewed and Discharge documentation used to reconcile outpatient medication list  No follow-ups on file.     Hubbard Robinson,  DO  [facility]  [place]  [place] [address]  [phone]

## 2018-11-30 ENCOUNTER — Other Ambulatory Visit (HOSPITAL_BASED_OUTPATIENT_CLINIC_OR_DEPARTMENT_OTHER): Payer: Self-pay | Admitting: Family Medicine

## 2018-11-30 NOTE — Telephone Encounter (Signed)
Pt called stating that she is out of her Proventil 2.5mg  nebulization. Could you please refill it.     Floria Raveling  11/30/2018, 10:16

## 2018-12-01 MED ORDER — ALBUTEROL SULFATE CONCENTRATE 2.5 MG/0.5 ML SOLUTION FOR NEBULIZATION
2.50 mg | INHALATION_SOLUTION | Freq: Four times a day (QID) | RESPIRATORY_TRACT | 0 refills | Status: DC
Start: 2018-12-01 — End: 2018-12-24

## 2018-12-04 ENCOUNTER — Other Ambulatory Visit: Payer: Self-pay

## 2018-12-04 ENCOUNTER — Emergency Department (HOSPITAL_COMMUNITY): Payer: Medicaid Other

## 2018-12-04 ENCOUNTER — Encounter (HOSPITAL_COMMUNITY): Payer: Self-pay | Admitting: Internal Medicine

## 2018-12-04 ENCOUNTER — Inpatient Hospital Stay
Admission: EM | Admit: 2018-12-04 | Discharge: 2018-12-07 | DRG: 871 | Disposition: A | Discharge status: Home / Self Care | Payer: Medicaid Other | Attending: Internal Medicine | Admitting: Internal Medicine

## 2018-12-04 DIAGNOSIS — F17210 Nicotine dependence, cigarettes, uncomplicated: Secondary | ICD-10-CM | POA: Diagnosis present

## 2018-12-04 DIAGNOSIS — J440 Chronic obstructive pulmonary disease with acute lower respiratory infection: Secondary | ICD-10-CM | POA: Diagnosis present

## 2018-12-04 DIAGNOSIS — I152 Hypertension secondary to endocrine disorders: Secondary | ICD-10-CM | POA: Diagnosis present

## 2018-12-04 DIAGNOSIS — Z6835 Body mass index (BMI) 35.0-35.9, adult: Secondary | ICD-10-CM

## 2018-12-04 DIAGNOSIS — M159 Polyosteoarthritis, unspecified: Secondary | ICD-10-CM | POA: Diagnosis present

## 2018-12-04 DIAGNOSIS — Z9981 Dependence on supplemental oxygen: Secondary | ICD-10-CM

## 2018-12-04 DIAGNOSIS — R0600 Dyspnea, unspecified: Secondary | ICD-10-CM

## 2018-12-04 DIAGNOSIS — F17200 Nicotine dependence, unspecified, uncomplicated: Secondary | ICD-10-CM | POA: Diagnosis present

## 2018-12-04 DIAGNOSIS — J189 Pneumonia, unspecified organism: Secondary | ICD-10-CM | POA: Diagnosis present

## 2018-12-04 DIAGNOSIS — J9601 Acute respiratory failure with hypoxia: Secondary | ICD-10-CM | POA: Diagnosis present

## 2018-12-04 DIAGNOSIS — Z7982 Long term (current) use of aspirin: Secondary | ICD-10-CM

## 2018-12-04 DIAGNOSIS — I10 Essential (primary) hypertension: Secondary | ICD-10-CM | POA: Diagnosis present

## 2018-12-04 DIAGNOSIS — E8770 Fluid overload, unspecified: Secondary | ICD-10-CM | POA: Diagnosis present

## 2018-12-04 DIAGNOSIS — J9621 Acute and chronic respiratory failure with hypoxia: Secondary | ICD-10-CM | POA: Diagnosis present

## 2018-12-04 DIAGNOSIS — Z794 Long term (current) use of insulin: Secondary | ICD-10-CM

## 2018-12-04 DIAGNOSIS — Z20828 Contact with and (suspected) exposure to other viral communicable diseases: Secondary | ICD-10-CM | POA: Diagnosis present

## 2018-12-04 DIAGNOSIS — J441 Chronic obstructive pulmonary disease with (acute) exacerbation: Secondary | ICD-10-CM | POA: Diagnosis present

## 2018-12-04 DIAGNOSIS — Z79899 Other long term (current) drug therapy: Secondary | ICD-10-CM

## 2018-12-04 DIAGNOSIS — Z7951 Long term (current) use of inhaled steroids: Secondary | ICD-10-CM

## 2018-12-04 DIAGNOSIS — E669 Obesity, unspecified: Secondary | ICD-10-CM | POA: Diagnosis present

## 2018-12-04 DIAGNOSIS — E119 Type 2 diabetes mellitus without complications: Secondary | ICD-10-CM | POA: Diagnosis present

## 2018-12-04 DIAGNOSIS — J9622 Acute and chronic respiratory failure with hypercapnia: Secondary | ICD-10-CM | POA: Diagnosis present

## 2018-12-04 DIAGNOSIS — A419 Sepsis, unspecified organism: Principal | ICD-10-CM | POA: Diagnosis present

## 2018-12-04 LAB — MANUAL DIFFERENTIAL
BAND %: 1 % (ref 0–4)
EOSINOPHIL %: 1 % (ref 0–5)
EOSINOPHIL ABSOLUTE: 0.18 10*3/uL (ref 0.00–0.50)
LYMPHOCYTE %: 29 % (ref 15–43)
LYMPHOCYTE ABSOLUTE: 5.16 10*3/uL — ABNORMAL HIGH (ref 1.00–4.80)
MONOCYTE %: 8 % (ref 5–12)
MONOCYTE ABSOLUTE: 1.42 10*3/uL — ABNORMAL HIGH (ref 0.20–0.90)
NEUTROPHIL %: 61 % (ref 43–76)
NEUTROPHIL ABSOLUTE: 11.04 10*3/uL — ABNORMAL HIGH (ref 1.50–6.50)
PLATELET ESTIMATE: ADEQUATE
RBC MORPHOLOGY COMMENT: NORMAL
WBC: 17.8 10*3/uL

## 2018-12-04 LAB — URINALYSIS WITH MICROSCOPIC REFLEX IF INDICATED BMC/JMC ONLY
BILIRUBIN: NEGATIVE mg/dL
BLOOD: NEGATIVE mg/dL
GLUCOSE: >=1000 mg/dL — AB
LEUKOCYTES: NEGATIVE WBCs/uL
NITRITE: NEGATIVE
PH: 5.5 (ref <8.0)
PROTEIN: NEGATIVE mg/dL
SPECIFIC GRAVITY: 1.02 (ref <1.022)
UROBILINOGEN: 0.2 mg/dL (ref <=2.0)

## 2018-12-04 LAB — CBC WITH DIFF
HCT: 44.3 % (ref 36.0–45.0)
HGB: 14.3 g/dL (ref 12.0–15.5)
MCH: 29.9 pg (ref 27.5–33.2)
MCHC: 32.3 g/dL (ref 32.0–36.0)
MCV: 92.5 fL (ref 82.0–97.0)
MPV: 9.1 fL (ref 7.4–10.5)
PLATELETS: 346 10*3/uL (ref 150–450)
RBC: 4.78 10*6/uL (ref 4.00–5.10)
RDW: 14 % (ref 11.0–16.0)
WBC: 17.8 10*3/uL — ABNORMAL HIGH (ref 4.0–11.0)

## 2018-12-04 LAB — COMPREHENSIVE METABOLIC PROFILE - BMC/JMC ONLY
ALBUMIN/GLOBULIN RATIO: 1.2 (ref 0.8–2.0)
ALBUMIN: 3.8 g/dL (ref 3.5–5.0)
ALKALINE PHOSPHATASE: 94 U/L (ref 38–126)
ALT (SGPT): 13 U/L — ABNORMAL LOW (ref 14–54)
ANION GAP: 13 mmol/L — ABNORMAL HIGH (ref 3–11)
AST (SGOT): 14 U/L — ABNORMAL LOW (ref 15–41)
BILIRUBIN TOTAL: 0.5 mg/dL (ref 0.3–1.2)
BUN/CREA RATIO: 8 (ref 6–22)
BUN: 4 mg/dL — ABNORMAL LOW (ref 6–20)
CALCIUM: 9.3 mg/dL (ref 8.8–10.2)
CHLORIDE: 96 mmol/L — ABNORMAL LOW (ref 101–111)
CO2 TOTAL: 26 mmol/L (ref 22–32)
CREATININE: 0.49 mg/dL (ref 0.44–1.00)
ESTIMATED GFR: >60 mL/min/{1.73_m2} (ref >60)
GLUCOSE: 396 mg/dL — ABNORMAL HIGH (ref 70–110)
POTASSIUM: 4.4 mmol/L (ref 3.4–5.1)
PROTEIN TOTAL: 6.9 g/dL (ref 6.4–8.3)
SODIUM: 135 mmol/L — ABNORMAL LOW (ref 136–145)

## 2018-12-04 LAB — ARTERIAL BLOOD GAS - MLS OP ONLY
BASE EXCESS (ARTERIAL): 4.6 mmol/L — ABNORMAL HIGH (ref ?–2.0)
BICARBONATE (ARTERIAL): 28.6 mmol/L — ABNORMAL HIGH (ref 21.0–28.0)
PCO2 (ARTERIAL): 72 mmHg (ref 35.0–45.0)
PH (ARTERIAL): 7.28 — ABNORMAL LOW (ref 7.35–7.45)
PO2 (ARTERIAL): 195 mmHg — ABNORMAL HIGH (ref 83.0–108.0)

## 2018-12-04 LAB — POC FINGERSTICK GLUCOSE - BMC/JMC (RESULTS)
GLUCOSE, POC: 399 mg/dL — ABNORMAL HIGH (ref 60–100)
GLUCOSE, POC: 388 mg/dL — ABNORMAL HIGH (ref 60–100)
GLUCOSE, POC: 352 mg/dL — ABNORMAL HIGH (ref 60–100)
GLUCOSE, POC: 375 mg/dL — ABNORMAL HIGH (ref 60–100)

## 2018-12-04 LAB — RESPIRATORY VIRUS PANEL BY BIOFIRE FILM ARRAY

## 2018-12-04 LAB — ARTERIAL BLOOD GAS
%FIO2 (ARTERIAL): 90 %
O2 SATURATION (ARTERIAL): 99.6 %
PAO2/FIO2 RATIO: 217

## 2018-12-04 LAB — B-TYPE NATRIURETIC PEPTIDE (BNP),PLASMA: BNP: 464 pg/mL — ABNORMAL HIGH (ref 0–100)

## 2018-12-04 LAB — RED TOP TUBE

## 2018-12-04 LAB — BLUE TOP TUBE

## 2018-12-04 LAB — TROPONIN-I: TROPONIN I: <0.03 ng/mL (ref <=0.06)

## 2018-12-04 LAB — LACTIC ACID LEVEL W/ REFLEX FOR LEVEL >2.0: LACTIC ACID: 2.4 mmol/L (ref 0.5–2.0)

## 2018-12-04 LAB — LACTIC ACID - FIRST REFLEX: LACTIC ACID: 1.9 mmol/L (ref 0.5–2.0)

## 2018-12-04 MED ORDER — SODIUM CHLORIDE 0.9 % (FLUSH) INJECTION SYRINGE
10.0000 mL | INJECTION | INTRAMUSCULAR | Status: DC | PRN
Start: 2018-12-04 — End: 2018-12-07

## 2018-12-04 MED ORDER — DOXYCYCLINE HYCLATE 100 MG INTRAVENOUS POWDER FOR SOLUTION
100.00 mg | Freq: Two times a day (BID) | INTRAVENOUS | Status: DC
Start: 2018-12-04 — End: 2018-12-06
  Administered 2018-12-04: 0 mg via INTRAVENOUS
  Administered 2018-12-04 – 2018-12-05 (×3): 100 mg via INTRAVENOUS
  Administered 2018-12-05 – 2018-12-06 (×4): 0 mg via INTRAVENOUS
  Administered 2018-12-06 (×2): 100 mg via INTRAVENOUS
  Filled 2018-12-04 (×7): qty 10

## 2018-12-04 MED ORDER — ATORVASTATIN 40 MG TABLET
40.0000 mg | ORAL_TABLET | Freq: Every day | ORAL | Status: DC
Start: 2018-12-04 — End: 2018-12-07
  Administered 2018-12-04 – 2018-12-07 (×4): 40 mg via ORAL
  Filled 2018-12-04 (×4): qty 1

## 2018-12-04 MED ORDER — SODIUM CHLORIDE 0.9 % IV BOLUS
30.00 mL/kg | INJECTION | Status: AC
Start: 2018-12-04 — End: 2018-12-04
  Administered 2018-12-04: 1572 mL via INTRAVENOUS
  Administered 2018-12-04: 0 mL/kg via INTRAVENOUS

## 2018-12-04 MED ORDER — ASPIRIN 81 MG CHEWABLE TABLET
81.0000 mg | CHEWABLE_TABLET | Freq: Every day | ORAL | Status: DC
Start: 2018-12-04 — End: 2018-12-07
  Administered 2018-12-04 – 2018-12-07 (×4): 81 mg via ORAL
  Filled 2018-12-04 (×4): qty 1

## 2018-12-04 MED ORDER — LINAGLIPTIN 5 MG TABLET
5.0000 mg | ORAL_TABLET | Freq: Every day | ORAL | Status: DC
Start: 2018-12-04 — End: 2018-12-07
  Administered 2018-12-04 – 2018-12-07 (×4): 5 mg via ORAL
  Filled 2018-12-04 (×6): qty 1

## 2018-12-04 MED ORDER — LINEZOLID 600 MG/300 ML IV - EAST
600.00 mg | INTRAVENOUS | Status: AC
Start: 2018-12-04 — End: 2018-12-04
  Administered 2018-12-04: 11:00:00 600 mg via INTRAVENOUS
  Administered 2018-12-04: 12:00:00 0 mg via INTRAVENOUS
  Filled 2018-12-04: qty 300

## 2018-12-04 MED ORDER — SODIUM CHLORIDE 0.9% FLUSH BAG - 250 ML
INTRAVENOUS | Status: DC | PRN
Start: 2018-12-04 — End: 2018-12-07

## 2018-12-04 MED ORDER — SODIUM CHLORIDE 0.9 % (FLUSH) INJECTION SYRINGE
10.0000 mL | INJECTION | Freq: Three times a day (TID) | INTRAMUSCULAR | Status: DC
Start: 2018-12-04 — End: 2018-12-07
  Administered 2018-12-04: 0 mL via INTRAVENOUS
  Administered 2018-12-04 – 2018-12-07 (×8): 10 mL via INTRAVENOUS

## 2018-12-04 MED ORDER — MAGNESIUM SULFATE 2 GRAM/50 ML (4 %) IN WATER INTRAVENOUS PIGGYBACK
2.0000 g | INJECTION | INTRAVENOUS | Status: AC
Start: 2018-12-04 — End: 2018-12-04
  Administered 2018-12-04: 0 g via INTRAVENOUS
  Administered 2018-12-04: 2 g via INTRAVENOUS
  Filled 2018-12-04: qty 50

## 2018-12-04 MED ORDER — AMLODIPINE 5 MG TABLET
10.0000 mg | ORAL_TABLET | Freq: Every day | ORAL | Status: DC
Start: 2018-12-04 — End: 2018-12-07
  Administered 2018-12-04 – 2018-12-07 (×4): 10 mg via ORAL
  Filled 2018-12-04 (×4): qty 2

## 2018-12-04 MED ORDER — INSULIN GLARGINE (U-100) 100 UNIT/ML SUBCUTANEOUS SOLUTION
17.00 [IU] | Freq: Every evening | SUBCUTANEOUS | Status: DC
Start: 2018-12-04 — End: 2018-12-05
  Administered 2018-12-04: 17 [IU] via SUBCUTANEOUS
  Filled 2018-12-04: qty 200

## 2018-12-04 MED ORDER — DEXTROSE 10 % IN WATER (D10W) BOLUS
125.00 mL | INJECTION | INTRAVENOUS | Status: DC | PRN
Start: 2018-12-04 — End: 2018-12-05

## 2018-12-04 MED ORDER — ALBUTEROL SULFATE CONCENTRATE 2.5 MG/0.5 ML SOLUTION FOR NEBULIZATION
5.00 mg | INHALATION_SOLUTION | Freq: Four times a day (QID) | RESPIRATORY_TRACT | Status: DC
Start: 2018-12-04 — End: 2018-12-07
  Administered 2018-12-04 – 2018-12-06 (×10): 5 mg via RESPIRATORY_TRACT
  Administered 2018-12-06: 0 mg via RESPIRATORY_TRACT
  Administered 2018-12-07: 5 mg via RESPIRATORY_TRACT
  Administered 2018-12-07: 12:00:00
  Filled 2018-12-04 (×15): qty 2

## 2018-12-04 MED ORDER — METHYLPREDNISOLONE SOD SUCC 125 MG SOLUTION FOR INJECTION WRAPPER
60.00 mg | Freq: Three times a day (TID) | INTRAVENOUS | Status: DC
Start: 2018-12-04 — End: 2018-12-05
  Administered 2018-12-04 – 2018-12-05 (×3): 60 mg via INTRAVENOUS
  Filled 2018-12-04 (×3): qty 2

## 2018-12-04 MED ORDER — LISINOPRIL 10 MG TABLET
5.0000 mg | ORAL_TABLET | Freq: Every day | ORAL | Status: DC
Start: 2018-12-04 — End: 2018-12-07
  Administered 2018-12-04 – 2018-12-07 (×4): 5 mg via ORAL
  Filled 2018-12-04 (×4): qty 1

## 2018-12-04 MED ORDER — NITROGLYCERIN 0.4 MG SUBLINGUAL TABLET
0.4000 mg | SUBLINGUAL_TABLET | SUBLINGUAL | Status: DC | PRN
Start: 2018-12-04 — End: 2018-12-07

## 2018-12-04 MED ORDER — CEFTRIAXONE 1 GRAM/50 ML IN DEXTROSE (ISO-OSMOT) INTRAVENOUS PIGGYBACK
1.00 g | INJECTION | INTRAVENOUS | Status: DC
Start: 2018-12-04 — End: 2018-12-07
  Administered 2018-12-04: 20:00:00 1 g via INTRAVENOUS
  Administered 2018-12-04: 0 g via INTRAVENOUS
  Administered 2018-12-05: 1 g via INTRAVENOUS
  Administered 2018-12-05: 0 g via INTRAVENOUS
  Administered 2018-12-06: 1 g via INTRAVENOUS
  Administered 2018-12-06: 0 g via INTRAVENOUS
  Filled 2018-12-04 (×4): qty 50

## 2018-12-04 MED ORDER — ENOXAPARIN 40 MG/0.4 ML SUB-Q SYRINGE - EAST
40.0000 mg | INJECTION | Freq: Every day | SUBCUTANEOUS | Status: DC
Start: 2018-12-04 — End: 2018-12-07
  Administered 2018-12-04 – 2018-12-06 (×3): 40 mg via SUBCUTANEOUS
  Administered 2018-12-07: 0 mg via SUBCUTANEOUS
  Filled 2018-12-04 (×4): qty 0.4

## 2018-12-04 MED ORDER — IPRATROPIUM BROMIDE 0.02 % SOLUTION FOR INHALATION
0.5000 mg | RESPIRATORY_TRACT | Status: AC
Start: 2018-12-04 — End: 2018-12-04
  Administered 2018-12-04: 0.5 mg via RESPIRATORY_TRACT
  Filled 2018-12-04: qty 1

## 2018-12-04 MED ORDER — IPRATROPIUM BROMIDE 0.02 % SOLUTION FOR INHALATION
0.50 mg | Freq: Four times a day (QID) | RESPIRATORY_TRACT | Status: DC
Start: 2018-12-04 — End: 2018-12-07
  Administered 2018-12-04 – 2018-12-06 (×9): 0.5 mg via RESPIRATORY_TRACT
  Administered 2018-12-06: 0 mg via RESPIRATORY_TRACT
  Administered 2018-12-06 – 2018-12-07 (×2): 0.5 mg via RESPIRATORY_TRACT
  Administered 2018-12-07: 12:00:00
  Filled 2018-12-04 (×15): qty 1

## 2018-12-04 MED ORDER — CEFTRIAXONE 1 GRAM/50 ML IN DEXTROSE (ISO-OSMOT) DUPLEX
1.0000 g | INJECTION | Freq: Two times a day (BID) | INTRAVENOUS | Status: DC
Start: 2018-12-04 — End: 2018-12-04
  Administered 2018-12-04: 0 g via INTRAVENOUS

## 2018-12-04 MED ORDER — BUDESONIDE-FORMOTEROL HFA 160 MCG-4.5 MCG/ACTUATION AEROSOL INHALER
1.00 | INHALATION_SPRAY | Freq: Two times a day (BID) | RESPIRATORY_TRACT | Status: DC
Start: 2018-12-04 — End: 2018-12-07
  Administered 2018-12-04: 1 via RESPIRATORY_TRACT
  Administered 2018-12-04: 12:00:00 via RESPIRATORY_TRACT
  Administered 2018-12-05 – 2018-12-07 (×5): 1 via RESPIRATORY_TRACT
  Filled 2018-12-04: qty 6

## 2018-12-04 MED ORDER — SODIUM CHLORIDE 0.9 % INTRAVENOUS SOLUTION
2.0000 g | INTRAVENOUS | Status: AC
  Administered 2018-12-04: 0 g via INTRAVENOUS
  Administered 2018-12-04: 2 g via INTRAVENOUS
  Filled 2018-12-04: qty 12.5

## 2018-12-04 MED ORDER — ACETAMINOPHEN 325 MG TABLET
650.0000 mg | ORAL_TABLET | Freq: Four times a day (QID) | ORAL | Status: DC | PRN
Start: 2018-12-04 — End: 2018-12-07

## 2018-12-04 MED ORDER — TERBUTALINE 1 MG/ML SUBCUTANEOUS SOLUTION
0.2500 mg | SUBCUTANEOUS | Status: AC
Start: 2018-12-04 — End: 2018-12-04
  Administered 2018-12-04: 08:00:00 0.25 mg via SUBCUTANEOUS
  Filled 2018-12-04: qty 1

## 2018-12-04 MED ORDER — METHYLPREDNISOLONE SOD SUCC 125 MG SOLUTION FOR INJECTION WRAPPER
125.0000 mg | INTRAVENOUS | Status: AC
Start: 2018-12-04 — End: 2018-12-04
  Administered 2018-12-04: 125 mg via INTRAVENOUS
  Filled 2018-12-04: qty 2

## 2018-12-04 MED ORDER — INSULIN GLARGINE (U-100) 100 UNIT/ML (3 ML) SUBCUTANEOUS PEN
17.00 [IU] | PEN_INJECTOR | Freq: Every evening | SUBCUTANEOUS | Status: DC
Start: 2018-12-04 — End: 2018-12-04

## 2018-12-04 MED ORDER — MORPHINE 4 MG/ML INTRAVENOUS SOLUTION
4.0000 mg | INTRAVENOUS | Status: DC | PRN
Start: 2018-12-04 — End: 2018-12-07

## 2018-12-04 MED ORDER — INSULIN REGULAR HUMAN 100 UNIT/ML INJECTION SSIP - CITY
1.00 [IU] | Freq: Four times a day (QID) | INTRAMUSCULAR | Status: DC
Start: 2018-12-04 — End: 2018-12-05
  Administered 2018-12-04: 0 [IU] via SUBCUTANEOUS
  Administered 2018-12-04: 5 [IU] via SUBCUTANEOUS
  Administered 2018-12-05: 0 [IU] via SUBCUTANEOUS
  Administered 2018-12-05 (×2): 4 [IU] via SUBCUTANEOUS
  Filled 2018-12-04 (×2): qty 3

## 2018-12-04 MED ORDER — ALBUTEROL SULFATE CONCENTRATE 2.5 MG/0.5 ML SOLUTION FOR NEBULIZATION
2.5000 mg | INHALATION_SOLUTION | RESPIRATORY_TRACT | Status: AC
Start: 2018-12-04 — End: 2018-12-04
  Administered 2018-12-04: 2.5 mg via RESPIRATORY_TRACT
  Filled 2018-12-04: qty 1

## 2018-12-04 MED ORDER — SODIUM CHLORIDE 0.9 % IV BOLUS
30.0000 mL/kg | INJECTION | Freq: Once | Status: AC
Start: 2018-12-04 — End: 2018-12-04
  Administered 2018-12-04: 13:00:00 1572 mL via INTRAVENOUS
  Administered 2018-12-04: 0 mL/kg via INTRAVENOUS

## 2018-12-04 NOTE — ED Nurses Note (Signed)
Report to Jess, RN

## 2018-12-04 NOTE — Consults (Signed)
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 Denied SI/HI. States she does not trust counselors but she did agree to "think about counseling". Pt smokes and has been trying to cut down. Pt agreed to brief interventions.

## 2018-12-04 NOTE — ED Nurses Note (Signed)
Assumed care of pt at this time. Report received from Lauren, RN.

## 2018-12-04 NOTE — ED Nurses Note (Addendum)
Spoke with hospitalist in regards to pt's condition at this time. Informed that pt has been able to tolerate being taken off the BiPAP and placed on 4 L via O2.  Spoke with in regards to potentially switching pt from ICU bed to 2nd floor PCU. Hospitalist will be in to see pt.

## 2018-12-04 NOTE — Care Plan (Signed)
Problem: Adult Inpatient Plan of Care  Goal: Plan of Care Review  Outcome: Ongoing (see interventions/notes)  Goal: Patient-Specific Goal (Individualized)  Outcome: Ongoing (see interventions/notes)  Goal: Absence of Hospital-Acquired Illness or Injury  Outcome: Ongoing (see interventions/notes)  Goal: Optimal Comfort and Wellbeing  Outcome: Ongoing (see interventions/notes)  Goal: Rounds/Family Conference  Outcome: Ongoing (see interventions/notes)     Problem: Gas Exchange Impaired  Goal: Optimal Gas Exchange  Outcome: Ongoing (see interventions/notes)     Problem: Pain Acute  Goal: Optimal Pain Control  Outcome: Ongoing (see interventions/notes)

## 2018-12-04 NOTE — Nurses Notes (Signed)
Report received from [place] in bed at this time.   Pt on 4 lpm via NC  ADA- [facility]  Tele 1   Independent in the room  Call light within reach, bed in low locked position, patient refusing bed alarm at this time.

## 2018-12-04 NOTE — Nurses Notes (Signed)
Pts blood sugar reading this evening 375 - [place] paged. Scheduled Lantus and max sliding scale given.     Pt walked to the bathroom and became short of breath- 97% on 4lpm - pt became anxious and started crying saying "It's just so scary waking up out of your sleep and not being able to breath". Pt was calmed and emotional support given. Pt inquired about when RT makes their next rounds. Spoke to RT.

## 2018-12-04 NOTE — ED Nurses Note (Signed)
EKG shown to Dr. Luvenia Heller.

## 2018-12-04 NOTE — ED Provider Notes (Signed)
Melinda Blue, DO  [facility]  Emergency Department Visit Note    Date:  12/04/2018  Primary care provider:  Hubbard Robinson, DO  Means of arrival:  ambulance  History obtained from: EMS personnel  History limited by: due to condition    Chief Complaint:  [name], date of birth 1955-09-10, is a 63 y.o. female who presents to the Emergency Department complaining of progressively worse shortness of breath over the last 24 hours.  Patient arrives via EMS as a code critical for shortness of breath.  EMS was contacted and arrived to the patient's home, finding her in the tripod position with hypoxia and oxygen saturations in the 70s.  Patient was unable to provide much history beyond the fact that she has been short of breath and coughing for the last couple of days.  Patient was placed on a non-rebreather face mask and administered albuterol.  The patient's blood pressure improved in her shortness of breath improved slightly, however she arrives in the emergency department still in considerable distress.  Patient is unable to provide significant history for herself because of her respiratory status.    Caveat: Full history of present illness is unobtainable from patient secondary to condition    REVIEW OF SYSTEMS     Caveat: Full review of systems is unobtainable from patient secondary to condition     PATIENT HISTORY     Past Medical History:  Past Medical History:   Diagnosis Date   . COPD (chronic obstructive pulmonary disease) (CMS HCC)    . Degenerative joint disease involving multiple joints    . Diabetes mellitus (CMS [place])    . HTN (hypertension)    . Smoker    . 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 Wears glasses      Past Surgical History:  Past Surgical History:   Procedure Laterality Date   . Hx carpal tunnel release       Social History:  Social History     Tobacco Use   . Smoking status: Current Every Day Smoker     Packs/day: 1.00      Years: 20.00     Pack years: 20.00     Types: Cigarettes   . Smokeless tobacco: Never Used   Substance Use Topics   . Alcohol use: No   . Drug use: No     Social History     Substance and Sexual Activity   Drug Use No     Medications:  Current Outpatient Medications   Medication Sig   . albuterol (PROVENTIL) 2.5 mg/0.5 mL Inhalation Solution for Nebulization USE 1 VIAL IN NEBULIZER EVERY 4 HOURS   . albuterol (PROVENTIL) 2.5 mg/0.5 mL Inhalation Solution for Nebulization 0.5 mL (2.5 mg total) by Nebulization route Four times a day   . amLODIPine (NORVASC) 10 mg Oral Tablet TAKE 1 TABLET BY MOUTH ONCE DAILY   . aspirin 81 mg Oral Tablet, Chewable Take 1 Tab (81 mg total) by mouth Once a day   . atorvastatin (LIPITOR) 40 mg Oral Tablet Take 1 Tab (40 mg total) by mouth Once a day   . benzonatate (TESSALON) 200 mg Oral Capsule Take 1 Cap (200 mg total) by mouth Every 8 hours as needed for Cough   . fluticasone propion-salmeteroL (ADVAIR) 500-50 mcg/dose Inhalation Disk with Device oral diskus inhaler INHALE 1  DOSE BY MOUTH TWICE DAILY   . furosemide (LASIX) 20 mg Oral Tablet Take 1/2 (one-half) tablet by mouth once daily   . glimepiride (AMARYL) 4 mg Oral Tablet TAKE 1 TABLET BY MOUTH ONCE DAILY IN THE MORNING   . insulin glargine (LANTUS SOLOSTAR U-100 INSULIN) 100 unit/mL Subcutaneous Insulin Pen 17 Units by Subcutaneous route Every night   . ipratropium (ATROVENT) 0.02 % Inhalation Solution 2.5 mL (0.5 mg total) by Nebulization route ONCE EVERY 4 HOURS   . ipratropium bromide (ATROVENT HFA) 17 mcg/actuation Inhalation HFA Aerosol Inhaler oral inhaler Take 2 INHALATION (2 Puffs total) by inhalation Four times a day   . JANUVIA 100 mg Oral Tablet Take 1 tablet by mouth once daily   . lisinopriL (PRINIVIL) 5 mg Oral Tablet Take 1 tablet by mouth once daily   . pioglitazone (ACTOS) 15 mg Oral Tablet Take 1 tablet by mouth once daily     Allergies:  Allergies   Allergen Reactions   . Codeine Nausea/ Vomiting     Sick to  stomach   . Hydrocodone Nausea/ Vomiting   . Metformin Diarrhea   . Oxycodone Nausea/ Vomiting     PHYSICAL EXAM     Vitals:   12/04/18 0809   BP: (!) 181/113   Pulse: (!) 143   Resp: (!) 26   SpO2: 98%     Pulse ox  98% on BiPap interpreted by me as: Abnormal    Constitutional: The patient is awake and alert, but incredibly dyspneic and this impedes much of the evaluation and HPI.  The patient is appropriately interactive with a nontoxic, but acutely ill appearance.  Patient is in moderate to severe respiratory distress.  Patient appears older than stated age.  Eyes: Pupils equal and round, reactive to light and accomodation. There is normal, painless extraocular muscle motion.  ENT: Atraumatic, normocephalic head, mucous membranes moist, TM's Clear, Nares unremarkable, Posterior Oropharynx is unremarkable, Trachea is midline without stridor.  Neck: No JVD or thyromegaly or lymphadenopathy, supple.  Lungs:  Diminished breath sounds throughout.  Increased inspiratory:expiratory ratio.  Moderate to severe respiratory distress-tripod in position and accessory muscle usage. Too dyspneic to speak.  Cardiovascular: Heart is S1-S2 tachycardic rate, but regular rhythm without murmur click gallop or rub.  Abdomen: Soft, non-tender, non-distended without evidence of rebound or guarding.  Extremities: No acute tenderness to palpation, deformity, or abnormality of ROM.  Skin: No cyanosis, jaundice, rash or lesion.  Neurologic: normal facial symmetry and speech, 5/5 upper and lower extremity strength, 2/4 patellar DTR's and intact light touch, pressure and pain sensation.   Vascular: Normal peripheral pulses with brisk capillary refills of less than 2 seconds.  Psychiatric: normal affect.  Normal insight.  No evidence of psychosis.    DIAGNOSTIC STUDIES     Labs:    Results for orders placed or performed during the hospital encounter of 12/04/18   RESPIRATORY VIRUS PANEL BY BIOFIRE FILM ARRAY    Specimen: Nasopharyngeal Swab    Result Value Ref Range    ADENOVIRUS ARRAY Not Detected Not Detected    CORONAVIRUS 229E Not Detected Not Detected    CORONAVIRUS HKU1 Not Detected Not Detected    CORONAVIRUS NL63 Not Detected Not Detected    CORONAVIRUS OC43 Not Detected Not Detected    SARS CORONAVIRUS 2 (SARS-CoV-2) Not Detected Not Detected    METAPNEUMOVIRUS ARRAY Not Detected Not Detected    RHINOVIRUS/ENTEROVIRUS ARRAY Not Detected Not Detected    INFLUENZA A Not Detected  Not Detected    INFLUENZA B ARRAY Not Detected Not Detected    PARAINFLUENZA 1 ARRAY Not Detected Not Detected    PARAINFLUENZA 2 ARRAY Not Detected Not Detected    PARAINFLUENZA 3 ARRAY Not Detected Not Detected    PARAINFLUENZA 4 ARRAY Not Detected Not Detected    RSV ARRAY Not Detected Not Detected    BORDETELLA PARAPERTUSSIS (IS 1001) Not Detected Not Detected    BORDETELLA PERTUSSIS ARRAY Not Detected Not Detected    CHLAMYDOPHILA PNEUMONIAE ARRAY Not Detected Not Detected    MYCOPLASMA PNEUMONIAE ARRAY Not Detected Not Detected   ARTERIAL BLOOD GAS   Result Value Ref Range    %FIO2 (ARTERIAL) 90 %    BICARBONATE (ARTERIAL) 28.6 (H) 21.0 - 28.0 mmol/L    PCO2 (ARTERIAL) 72.0 (HH) 35.0 - 45.0 mm/Hg    PH (ARTERIAL) 7.28 (L) 7.35 - 7.45    PO2 (ARTERIAL) 195.0 (H) 83.0 - 108.0 mm/Hg    BASE EXCESS (ARTERIAL) 4.6 (H) -2.0 - 3.0 mmol/L    PAO2/FIO2 RATIO 217     O2 SATURATION (ARTERIAL) 99.6 %   COMPREHENSIVE METABOLIC PROFILE - [facility] ONLY   Result Value Ref Range    SODIUM 135 (L) 136 - 145 mmol/L    POTASSIUM 4.4 3.4 - 5.1 mmol/L    CHLORIDE 96 (L) 101 - 111 mmol/L    CO2 TOTAL 26 22 - 32 mmol/L    ANION GAP 13 (H) 3 - 11 mmol/L    BUN 4 (L) 6 - 20 mg/dL    CREATININE 0.49 0.44 - 1.00 mg/dL    BUN/CREA RATIO 8 6 - 22    ESTIMATED GFR >60 >60 mL/min/1.2m^2    ALBUMIN 3.8 3.5 - 5.0 g/dL    CALCIUM 9.3 8.8 - 10.2 mg/dL    GLUCOSE 396 (H) 70 - 110 mg/dL    ALKALINE PHOSPHATASE 94 38 - 126 U/L    ALT (SGPT) 13 (L) 14 - 54 U/L    AST (SGOT) 14 (L) 15 - 41 U/L    BILIRUBIN  TOTAL 0.5 0.3 - 1.2 mg/dL    PROTEIN TOTAL 6.9 6.4 - 8.3 g/dL    ALBUMIN/GLOBULIN RATIO 1.2 0.8 - 2.0   TROPONIN-I   Result Value Ref Range    TROPONIN I <0.03 <=0.06 ng/mL   B-TYPE NATRIURETIC PEPTIDE   Result Value Ref Range    BNP 464 (H) 0 - 100 pg/mL   LACTIC ACID LEVEL W/ REFLEX FOR LEVEL >2.0   Result Value Ref Range    LACTIC ACID 2.4 (HH) 0.5 - 2.0 mmol/L   CBC WITH DIFF   Result Value Ref Range    WBC 17.8 (H) 4.0 - 11.0 x10^3/uL    RBC 4.78 4.00 - 5.10 x10^6/uL    HGB 14.3 12.0 - 15.5 g/dL    HCT 44.3 36.0 - 45.0 %    MCV 92.5 82.0 - 97.0 fL    MCH 29.9 27.5 - 33.2 pg    MCHC 32.3 32.0 - 36.0 g/dL    RDW 14.0 11.0 - 16.0 %    PLATELETS 346 150 - 450 x10^3/uL    MPV 9.1 7.4 - 10.5 fL   MANUAL DIFFERENTIAL   Result Value Ref Range    NEUTROPHIL % 61 43 - 76 %    LYMPHOCYTE % 29 15 - 43 %    MONOCYTE % 8 5 - 12 %    EOSINOPHIL % 1 0 - 5 %  BAND % 1 0 - 4 %    NEUTROPHIL ABSOLUTE 11.04 (H) 1.50 - 6.50 x10^3/uL    LYMPHOCYTE ABSOLUTE 5.16 (H) 1.00 - 4.80 x10^3/uL    MONOCYTE ABSOLUTE 1.42 (H) 0.20 - 0.90 x10^3/uL    EOSINOPHIL ABSOLUTE 0.18 0.00 - 0.50 x10^3/uL    PLATELET ESTIMATE Adequate     RBC MORPHOLOGY COMMENT Normal     ATYPICAL LYMPHOCYTES Present     WBC 17.8 x10^3/uL   POC FINGERSTICK GLUCOSE - [facility] (RESULTS)   Result Value Ref Range    GLUCOSE, POC 399 (H) 60 - 100 mg/dl     Labs reviewed and interpreted by me.    Radiology:   XR AP MOBILE CHEST   Final Result   Generalized increase in interstitial lung markings both lungs,   appears new compared to CT of 10/26/2018. This may represent interstitial   lung disease versus mild interstitial pulmonary edema.     Radiological imaging interpreted by radiologist and independently reviewed by me.    EKG:  12 lead EKG interpreted by me shows sinus tachycardia rhythm, rate of 132 bpm, possible left atrial enlargement, ST & T wave abnormality, consider inferior ischemia.     ED PROGRESS NOTE / MEDICAL DECISION MAKING     Old records reviewed by  me:  Emergency department nurse's notes reviewed    Orders Placed This Encounter   . ADULT ROUTINE BLOOD CULTURE, SET OF 2 BOTTLES (BACTERIA AND YEAST)   . ADULT ROUTINE BLOOD CULTURE, SET OF 2 BOTTLES (BACTERIA AND YEAST)   . RESPIRATORY VIRUS PANEL BY BIOFIRE FILM ARRAY   . XR AP MOBILE CHEST   . ARTERIAL BLOOD GAS   . CBC/DIFF   . COMPREHENSIVE METABOLIC PROFILE - [facility] ONLY   . URINALYSIS WITH MICROSCOPIC REFLEX IF INDICATED [facility] ONLY   . TROPONIN-I   . B-TYPE NATRIURETIC PEPTIDE   . LACTIC ACID LEVEL W/ REFLEX FOR LEVEL >2.0   . CBC WITH DIFF   . EXTRA TUBES - [facility] ONLY   . [name] TOP TUBE   . RED TOP TUBE   . LACTIC ACID TIMED   . BIPAP CONTINUOUS   . ECG 12-LEAD   . albuterol (PROVENTIL) 2.5mg / 0.5 mL nebulizer solution   . ipratropium (ATROVENT) 0.02% nebulizer solution   . methylPREDNISolone sod succ (SOLU-MEDROL) 125 mg/2 mL injection   . terbutaline (BRETHINE) 1 mg/mL injection   . magnesium sulfate 2 G in SW 50 mL premix IVPB   . ceFEPime (MAXIPIME) 2 g in iso-osmotic 100 mL premix IVPB   . linezolid (ZYVOX) 600 mg in iso-osmotic 300 mL premix IVPB   . NS bolus infusion 30 mL/kg (Ideal) = 1,572 mL     BLOOD CULTURE - [facility] ONLY - results pending at time of disposition  BLOOD CULTURE - [facility] - results pending at time of disposition     07:53: Code critical paged. ETA 5 mins.    0809:  Based on the patient's clinical presentation and physical examination findings, respiratory therapy was instructed to place the patient on BiPAP.  We will start with high FiO2 and wean accordingly.  An ABG was ordered in addition to obtaining laboratory testing.  I suspect that this is an acute exacerbation of COPD, but a respiratory viral array will also be performed to evaluate for the coronavirus.  Additionally, albuterol and Atrovent will be provided in addition to Solu-Medrol, terbutaline, and magnesium.  The patient seemed to understand the treatment plan.  We will follow our progress on the BiPAP and moved to  endotracheal intubation if necessary.    08:18: The patient is doing well on 80% O2 at this time. I have ordered the O2 level be dropped to 70% at this time.    08:33: Per nurse, the patient is doing well and has been dropped down to 80% O2.    09:12: Paging hospitalist.    09:42: I discussed the patient's case and above findings with Dr. Corwin Levins [facility]) who agrees that the patient is septic and treatment will include 1600cc IV fluid at ideal body weight. She will make arrangements for admission of the patient for further care.    MIPS     Not applicable    OPIATE PRESCRIPTION      Not applicable    CORE MEASURES     09:45: I have declared Sepsis at this time.   I have ordered a repeat Lactic Acid at 08:51 secondary to initial lactic acid >2.0.   I have verbally notified Jess, RN that I have declared sepsis.  I have ordered Cefepime and Linezolid Antibiotics to be administered after blood cultures have been drawn.  I have ordered 30 ml/kg based off of the ideal body weight of Ideal body weight: 52.4 kg (115 lb 8.3 oz) of NS.    CRITICAL CARE TIME     Total Critical Care Time for this patient is 35 minutes and it is exclusive of procedural time.    PRE-DISPOSITION VITALS      Pre-Disposition Vitals:  Filed Vitals:    12/04/18 0809 12/04/18 0827 12/04/18 0845   BP: (!) 181/113  131/62   Pulse: (!) 143  (!) 103   Resp: (!) 26     SpO2: 98% 100% 100%     CLINICAL IMPRESSION     Acute hypoxic and hypercapnic respiratory failure  Pneumonia  BiPAP management    DISPOSITION/PLAN     Admitted        Condition at Disposition: [place], DO scribed for No att. providers found on 12/04/2018 at 8:05 AM.     Documentation assistance provided for No att. providers found  by Melinda Blue, DO. Information recorded by the scribe was done at my direction and has been reviewed and validated by me No att. providers found.

## 2018-12-04 NOTE — ED Triage Notes (Signed)
Pt presents to the ED as a SOB code critical via EMS. Pt 83% on 15L NRB. Pt has hx of COPD.

## 2018-12-04 NOTE — ED Nurses Note (Signed)
Lab called for last set of blood cultures.

## 2018-12-04 NOTE — ED Nurses Note (Signed)
Hospitalist at pt bedside.

## 2018-12-04 NOTE — ACP (Advance Care Planning) (Signed)
Goals of Care  ([place]) Documentation    Purpose of Encounter: evaluationdiscussion    Parties in Attendance: Patient and Family (Patient's husband the bedside)    Patient's Decisional Capacity (Name of surrogate if no capacity)  possesses medical decision making capacity yes    Subjective/Patient Story:    Shortness of breath    Objective/Medical Story:  Acute on chronic hypoxemic respiratory failure  Acute exacerbation of COPD  Sepsis/pneumonia  Diabetes mellitus type 2  Hypertension    Goals of Care Determinations:  Discussed about code status with the patient discussed about chest compression intubation and shock and patient's face to do everything patient is a full code she wants to be resuscitated    Plan:  Patient Active Problem List   Diagnosis   . Obesity (BMI 35.0-39.9 without comorbidity)   . Diabetes (CMS [place])   . Hypertension due to endocrine disorder   . Non-compliance   . Acute respiratory failure (CMS HCC)   . Tobacco use disorder   . COPD with acute exacerbation (CMS HCC)   . Acute exacerbation of chronic obstructive pulmonary disease (COPD) (CMS HCC)   . CAP (community acquired pneumonia)   . Sepsis   . Acute hypoxemic respiratory failure (CMS HCC)         Code Status:  Code Status Information     Code Status    Full Code          Other documents completed (e.g. Advance directives, POLST, MOLST):  no    Time spent discussing advance care planning (ACP) - required if submitting a charge for an ACP discussion: 18 mins

## 2018-12-04 NOTE — H&P (Signed)
[facility]  [place], [place] [address]    General History and Physical    Melinda, [name]  Date of Admission:  12/04/2018  Date of Birth:  April 07, 1955    PCP: Hubbard Robinson, DO  Chief Complaint:  Shortness of breath      HPI: [name] is a 63 y.o., White female who presents with shortness of breath patient reports that about a month ago she was treated for pneumonia and has not felt well since then she has been having more shortness of breath both on exertion and at rest and has been having to use more of her inhalers and nebulizers.  She also reports of cough.  She denies any sick contacts.  She denies chest pain palpitations nausea vomiting abdominal pain constipation diarrhea bleeding or black stools.  EMS was contacted for the shortness of breath patient was found to be in try putting position with hypoxia on oxygen saturations around 70s.  Patient was placed on non-rebreather mask and administer albuterol while in the ER patient was placed on BiPAP at the time of my encounter patient was been off of BiPAP for at least 1 hour and has been doing okay with 4 L of oxygen and mental status also improved.      Active Hospital Problems   (*Primary Problem)    Diagnosis   . Acute hypoxemic respiratory failure (CMS HCC)       Past Medical History:   Diagnosis Date   . COPD (chronic obstructive pulmonary disease) (CMS HCC)    . Degenerative joint disease involving multiple joints    . Diabetes mellitus (CMS [place])    . HTN (hypertension)    . Smoker    . 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 Wears glasses      Past Surgical History:   Procedure Laterality Date   . HX CARPAL TUNNEL RELEASE         Medications Prior to Admission     Prescriptions    albuterol (PROVENTIL) 2.5 mg/0.5 mL Inhalation Solution for Nebulization    USE 1 VIAL IN NEBULIZER EVERY 4 HOURS    albuterol (PROVENTIL) 2.5 mg/0.5 mL Inhalation Solution for Nebulization    0.5 mL (2.5 mg total) by Nebulization  route Four times a day    amLODIPine (NORVASC) 10 mg Oral Tablet    TAKE 1 TABLET BY MOUTH ONCE DAILY    aspirin 81 mg Oral Tablet, Chewable    Take 1 Tab (81 mg total) by mouth Once a day    atorvastatin (LIPITOR) 40 mg Oral Tablet    Take 1 Tab (40 mg total) by mouth Once a day    benzonatate (TESSALON) 200 mg Oral Capsule    Take 1 Cap (200 mg total) by mouth Every 8 hours as needed for Cough    fluticasone propion-salmeteroL (ADVAIR) 500-50 mcg/dose Inhalation Disk with Device oral diskus inhaler    INHALE 1 DOSE BY MOUTH TWICE DAILY    furosemide (LASIX) 20 mg Oral Tablet    Take 1/2 (one-half) tablet by mouth once daily    glimepiride (AMARYL) 4 mg Oral Tablet    TAKE 1 TABLET BY MOUTH ONCE DAILY IN THE MORNING    insulin glargine (LANTUS SOLOSTAR U-100 INSULIN) 100 unit/mL Subcutaneous Insulin Pen    17 Units by Subcutaneous route Every night    ipratropium (ATROVENT) 0.02 % Inhalation Solution  2.5 mL (0.5 mg total) by Nebulization route ONCE EVERY 4 HOURS    ipratropium bromide (ATROVENT HFA) 17 mcg/actuation Inhalation HFA Aerosol Inhaler oral inhaler    Take 2 INHALATION (2 Puffs total) by inhalation Four times a day    JANUVIA 100 mg Oral Tablet    Take 1 tablet by mouth once daily    lisinopriL (PRINIVIL) 5 mg Oral Tablet    Take 1 tablet by mouth once daily    pioglitazone (ACTOS) 15 mg Oral Tablet    Take 1 tablet by mouth once daily          .  acetaminophen (TYLENOL) tablet, 650 mg, Oral, Q6H PRN    .  albuterol (PROVENTIL) 2.5mg / 0.5 mL nebulizer solution, 5 mg, Nebulization, 4x/day    And    .  ipratropium (ATROVENT) 0.02% nebulizer solution, 0.5 mg, Nebulization, 4x/day    .  amLODIPine (NORVASC) tablet, 10 mg, Oral, Daily    .  aspirin chewable tablet 81 mg, 81 mg, Oral, Daily    .  atorvastatin (LIPITOR) tablet, 40 mg, Oral, Daily    .  budesonide-formoterol (SYMBICORT) 160 mcg-4.5 mcg per inhalation oral inhaler - "Respiratory to administer", 1 Puff, Inhalation, 2x/day    .  cefTRIAXone  (ROCEPHIN) 1 g in iso-osmotic 50 mL premix IVPB, 1 g, Intravenous, Q24H    .  doxycycline hyclate 100 mg in NS 100 mL IVPB, 100 mg, Intravenous, Q12H    .  insulin glargine (LANTUS) 100 units/mL injection, 17 Units, Subcutaneous, NIGHTLY    .  linagliptin (TRADJENTA) tablet, 5 mg, Oral, Daily    .  lisinopril (PRINIVIL) tablet, 5 mg, Oral, Daily    .  methylPREDNISolone sod succ (SOLU-MEDROL) 125 mg/2 mL injection, 60 mg, Intravenous, Q8H    .  morphine 4 mg/mL injection, 4 mg, Intravenous, Q5 Min PRN    .  nitroGLYCERIN (NITROSTAT) sublingual tablet, 0.4 mg, Sublingual, Q5 Min PRN    .  NS 250 mL flush bag, , Intravenous, Q1H PRN    .  NS flush syringe, 10 mL, Intravenous, Q8HRS    .  NS flush syringe, 10 mL, Intravenous, Q1H PRN        Allergies   Allergen Reactions   . Codeine Nausea/ Vomiting     Sick to stomach   . Hydrocodone Nausea/ Vomiting   . Metformin Diarrhea   . Oxycodone Nausea/ Vomiting       Social History     Tobacco Use   . Smoking status: Current Every Day Smoker     Packs/day: 1.00     Years: 20.00     Pack years: 20.00     Types: Cigarettes   . Smokeless tobacco: Never Used   Substance Use Topics   . Alcohol use: No       Family Medical History:     Problem Relation (Age of Onset)    COPD Mother    Coronary Artery Disease Father    Diabetes Mother, Sister, Brother, Paternal Grandmother        ROS: Other than ROS in the HPI, all other systems were negative.    DNR Status:  Full Code    EXAM:  Heart Rate: 100  BP (Non-Invasive): (!) 158/88  Respiratory Rate: (!) 22  SpO2: 94 %  General: appears chronically ill mildly short of breath at rest on conversing acutely ill.  s.   Eyes: Pupils equal and round, reactive to light and accomodation.  HEENT: Head atraumatic and normocephalic   Neck: No JVD or thyromegaly or lymphadenopathy   Lungs:  Diminished air entry at the bases   Cardiovascular: regular rate and rhythm, S1, S2 normal, no murmur  Abdomen: Soft, non-tender, Bowel sounds normal, No  hepatosplenomegaly   Extremities: extremities normal, atraumatic, no cyanosis or edema   Skin: Skin warm and dry   Neurologic: Grossly normal   Lymphatics: No lymphadenopathy   Psychiatric: Normal affect, behavior,     Labs:    I have reviewed all lab results.    Imaging Studies:    CXR  Generalized increase in interstitial lung markings both lungs,  appears new compared to CT of 10/26/2018. This may represent interstitial  lung disease versus mild interstitial pulmonary edema.      DVT RISK FACTORS HAVE BEN ASSESSED AND PROPHYLAXIS ORDERED (SEE RUBYONLINE - REFERENCE TOOLS - MD, DVT PROPHY OR POCKET CARD)    Assessment/Plan:     1. Acute on chronic hypoxemic respiratory failure      Secondary to acute exacerbation of COPD/sepsis      Start on Solu-Medrol, DuoNeb      BiPAP p.r.n. switch to O2 via nasal cannula    2. Simple Sepsis likely secondary to pneumonia      Lactic acid 2.4 on admission repeat lactic acid normal      Patient received IV fluid bolus as per protocol      Continue to follow on cultures      Start on Rocephin and doxy    3. Leukocytosis      Likely in the setting of pneumonia      Treat as above    4. History of hypertension       Continue with lisinopril, amlodipine    5. History of diabetes mellitus type 2      Hold off on glimepiride, pioglitazone      Continue with home medication of Lantus       switch to linagliptin    6. BMI of 35 indicating morbid obesity      Will benefit from weight loss    DVT prophylaxis. Lovenox SC.  Code status. Full code.  Patient's husband also present at the bedside      Portions of this note may be dictated using voice recognition software or a dictation service. Variances in spelling and vocabulary are possible and unintentional. Not all errors are caught/corrected. Please notify the [facility] if any discrepancies are noted or if the meaning of any statement is not clear.

## 2018-12-05 ENCOUNTER — Inpatient Hospital Stay (HOSPITAL_COMMUNITY): Payer: Medicaid Other

## 2018-12-05 DIAGNOSIS — I340 Nonrheumatic mitral (valve) insufficiency: Secondary | ICD-10-CM

## 2018-12-05 DIAGNOSIS — I361 Nonrheumatic tricuspid (valve) insufficiency: Secondary | ICD-10-CM

## 2018-12-05 LAB — CBC WITH DIFF
BASOPHIL #: 0 10*3/uL (ref 0.00–0.10)
BASOPHIL %: 0 % (ref 0–3)
EOSINOPHIL #: 0 10*3/uL (ref 0.00–0.50)
EOSINOPHIL %: 0 % (ref 0–5)
HCT: 37.6 % (ref 36.0–45.0)
HGB: 12.6 g/dL (ref 12.0–15.5)
LYMPHOCYTE #: 1 10*3/uL (ref 1.00–4.80)
LYMPHOCYTE %: 11 % — ABNORMAL LOW (ref 15–43)
MCH: 30.9 pg (ref 27.5–33.2)
MCHC: 33.6 g/dL (ref 32.0–36.0)
MCV: 92 fL (ref 82.0–97.0)
MONOCYTE #: 0.3 10*3/uL (ref 0.20–0.90)
MONOCYTE %: 3 % — ABNORMAL LOW (ref 5–12)
MPV: 9.3 fL (ref 7.4–10.5)
NEUTROPHIL #: 8 10*3/uL — ABNORMAL HIGH (ref 1.50–6.50)
NEUTROPHIL %: 86 % — ABNORMAL HIGH (ref 43–76)
PLATELETS: 262 10*3/uL (ref 150–450)
RBC: 4.09 10*6/uL (ref 4.00–5.10)
RDW: 13.8 % (ref 11.0–16.0)
WBC: 9.3 10*3/uL (ref 4.0–11.0)

## 2018-12-05 LAB — GLUCOSE - BMC/JMC ONLY: GLUCOSE: 512 mg/dL (ref 70–110)

## 2018-12-05 LAB — COMPREHENSIVE METABOLIC PROFILE - BMC/JMC ONLY
ALBUMIN/GLOBULIN RATIO: 1.2 (ref 0.8–2.0)
ALBUMIN: 3.3 g/dL — ABNORMAL LOW (ref 3.5–5.0)
ALKALINE PHOSPHATASE: 79 U/L (ref 38–126)
ALT (SGPT): 16 U/L (ref 14–54)
ANION GAP: 11 mmol/L (ref 3–11)
AST (SGOT): 14 U/L — ABNORMAL LOW (ref 15–41)
BILIRUBIN TOTAL: 0.4 mg/dL (ref 0.3–1.2)
BUN/CREA RATIO: 23 — ABNORMAL HIGH (ref 6–22)
BUN: 11 mg/dL (ref 6–20)
CALCIUM: 8.9 mg/dL (ref 8.8–10.2)
CHLORIDE: 100 mmol/L — ABNORMAL LOW (ref 101–111)
CO2 TOTAL: 27 mmol/L (ref 22–32)
CREATININE: 0.48 mg/dL (ref 0.44–1.00)
ESTIMATED GFR: >60 mL/min/{1.73_m2} (ref >60)
GLUCOSE: 380 mg/dL — ABNORMAL HIGH (ref 70–110)
POTASSIUM: 4.2 mmol/L (ref 3.4–5.1)
PROTEIN TOTAL: 6.1 g/dL — ABNORMAL LOW (ref 6.4–8.3)
SODIUM: 138 mmol/L (ref 136–145)

## 2018-12-05 LAB — ECG 12-LEAD
Atrial Rate: 132 {beats}/min
Calculated P Axis: 75 degrees
Calculated R Axis: 81 degrees
Calculated T Axis: -41 degrees
PR Interval: 128 ms
QRS Duration: 102 ms
QT Interval: 300 ms
QTC Calculation: 444 ms
Ventricular rate: 132 {beats}/min

## 2018-12-05 LAB — POC FINGERSTICK GLUCOSE - BMC/JMC (RESULTS)
GLUCOSE, POC: 348 mg/dL — ABNORMAL HIGH (ref 60–100)
GLUCOSE, POC: 338 mg/dL — ABNORMAL HIGH (ref 60–100)
GLUCOSE, POC: 461 mg/dL (ref 60–100)
GLUCOSE, POC: 353 mg/dL — ABNORMAL HIGH (ref 60–100)

## 2018-12-05 MED ORDER — INSULIN GLARGINE (U-100) 100 UNIT/ML SUBCUTANEOUS SOLUTION
15.0000 [IU] | Freq: Every day | SUBCUTANEOUS | Status: DC
Start: 2018-12-05 — End: 2018-12-05
  Administered 2018-12-05: 15 [IU] via SUBCUTANEOUS

## 2018-12-05 MED ORDER — PREDNISONE 20 MG TABLET
40.00 mg | ORAL_TABLET | Freq: Every morning | ORAL | Status: DC
Start: 2018-12-06 — End: 2018-12-07
  Administered 2018-12-06 – 2018-12-07 (×2): 40 mg via ORAL
  Filled 2018-12-05 (×2): qty 2

## 2018-12-05 MED ORDER — INSULIN GLARGINE (U-100) 100 UNIT/ML SUBCUTANEOUS SOLUTION
30.00 [IU] | Freq: Two times a day (BID) | SUBCUTANEOUS | Status: DC
Start: 2018-12-05 — End: 2018-12-07
  Administered 2018-12-05 – 2018-12-06 (×3): 30 [IU] via SUBCUTANEOUS

## 2018-12-05 MED ORDER — DEXTROSE 10 % IN WATER (D10W) BOLUS
125.00 mL | INJECTION | INTRAVENOUS | Status: DC | PRN
Start: 2018-12-05 — End: 2018-12-07

## 2018-12-05 MED ORDER — PERFLUTREN LIPID MICROSPHERES 1.1 MG/ML INTRAVENOUS SUSPENSION
2.00 mL | INTRAVENOUS | Status: AC
Start: 2018-12-05 — End: 2018-12-05
  Administered 2018-12-05: 10:00:00 2 mL via INTRAVENOUS

## 2018-12-05 MED ORDER — DEXTROSE 10 % IN WATER (D10W) BOLUS
125.00 mL | INJECTION | INTRAVENOUS | Status: DC | PRN
Start: 2018-12-05 — End: 2018-12-05

## 2018-12-05 MED ORDER — INSULIN REGULAR HUMAN 100 UNIT/ML INJECTION SSIP - CITY
1.00 [IU] | Freq: Four times a day (QID) | INTRAMUSCULAR | Status: DC
Start: 2018-12-05 — End: 2018-12-05

## 2018-12-05 MED ORDER — METHYLPREDNISOLONE SOD SUCCINATE 40 MG/ML SOLUTION FOR INJ. WRAPPER
40.0000 mg | Freq: Once | INTRAMUSCULAR | Status: AC
Start: 2018-12-05 — End: 2018-12-05
  Administered 2018-12-05: 18:00:00 40 mg via INTRAVENOUS
  Filled 2018-12-05: qty 1

## 2018-12-05 MED ORDER — INSULIN REGULAR HUMAN 100 UNIT/ML INJECTION SSIP - CITY
1.00 [IU] | Freq: Four times a day (QID) | INTRAMUSCULAR | Status: DC
Start: 2018-12-05 — End: 2018-12-07
  Administered 2018-12-05 (×2): 12 [IU] via SUBCUTANEOUS
  Administered 2018-12-06: 10 [IU] via SUBCUTANEOUS
  Administered 2018-12-06: 7 [IU] via SUBCUTANEOUS
  Administered 2018-12-06: 2 [IU] via SUBCUTANEOUS
  Administered 2018-12-06: 20:00:00 10 [IU] via SUBCUTANEOUS
  Administered 2018-12-07: 0 [IU] via SUBCUTANEOUS
  Administered 2018-12-07: 11:00:00

## 2018-12-05 MED ORDER — FUROSEMIDE 10 MG/ML INJECTION SOLUTION
20.00 mg | Freq: Two times a day (BID) | INTRAMUSCULAR | Status: DC
Start: 2018-12-05 — End: 2018-12-07
  Administered 2018-12-05 – 2018-12-07 (×5): 20 mg via INTRAVENOUS
  Filled 2018-12-05 (×5): qty 2

## 2018-12-05 NOTE — Nurses Notes (Signed)
Pt's FSBS is 461. Stat glucose lab draw ordered. Message sent to Dr Philip Aspen.

## 2018-12-05 NOTE — Nurses Notes (Signed)
Report received from RN Rachael  Rounded on patient asleep in bed  Pt on 4 lpm via NC  ADA diet - ACHS  Tele 1   Call light within reach, bed in low locked position, bed alarm activated.

## 2018-12-05 NOTE — Ancillary Notes (Signed)
Echo completed at bedside. Definity given and administered by myself.      [place], [facility]  12/05/2018, 11:29

## 2018-12-05 NOTE — Nurses Notes (Signed)
Critical glucose result called and found to be 512. Patient covered in [facility] as ordered by MD. Will continue to monitor.

## 2018-12-05 NOTE — Progress Notes (Signed)
IP PROGRESS NOTE      [name], [name]  Date of Admission:  12/04/2018  Date of Birth:  11-13-1955  Date of Service:  12/05/2018    Chief Complaint: sob  Subjective:breathing better, now on oxygen nc. states she is on 3L "only at nightime"  no fevers over night  states has been more sob when laying flat with more swelling in legs. will repeat Echo from last year. BNP noted. diurese  no cp. n/v no further c  BS noted    Vital Signs:  Temp (24hrs) Max:36.7 C (98.1 F)      Temperature: 36.6 C (97.8 F)  BP (Non-Invasive): (!) 169/73  MAP (Non-Invasive): 99 mmHG  Heart Rate: 100  Respiratory Rate: 20  Pain Score (Numeric, Faces): 0  SpO2: 96 %    Current Medications:    .  acetaminophen (TYLENOL) tablet, 650 mg, Oral, Q6H PRN    .  albuterol (PROVENTIL) 2.5mg / 0.5 mL nebulizer solution, 5 mg, Nebulization, 4x/day    And    .  ipratropium (ATROVENT) 0.02% nebulizer solution, 0.5 mg, Nebulization, 4x/day    .  amLODIPine (NORVASC) tablet, 10 mg, Oral, Daily    .  aspirin chewable tablet 81 mg, 81 mg, Oral, Daily    .  atorvastatin (LIPITOR) tablet, 40 mg, Oral, Daily    .  budesonide-formoterol (SYMBICORT) 160 mcg-4.5 mcg per inhalation oral inhaler - "Respiratory to administer", 1 Puff, Inhalation, 2x/day    .  cefTRIAXone (ROCEPHIN) 1 g in iso-osmotic 50 mL premix IVPB, 1 g, Intravenous, Q24H    .  SSIP insulin R human 100 units/mL injection, 1-12 Units, Subcutaneous, 4x/day AC    And    .  D10W bolus infusion 125 mL, 125 mL, Intravenous, Q15 Min PRN    .  doxycycline hyclate 100 mg in NS 100 mL IVPB, 100 mg, Intravenous, Q12H    .  enoxaparin (LOVENOX) 40 mg/0.4 mL SubQ injection, 40 mg, Subcutaneous, Daily    .  furosemide (LASIX) 10 mg/mL injection, 20 mg, Intravenous, 2x/day    .  insulin glargine (LANTUS) 100 units/mL injection, 17 Units, Subcutaneous, NIGHTLY    .  insulin glargine (LANTUS) 100 units/mL injection, 15 Units, Subcutaneous, Daily    .  linagliptin (TRADJENTA) tablet, 5 mg, Oral, Daily    .   lisinopril (PRINIVIL) tablet, 5 mg, Oral, Daily    .  methylPREDNISolone sod succ (SOLU-MEDROL) 40 mg/mL injection, 40 mg, Intravenous, Once    .  morphine 4 mg/mL injection, 4 mg, Intravenous, Q5 Min PRN    .  nitroGLYCERIN (NITROSTAT) sublingual tablet, 0.4 mg, Sublingual, Q5 Min PRN    .  NS 250 mL flush bag, , Intravenous, Q1H PRN    .  NS flush syringe, 10 mL, Intravenous, Q8HRS    .  NS flush syringe, 10 mL, Intravenous, Q1H PRN    .  [START ON 12/06/2018] predniSONE (DELTASONE) tablet, 40 mg, Oral, Daily with Breakfast        Today's Physical Exam:  General: appears in good health. No distress.   Eyes: Pupils equal and round, reactive to light and accomodation.   HENT:Head atraumatic and normocephalic   Neck: No JVD or thyromegaly or lymphadenopathy   Lungs: CTAB, non labored breathing, no rales or wheezing.    Cardiovascular: regular rate and rhythm, S1, S2 normal, no murmur,   Abdomen: Soft, non-tender, Bowel sounds normal, No hepatosplenomegaly   Extremities: extremities normal, atraumatic, no cyanosis or edema  Skin: Skin warm and dry   Neurologic: Grossly normal   Psychiatric: Normal affect, behavior,         I/O:  I/O last 24 hours:      Intake/Output Summary (Last 24 hours) at 12/05/2018 1002  Last data filed at 12/05/2018 0500  Gross per 24 hour   Intake 3624 ml   Output --   Net 3624 ml     I/O current shift:  No intake/output data recorded.      Labs  Please indicate ordered or reviewed)  Reviewed:   Lab Results for Last 24 Hours:    Results for orders placed or performed during the hospital encounter of 12/04/18 (from the past 24 hour(s))   LACTIC ACID TIMED   Result Value Ref Range    LACTIC ACID 1.9 0.5 - 2.0 mmol/L   POC FINGERSTICK GLUCOSE - [facility] (RESULTS)   Result Value Ref Range    GLUCOSE, POC 388 (H) 60 - 100 mg/dl   URINALYSIS WITH MICROSCOPIC REFLEX IF INDICATED [facility] ONLY   Result Value Ref Range    COLOR Light Yellow Light Yellow, Straw, Yellow    APPEARANCE Clear Clear    PH 5.5  <8.0    LEUKOCYTES Negative Negative WBCs/uL    NITRITE Negative Negative    PROTEIN Negative Negative, 10  mg/dL    GLUCOSE >=1000 (A) Negative mg/dL    KETONES Trace (A) Negative mg/dL    UROBILINOGEN 0.2  <=2.0 mg/dL    BILIRUBIN Negative Negative mg/dL    BLOOD Negative Negative mg/dL    SPECIFIC GRAVITY 1.020 <1.022   POC FINGERSTICK GLUCOSE - [facility] (RESULTS)   Result Value Ref Range    GLUCOSE, POC 352 (H) 60 - 100 mg/dl   POC FINGERSTICK GLUCOSE - [facility] (RESULTS)   Result Value Ref Range    GLUCOSE, POC 375 (H) 60 - 100 mg/dl   COMPREHENSIVE METABOLIC PROFILE - [facility] ONLY   Result Value Ref Range    SODIUM 138 136 - 145 mmol/L    POTASSIUM 4.2 3.4 - 5.1 mmol/L    CHLORIDE 100 (L) 101 - 111 mmol/L    CO2 TOTAL 27 22 - 32 mmol/L    ANION GAP 11 3 - 11 mmol/L    BUN 11 6 - 20 mg/dL    CREATININE 0.48 0.44 - 1.00 mg/dL    BUN/CREA RATIO 23 (H) 6 - 22    ESTIMATED GFR >60 >60 mL/min/1.11m^2    ALBUMIN 3.3 (L) 3.5 - 5.0 g/dL    CALCIUM 8.9 8.8 - 10.2 mg/dL    GLUCOSE 380 (H) 70 - 110 mg/dL    ALKALINE PHOSPHATASE 79 38 - 126 U/L    ALT (SGPT) 16 14 - 54 U/L    AST (SGOT) 14 (L) 15 - 41 U/L    BILIRUBIN TOTAL 0.4 0.3 - 1.2 mg/dL    PROTEIN TOTAL 6.1 (L) 6.4 - 8.3 g/dL    ALBUMIN/GLOBULIN RATIO 1.2 0.8 - 2.0   CBC WITH DIFF   Result Value Ref Range    WBC 9.3 4.0 - 11.0 x10^3/uL    RBC 4.09 4.00 - 5.10 x10^6/uL    HGB 12.6 12.0 - 15.5 g/dL    HCT 37.6 36.0 - 45.0 %    MCV 92.0 82.0 - 97.0 fL    MCH 30.9 27.5 - 33.2 pg    MCHC 33.6 32.0 - 36.0 g/dL    RDW 13.8 11.0 - 16.0 %    PLATELETS  262 150 - 450 x10^3/uL    MPV 9.3 7.4 - 10.5 fL    NEUTROPHIL % 86 (H) 43 - 76 %    LYMPHOCYTE % 11 (L) 15 - 43 %    MONOCYTE % 3 (L) 5 - 12 %    EOSINOPHIL % 0 0 - 5 %    BASOPHIL % 0 0 - 3 %    NEUTROPHIL # 8.00 (H) 1.50 - 6.50 x10^3/uL    LYMPHOCYTE # 1.00 1.00 - 4.80 x10^3/uL    MONOCYTE # 0.30 0.20 - 0.90 x10^3/uL    EOSINOPHIL # 0.00 0.00 - 0.50 x10^3/uL    BASOPHIL # 0.00 0.00 - 0.10 x10^3/uL   POC FINGERSTICK GLUCOSE -  [facility] (RESULTS)   Result Value Ref Range    GLUCOSE, POC 348 (H) 60 - 100 mg/dl           Radiology Tests (Please indicate ordered or reviewed)  Reviewed: Xr Ap Mobile Chest    Result Date: 12/04/2018  RADIOLOGIST: Theressa Millard, MD EXAMINATION: XR AP MOBILE CHEST EXAM DATE/TIME: 12/04/2018 8:34 AM CLINICAL INDICATION: dyspnea COMPARISON: CT chest 10/26/2018 PULMONARY: There are increased interstitial lung markings in both lower lobes. Mild emphysematous changes in the upper lobes. CARDIOVASCULAR: There is mild cardiomegaly. Slight prominence of central vasculature. CHEST WALL/SPINE: Normal appearance. OTHER: None significant.      Generalized increase in interstitial lung markings both lungs, appears new compared to CT of 10/26/2018. This may represent interstitial lung disease versus mild interstitial pulmonary edema. Radiologist location ID: [id number]     Ecg 12-lead    Result Date: 12/05/2018  Sinus tachycardia Possible Left atrial enlargement ST & T wave abnormality, consider inferior ischemia Abnormal ECG When compared with ECG of 26-Oct-2018 17:12, No significant change was found SEE ED PROVIDER NOTE IN EPIC FOR FINAL INTERPRETATION Confirmed by BEST  DO, [place] ([id number]), editor Dusing, [place] ([id number]) on 12/05/2018 9:34:36 AM        Problem List:  Active Hospital Problems   (*Primary Problem)    Diagnosis   . Acute hypoxemic respiratory failure (CMS HCC)       Assessment/ Plan:     1. Acute on chronic hypoxemic respiratory failure      Secondary to acute exacerbation of COPD/sepsis pneumonia suspected      Start on Solu-Medrol wean down and transition to Prednisone, DuoNeb      BiPAP p.r.n. switch to O2 via nasal cannula. on 3L at nightime at home    2. Simple Sepsis likely secondary to pneumonia      Lactic acid 2.4 on admission repeat lactic acid normal      stop fluids due to fluid overload      Continue to follow on cultures      Start on Rocephin and doxy, f/u cultures    3. Leukocytosis      Likely in the  setting of pneumonia      resolved    4. Fluid overload      BNP elevated, clinically with pitting edema     check Echo     Lasix 20mg  IV BID for now         5. History of hypertension       Continue with lisinopril, amlodipine    6. History of diabetes mellitus type 2      Hold off on glimepiride, pioglitazone      Continue with home medication of Lantus  -will increase dose monitor on steroids  switch to linagliptin    7. BMI of 35 indicating morbid obesity      Will benefit from weight loss    DVT prophylaxis. Lovenox SC.  Code status. Full code.   Disposition: cont steroids, nebs, diuresis, f/u echo and culture, home in another 1-2 days

## 2018-12-05 NOTE — Pharmacy (Signed)
Upon discharge, patient would like to use the discharge pharmacy service. I have updated the patient's preferred pharmacy to [facility] while she is here. Please send any discharge scripts to us, either electronically or if printed call the pharmacy. Thanks!

## 2018-12-05 NOTE — Care Plan (Signed)
Problem: Adult Inpatient Plan of Care  Goal: Plan of Care Review  Outcome: Ongoing (see interventions/notes)  Goal: Patient-Specific Goal (Individualized)  Outcome: Ongoing (see interventions/notes)  Goal: Absence of Hospital-Acquired Illness or Injury  Outcome: Ongoing (see interventions/notes)  Goal: Optimal Comfort and Wellbeing  Outcome: Ongoing (see interventions/notes)  Goal: Rounds/Family Conference  Outcome: Ongoing (see interventions/notes)     Problem: Gas Exchange Impaired  Goal: Optimal Gas Exchange  Outcome: Ongoing (see interventions/notes)     Problem: Pain Acute  Goal: Optimal Pain Control  Outcome: Ongoing (see interventions/notes)

## 2018-12-05 NOTE — Care Management Notes (Signed)
12/05/18 1300   Assessment Detail   Assessment Type Admission   Date of Care Management Update 12/05/18   Social Work Plan   Discharge Planning Status initial meeting   Projected Discharge Date 12/07/18   Anticipated Discharge Disposition Home  (? Need Bipap)   Discharge Needs Assessment   Equipment Currently Used at Home oxygen   Referral Information   Admission Type inpatient   Arrived From home or self-care   Address Verified verified-no changes   Insurance Verified verified-no change   Source of Information Patient   Reason for Consult discharge planning   Living Environment   Lives With child(ren), adult;spouse   Able to Return to Prior Arrangements yes     Discharge planning-spoke with pt about discharge needs.  Pt lives with her spouse, and son (45 yr old).  Pt is normally independent with ADLs.  Durable medical equipment used in the home includes; Nebulizer, and  O2 3/L at night, with portability (supplied by [employer]) but has been using it cont for 1 1/2 weeks.  She does drive with no transportation issues.  She verbalized medication coverage with [employer] [facility], and obtains prescriptions from [facility] on [address].  Pt is active with PCP Dr Hubbard Robinson with prn visits.  She sleeps in a recliner at night, and  inquired about a need for BIPAP for home use.  Secure chat to Dr. Philip Aspen.  Please consult CM for any discharge concerns.

## 2018-12-06 LAB — CBC W/AUTO DIFF
BASOPHIL #: 0 10*3/uL (ref 0.00–0.10)
BASOPHIL %: 0 % (ref 0–3)
EOSINOPHIL #: 0 10*3/uL (ref 0.00–0.50)
EOSINOPHIL %: 0 % (ref 0–5)
HCT: 37 % (ref 36.0–45.0)
HGB: 12.4 g/dL (ref 12.0–15.5)
LYMPHOCYTE #: 1.9 10*3/uL (ref 1.00–4.80)
LYMPHOCYTE %: 12 % — ABNORMAL LOW (ref 15–43)
MCH: 30.7 pg (ref 27.5–33.2)
MCHC: 33.5 g/dL (ref 32.0–36.0)
MCV: 91.6 fL (ref 82.0–97.0)
MONOCYTE #: 1.3 10*3/uL — ABNORMAL HIGH (ref 0.20–0.90)
MONOCYTE %: 9 % (ref 5–12)
MPV: 8.4 fL (ref 7.4–10.5)
NEUTROPHIL #: 11.8 10*3/uL — ABNORMAL HIGH (ref 1.50–6.50)
NEUTROPHIL %: 79 % — ABNORMAL HIGH (ref 43–76)
PLATELETS: 286 10*3/uL (ref 150–450)
RBC: 4.03 10*6/uL (ref 4.00–5.10)
RDW: 13.9 % (ref 11.0–16.0)
WBC: 15 10*3/uL — ABNORMAL HIGH (ref 4.0–11.0)

## 2018-12-06 LAB — BASIC METABOLIC PANEL
ANION GAP: 10 mmol/L (ref 3–11)
BUN/CREA RATIO: 31 — ABNORMAL HIGH (ref 6–22)
BUN: 16 mg/dL (ref 6–20)
CALCIUM: 9.2 mg/dL (ref 8.8–10.2)
CHLORIDE: 99 mmol/L — ABNORMAL LOW (ref 101–111)
CO2 TOTAL: 30 mmol/L (ref 22–32)
CREATININE: 0.52 mg/dL (ref 0.44–1.00)
ESTIMATED GFR: >60 mL/min/{1.73_m2} (ref >60)
GLUCOSE: 203 mg/dL — ABNORMAL HIGH (ref 70–110)
POTASSIUM: 3.8 mmol/L (ref 3.4–5.1)
SODIUM: 139 mmol/L (ref 136–145)

## 2018-12-06 LAB — PROCALCITONIN: PROCALCITONIN: <0.05 ng/mL (ref <0.50)

## 2018-12-06 LAB — POC FINGERSTICK GLUCOSE - BMC/JMC (RESULTS)
GLUCOSE, POC: 179 mg/dL — ABNORMAL HIGH (ref 60–100)
GLUCOSE, POC: 258 mg/dL — ABNORMAL HIGH (ref 60–100)
GLUCOSE, POC: 326 mg/dL — ABNORMAL HIGH (ref 60–100)
GLUCOSE, POC: 347 mg/dL — ABNORMAL HIGH (ref 60–100)

## 2018-12-06 MED ORDER — DOXYCYCLINE HYCLATE 100 MG TABLET
100.00 mg | ORAL_TABLET | Freq: Two times a day (BID) | ORAL | Status: DC
Start: 2018-12-06 — End: 2018-12-07
  Administered 2018-12-06 – 2018-12-07 (×2): 100 mg via ORAL
  Filled 2018-12-06 (×2): qty 1

## 2018-12-06 NOTE — Progress Notes (Signed)
[facility]  [place], [place] [address]    IP PROGRESS NOTE    Shellee Milo DNP, FNP-BC      Wynn Maudlin  Date of Admission:  12/04/2018  Date of Birth:  04/20/1955  Date of Service:  12/06/2018    Chief Complaint: continues with shortness of breath, "I am not myself yet"  Subjective: seen during morning rounds, feeling better but not at baseline, 4 L NC to maintain, leg swelling about the same, as above    Assessment/ Plan:   Acute on chronic hypoxemic respiratory failure  -likely acute exacerbation of COPD/ pneumonia suspected  -continue po Prednisone       -DuoNeb    - continue 4 L NC, on 3L at nightime at home    Simple Sepsis likely secondary to pneumonia, resolved  -Lactic acid 2.4 on admission repeat lactic acid normal  - Continue to follow on cultures   - continue Rocephin and doxy, f/u cultures    Leukocytosis   -Likely in the setting of pneumonia   - slightly elevated today    Fluid overload       -BNP elevated, clinically with pitting edema      -check Echo       -Lasix 20mg  IV BID       History of hypertension  Continue with lisinopril,amlodipine    History of diabetes mellitus type2  Hold off on glimepiride,pioglitazone  Continue with home medication of Lantus  -will increase dose monitor on steroids  switch to linagliptin    BMI of 35 indicating morbid obesity  Will benefit from weight loss    DVT prophylaxis. Lovenox SC.  Code status. Full code.  Disposition: cont steroids, nebs, diuresis, f/u echo and culture, home in another 1-2 days              Vital Signs:  Temp (24hrs) Max:36.5 C (97.7 F)      Temperature: 36.4 C (97.6 F)  BP (Non-Invasive): (!) 157/77  MAP (Non-Invasive): 97 mmHG  Heart Rate: (!) 101  Respiratory Rate: 18  Pain Score (Numeric, Faces): 0  SpO2: 96 %    Current Medications:    .  acetaminophen (TYLENOL) tablet, 650 mg, Oral, Q6H PRN    .  albuterol (PROVENTIL) 2.5mg / 0.5 mL  nebulizer solution, 5 mg, Nebulization, 4x/day    And    .  ipratropium (ATROVENT) 0.02% nebulizer solution, 0.5 mg, Nebulization, 4x/day    .  amLODIPine (NORVASC) tablet, 10 mg, Oral, Daily    .  aspirin chewable tablet 81 mg, 81 mg, Oral, Daily    .  atorvastatin (LIPITOR) tablet, 40 mg, Oral, Daily    .  budesonide-formoterol (SYMBICORT) 160 mcg-4.5 mcg per inhalation oral inhaler - "Respiratory to administer", 1 Puff, Inhalation, 2x/day    .  cefTRIAXone (ROCEPHIN) 1 g in iso-osmotic 50 mL premix IVPB, 1 g, Intravenous, Q24H    .  SSIP insulin R human 100 units/mL injection, 1-12 Units, Subcutaneous, 4x/day AC    And    .  D10W bolus infusion 125 mL, 125 mL, Intravenous, Q15 Min PRN    .  doxycycline hyclate 100 mg in NS 100 mL IVPB, 100 mg, Intravenous, Q12H    .  enoxaparin (LOVENOX) 40 mg/0.4 mL SubQ injection, 40 mg, Subcutaneous, Daily    .  furosemide (LASIX) 10 mg/mL injection, 20 mg, Intravenous, 2x/day    .  insulin glargine (LANTUS) 100 units/mL injection, 30 Units,  Subcutaneous, 2x/day    .  linagliptin (TRADJENTA) tablet, 5 mg, Oral, Daily    .  lisinopril (PRINIVIL) tablet, 5 mg, Oral, Daily    .  morphine 4 mg/mL injection, 4 mg, Intravenous, Q5 Min PRN    .  nitroGLYCERIN (NITROSTAT) sublingual tablet, 0.4 mg, Sublingual, Q5 Min PRN    .  NS 250 mL flush bag, , Intravenous, Q1H PRN    .  NS flush syringe, 10 mL, Intravenous, Q8HRS    .  NS flush syringe, 10 mL, Intravenous, Q1H PRN    .  predniSONE (DELTASONE) tablet, 40 mg, Oral, Daily with Breakfast        Today's Physical Exam:   General: appears in good health. No distress.   Eyes: Pupils equal and round, reactive to light and accomodation.   HENT:Head atraumatic and normocephalic   Neck: No JVD or thyromegaly or lymphadenopathy   Lungs: CTAB, non labored breathing, no rales or wheezing.    Cardiovascular: regular rate and rhythm, S1, S2 normal, no murmur,   Abdomen: Soft, non-tender, Bowel sounds normal, No hepatosplenomegaly   Extremities:  extremities normal, atraumatic, no cyanosis or edema   Skin: Skin warm and dry   Neurologic: Grossly normal   Psychiatric: Normal affect, behavior  I/O:  I/O last 24 hours:      Intake/Output Summary (Last 24 hours) at 12/06/2018 0942  Last data filed at 12/06/2018 0400  Gross per 24 hour   Intake 1190 ml   Output --   Net 1190 ml     I/O current shift:  No intake/output data recorded.      Labs  Please indicate ordered or reviewed)  Reviewed: I have reviewed all lab results.        Radiology Tests (Please indicate ordered or reviewed)  Interpreted by radiologist and individually reviewed by me: N/A    Reviewed: Xr Ap Mobile Chest    Result Date: 12/04/2018  RADIOLOGIST: Theressa Millard, MD EXAMINATION: XR AP MOBILE CHEST EXAM DATE/TIME: 12/04/2018 8:34 AM CLINICAL INDICATION: dyspnea COMPARISON: CT chest 10/26/2018 PULMONARY: There are increased interstitial lung markings in both lower lobes. Mild emphysematous changes in the upper lobes. CARDIOVASCULAR: There is mild cardiomegaly. Slight prominence of central vasculature. CHEST WALL/SPINE: Normal appearance. OTHER: None significant.      Generalized increase in interstitial lung markings both lungs, appears new compared to CT of 10/26/2018. This may represent interstitial lung disease versus mild interstitial pulmonary edema. Radiologist location ID: [id number]   Problem List:  Active Hospital Problems   (*Primary Problem)    Diagnosis   . Acute hypoxemic respiratory failure (CMS HCC)       Disposition:   Discharge will be pending test results, need for further testing, or further complications.    Shellee Milo DNP, FNP-BC

## 2018-12-06 NOTE — Pharmacy (Signed)
[place] [facility] IV to PO Changes: Doxycycline   Brielynn, Nabhan, 63 y.o. female  Date of Birth:  28-Jun-1955  Date of Admission:  12/04/2018  MRN# [id number]    Per the [facility] IV to PO Policy, the following changes have been made:    Original Order: Doxycycline 100 mg , IV, Every 12 hours    New Order: Doxycycline 100 mg , Oral, 2 times daily    Please contact pharmacy with any questions.    [place], [place]   12/06/2018, 15:25

## 2018-12-06 NOTE — Nurses Notes (Signed)
Pt upset at the amount of "needles" she is getting. Pt states she isn't going to check her fsbs or take any insulin when she gets home because she is tired of being stuck. Pt states "I'm not doing this anymore, I don't care what my blood sugar is". Attempted to educate patient on the importance of management of blood sugar, Pt told this nurse to "save the lectures".

## 2018-12-07 LAB — POC FINGERSTICK GLUCOSE - BMC/JMC (RESULTS): GLUCOSE, POC: 82 mg/dL (ref 60–100)

## 2018-12-07 MED ORDER — ALBUTEROL SULFATE CONCENTRATE 2.5 MG/0.5 ML SOLUTION FOR NEBULIZATION
2.50 mg | INHALATION_SOLUTION | RESPIRATORY_TRACT | Status: DC | PRN
Start: 2018-12-07 — End: 2018-12-07
  Administered 2018-12-07: 2.5 mg via RESPIRATORY_TRACT
  Filled 2018-12-07: qty 1

## 2018-12-07 MED ORDER — PREDNISONE 20 MG TABLET
40.00 mg | ORAL_TABLET | Freq: Every morning | ORAL | 0 refills | Status: DC
Start: 2018-12-07 — End: 2019-03-08

## 2018-12-07 MED ORDER — IPRATROPIUM BROMIDE 0.02 % SOLUTION FOR INHALATION
0.50 mg | RESPIRATORY_TRACT | Status: DC | PRN
Start: 2018-12-07 — End: 2018-12-07
  Administered 2018-12-07: 0.5 mg via RESPIRATORY_TRACT
  Filled 2018-12-07: qty 1

## 2018-12-07 MED ORDER — DOXYCYCLINE HYCLATE 100 MG TABLET
100.00 mg | ORAL_TABLET | Freq: Two times a day (BID) | ORAL | 0 refills | Status: DC
Start: 2018-12-07 — End: 2018-12-12

## 2018-12-07 MED ORDER — LANTUS SOLOSTAR U-100 INSULIN 100 UNIT/ML (3 ML) SUBCUTANEOUS PEN
30.00 [IU] | PEN_INJECTOR | Freq: Every evening | SUBCUTANEOUS | 1 refills | Status: DC
Start: 2018-12-07 — End: 2019-03-08

## 2018-12-07 MED ORDER — INSULIN GLARGINE (U-100) 100 UNIT/ML SUBCUTANEOUS SOLUTION
40.00 [IU] | Freq: Two times a day (BID) | SUBCUTANEOUS | Status: DC
Start: 2018-12-07 — End: 2018-12-07
  Administered 2018-12-07: 40 [IU] via SUBCUTANEOUS

## 2018-12-07 NOTE — Discharge Summary (Signed)
[facility]  [place], [place] [address]    DISCHARGE SUMMARY      PATIENT NAME:  [name], [name]  MRN:  [id number]  DOB:  [birth year]    ADMISSION DATE:  12/04/2018  DISCHARGE DATE:  12/07/2018    ATTENDING PHYSICIAN: Mat Carne, MD  PRIMARY CARE PHYSICIAN: Hubbard Robinson, DO     ADMISSION DIAGNOSIS: Acute exacerbation of chronic obstructive pulmonary disease (COPD) (CMS HCC)  DISCHARGE DIAGNOSIS:   Active Hospital Problems    Diagnosis Date Noted   . Principle Problem: Acute exacerbation of chronic obstructive pulmonary disease (COPD) (CMS HCC) [J44.1] 03/27/2018   . Acute hypoxemic respiratory failure (CMS HCC) [J96.01] 12/04/2018   . Tobacco use disorder [F17.200] 05/22/2017   . 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 Diabetes (CMS [place]) [E11.9] 11/18/2015   . Obesity (BMI 35.0-39.9 without comorbidity) [E66.9] 10/08/2012      Resolved Hospital Problems   No resolved problems to display.     Active Non-Hospital Problems    Diagnosis Date Noted   . Sepsis 10/27/2018   . CAP (community acquired pneumonia) 10/26/2018   . COPD with acute exacerbation (CMS HCC) 05/23/2017   . Acute respiratory failure (CMS HCC) 05/21/2017   . Non-compliance 03/27/2017      DISCHARGE MEDICATIONS:     Current Discharge Medication List      START taking these medications.      Details   doxycycline 100 mg Tablet   100 mg, Oral, 2 TIMES DAILY  Qty: 10 Tab  Refills: 0     predniSONE 20 mg Tablet  Commonly known as: DELTASONE   40 mg, Oral, EVERY MORNING WITH BREAKFAST  Qty: 10 Tab  Refills: 0        CONTINUE these medications which have CHANGED during your visit.      Details   Lantus Solostar U-100 Insulin 100 unit/mL Insulin Pen  Generic drug: insulin glargine  What changed: how much to take   30 Units, Subcutaneous, NIGHTLY  Qty: 1 Box  Refills: 1        CONTINUE these medications - NO CHANGES were made during your visit.      Details   * albuterol 2.5 mg/0.5 mL Solution for  Nebulization  Commonly known as: PROVENTIL   USE 1 VIAL IN NEBULIZER EVERY 4 HOURS  Qty: 30 Each  Refills: 2     * albuterol 2.5 mg/0.5 mL Solution for Nebulization  Commonly known as: PROVENTIL   2.5 mg, Nebulization, 4 TIMES DAILY  Qty: 50 Each  Refills: 0     amLODIPine 10 mg Tablet  Commonly known as: NORVASC   TAKE 1 TABLET BY MOUTH ONCE DAILY  Qty: 30 Tab  Refills: 3     aspirin 81 mg Tablet, Chewable   81 mg, Oral, DAILY  Refills: 0     atorvastatin 40 mg Tablet  Commonly known as: LIPITOR   40 mg, Oral, DAILY  Qty: 90 Tab  Refills: 4     Benzonatate 200 mg Capsule  Commonly known as: TESSALON   200 mg, Oral, EVERY 8 HOURS PRN  Qty: 10 Cap  Refills: 0     fluticasone propion-salmeteroL 500-50 mcg/dose Disk with Device oral diskus inhaler  Commonly known as: ADVAIR   INHALE 1 DOSE BY MOUTH TWICE DAILY  Qty: 1 Inhaler  Refills: 3     furosemide 20 mg Tablet  Commonly known as: LASIX   Take 1/2 (  one-half) tablet by mouth once daily  Qty: 45 Tab  Refills: 1     * ipratropium 0.02 % Solution  Commonly known as: ATROVENT   0.5 mg, Nebulization, EVERY 4 HOURS  Qty: 30 Vial  Refills: 2     * Atrovent HFA 17 mcg/actuation HFA Aerosol Inhaler oral inhaler  Generic drug: ipratropium bromide   2 Puffs, Inhalation, 4 TIMES DAILY  Qty: 13 g  Refills: 4     lisinopriL 5 mg Tablet  Commonly known as: PRINIVIL   Take 1 tablet by mouth once daily  Qty: 30 Tab  Refills: 3     pioglitazone 15 mg Tablet  Commonly known as: ACTOS   Take 1 tablet by mouth once daily  Qty: 30 Tab  Refills: 3         * This list has 4 medication(s) that are the same as other medications prescribed for you. Read the directions carefully, and ask your doctor or other care provider to review them with you.            STOP taking these medications.    glimepiride 4 mg Tablet  Commonly known as: AMARYL     Januvia 100 mg Tablet  Generic drug: SITagliptin          DISCHARGE INSTRUCTIONS:      DISCHARGE INSTRUCTION - DIABETIC DIET     Diet: DIABETIC DIET       DISCHARGE INSTRUCTION - ACTIVITY     Activity: GRADUALLY INCREASE ACTIVITY AS TOLERATED      ASPIRIN ALREADY ORDERED     REASON FOR HOSPITALIZATION AND HOSPITAL COURSE:  This is a 63 y.o., female patient came to [place] with shortness of seizure admitted and treated for acute respiratory failure from COPD exacerbation.  Sepsis secondary to pneumonia.  Resolved.  Breathing has improved.  Says she feels better and wants to go home.  Discharged home.  Discharge time spent 40 minutes.        Physical exam :  GENERAL: The patient is alert and oriented to time, place and person.  No distress.  Obesity  HEENT: Normocephalic, anicteric. No pallor. PERRL.   NECK: Supple. No jugular venous distention.   LUNGS:  Bilateral minimal wheezing.  No rales.  HEART: Both first and second sounds are audible, regular sinus rhythm. No murmur.  ABDOMEN: Soft, bowel sounds are present, nontender, no hepatosplenomegaly.   EXTREMITIES: No edema. Peripheral pulses are present.   CENTRAL NERVOUS SYSTEM: No focal deficit, no cranial nerve palsy.   SKIN: No rashes or bruises.   MUSCULOSKELETAL SYSTEM: No deformity or swelling.   PSYCHIATRIC: No anxiety or depression.   HEMATOLOGIC: No ecchymosis, petechia or hematoma.      COURSE IN HOSPITAL: As above.  Please see Dr. Sharlotte Alamo  H&P for admission details.    1. Acute respiratory failure with hypoxia.  Resolved.  Continue oxygen via nasal cannula for chronic respiratory failure with hypoxia.  2. COPD with acute exacerbation. Continue prednisone for 5 more days.  Advair with albuterol as needed.  3. Pneumonia with sepsis.  Resolved.  Lactic acid, leukocytosis improved.  No fever.  Continue doxycycline outpatient for 5 more days.  4. Hypertension.  Continue Norvasc, Lasix, lisinopril.  5. Type 2 diabetes.  Continue Lantus lactulose with sliding scale coverage. Amaryl and Januvia discontinued by PCP per patient. Lantus dose increased this admission.  6. Obesity.  BMI 34.  Would benefit from weight  loss.  CONDITION ON DISCHARGE: Alert, Oriented and VS Stable    DISCHARGE DISPOSITION:  Home discharge     cc: Primary Care Physician:  Hubbard Robinson, DO  [address]  [place] [address]     [id number] Physician:  No referring provider defined for this encounter.

## 2018-12-07 NOTE — Nurses Notes (Signed)
IV and tele removed. Pt denied any complaints. Discharge instructions, including new prescriptions and f/u appts given and explained. Pt verbalized understanding. Pt taken off floor via wheelchair with RN to private car.

## 2018-12-07 NOTE — Care Plan (Signed)
Problem: Adult Inpatient Plan of Care  Goal: Plan of Care Review  Outcome: Ongoing (see interventions/notes)     Problem: Adult Inpatient Plan of Care  Goal: Absence of Hospital-Acquired Illness or Injury  Outcome: Ongoing (see interventions/notes)     Problem: Gas Exchange Impaired  Goal: Optimal Gas Exchange  Outcome: Ongoing (see interventions/notes) 02 maintained nebs also lungs diminished in bases  Problem: Pain Acute  Goal: Optimal Pain Control  Outcome: Ongoing (see interventions/notes) pt is pain free

## 2018-12-07 NOTE — Care Management Notes (Addendum)
Secure chat to Dr. Christa See to f/u if pt will need a BiPAP upon discharge.    0958-informed by Dr. Christa See (via secure chat) that pt will not need a BiPAP for home.

## 2018-12-09 LAB — ADULT ROUTINE BLOOD CULTURE, SET OF 2 BOTTLES (BACTERIA AND YEAST)
BLOOD CULTURE, ROUTINE: NO GROWTH
BLOOD CULTURE, ROUTINE: NO GROWTH

## 2018-12-10 ENCOUNTER — Telehealth (HOSPITAL_BASED_OUTPATIENT_CLINIC_OR_DEPARTMENT_OTHER): Payer: Self-pay

## 2018-12-10 NOTE — Telephone Encounter (Signed)
Transition of Care Contact  Discharge Date: 12/07/2018  Transition Facility Type--Hospital (Inpatient or Observation)  [facility]  Interactive Contact(s): Completed or attempted contact indicated by Date/Time  Completed Contact--12/10/2018  3:14 PM  First Attempt  Second Attempt  Third Attempt  Contact Method(s)-- Patient/Caregiver Telephone  Clinical Staff Name/Role who Tomi Bamberger, RN  Transition Assessment  Discharge Summary obtained?--Yes  How are you recovering?--Improving  Discharge Meds obtained?--Yes  Medications reconcilled with Discharge Documentation?--Yes  Medication understanding --knows new medication(s)--knows purpose of medication  Medication Concerns?--No  Have everything needed for recovery?--Yes  Care Coordination:   Patient has transition follow-up appointment date and time?--Yes  Primary Care Transition Visit planned?--Yes  Specialist Transition Visit planned?--No  Patient/caregiver plans to attend transition visit?--Yes  Primary Follow-up Barrier  Interventions provided --reinforced discharge instructions--follow-up appointment date/time reinforced  Home Health or DME ordered at discharge?--No  Clinician/Team notified?--No  Primary reason clinician notified?  Transition Note:   No data   Further information may be documented in relevant telephone or outreach encounter.    TCM outreach completed.  Patient states she is doing well and denies any SOB, cough or chest pain at this time.  She picked up her prescriptions and has taken them without issue.  She does not have a hospital follow up with Dr. Sabra Heck; appt made for 12/2 at 1430.  She denies any other questions or concerns.

## 2018-12-12 ENCOUNTER — Other Ambulatory Visit: Payer: Self-pay

## 2018-12-12 ENCOUNTER — Ambulatory Visit (INDEPENDENT_AMBULATORY_CARE_PROVIDER_SITE_OTHER): Payer: Medicaid Other | Admitting: Family Medicine

## 2018-12-12 VITALS — BP 174/76 | HR 108 | Temp 98.8°F | Resp 28 | Ht 63.0 in | Wt 204.6 lb

## 2018-12-12 DIAGNOSIS — J189 Pneumonia, unspecified organism: Secondary | ICD-10-CM

## 2018-12-12 DIAGNOSIS — Z09 Encounter for follow-up examination after completed treatment for conditions other than malignant neoplasm: Secondary | ICD-10-CM

## 2018-12-12 DIAGNOSIS — J449 Chronic obstructive pulmonary disease, unspecified: Secondary | ICD-10-CM

## 2018-12-12 DIAGNOSIS — Z794 Long term (current) use of insulin: Secondary | ICD-10-CM

## 2018-12-12 DIAGNOSIS — I10 Essential (primary) hypertension: Secondary | ICD-10-CM

## 2018-12-12 DIAGNOSIS — E119 Type 2 diabetes mellitus without complications: Secondary | ICD-10-CM

## 2018-12-12 DIAGNOSIS — F17210 Nicotine dependence, cigarettes, uncomplicated: Secondary | ICD-10-CM

## 2018-12-12 MED ORDER — BREO ELLIPTA 200 MCG-25 MCG/DOSE POWDER FOR INHALATION
1.0000 | DISK | Freq: Every day | RESPIRATORY_TRACT | 1 refills | Status: DC
Start: 2018-12-12 — End: 2019-07-08

## 2018-12-12 NOTE — Nursing Note (Signed)
Chief Complaint:   [facility] Follow Up         Functional Health Screen  Functional Health Screening:   Patient is under 18: No  Have you had a recent unexplained weight loss or gain?: No  Because we are aware of abuse and domestic violence today, we ask all patients: Are you being hurt, hit, or frightened by anyone at your home or in your life?: No  Do you have any basic needs within your home that are not being met? (such as Food, Shelter, Games developer, Transportation): No  Patient is under 18 and therefore has no Advance Directives: No  Patient has: No Advance  Patient has Advance Directive: No  Patient offered: Refused Packet  Screening unable to be completed: No       BP (!) 174/76   Pulse (!) 108   Temp 37.1 C (98.8 F) (Oral)   Resp (!) 28   Ht 1.6 m (_0 )   Wt 92.8 kg (204 lb 9.6 oz)   LMP  (LMP Unknown)   SpO2 92%   BMI 36.24 kg/m       Social History     Tobacco Use   Smoking Status Current Every Day Smoker   . Packs/day: 1.00   . Years: 20.00   . Pack years: 20.00   . Types: Cigarettes   Smokeless Tobacco Never Used     Patient Health Rating           Depression Screening  PHQ Questionnaire     Allergies:  Allergies   Allergen Reactions   . Codeine Nausea/ Vomiting     Sick to stomach   . Hydrocodone Nausea/ Vomiting   . Metformin Diarrhea   . Oxycodone Nausea/ Vomiting     Medication History  Reviewed for OTC medication and any new medications, provider will review medication history  Results through Enter/Edit  No results found for this or any previous visit (from the past 24 hour(s)).  POCT Results  Care Team  Patient Care Team:  Hubbard Robinson, DO as PCP - General ([place])  Immunizations - last 24 hours     None        Maye Hides, LPN  28/0/0349, 17:91

## 2018-12-12 NOTE — Progress Notes (Signed)
[facility], [facility]  [place]  [place] [address]    Transitional Care Management Visit    Name: [name]  MRN: [id number]    Date: 12/12/2018  Age: 63 y.o.      Transition of Care Contact  Discharge Date: 12/07/2018  Transition Facility Type--Hospital (Inpatient or Observation)  [facility]  Interactive Contact(s): Completed or attempted contact indicated by Date/Time  Completed Contact--12/10/2018  3:14 PM  First Attempt  Second Attempt  Third Attempt  Contact Method(s)-- Patient/Caregiver Telephone  Clinical Staff Name/Role who Tomi Bamberger, RN  Transition Assessment  Discharge Summary obtained?--Yes  How are you recovering?--Improving  Discharge Meds obtained?--Yes  Medications reconcilled with Discharge Documentation?--Yes  Medication understanding --knows new medication(s)--knows purpose of medication  Medication Concerns?--No  Have everything needed for recovery?--Yes  Care Coordination:   Patient has transition follow-up appointment date and time?--Yes  Primary Care Transition Visit planned?--Yes  Specialist Transition Visit planned?--No  Patient/caregiver plans to attend transition visit?--Yes  Primary Follow-up Barrier  Interventions provided --reinforced discharge instructions--follow-up appointment date/time reinforced  Home Health or DME ordered at discharge?--No  Clinician/Team notified?--No  Primary reason clinician notified?  Transition Note:   No data   Further information may be documented in relevant telephone or outreach encounter.          Chief Complaint: Hospital Follow Up and Hospital Discharge Transition    History of Present Illness:  [name] is a 63 y.o. female is seen for transitional care management.  Admitted for COPD exacerbation and pneumonia.  States she finished antibiotic yesterday and has one more day of steroids.  Still feels a heaviness in her chest.  Still continues to smoke but reports she has  cut back.  She has been taking her medication as she was told but she is confused as she thought the hospital took her off of all of her medications except three.  Her BP is elevated today because of not being on any BP medications. No new concerns today.      Past Medical History:  She has a past medical history of COPD (chronic obstructive pulmonary disease) (CMS HCC), Degenerative joint disease involving multiple joints, Diabetes mellitus (CMS [place]), HTN (hypertension), Smoker, Supplemental oxygen dependent, and Wears glasses. She also has no past medical history of Cancer (CMS [place]), Congestive heart failure (CMS HCC), Convulsions (CMS HCC), CVA (cerebrovascular accident) (CMS [facility]), Thyroid disease, or Wears dentures.    Past Surgical History:  She has a past surgical history that includes hx carpal tunnel release.    Problem List:  She has Obesity (BMI 35.0-39.9 without comorbidity); Diabetes (CMS [place]); Hypertension due to endocrine disorder; Non-compliance; Acute respiratory failure (CMS HCC); Tobacco use disorder; COPD with acute exacerbation (CMS HCC); Acute exacerbation of chronic obstructive pulmonary disease (COPD) (CMS HCC); CAP (community acquired pneumonia); Sepsis; and Acute hypoxemic respiratory failure (CMS HCC) on their problem list.    Medications:  .  albuterol  .  albuterol  .  amLODIPine  .  aspirin  .  atorvastatin  .  Breo Ellipta  .  fluticasone propion-salmeteroL  .  furosemide  .  Lantus Solostar U-100 Insulin  .  ipratropium  .  Atrovent HFA  .  lisinopriL  .  pioglitazone  .  predniSONE     Review of Systems:  Review of Systems   Pertinent items are noted in HPI. All pertinent positives and negatives noted in the HPI  Constitutional: No  recent illness, no fever, no chills, no night sweats, no weight loss.  Neuro: No dizziness, No lightheadedness, No syncope, no hx of seizures  Eyes: No blurring of vision or diplopia.  ENT:No sore throat. No dysphagia, No odynophagia, No hearing loss, No  tinnitus, No rhinorrhea, No sinus pains.  Respiratory: + cough and SOB  Cardiovascular: no chest pain  No palpitation, No exertional dyspnea, No claudications, No pedal edema.  Muskuloskeletal: No myalgias, No arthralgias.  GI.no abdominal pain,nausea,vomiting,diarrhea.  GU. no dysuria,frequency of urination.  Skin: no rashes.       Exam:   Vitals:    12/12/18 1442   BP: (!) 174/76   Pulse: (!) 108   Resp: (!) 28   Temp: 37.1 C (98.8 F)   TempSrc: Oral   SpO2: 92%   Weight: 92.8 kg (204 lb 9.6 oz)   Height: 1.6 m (5\' 3" )   BMI: 36.32       Data Reviewed:   Medical records review.      Assessment/Plan:      ICD-10-CM    1. Hospital discharge follow-up  Z09 XR CHEST AP AND LATERAL   2. COPD (chronic obstructive pulmonary disease) (CMS HCC)  J44.9 Refer to [facility]     fluticasone furoate-vilanteroL (BREO ELLIPTA) 200-25 mcg/dose Inhalation Disk with Device   3. Pneumonia  J18.9      Orders Placed This Encounter   . XR CHEST AP AND LATERAL   . Refer to [facility]   . fluticasone furoate-vilanteroL (BREO ELLIPTA) 200-25 mcg/dose Inhalation Disk with Device     Transitional Care services provided:  Discharge documentation was reviewed  No follow-ups on file.     Pneumonia/COPD - Switch to BREO, needs to quit smoking, send to pulmonology.  Recheck chest xray.  O2 92% today which is par for her.  Finish steroid.  Follow up in three months.      Hubbard Robinson, DO  [place], [facility]  [place]  [place] [address]  [phone]

## 2018-12-16 ENCOUNTER — Other Ambulatory Visit (INDEPENDENT_AMBULATORY_CARE_PROVIDER_SITE_OTHER): Payer: Self-pay | Admitting: Family Medicine

## 2018-12-16 DIAGNOSIS — Z794 Long term (current) use of insulin: Secondary | ICD-10-CM

## 2018-12-16 DIAGNOSIS — E119 Type 2 diabetes mellitus without complications: Secondary | ICD-10-CM

## 2018-12-18 ENCOUNTER — Other Ambulatory Visit: Payer: Self-pay

## 2018-12-18 ENCOUNTER — Ambulatory Visit
Admission: RE | Admit: 2018-12-18 | Discharge: 2018-12-18 | Disposition: A | Discharge status: Home / Self Care | Payer: Medicaid Other | Source: Ambulatory Visit | Attending: Family Medicine | Admitting: Family Medicine

## 2018-12-18 DIAGNOSIS — Z09 Encounter for follow-up examination after completed treatment for conditions other than malignant neoplasm: Secondary | ICD-10-CM | POA: Insufficient documentation

## 2018-12-18 DIAGNOSIS — J439 Emphysema, unspecified: Secondary | ICD-10-CM | POA: Insufficient documentation

## 2018-12-19 NOTE — Progress Notes (Signed)
No pneumonia on xray.    Hubbard Robinson, DO  12/19/2018, 07:54

## 2018-12-22 ENCOUNTER — Other Ambulatory Visit (HOSPITAL_BASED_OUTPATIENT_CLINIC_OR_DEPARTMENT_OTHER): Payer: Self-pay | Admitting: Family Medicine

## 2018-12-24 NOTE — Telephone Encounter (Signed)
Refill request for Albuterol.    Rod Can, MA  12/24/2018, 09:00

## 2018-12-25 ENCOUNTER — Telehealth (HOSPITAL_BASED_OUTPATIENT_CLINIC_OR_DEPARTMENT_OTHER): Payer: Self-pay | Admitting: Family Medicine

## 2018-12-25 NOTE — Telephone Encounter (Signed)
Patient left voice mail requesting results of recent chest xray. Called her back and informed her per Dr. Sabra Heck no pneumonia on xray.     Maye Hides, LPN  [id number], [id number]

## 2019-01-18 ENCOUNTER — Other Ambulatory Visit (HOSPITAL_BASED_OUTPATIENT_CLINIC_OR_DEPARTMENT_OTHER): Payer: Self-pay | Admitting: Family Medicine

## 2019-01-18 NOTE — Telephone Encounter (Signed)
Refill request for Albuterol.    Rod Can, MA  01/18/2019, 13:45

## 2019-02-19 ENCOUNTER — Other Ambulatory Visit (HOSPITAL_BASED_OUTPATIENT_CLINIC_OR_DEPARTMENT_OTHER): Payer: Self-pay | Admitting: Family Medicine

## 2019-03-08 ENCOUNTER — Other Ambulatory Visit: Payer: Self-pay

## 2019-03-08 ENCOUNTER — Ambulatory Visit (INDEPENDENT_AMBULATORY_CARE_PROVIDER_SITE_OTHER): Payer: Medicaid Other | Admitting: Family Medicine

## 2019-03-08 ENCOUNTER — Encounter (HOSPITAL_BASED_OUTPATIENT_CLINIC_OR_DEPARTMENT_OTHER): Payer: Self-pay | Admitting: Family Medicine

## 2019-03-08 VITALS — BP 162/80 | HR 100 | Temp 97.7°F | Resp 20 | Ht 63.0 in | Wt 192.0 lb

## 2019-03-08 DIAGNOSIS — R4689 Other symptoms and signs involving appearance and behavior: Secondary | ICD-10-CM

## 2019-03-08 DIAGNOSIS — E119 Type 2 diabetes mellitus without complications: Secondary | ICD-10-CM

## 2019-03-08 DIAGNOSIS — Z794 Long term (current) use of insulin: Secondary | ICD-10-CM

## 2019-03-08 DIAGNOSIS — Z6834 Body mass index (BMI) 34.0-34.9, adult: Secondary | ICD-10-CM

## 2019-03-08 DIAGNOSIS — Z9111 Patient's noncompliance with dietary regimen: Secondary | ICD-10-CM

## 2019-03-08 DIAGNOSIS — F17210 Nicotine dependence, cigarettes, uncomplicated: Secondary | ICD-10-CM

## 2019-03-08 LAB — POCT EAST HGB A1C (AMB): POCT HGB A1C: 12.6 % — AB (ref 4–6)

## 2019-03-08 MED ORDER — LANTUS SOLOSTAR U-100 INSULIN 100 UNIT/ML (3 ML) SUBCUTANEOUS PEN
30.00 [IU] | PEN_INJECTOR | Freq: Every evening | SUBCUTANEOUS | 1 refills | Status: DC
Start: 2019-03-08 — End: 2019-06-20

## 2019-03-08 NOTE — Nursing Note (Signed)
03/08/19 0800   A1C   Time Performed 0843   A1C 12.6   References Ranges 4 - 6%   A1C Lot # [id number]   Expiration Date 05/15/19   Internal Control Valid yes   Initials [place]   Lancet used on  Finger

## 2019-03-08 NOTE — Addendum Note (Signed)
Addended by: Fuller Song ANNA on: 03/08/2019 08:46 AM     Modules accepted: Orders
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
## 2019-03-08 NOTE — Progress Notes (Signed)
[name]  Diabetes Check-up Visit      ID: [name] is a 64 y.o. female   Date of service: 03/08/2019     SUBJECTIVE:  [name] returns for follow-up of her diabetes.  Last visit she was started on Lantus 20 units.  Reports she was taking this nightly but did miss a few doses.  She is still drinking upward to 12 sodas per day.  She reports a lot of stress.  She is still smoking.  Reports she hardly eats.  She does have some financial circumstances which causes her to eat less healthy.      Diabetic Review of Systems:   Medication compliance:  noncompliant some of the time  Diabetic diet compliance:  noncompliant much of the time  Home glucose monitoring: are performed sporadically  Other diabetic ROS: no polyuria or polydipsia, no chest pain, dyspnea or TIAs, no numbness, tingling or pain in extremities    Current exercise: no regular exercise  Any episodes of hypoglycemia? no  Known diabetic complications: none  Cardiovascular risk factors: smoking/ tobacco exposure, dyslipidemia, diabetes mellitus, obesity, stress  Eye exam current (within one year): no  Foot exam current (within one year): yes  Influenza vaccine current: yes  Pneumonia vaccine current: yes  Weight trend: stable        Other symptoms and concerns:   None      Medications:  Current Outpatient Medications   Medication Sig Dispense Refill   . albuterol (PROVENTIL) 2.5 mg/0.5 mL Inhalation Solution for Nebulization USE 1 VIAL IN NEBULIZER EVERY 4 HOURS 30 Each 2   . albuterol sulfate (PROVENTIL) 2.5 mg /3 mL (0.083 %) Inhalation Solution for Nebulization USE 1 VIAL IN NEBULIZER 4 TIMES DAILY 150 mL 3   . amLODIPine (NORVASC) 10 mg Oral Tablet TAKE 1 TABLET BY MOUTH ONCE DAILY 30 Tab 3   . aspirin 81 mg Oral Tablet, Chewable Take 1 Tab (81 mg total) by mouth Once a day     . atorvastatin (LIPITOR) 40 mg Oral Tablet Take 1 tablet by mouth once daily 30 Tab 5   . fluticasone furoate-vilanteroL (BREO ELLIPTA) 200-25 mcg/dose  Inhalation Disk with Device Take 1 INHALATION by inhalation Once a day (Patient not taking: Reported on 03/08/2019) 60 Each 1   . fluticasone propion-salmeteroL (ADVAIR) 500-50 mcg/dose Inhalation Disk with Device oral diskus inhaler INHALE 1 DOSE BY MOUTH TWICE DAILY 1 Inhaler 3   . furosemide (LASIX) 20 mg Oral Tablet Take 1/2 (one-half) tablet by mouth once daily 45 Tab 1   . insulin glargine (LANTUS SOLOSTAR U-100 INSULIN) 100 unit/mL Subcutaneous Insulin Pen 30 Units by Subcutaneous route Every night 30 mL 1   . ipratropium (ATROVENT) 0.02 % Inhalation Solution 2.5 mL (0.5 mg total) by Nebulization route ONCE EVERY 4 HOURS 30 Vial 2   . ipratropium bromide (ATROVENT HFA) 17 mcg/actuation Inhalation HFA Aerosol Inhaler oral inhaler Take 2 INHALATION (2 Puffs total) by inhalation Four times a day 13 g 4   . lisinopriL (PRINIVIL) 5 mg Oral Tablet Take 1 tablet by mouth once daily 30 Tab 3     No current facility-administered medications for this visit.       ASCVD The 10-year ASCVD risk score [name] DC Jr., et al., 2013) is: 39.4%    Values used to calculate the score:      Age: 69 years      Sex: Female      Is  Non-Hispanic African American: No      Diabetic: Yes      Tobacco smoker: Yes      Systolic Blood Pressure: 0000000 mmHg      Is BP treated: Yes      HDL Cholesterol: 42 mg/dL      Total Cholesterol: 265 mg/dL     On a Statin? Yes   Needs High intensity Statin: Yes  On ACE inhibitor or angiotensin II receptor blocker? Yes   On Aspirin? Yes     Past Medical History:  Patient Active Problem List    Diagnosis Date Noted   . Acute hypoxemic respiratory failure (CMS HCC) 12/04/2018   . Sepsis 10/27/2018   . CAP (community acquired pneumonia) 10/26/2018   . Acute exacerbation of chronic obstructive pulmonary disease (COPD) (CMS HCC) 03/27/2018   . COPD with acute exacerbation (CMS HCC) 05/23/2017   . Tobacco use disorder 05/22/2017   . Acute respiratory failure (CMS HCC) 05/21/2017   . Non-compliance 03/27/2017   .  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 Diabetes (CMS [place]) 11/18/2015   . Obesity (BMI 35.0-39.9 without comorbidity) 10/08/2012         Social History:  Social History     Tobacco Use   . Smoking status: Current Every Day Smoker     Packs/day: 1.00     Years: 20.00     Pack years: 20.00     Types: Cigarettes   . Smokeless tobacco: Never Used   Substance Use Topics   . Alcohol use: No   . Drug use: No         OBJECTIVE:  BP (!) 162/80   Pulse 100   Temp 36.5 C (97.7 F) (Thermal Scan)   Resp 20   Ht 1.6 m (5\' 3" )   Wt 87.1 kg (192 lb)   LMP  (LMP Unknown)   BMI 34.01 kg/m         General: Well-appearing female, no acute distress.   Neck: Supple without JVD or bruit.   Heart: Normal S1, S2, Regular rate and rhythm without murmur or gallops.   Lungs: Distant but clear breath sounds.   Extremities: No cyanosis or edema. Distal pulses 2+.               Lab Review  Lab Results   Component Value Date    GFR >60 12/06/2018     Lab Results   Component Value Date    CREATININE 0.52 12/06/2018       Assessment/Plan:     Diabetes Mellitus type II, under poor control.  Patient Active Problem List    Diagnosis Date Noted   . Acute hypoxemic respiratory failure (CMS HCC) 12/04/2018   . Sepsis 10/27/2018   . CAP (community acquired pneumonia) 10/26/2018   . Acute exacerbation of chronic obstructive pulmonary disease (COPD) (CMS HCC) 03/27/2018   . COPD with acute exacerbation (CMS HCC) 05/23/2017   . Tobacco use disorder 05/22/2017   . Acute respiratory failure (CMS HCC) 05/21/2017   . Non-compliance 03/27/2017   . 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 Diabetes (CMS [place]) 11/18/2015   . Obesity (BMI 35.0-39.9 without comorbidity) 10/08/2012       Orders Placed This Encounter   . insulin glargine (LANTUS SOLOSTAR U-100 INSULIN) 100 unit/mL Subcutaneous Insulin Pen     Return in about 4 months (around 07/06/2019) for Recheck DM.     1.  Diabetes - A1c  IS 12.6 today, she is non compliant with diet  and still drinking sodas daily.  We discussed cutting back soda to twice per day and trying to switch to diet as a first step.  We are increasing her Lantus to 30 units daily.  She is to report glucose levels at the end of this month so we can titrate.  Will see back in four months for recheck.      Hubbard Robinson, DO 03/08/2019, 08:35

## 2019-03-20 ENCOUNTER — Other Ambulatory Visit (HOSPITAL_BASED_OUTPATIENT_CLINIC_OR_DEPARTMENT_OTHER): Payer: Self-pay | Admitting: Family Medicine

## 2019-03-20 DIAGNOSIS — J449 Chronic obstructive pulmonary disease, unspecified: Secondary | ICD-10-CM

## 2019-05-06 ENCOUNTER — Telehealth (HOSPITAL_BASED_OUTPATIENT_CLINIC_OR_DEPARTMENT_OTHER): Payer: Self-pay | Admitting: Family Medicine

## 2019-05-06 NOTE — Telephone Encounter (Signed)
Pt dropped off paperwork to be completed. I placed in your mailbox.    Pt would like [place] to fax paperwork to the number on the bottom of the page [phone] and give her a call once completed, so she can also pick up original copy.  [phone]      [employer]  05/06/2019, 09:23

## 2019-05-07 NOTE — Telephone Encounter (Signed)
Faxed completed form to number provided. Called patient and informed her that form was completed and ready to be picked up at front desk.    Maye Hides, LPN  [phone], [phone]

## 2019-06-18 ENCOUNTER — Other Ambulatory Visit: Payer: Self-pay

## 2019-06-18 ENCOUNTER — Encounter (HOSPITAL_COMMUNITY): Payer: Self-pay

## 2019-06-18 ENCOUNTER — Emergency Department (HOSPITAL_COMMUNITY): Payer: Medicaid Other

## 2019-06-18 ENCOUNTER — Inpatient Hospital Stay (HOSPITAL_COMMUNITY): Payer: Medicaid Other | Admitting: Internal Medicine

## 2019-06-18 ENCOUNTER — Inpatient Hospital Stay
Admission: EM | Admit: 2019-06-18 | Discharge: 2019-06-20 | DRG: 308 | Disposition: A | Discharge status: Home / Self Care | Payer: Medicaid Other | Attending: Internal Medicine | Admitting: Internal Medicine

## 2019-06-18 DIAGNOSIS — I4891 Unspecified atrial fibrillation: Principal | ICD-10-CM | POA: Diagnosis present

## 2019-06-18 DIAGNOSIS — J441 Chronic obstructive pulmonary disease with (acute) exacerbation: Secondary | ICD-10-CM | POA: Diagnosis present

## 2019-06-18 DIAGNOSIS — J9601 Acute respiratory failure with hypoxia: Secondary | ICD-10-CM | POA: Diagnosis present

## 2019-06-18 DIAGNOSIS — Z6832 Body mass index (BMI) 32.0-32.9, adult: Secondary | ICD-10-CM

## 2019-06-18 DIAGNOSIS — I10 Essential (primary) hypertension: Secondary | ICD-10-CM | POA: Diagnosis present

## 2019-06-18 DIAGNOSIS — R0602 Shortness of breath: Secondary | ICD-10-CM

## 2019-06-18 DIAGNOSIS — Z7951 Long term (current) use of inhaled steroids: Secondary | ICD-10-CM

## 2019-06-18 DIAGNOSIS — Z794 Long term (current) use of insulin: Secondary | ICD-10-CM

## 2019-06-18 DIAGNOSIS — E1165 Type 2 diabetes mellitus with hyperglycemia: Secondary | ICD-10-CM | POA: Diagnosis present

## 2019-06-18 DIAGNOSIS — F17210 Nicotine dependence, cigarettes, uncomplicated: Secondary | ICD-10-CM | POA: Diagnosis present

## 2019-06-18 DIAGNOSIS — Z79899 Other long term (current) drug therapy: Secondary | ICD-10-CM

## 2019-06-18 DIAGNOSIS — E785 Hyperlipidemia, unspecified: Secondary | ICD-10-CM | POA: Diagnosis present

## 2019-06-18 DIAGNOSIS — J189 Pneumonia, unspecified organism: Principal | ICD-10-CM | POA: Diagnosis present

## 2019-06-18 DIAGNOSIS — J9600 Acute respiratory failure, unspecified whether with hypoxia or hypercapnia: Secondary | ICD-10-CM | POA: Diagnosis present

## 2019-06-18 DIAGNOSIS — I447 Left bundle-branch block, unspecified: Secondary | ICD-10-CM

## 2019-06-18 DIAGNOSIS — Z9981 Dependence on supplemental oxygen: Secondary | ICD-10-CM

## 2019-06-18 DIAGNOSIS — E119 Type 2 diabetes mellitus without complications: Secondary | ICD-10-CM | POA: Diagnosis present

## 2019-06-18 DIAGNOSIS — E669 Obesity, unspecified: Secondary | ICD-10-CM | POA: Diagnosis present

## 2019-06-18 DIAGNOSIS — J440 Chronic obstructive pulmonary disease with acute lower respiratory infection: Secondary | ICD-10-CM | POA: Diagnosis present

## 2019-06-18 DIAGNOSIS — Z20822 Contact with and (suspected) exposure to covid-19: Secondary | ICD-10-CM | POA: Diagnosis present

## 2019-06-18 DIAGNOSIS — Z7982 Long term (current) use of aspirin: Secondary | ICD-10-CM

## 2019-06-18 LAB — PTT (PARTIAL THROMBOPLASTIN TIME)
APTT: 37.2 seconds — ABNORMAL HIGH (ref 25.1–36.5)
APTT: 58.8 seconds — ABNORMAL HIGH (ref 24.0–36.5)
APTT: 49.2 seconds — ABNORMAL HIGH (ref 25.1–36.5)

## 2019-06-18 LAB — POC FINGERSTICK GLUCOSE - BMC/JMC (RESULTS)
GLUCOSE, POC: 389 mg/dl — ABNORMAL HIGH (ref 60–100)
GLUCOSE, POC: 413 mg/dl (ref 60–100)
GLUCOSE, POC: 391 mg/dl — ABNORMAL HIGH (ref 60–100)

## 2019-06-18 LAB — CBC
HCT: 39.4 % (ref 36.0–45.0)
HGB: 13.3 g/dL (ref 12.0–15.5)
MCH: 30.3 pg (ref 27.5–33.2)
MCHC: 33.7 g/dL (ref 32.0–36.0)
MCV: 90 fL (ref 82.0–97.0)
MPV: 8 fL (ref 7.4–10.5)
PLATELETS: 298 10*3/uL (ref 150–450)
RBC: 4.37 10*6/uL (ref 4.00–5.10)
RDW: 13.4 % (ref 11.0–16.0)
WBC: 8.7 10*3/uL (ref 4.0–11.0)

## 2019-06-18 LAB — CBC WITH DIFF
BASOPHIL #: 0 10*3/uL (ref 0.00–0.10)
BASOPHIL %: 0 % (ref 0–3)
EOSINOPHIL #: 0 10*3/uL (ref 0.00–0.50)
EOSINOPHIL %: 0 % (ref 0–5)
HCT: 41.1 % (ref 36.0–45.0)
HGB: 13.8 g/dL (ref 12.0–15.5)
LYMPHOCYTE #: 1.2 10*3/uL (ref 1.00–4.80)
LYMPHOCYTE %: 12 % — ABNORMAL LOW (ref 15–43)
MCH: 30.3 pg (ref 27.5–33.2)
MCHC: 33.6 g/dL (ref 32.0–36.0)
MCV: 90.2 fL (ref 82.0–97.0)
MONOCYTE #: 2 10*3/uL — ABNORMAL HIGH (ref 0.20–0.90)
MONOCYTE %: 20 % — ABNORMAL HIGH (ref 5–12)
MPV: 8.2 fL (ref 7.4–10.5)
NEUTROPHIL #: 6.7 10*3/uL — ABNORMAL HIGH (ref 1.50–6.50)
NEUTROPHIL %: 68 % (ref 43–76)
PLATELETS: 320 10*3/uL (ref 150–450)
RBC: 4.55 10*6/uL (ref 4.00–5.10)
RDW: 13.4 % (ref 11.0–16.0)
WBC: 9.9 10*3/uL (ref 4.0–11.0)

## 2019-06-18 LAB — VENOUS BLOOD GAS
%FIO2 (VENOUS): 21 %
BASE EXCESS: 9 mmol/L — ABNORMAL HIGH (ref ?–2.0)
BICARBONATE (VENOUS): 31.8 mmol/L — ABNORMAL HIGH (ref 22.0–29.0)
O2 SATURATION (VENOUS): 90.6 %
PCO2 (VENOUS): 40 mm/Hg — ABNORMAL LOW (ref 41.00–51.00)
PH (VENOUS): 7.52 (ref 7.32–7.43)
PO2 (VENOUS): 53 mm/Hg

## 2019-06-18 LAB — COMPREHENSIVE METABOLIC PANEL, NON-FASTING
ALBUMIN: 3.4 g/dL (ref 3.4–4.8)
ALKALINE PHOSPHATASE: 115 U/L (ref 50–130)
ALT (SGPT): 21 U/L (ref 8–22)
ANION GAP: 12 mmol/L (ref 4–13)
AST (SGOT): 40 U/L (ref 8–45)
BILIRUBIN TOTAL: 1.5 mg/dL — ABNORMAL HIGH (ref 0.3–1.3)
BUN/CREA RATIO: 10 (ref 6–22)
BUN: 8 mg/dL (ref 8–25)
CALCIUM: 9.3 mg/dL (ref 8.8–10.2)
CHLORIDE: 89 mmol/L — ABNORMAL LOW (ref 96–111)
CO2 TOTAL: 29 mmol/L (ref 23–31)
CREATININE: 0.83 mg/dL (ref 0.60–1.05)
ESTIMATED GFR: 76 mL/min/BSA (ref >=60)
GLUCOSE: 424 mg/dL (ref 65–125)
POTASSIUM: 3.4 mmol/L — ABNORMAL LOW (ref 3.5–5.1)
PROTEIN TOTAL: 7.5 g/dL (ref 6.0–8.0)
SODIUM: 130 mmol/L — ABNORMAL LOW (ref 136–145)

## 2019-06-18 LAB — GLUCOSE, NON FASTING: GLUCOSE: 483 mg/dL (ref 65–125)

## 2019-06-18 LAB — COVID-19 ~~LOC~~ MOLECULAR LAB TESTING: SARS-CoV-2: NOT DETECTED

## 2019-06-18 LAB — MAGNESIUM: MAGNESIUM: 1.7 mg/dL — ABNORMAL LOW (ref 1.8–2.6)

## 2019-06-18 LAB — B-TYPE NATRIURETIC PEPTIDE: BNP: 87 pg/mL (ref <=99)

## 2019-06-18 LAB — D-DIMER: D-DIMER: 492 ng/mL DDU — ABNORMAL HIGH (ref <=232)

## 2019-06-18 LAB — LACTIC ACID LEVEL W/ REFLEX FOR LEVEL >2.0: LACTIC ACID: 1.8 mmol/L (ref 0.5–2.2)

## 2019-06-18 LAB — PHOSPHORUS: PHOSPHORUS: 3.2 mg/dL (ref 2.3–4.0)

## 2019-06-18 LAB — TROPONIN-I: TROPONIN I: 17 ng/L (ref 7–30)

## 2019-06-18 MED ORDER — METHYLPREDNISOLONE SOD SUCCINATE 40 MG/ML SOLUTION FOR INJ. WRAPPER
40.00 mg | Freq: Three times a day (TID) | INTRAMUSCULAR | Status: DC
Start: 2019-06-18 — End: 2019-06-19
  Administered 2019-06-18 – 2019-06-19 (×3): 40 mg via INTRAVENOUS
  Filled 2019-06-18 (×3): qty 1

## 2019-06-18 MED ORDER — IOPAMIDOL 370 MG IODINE/ML (76 %) INTRAVENOUS SOLUTION
100.00 mL | INTRAVENOUS | Status: AC
Start: 2019-06-18 — End: 2019-06-18
  Administered 2019-06-18: 09:00:00 85 mL via INTRAVENOUS
  Filled 2019-06-18: qty 100

## 2019-06-18 MED ORDER — BUDESONIDE-FORMOTEROL HFA 80 MCG-4.5 MCG/ACTUATION AEROSOL INHALER
2.00 | INHALATION_SPRAY | Freq: Two times a day (BID) | RESPIRATORY_TRACT | Status: DC
Start: 2019-06-18 — End: 2019-06-20
  Administered 2019-06-18: 0 via RESPIRATORY_TRACT
  Administered 2019-06-19 – 2019-06-20 (×3): 2 via RESPIRATORY_TRACT
  Filled 2019-06-18: qty 6.9

## 2019-06-18 MED ORDER — ATORVASTATIN 40 MG TABLET
40.00 mg | ORAL_TABLET | Freq: Every evening | ORAL | Status: DC
Start: 2019-06-18 — End: 2019-06-20
  Administered 2019-06-18 – 2019-06-19 (×2): 40 mg via ORAL
  Filled 2019-06-18 (×2): qty 1

## 2019-06-18 MED ORDER — SODIUM CHLORIDE 0.9 % (FLUSH) INJECTION SYRINGE
10.00 mL | INJECTION | Freq: Three times a day (TID) | INTRAMUSCULAR | Status: DC
Start: 2019-06-18 — End: 2019-06-20
  Administered 2019-06-18: 0 mL via INTRAVENOUS
  Administered 2019-06-18 – 2019-06-19 (×2): 10 mL via INTRAVENOUS
  Administered 2019-06-19: 0 mL via INTRAVENOUS
  Administered 2019-06-19: 10 mL via INTRAVENOUS
  Administered 2019-06-20: 0 mL via INTRAVENOUS

## 2019-06-18 MED ORDER — ONDANSETRON HCL (PF) 4 MG/2 ML INJECTION SOLUTION
4.00 mg | Freq: Three times a day (TID) | INTRAMUSCULAR | Status: DC | PRN
Start: 2019-06-18 — End: 2019-06-20

## 2019-06-18 MED ORDER — INSULIN LISPRO 100 UNIT/ML INJECTION SSIP - CITY
1.00 [IU] | Freq: Four times a day (QID) | SUBCUTANEOUS | Status: DC
Start: 2019-06-18 — End: 2019-06-20
  Administered 2019-06-18 – 2019-06-19 (×5): 12 [IU] via SUBCUTANEOUS
  Administered 2019-06-19: 10 [IU] via SUBCUTANEOUS
  Administered 2019-06-19: 12 [IU] via SUBCUTANEOUS
  Administered 2019-06-20: 10 [IU] via SUBCUTANEOUS
  Filled 2019-06-18: qty 300

## 2019-06-18 MED ORDER — IPRATROPIUM BROMIDE 0.02 % SOLUTION FOR INHALATION
0.50 mg | Freq: Four times a day (QID) | RESPIRATORY_TRACT | Status: DC
Start: 2019-06-18 — End: 2019-06-20
  Administered 2019-06-18: 0.5 mg via RESPIRATORY_TRACT
  Administered 2019-06-18: 0 mg via RESPIRATORY_TRACT
  Administered 2019-06-19 – 2019-06-20 (×6): 0.5 mg via RESPIRATORY_TRACT
  Filled 2019-06-18 (×7): qty 1

## 2019-06-18 MED ORDER — ACETAMINOPHEN 325 MG TABLET
650.00 mg | ORAL_TABLET | Freq: Four times a day (QID) | ORAL | Status: DC | PRN
Start: 2019-06-18 — End: 2019-06-20

## 2019-06-18 MED ORDER — SODIUM CHLORIDE 0.9 % INTRAVENOUS SOLUTION
500.00 mg | INTRAVENOUS | Status: DC
Start: 2019-06-18 — End: 2019-06-20
  Administered 2019-06-18: 0 mg via INTRAVENOUS
  Administered 2019-06-18 – 2019-06-19 (×2): 500 mg via INTRAVENOUS
  Administered 2019-06-19: 0 mg via INTRAVENOUS
  Filled 2019-06-18 (×4): qty 5

## 2019-06-18 MED ORDER — INSULIN LISPRO (U-100) 100 UNIT/ML SUBCUTANEOUS PEN
4.00 [IU] | PEN_INJECTOR | SUBCUTANEOUS | Status: AC
Start: 2019-06-18 — End: 2019-06-18
  Administered 2019-06-18: 4 [IU] via SUBCUTANEOUS

## 2019-06-18 MED ORDER — INSULIN GLARGINE (U-100) 100 UNIT/ML (3 ML) SUBCUTANEOUS PEN
30.00 [IU] | PEN_INJECTOR | Freq: Every evening | SUBCUTANEOUS | Status: DC
Start: 2019-06-18 — End: 2019-06-20
  Administered 2019-06-18 – 2019-06-19 (×2): 30 [IU] via SUBCUTANEOUS
  Filled 2019-06-18: qty 3

## 2019-06-18 MED ORDER — AMLODIPINE 5 MG TABLET
5.00 mg | ORAL_TABLET | Freq: Every day | ORAL | Status: DC
Start: 2019-06-19 — End: 2019-06-19
  Administered 2019-06-19: 5 mg via ORAL
  Filled 2019-06-18: qty 1

## 2019-06-18 MED ORDER — SODIUM CHLORIDE 0.9 % INTRAVENOUS SOLUTION
2.0000 g | INTRAVENOUS | Status: AC
Start: 2019-06-18 — End: 2019-06-18
  Administered 2019-06-18: 0 g via INTRAVENOUS
  Administered 2019-06-18: 2 g via INTRAVENOUS
  Filled 2019-06-18: qty 20

## 2019-06-18 MED ORDER — HEPARIN (PORCINE) 25,000 UNIT/250 ML IN 0.45 % SODIUM CHLORIDE IV SOLN
12.00 [IU]/kg/h | INTRAVENOUS | Status: DC
Start: 2019-06-18 — End: 2019-06-19
  Administered 2019-06-18: 14 [IU]/kg/h via INTRAVENOUS
  Administered 2019-06-18: 12 [IU]/kg/h via INTRAVENOUS
  Administered 2019-06-19 (×2): 16 [IU]/kg/h via INTRAVENOUS
  Filled 2019-06-18: qty 250

## 2019-06-18 MED ORDER — ALBUTEROL SULFATE CONCENTRATE 2.5 MG/0.5 ML SOLUTION FOR NEBULIZATION
5.00 mg | INHALATION_SOLUTION | Freq: Four times a day (QID) | RESPIRATORY_TRACT | Status: DC
Start: 2019-06-18 — End: 2019-06-20
  Administered 2019-06-18: 0 mg via RESPIRATORY_TRACT
  Administered 2019-06-18 – 2019-06-20 (×7): 5 mg via RESPIRATORY_TRACT
  Filled 2019-06-18 (×7): qty 2

## 2019-06-18 MED ORDER — ALBUTEROL SULFATE CONCENTRATE 2.5 MG/0.5 ML SOLUTION FOR NEBULIZATION
2.50 mg | INHALATION_SOLUTION | RESPIRATORY_TRACT | Status: AC
Start: 2019-06-18 — End: 2019-06-18
  Administered 2019-06-18: 2.5 mg via RESPIRATORY_TRACT
  Filled 2019-06-18: qty 1

## 2019-06-18 MED ORDER — FLUTICASONE PROPIONATE 50 MCG/ACTUATION NASAL SPRAY,SUSPENSION
1.00 | Freq: Two times a day (BID) | NASAL | Status: DC
Start: 2019-06-18 — End: 2019-06-20
  Administered 2019-06-18 – 2019-06-20 (×5): 0 via NASAL
  Filled 2019-06-18: qty 16

## 2019-06-18 MED ORDER — ALBUTEROL SULFATE CONCENTRATE 2.5 MG/0.5 ML SOLUTION FOR NEBULIZATION
5.00 mg | INHALATION_SOLUTION | RESPIRATORY_TRACT | Status: AC
Start: 2019-06-18 — End: 2019-06-18
  Administered 2019-06-18: 5 mg via RESPIRATORY_TRACT
  Filled 2019-06-18: qty 2

## 2019-06-18 MED ORDER — DEXTROSE 50 % IN WATER (D50W) INTRAVENOUS SYRINGE
12.50 g | INJECTION | INTRAVENOUS | Status: DC | PRN
Start: 2019-06-18 — End: 2019-06-20

## 2019-06-18 MED ORDER — ASPIRIN 81 MG CHEWABLE TABLET
81.00 mg | CHEWABLE_TABLET | Freq: Every day | ORAL | Status: DC
Start: 2019-06-19 — End: 2019-06-20
  Administered 2019-06-19 – 2019-06-20 (×2): 81 mg via ORAL
  Filled 2019-06-18 (×2): qty 1

## 2019-06-18 MED ORDER — DILTIAZEM 5 MG/ML INTRAVENOUS SOLUTION
0.2500 mg/kg | Freq: Once | INTRAVENOUS | Status: AC
Start: 2019-06-18 — End: 2019-06-18
  Administered 2019-06-18: 21 mg via INTRAVENOUS
  Filled 2019-06-18: qty 5

## 2019-06-18 MED ORDER — HEPARIN (PORCINE) 25,000 UNIT/250 ML IN 0.45 % SODIUM CHLORIDE IV SOLN
12.00 [IU]/kg/h | INTRAVENOUS | Status: AC
Start: 2019-06-18 — End: 2019-06-18
  Administered 2019-06-18: 12 [IU]/kg/h via INTRAVENOUS
  Administered 2019-06-18: 0 [IU]/kg/h via INTRAVENOUS
  Filled 2019-06-18: qty 250

## 2019-06-18 MED ORDER — DILTIAZEM HCL 125 MG/125 ML (1 MG/ML) IN 0.9 % SODIUM CHLORIDE IV
10.00 mg/h | INTRAVENOUS | Status: DC
Start: 2019-06-18 — End: 2019-06-19
  Administered 2019-06-18 (×2): 10 mg/h via INTRAVENOUS
  Filled 2019-06-18: qty 125

## 2019-06-18 MED ORDER — DILTIAZEM HCL 125 MG/125 ML (1 MG/ML) IN 0.9 % SODIUM CHLORIDE IV
5.00 mg/h | INTRAVENOUS | Status: AC
Start: 2019-06-18 — End: 2019-06-18
  Administered 2019-06-18: 10 mg/h via INTRAVENOUS
  Administered 2019-06-18: 0 mg/h via INTRAVENOUS
  Administered 2019-06-18: 5 mg/h via INTRAVENOUS
  Filled 2019-06-18: qty 125

## 2019-06-18 MED ORDER — IPRATROPIUM BROMIDE 0.02 % SOLUTION FOR INHALATION
0.50 mg | RESPIRATORY_TRACT | Status: AC
Start: 2019-06-18 — End: 2019-06-18
  Administered 2019-06-18: 0.5 mg via RESPIRATORY_TRACT
  Filled 2019-06-18: qty 1

## 2019-06-18 MED ORDER — SODIUM CHLORIDE 0.9 % (FLUSH) INJECTION SYRINGE
10.00 mL | INJECTION | INTRAMUSCULAR | Status: DC | PRN
Start: 2019-06-18 — End: 2019-06-20

## 2019-06-18 MED ORDER — INSULIN REGULAR HUMAN 100 UNIT/ML INJECTION - CHARGE BY DOSE
6.00 [IU] | INTRAMUSCULAR | Status: AC
Start: 2019-06-18 — End: 2019-06-18
  Administered 2019-06-18: 6 [IU] via SUBCUTANEOUS
  Filled 2019-06-18: qty 18

## 2019-06-18 MED ORDER — HEPARIN (PORCINE) 5,000 UNITS/ML BOLUS
60.0000 [IU]/kg | Freq: Once | INTRAMUSCULAR | Status: AC
Start: 2019-06-18 — End: 2019-06-18
  Administered 2019-06-18: 4000 [IU] via INTRAVENOUS
  Filled 2019-06-18: qty 1

## 2019-06-18 MED ORDER — SODIUM CHLORIDE 0.9% FLUSH BAG - 250 ML
INTRAVENOUS | Status: DC | PRN
Start: 2019-06-18 — End: 2019-06-20

## 2019-06-18 MED ORDER — LISINOPRIL 10 MG TABLET
5.00 mg | ORAL_TABLET | Freq: Every day | ORAL | Status: DC
Start: 2019-06-19 — End: 2019-06-20
  Administered 2019-06-19 – 2019-06-20 (×2): 5 mg via ORAL
  Filled 2019-06-18 (×2): qty 1

## 2019-06-18 MED ORDER — MAGNESIUM SULFATE 2 GRAM/50 ML (4 %) IN WATER INTRAVENOUS PIGGYBACK
2.0000 g | INJECTION | Freq: Once | INTRAVENOUS | Status: AC
Start: 2019-06-18 — End: 2019-06-18
  Administered 2019-06-18: 0 g via INTRAVENOUS
  Administered 2019-06-18: 2 g via INTRAVENOUS
  Filled 2019-06-18: qty 50

## 2019-06-18 MED ORDER — DEXAMETHASONE SODIUM PHOSPHATE 10 MG/ML INJECTION SOLUTION
6.00 mg | INTRAMUSCULAR | Status: AC
Start: 2019-06-18 — End: 2019-06-18
  Administered 2019-06-18: 6 mg via INTRAVENOUS
  Filled 2019-06-18: qty 1

## 2019-06-18 NOTE — Nurses Notes (Signed)
Arrived to floor room 607 @ about 1435. Oriented to room & call light. Vitals obtained and recorded in flow sheet. Assessment completed @ about 1500 & documented in flow sheet. Denies pain & needs @ this time. Bed low & locked, siderails up x2, call light & belongings in reach, bed alarm on, patient educated to call for assistance.

## 2019-06-18 NOTE — ED Nurses Note (Addendum)
Called to give report. RN unavailable to take report at this time.

## 2019-06-18 NOTE — ACP (Advance Care Planning) (Signed)
ac  Goals of Care  ([place]) Documentation    Purpose of Encounter: evaluationdiscussion    Parties in Attendance: Patient    Patient's Decisional Capacity (Name of surrogate if no capacity)  possesses medical decision making capacity ?  Yes    Subjective/Patient Story:  Worsening shortness of breath    Objective/Medical Story:  Patient with history of COPD, current smoker not admitted to the hospital due to acute on chronic COPD exacerbation in AFib with RVR    Goals of Care Determinations:  Discuss goals of care in detail with the patient.  We discussed about resuscitative efforts in terms of cardiorespiratory arrest.  She would like to be full code in those events.  Appropriate orders have been placed    Plan:  Patient Active Problem List   Diagnosis   . Obesity (BMI 35.0-39.9 without comorbidity)   . Diabetes (CMS [place])   . Hypertension due to endocrine disorder   . Non-compliance   . Acute respiratory failure (CMS HCC)   . Tobacco use disorder   . COPD with acute exacerbation (CMS HCC)   . Acute exacerbation of chronic obstructive pulmonary disease (COPD) (CMS HCC)   . CAP (community acquired pneumonia)   . Sepsis   . Acute hypoxemic respiratory failure (CMS HCC)   . Atrial fibrillation with rapid ventricular response (CMS HCC)     As above    Code Status:  Code Status Information     Code Status    Full Code          Other documents completed (e.g. Advance directives, POLST, MOLST):  no    Time spent discussing advance care planning (ACP) - required if submitting a charge for an ACP discussion:  20 minutes

## 2019-06-18 NOTE — ED Provider Notes (Signed)
Adelfa Koh, MD  [facility]  Emergency Department Visit Note    Date:  06/18/2019  Primary care provider:  Hubbard Robinson, DO  Means of arrival:  private car  History obtained from: patient  History limited by: none    Chief Complaint:  Shortness of breath    HISTORY OF PRESENT ILLNESS     [name], date of birth 10-Oct-1955, is a 64 y.o. female who presents to the Emergency Department complaining of shortness of breath for the last couple of days (06/16/19). The patient reports she has been wearing her oxygen 24/7 and has had a slight cough. She denies fevers, congestion, COVID vaccination, history of blood clots, or abnormal swelling in her legs. The patient states she has not had atrial fibrillation in the past. The patient attempted a breathing treatment around 05:00 this morning without relief, having found some minimal relief before now. The patient is unsure if this feels like a COPD exacerbation as she notes a history of COPD. She has had no hematuria or hematochezia.    REVIEW OF SYSTEMS     The pertinent positive and negative symptoms are as per HPI. All other systems reviewed and are negative.     PATIENT HISTORY     Past Medical History:  Past Medical History:   Diagnosis Date   . COPD (chronic obstructive pulmonary disease) (CMS HCC)    . Degenerative joint disease involving multiple joints    . Diabetes mellitus (CMS [place])    . HTN (hypertension)    . Smoker    . 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 Wears glasses      Past Surgical History:  Past Surgical History:   Procedure Laterality Date   . Hx carpal tunnel release       Family History:  Family Medical History:     Problem Relation (Age of Onset)    COPD Mother    Coronary Artery Disease Father    Diabetes Mother, Sister, Brother, Paternal Grandmother        Social History:  Social History     Tobacco Use   . Smoking status: Current Every Day Smoker     Packs/day: 1.00     Years: 20.00     Pack years: 20.00     Types:  Cigarettes   . Smokeless tobacco: Never Used   Vaping Use   . Vaping Use: Never used   Substance Use Topics   . Alcohol use: No   . Drug use: No     Social History     Substance and Sexual Activity   Drug Use No     Medications:  Current Outpatient Medications   Medication Sig   . ADVAIR DISKUS 500-50 mcg/dose Inhalation Disk with Device oral diskus inhaler INHALE 1 DOSE BY MOUTH TWICE DAILY   . albuterol (PROVENTIL) 2.5 mg/0.5 mL Inhalation Solution for Nebulization USE 1 VIAL IN NEBULIZER EVERY 4 HOURS   . albuterol sulfate (PROVENTIL) 2.5 mg /3 mL (0.083 %) Inhalation Solution for Nebulization USE 1 VIAL IN NEBULIZER 4 TIMES DAILY   . amLODIPine (NORVASC) 10 mg Oral Tablet TAKE 1 TABLET BY MOUTH ONCE DAILY   . aspirin 81 mg Oral Tablet, Chewable Take 1 Tab (81 mg total) by mouth Once a day   . atorvastatin (LIPITOR) 40 mg Oral Tablet Take 1 tablet by mouth once daily   . ATROVENT HFA 17 mcg/actuation  Inhalation HFA Aerosol Inhaler oral inhaler INHALE 2 PUFFS BY MOUTH 4 TIMES DAILY   . fluticasone furoate-vilanteroL (BREO ELLIPTA) 200-25 mcg/dose Inhalation Disk with Device Take 1 INHALATION by inhalation Once a day (Patient not taking: Reported on 03/08/2019)   . furosemide (LASIX) 20 mg Oral Tablet Take 1/2 (one-half) tablet by mouth once daily   . insulin glargine (LANTUS SOLOSTAR U-100 INSULIN) 100 unit/mL Subcutaneous Insulin Pen 30 Units by Subcutaneous route Every night   . ipratropium (ATROVENT) 0.02 % Inhalation Solution 2.5 mL (0.5 mg total) by Nebulization route ONCE EVERY 4 HOURS   . lisinopriL (PRINIVIL) 5 mg Oral Tablet Take 1 tablet by mouth once daily     Allergies:  Allergies   Allergen Reactions   . Codeine Nausea/ Vomiting     Sick to stomach   . Hydrocodone Nausea/ Vomiting   . Metformin Diarrhea   . Oxycodone Nausea/ Vomiting     PHYSICAL EXAM     Vitals:  Filed Vitals:    06/18/19 0616   BP: (!) 149/90   Pulse: (!) 131   Resp: (!) 26   Temp: 36.5 C (97.7 F)   SpO2: 91%     Pulse ox  91% on  3L Nasal Cannula interpreted by me as: Abnormal - but at baseline    General: Uncomfortable appearing in mild distress.   Eyes: Conjunctiva clear., Pupils equal and round, reactive to light and accomodation.   HENT: ENT without erythema or injection, mucous membranes moist. Nose without erythema. There is no anterior or posterior cervical chain lymphadenopathy.  Neck: Trachea is midline, no JVD  Lungs: Tachypneic with decreased breath sounds bilaterally.  Cardiovascular:  S1-S2 tachycardic rate with irregularly irregular rhythm, without murmur click gallop or rub.  Abdomen: Soft, non-tender, bowel sounds normal and no hepatosplenomegaly  Extremities: No cyanosis. Trace edema of bilateral lower extremity.  Skin: Skin warm and dry  Vascular: Normal brisk capillary refill less than 2 seconds.  Neurologic: Alert, age appropriate interactions, no evidence of meningismus, moves extremities with symmetry, nontoxic, non-ataxic.  Lymphatics: No lymphadenopathy    DIAGNOSTIC STUDIES     Labs:    Results for orders placed or performed during the hospital encounter of 06/18/19   COMPREHENSIVE METABOLIC PANEL, NON-FASTING   Result Value Ref Range    SODIUM 130 (L) 136 - 145 mmol/L    POTASSIUM 3.4 (L) 3.5 - 5.1 mmol/L    CHLORIDE 89 (L) 96 - 111 mmol/L    CO2 TOTAL 29 23 - 31 mmol/L    ANION GAP 12 4 - 13 mmol/L    BUN 8 8 - 25 mg/dL    CREATININE 0.83 0.60 - 1.05 mg/dL    BUN/CREA RATIO 10 6 - 22    ESTIMATED GFR 76 >=60 mL/min/BSA    ALBUMIN 3.4 3.4 - 4.8 g/dL     CALCIUM 9.3 8.8 - 10.2 mg/dL    GLUCOSE 424 (HH) 65 - 125 mg/dL    ALKALINE PHOSPHATASE 115 50 - 130 U/L    ALT (SGPT) 21 8 - 22 U/L    AST (SGOT)  40 8 - 45 U/L    BILIRUBIN TOTAL 1.5 (H) 0.3 - 1.3 mg/dL    PROTEIN TOTAL 7.5 6.0 - 8.0 g/dL   TROPONIN-I   Result Value Ref Range    TROPONIN I 17 7 - 30 ng/L   VENOUS BLOOD GAS   Result Value Ref Range    %FIO2 (VENOUS) 21.0 %  BICARBONATE (VENOUS) 31.8 (H) 22.0 - 29.0 mmol/L    PCO2 (VENOUS) 40.00 (L) 41.00 - 51.00  mm/Hg    PH (VENOUS) 7.52 (HH) 7.32 - 7.43    PO2 (VENOUS) 53.0 mm/Hg    BASE EXCESS 9.0 (H) -2.0 - 3.0 mmol/L    O2 SATURATION (VENOUS) 90.6 %   B-TYPE NATRIURETIC PEPTIDE   Result Value Ref Range    BNP 87 <=99 pg/mL   D-Dimer   Result Value Ref Range    D-DIMER 492 (H) <=232 ng/mL DDU   CBC WITH DIFF   Result Value Ref Range    WBC 9.9 4.0 - 11.0 x10^3/uL    RBC 4.55 4.00 - 5.10 x10^6/uL    HGB 13.8 12.0 - 15.5 g/dL    HCT 41.1 36.0 - 45.0 %    MCV 90.2 82.0 - 97.0 fL    MCH 30.3 27.5 - 33.2 pg    MCHC 33.6 32.0 - 36.0 g/dL    RDW 13.4 11.0 - 16.0 %    PLATELETS 320 150 - 450 x10^3/uL    MPV 8.2 7.4 - 10.5 fL    NEUTROPHIL % 68 43 - 76 %    LYMPHOCYTE % 12 (L) 15 - 43 %    MONOCYTE % 20 (H) 5 - 12 %    EOSINOPHIL % 0 0 - 5 %    BASOPHIL % 0 0 - 3 %    NEUTROPHIL # 6.70 (H) 1.50 - 6.50 x10^3/uL    LYMPHOCYTE # 1.20 1.00 - 4.80 x10^3/uL    MONOCYTE # 2.00 (H) 0.20 - 0.90 x10^3/uL    EOSINOPHIL # 0.00 0.00 - 0.50 x10^3/uL    BASOPHIL # 0.00 0.00 - 0.10 x10^3/uL   LACTIC ACID LEVEL W/ REFLEX FOR LEVEL >2.0   Result Value Ref Range    LACTIC ACID 1.8 0.5 - 2.2 mmol/L   COVID-19 SCREENING (COVID only)   Result Value Ref Range    SARS-CoV-2 Not Detected Not Detected   CBC   Result Value Ref Range    WBC 8.7 4.0 - 11.0 x10^3/uL    RBC 4.37 4.00 - 5.10 x10^6/uL    HGB 13.3 12.0 - 15.5 g/dL    HCT 39.4 36.0 - 45.0 %    MCV 90.0 82.0 - 97.0 fL    MCH 30.3 27.5 - 33.2 pg    MCHC 33.7 32.0 - 36.0 g/dL    RDW 13.4 11.0 - 16.0 %    PLATELETS 298 150 - 450 x10^3/uL    MPV 8.0 7.4 - 10.5 fL   PTT (PARTIAL THROMBOPLASTIN TIME)   Result Value Ref Range    APTT 37.2 (H) 25.1 - 36.5 seconds     Labs reviewed and interpreted by me.    Radiology:   CT ANGIO CHEST FOR PULMONARY EMBOLUS W IV CONTRAST   Final Result      Patchy infiltrates in both lungs.      Moderate pericardial effusion.      No evidence for PE.       Cholelithiasis.      MIPS PERFORMANCE METRICS:   1. Dose reduction technique used. Automatic exposure control (AEC) was    applied.   2. Count of high-dose radiation studies (CT and cardiac and NM) last 12   months: .       XR AP MOBILE CHEST   Final Result   No radiographic evidence for acute cardiopulmonary disease.   Emphysematous change.  CT interpreted by radiologist and report reviewed by me.  XR interpreted by radiologist and independently reviewed by me.    EKG:  12 lead EKG interpreted by me shows irregularly irregular rhythm, rate of 134 bpm, non specific ST segment changes.     ED PROGRESS NOTE / [facility] records reviewed by me:  I have reviewed the nurse's notes. I have reviewed the patient's problem list and pertinent past medical records.     Orders Placed This Encounter   . XR AP MOBILE CHEST   . CT ANGIO CHEST FOR PULMONARY EMBOLUS W IV CONTRAST   . CBC/DIFF   . COMPREHENSIVE METABOLIC PANEL, NON-FASTING   . TROPONIN-I   . VENOUS BLOOD GAS   . B-TYPE NATRIURETIC PEPTIDE   . D-Dimer   . LACTIC ACID LEVEL W/ REFLEX FOR LEVEL >2.0   . COVID-19 SCREENING (COVID only)   . CBC   . PTT (PARTIAL THROMBOPLASTIN TIME)   . ECG 12-LEAD   . AND Linked Order Group    . albuterol (PROVENTIL) 2.5mg / 0.5 mL nebulizer solution    . ipratropium (ATROVENT) 0.02% nebulizer solution   . dexamethasone 10 mg/mL injection   . magnesium sulfate 2 G in SW 50 mL premix IVPB   . insulin R human 100 units/mL injection   . albuterol (PROVENTIL) 2.5mg / 0.5 mL nebulizer solution   . dilTIAZem (CARDIZEM) 5 mg/mL injection   . dilTIAZem (CARDIZEM) 125mg  in NS 123mL (tot vol) premix infusion   . heparin 5,000 units/mL initial IV BOLUS   . heparin 25,000 units in 0.45% NS 250 mL infusion   . cefTRIAXone (ROCEPHIN) 2 g in NS 50 mL IVPB        EKG ordered.    06:29: Initial evaluation is complete at this time. Patient presents with shortness of breath, worsening over the last 2 days. She is on intermittent oxygen at home, she has needed 3 liters continuously for the last few days. On exam, she has a tachycardic heart rate with  irregular rhythm consistent with Afib with RVR. She has increased work of breathing. Differential includes COPD exacerbation, pneumonia, covid infection, ACS, PE, electrolyte abnormality, CHF exacerbation. Will plan for labs, EKG, chest xray, trial breathing treatment, steroids, and magnesium. Plan to reassess. Patient will likely require rate control for her atrial fibrillation and may require BiPAP or more invasive respiratory intervention. Patient is agreeable with the treatment plan at this time.    07:15: Elevated D-Dimer noted. CT PE ordered.    07:49: patient has hyperglycemia and will be treated with insulin.    08:11: On recheck, the patient has increased wheezing secondary to increased aeration. Will give additional breathing treatments. Will start Cardizem for rate control and initiate heparin due to CHADS2VASC score of 4. Will plan on admission to hospital pending CT PE result.    10:07: Paging hospitalist    10:16: I discussed the patient's case and above findings with Dr. Reesa Chew [facility]) who will make arrangements for admission.    MIPS      Not applicable     OPIATE PRESCRIPTION       Not applicable    CORE MEASURES      Not applicable    CRITICAL CARE TIME      Critical care time -- 32 minutes, exclusive of procedures     PRE-DISPOSITION VITALS      Pre-Disposition Vitals:  Filed Vitals:    06/18/19 1145 06/18/19  1300 06/18/19 1345 06/18/19 1400   BP: 107/73 121/70 106/65 109/66   Pulse: 100 89 94 93   Resp: (!) 27 (!) 25 17 (!) 28   Temp:       SpO2: 92% 90% 92% 91%     CLINICAL IMPRESSION     Encounter Diagnoses   Name Primary?   . Bilateral pneumonia Yes   . Shortness of breath    . Atrial fibrillation with RVR (CMS HCC)    . Atrial fibrillation with rapid ventricular response (CMS HCC)        DISPOSITION/PLAN     Admitted        Condition at Disposition: [place], SCRIBE scribed for Adelfa Koh, MD on 06/18/2019 at 6:19 AM.     Documentation  assistance provided for Adelfa Koh, MD  by Francia Greaves, [place]. Information recorded by the scribe was done at my direction and has been reviewed and validated by me Adelfa Koh, MD.

## 2019-06-18 NOTE — ED Nurses Note (Addendum)
Gave report to Ron, RN who will be taking care of pt upstairs.

## 2019-06-18 NOTE — Nurses Notes (Signed)
Dinner time [employer] 413. STAT glucose ordered. MD paged.

## 2019-06-18 NOTE — H&P (Signed)
[facility]  [place], [place] [address]    General History and Physical    Melinda, [name]  Date of Admission:  06/18/2019  Date of Birth:  04-22-1955    PCP: Melinda Robinson, DO  Chief Complaint:  Shortness of breath        HPI: [name] is a 64 y.o., White female who presents with a past medical history of COPD, diabetes, hypertension, hyperlipidemia and current smoker who presented the ED today due to shortness of breath.  Patient uses 3 L of oxygen normally at night, according to her for the past 3-4 days she has been using 3 L of oxygen continuously 24 hours, she has also been using her nebulizer with no significant improvement.  She denies any chest pain but did complain of some palpitations.  Denies any fevers or chills, according to her she has a chronic cough which is not worse than her baseline.  Endorses compliance with her medications.    On evaluation in the ED her saturation are in high 80s, currently requiring 4 L nasal cannula, she was noted to be in AFib with RVR with no previous history of AFib started on Cardizem drip with improvement.  Also noted to be severely hyperglycemic.    Assessment/Plan:     Atrial fibrillation with RVR  New diagnosis, [place] Vasc 2 score is 4.  Agree with starting with heparin drip and Cardizem drip for now.  Will transition to p.o. within 24 hours.  Will get echocardiogram    Acute on chronic COPD exacerbation.  Uses 3 L of oxygen at night.  Continue with DuoNeb nebulizer, Symbicort.  P.r.n. Solu-Medrol  Taper off oxygen to keep saturation between 88-92%.  Flonase  Start on azithromycin.    Hypertension  Continue with amlodipine and lisinopril.  Cardizem drip, will switch to p.o. metoprolol hopefully.    Uncontrolled type 2 diabetes  Pseudohyponatremia  On arrival blood sugar was 424, last HbA1c from 3 months ago available is 12%.  Will continue with Lantus 30 units nightly.  Insulin sliding scale with a.c. hs.  Will dose adjust as needed.  Will  benefit from diabetes educator    Hyperlipidemia  Continue with Lipitor    DVT prophylaxis.  Heparin drip  Code status. Full code.    Active Hospital Problems   (*Primary Problem)    Diagnosis   . *Atrial fibrillation with rapid ventricular response (CMS HCC)   . CAP (community acquired pneumonia)   . COPD with acute exacerbation (CMS HCC)   . Acute respiratory failure (CMS HCC)   . Diabetes (CMS [place])   . Obesity (BMI 35.0-39.9 without comorbidity)       Past Medical History:   Diagnosis Date   . COPD (chronic obstructive pulmonary disease) (CMS HCC)    . Degenerative joint disease involving multiple joints    . Diabetes mellitus (CMS [place])    . HTN (hypertension)    . Smoker    . 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 Wears glasses            Past Surgical History:   Procedure Laterality Date   . HX CARPAL TUNNEL RELEASE             Medications Prior to Admission     Prescriptions    ADVAIR DISKUS 500-50 mcg/dose Inhalation Disk with Device oral diskus inhaler    INHALE 1 DOSE BY  MOUTH TWICE DAILY    albuterol (PROVENTIL) 2.5 mg/0.5 mL Inhalation Solution for Nebulization    USE 1 VIAL IN NEBULIZER EVERY 4 HOURS    albuterol sulfate (PROVENTIL) 2.5 mg /3 mL (0.083 %) Inhalation Solution for Nebulization    USE 1 VIAL IN NEBULIZER 4 TIMES DAILY    amLODIPine (NORVASC) 10 mg Oral Tablet    TAKE 1 TABLET BY MOUTH ONCE DAILY    aspirin 81 mg Oral Tablet, Chewable    Take 1 Tab (81 mg total) by mouth Once a day    atorvastatin (LIPITOR) 40 mg Oral Tablet    Take 1 tablet by mouth once daily    ATROVENT HFA 17 mcg/actuation Inhalation HFA Aerosol Inhaler oral inhaler    INHALE 2 PUFFS BY MOUTH 4 TIMES DAILY    fluticasone furoate-vilanteroL (BREO ELLIPTA) 200-25 mcg/dose Inhalation Disk with Device    Take 1 INHALATION by inhalation Once a day    Patient not taking:  Reported on 03/08/2019    furosemide (LASIX) 20 mg Oral Tablet    Take 1/2 (one-half) tablet by mouth once daily    insulin glargine (LANTUS  SOLOSTAR U-100 INSULIN) 100 unit/mL Subcutaneous Insulin Pen    30 Units by Subcutaneous route Every night    ipratropium (ATROVENT) 0.02 % Inhalation Solution    2.5 mL (0.5 mg total) by Nebulization route ONCE EVERY 4 HOURS    lisinopriL (PRINIVIL) 5 mg Oral Tablet    Take 1 tablet by mouth once daily        acetaminophen (TYLENOL) tablet, 650 mg, Oral, Q6H PRN  albuterol (PROVENTIL) 2.5mg / 0.5 mL nebulizer solution, 5 mg, Nebulization, 4x/day   And  ipratropium (ATROVENT) 0.02% nebulizer solution, 0.5 mg, Nebulization, 4x/day  [START ON 06/19/2019] amLODIPine (NORVASC) tablet, 5 mg, Oral, Daily  [START ON 06/19/2019] aspirin chewable tablet 81 mg, 81 mg, Oral, Daily  atorvastatin (LIPITOR) tablet, 40 mg, Oral, QPM  budesonide-formoterol (SYMBICORT) 80 mcg-4.5 mcg per inhalation oral inhaler - "Respiratory to administer", 2 Puff, Inhalation, 2x/day  SSIP insulin lispro (HUMALOG) 100 units/mL SubQ pen, 1-12 Units, Subcutaneous, 4x/day AC   And  dextrose 50% (0.5 g/mL) injection - syringe, 12.5 g, Intravenous, Q15 Min PRN  fluticasone (FLONASE) 50 mcg per spray nasal spray, 1 Spray, Each Nostril, 2x/day  insulin glargine 100 units/mL SubQ pen, 30 Units, Subcutaneous, NIGHTLY  [START ON 06/19/2019] lisinopril (PRINIVIL) tablet, 5 mg, Oral, Daily  NS 250 mL flush bag, , Intravenous, Q1H PRN  NS flush syringe, 10 mL, Intravenous, Q8HRS  NS flush syringe, 10 mL, Intravenous, Q1H PRN  ondansetron (ZOFRAN) 2 mg/mL injection, 4 mg, Intravenous, Q8H PRN        Allergies   Allergen Reactions   . Codeine Nausea/ Vomiting     Sick to stomach   . Hydrocodone Nausea/ Vomiting   . Metformin Diarrhea   . Oxycodone Nausea/ Vomiting       Social History     Tobacco Use   . Smoking status: Current Every Day Smoker     Packs/day: 1.00     Years: 20.00     Pack years: 20.00     Types: Cigarettes   . Smokeless tobacco: Never Used   Substance Use Topics   . Alcohol use: No     Family Medical History:     Problem Relation (Age of Onset)    COPD  Mother    Coronary Artery Disease Father    Diabetes Mother, Sister, Brother, Paternal  Grandmother          ROS: Other than ROS in the HPI, all other systems were negative.    DNR Status:  Full Code    EXAM:  Temperature: 36.5 C (97.7 F)  Heart Rate: 100  BP (Non-Invasive): 107/73  Respiratory Rate: (!) 27  SpO2: 92 %  Pain Score (Numeric, Faces): 0  General:  Looks older than her age, chronically ill, acutely ill in mild distress  Eyes: Pupils equal and round, reactive to light and accomodation.   HEENT: Head atraumatic and normocephalic   Neck: No JVD or thyromegaly or lymphadenopathy   Lungs:  Decreased breath sounds bilaterally, minimal expiratory wheezing  Cardiovascular:  Irregularly irregular rhythm, S1-S2 normal, no murmur  Abdomen: Soft, non-tender, Bowel sounds normal, No hepatosplenomegaly   Extremities: extremities normal, atraumatic, no cyanosis or edema   Skin: Skin warm and dry   Neurologic: Grossly normal   Lymphatics: No lymphadenopathy   Psychiatric:  Slightly anxious        Labs:    Lab Results for Last 24 Hours:    Results for orders placed or performed during the hospital encounter of 06/18/19 (from the past 24 hour(s))   COMPREHENSIVE METABOLIC PANEL, NON-FASTING   Result Value Ref Range    SODIUM 130 (L) 136 - 145 mmol/L    POTASSIUM 3.4 (L) 3.5 - 5.1 mmol/L    CHLORIDE 89 (L) 96 - 111 mmol/L    CO2 TOTAL 29 23 - 31 mmol/L    ANION GAP 12 4 - 13 mmol/L    BUN 8 8 - 25 mg/dL    CREATININE 0.83 0.60 - 1.05 mg/dL    BUN/CREA RATIO 10 6 - 22    ESTIMATED GFR 76 >=60 mL/min/BSA    ALBUMIN 3.4 3.4 - 4.8 g/dL     CALCIUM 9.3 8.8 - 10.2 mg/dL    GLUCOSE 424 (HH) 65 - 125 mg/dL    ALKALINE PHOSPHATASE 115 50 - 130 U/L    ALT (SGPT) 21 8 - 22 U/L    AST (SGOT)  40 8 - 45 U/L    BILIRUBIN TOTAL 1.5 (H) 0.3 - 1.3 mg/dL    PROTEIN TOTAL 7.5 6.0 - 8.0 g/dL   TROPONIN-I   Result Value Ref Range    TROPONIN I 17 7 - 30 ng/L   B-TYPE NATRIURETIC PEPTIDE   Result Value Ref Range    BNP 87 <=99 pg/mL   CBC  WITH DIFF   Result Value Ref Range    WBC 9.9 4.0 - 11.0 x10^3/uL    RBC 4.55 4.00 - 5.10 x10^6/uL    HGB 13.8 12.0 - 15.5 g/dL    HCT 41.1 36.0 - 45.0 %    MCV 90.2 82.0 - 97.0 fL    MCH 30.3 27.5 - 33.2 pg    MCHC 33.6 32.0 - 36.0 g/dL    RDW 13.4 11.0 - 16.0 %    PLATELETS 320 150 - 450 x10^3/uL    MPV 8.2 7.4 - 10.5 fL    NEUTROPHIL % 68 43 - 76 %    LYMPHOCYTE % 12 (L) 15 - 43 %    MONOCYTE % 20 (H) 5 - 12 %    EOSINOPHIL % 0 0 - 5 %    BASOPHIL % 0 0 - 3 %    NEUTROPHIL # 6.70 (H) 1.50 - 6.50 x10^3/uL    LYMPHOCYTE # 1.20 1.00 - 4.80 x10^3/uL    MONOCYTE #  2.00 (H) 0.20 - 0.90 x10^3/uL    EOSINOPHIL # 0.00 0.00 - 0.50 x10^3/uL    BASOPHIL # 0.00 0.00 - 0.10 x10^3/uL   VENOUS BLOOD GAS   Result Value Ref Range    %FIO2 (VENOUS) 21.0 %    BICARBONATE (VENOUS) 31.8 (H) 22.0 - 29.0 mmol/L    PCO2 (VENOUS) 40.00 (L) 41.00 - 51.00 mm/Hg    PH (VENOUS) 7.52 (HH) 7.32 - 7.43    PO2 (VENOUS) 53.0 mm/Hg    BASE EXCESS 9.0 (H) -2.0 - 3.0 mmol/L    O2 SATURATION (VENOUS) 90.6 %   LACTIC ACID LEVEL W/ REFLEX FOR LEVEL >2.0   Result Value Ref Range    LACTIC ACID 1.8 0.5 - 2.2 mmol/L   COVID-19 SCREENING (COVID only)   Result Value Ref Range    SARS-CoV-2 Not Detected Not Detected   D-Dimer   Result Value Ref Range    D-DIMER 492 (H) <=232 ng/mL DDU   CBC   Result Value Ref Range    WBC 8.7 4.0 - 11.0 x10^3/uL    RBC 4.37 4.00 - 5.10 x10^6/uL    HGB 13.3 12.0 - 15.5 g/dL    HCT 39.4 36.0 - 45.0 %    MCV 90.0 82.0 - 97.0 fL    MCH 30.3 27.5 - 33.2 pg    MCHC 33.7 32.0 - 36.0 g/dL    RDW 13.4 11.0 - 16.0 %    PLATELETS 298 150 - 450 x10^3/uL    MPV 8.0 7.4 - 10.5 fL   PTT (PARTIAL THROMBOPLASTIN TIME)   Result Value Ref Range    APTT 37.2 (H) 25.1 - 36.5 seconds   POC FINGERSTICK GLUCOSE - [facility] (RESULTS)   Result Value Ref Range    GLUCOSE, POC 389 (H) 60 - 100 mg/dl       Imaging Studies:  Images from this admission were reviewed, were interpreted by Radiology    DVT RISK FACTORS HAVE BEN ASSESSED AND PROPHYLAXIS ORDERED  (SEE RUBYONLINE - REFERENCE TOOLS - MD, DVT PROPHY OR POCKET CARD)        Garwin Brothers, MD   Portions of this note may be dictated using voice recognition software or a dictation service. Variances in spelling and vocabulary are possible and unintentional. Not all errors are caught/corrected. Please notify the [facility] if any discrepancies are noted or if the meaning of any statement is not clear.

## 2019-06-18 NOTE — Nurses Notes (Signed)
APTT 49.2. Per non-thrombotic protocol, increase heparin drip by 2 units/kg/hr. Heparin drip increased to 14 units/kg/hr. Next aPTT is at 02:45. Teresita Madura, RN

## 2019-06-18 NOTE — ED Triage Notes (Signed)
Patient states she has been SOB for  Few days. Patient has O2 at home and usually uses it occasionally, and has had to be on 3 liters nonstop for past three days.  Patient states breathing treatments are not helping and heart rate in triage is 146.

## 2019-06-18 NOTE — ED Nurses Note (Signed)
Cardizem and heparin drip rates reviewed with Ron, RN

## 2019-06-18 NOTE — ED Nurses Note (Signed)
Despite IV Magnesium and IV Decadron, pt remains in A-Fib with RVR. New orders received for Cardizem and Heparin.

## 2019-06-18 NOTE — Nurses Notes (Signed)
Called Dr. Vernetta Honey through operator. Order received.

## 2019-06-18 NOTE — ED Nurses Note (Signed)
On return from CT, pt remains on continuous cardiac monitor, pulse ox and NIBP cuff.    Pt remains awake and alert. Airway patent. Pt remains mildly short of breath at rest. Sats 88-89% on 4L NC.     SBP 130's. HR 100's and A-Fib at this time on Cardizem gtt.

## 2019-06-18 NOTE — ED Nurses Note (Signed)
Heather (RT) advised of neb order.

## 2019-06-18 NOTE — ED Nurses Note (Signed)
Sats 86-88% on 4L NC while asleep. Order for additional Albuterol trx noted and RT advised. RR 36/min. I/E wheezes audible anterior t/o. Will reassess respiratory status following repeat Albuterol neb.

## 2019-06-18 NOTE — ED Nurses Note (Signed)
CT called will call when ready.

## 2019-06-18 NOTE — ED Nurses Note (Signed)
Dr. Patrcia Dolly at bedside for recheck.    Per Dr. Patrcia Dolly, do not need blood cultures prior to starting IV Rocephin.

## 2019-06-18 NOTE — ED Nurses Note (Signed)
Bedside report received. Pt is observed awake and alert. Speech clear, hoarseness to voice.     Airway patent. Work of breathing is mildly labored at rest. Lung sounds CTA but diminished anterior and posterior t/o.     S1/S2 irregular. A-Fib w/ RVR on monitor rate 120's to 150's. NIBP stable. Pt denies chest pain.     Pt has two IV access sites (RFA and left hand). Magnesium infusing per orders. Pt has received IV Decadron per orders.    Skin color normal. Skin temp warm and dry.    Plan for neb treatment, COVID swab results pending.

## 2019-06-19 ENCOUNTER — Inpatient Hospital Stay (HOSPITAL_COMMUNITY): Payer: Medicaid Other

## 2019-06-19 DIAGNOSIS — I313 Pericardial effusion (noninflammatory): Secondary | ICD-10-CM

## 2019-06-19 LAB — COMPREHENSIVE METABOLIC PANEL, NON-FASTING
ALBUMIN: 3.1 g/dL — ABNORMAL LOW (ref 3.4–4.8)
ALKALINE PHOSPHATASE: 100 U/L (ref 50–130)
ALT (SGPT): 19 U/L (ref 8–22)
ANION GAP: 10 mmol/L (ref 4–13)
AST (SGOT): 17 U/L (ref 8–45)
BILIRUBIN TOTAL: 0.7 mg/dL (ref 0.3–1.3)
BUN/CREA RATIO: 21 (ref 6–22)
BUN: 14 mg/dL (ref 8–25)
CALCIUM: 9.1 mg/dL (ref 8.8–10.2)
CHLORIDE: 94 mmol/L — ABNORMAL LOW (ref 96–111)
CO2 TOTAL: 30 mmol/L (ref 23–31)
CREATININE: 0.67 mg/dL (ref 0.60–1.05)
ESTIMATED GFR: >90 mL/min/BSA (ref >=60)
GLUCOSE: 297 mg/dL — ABNORMAL HIGH (ref 65–125)
POTASSIUM: 3.5 mmol/L (ref 3.5–5.1)
PROTEIN TOTAL: 7 g/dL (ref 6.0–8.0)
SODIUM: 134 mmol/L — ABNORMAL LOW (ref 136–145)

## 2019-06-19 LAB — POC FINGERSTICK GLUCOSE - BMC/JMC (RESULTS)
GLUCOSE, POC: 363 mg/dl — ABNORMAL HIGH (ref 60–100)
GLUCOSE, POC: 397 mg/dl — ABNORMAL HIGH (ref 60–100)
GLUCOSE, POC: 389 mg/dl — ABNORMAL HIGH (ref 60–100)
GLUCOSE, POC: 328 mg/dl — ABNORMAL HIGH (ref 60–100)

## 2019-06-19 LAB — CBC WITH DIFF
BASOPHIL #: 0 10*3/uL (ref 0.00–0.10)
BASOPHIL %: 1 % (ref 0–3)
EOSINOPHIL #: 0 10*3/uL (ref 0.00–0.50)
EOSINOPHIL %: 0 % (ref 0–5)
HCT: 40.2 % (ref 36.0–45.0)
HGB: 13.4 g/dL (ref 12.0–15.5)
LYMPHOCYTE #: 1 10*3/uL (ref 1.00–4.80)
LYMPHOCYTE %: 10 % — ABNORMAL LOW (ref 15–43)
MCH: 29.9 pg (ref 27.5–33.2)
MCHC: 33.4 g/dL (ref 32.0–36.0)
MCV: 89.6 fL (ref 82.0–97.0)
MONOCYTE #: 1 10*3/uL — ABNORMAL HIGH (ref 0.20–0.90)
MONOCYTE %: 9 % (ref 5–12)
MPV: 8.4 fL (ref 7.4–10.5)
NEUTROPHIL #: 8.3 10*3/uL — ABNORMAL HIGH (ref 1.50–6.50)
NEUTROPHIL %: 80 % — ABNORMAL HIGH (ref 43–76)
PLATELETS: 295 10*3/uL (ref 150–450)
RBC: 4.48 10*6/uL (ref 4.00–5.10)
RDW: 13.9 % (ref 11.0–16.0)
WBC: 10.3 10*3/uL (ref 4.0–11.0)

## 2019-06-19 LAB — ECG 12-LEAD
Atrial Rate: 153 {beats}/min
Atrial Rate: 98 {beats}/min
Calculated P Axis: 33 degrees
Calculated R Axis: 73 degrees
Calculated R Axis: 63 degrees
Calculated T Axis: 132 degrees
Calculated T Axis: 104 degrees
PR Interval: 196 ms
QRS Duration: 104 ms
QRS Duration: 114 ms
QT Interval: 320 ms
QT Interval: 388 ms
QTC Calculation: 495 ms
QTC Calculation: 495 ms
Ventricular rate: 144 {beats}/min
Ventricular rate: 98 {beats}/min

## 2019-06-19 LAB — PTT (PARTIAL THROMBOPLASTIN TIME)
APTT: 46.6 seconds — ABNORMAL HIGH (ref 24.0–36.5)
APTT: 54.5 seconds — ABNORMAL HIGH (ref 25.1–36.5)
APTT: 50.1 seconds — ABNORMAL HIGH (ref 25.1–36.5)
APTT: 36.6 seconds — ABNORMAL HIGH (ref 24.0–36.5)

## 2019-06-19 LAB — MAGNESIUM: MAGNESIUM: 2.4 mg/dL (ref 1.8–2.6)

## 2019-06-19 LAB — PHOSPHORUS: PHOSPHORUS: 3.3 mg/dL (ref 2.3–4.0)

## 2019-06-19 MED ORDER — METOPROLOL SUCCINATE ER 25 MG TABLET,EXTENDED RELEASE 24 HR
25.00 mg | ORAL_TABLET | Freq: Every day | ORAL | Status: DC
Start: 2019-06-19 — End: 2019-06-19
  Administered 2019-06-19: 25 mg via ORAL
  Filled 2019-06-19: qty 1

## 2019-06-19 MED ORDER — INSULIN GLARGINE (U-100) 100 UNIT/ML (3 ML) SUBCUTANEOUS PEN
20.00 [IU] | PEN_INJECTOR | Freq: Every day | SUBCUTANEOUS | Status: DC
Start: 2019-06-19 — End: 2019-06-20
  Administered 2019-06-19 – 2019-06-20 (×2): 20 [IU] via SUBCUTANEOUS

## 2019-06-19 MED ORDER — APIXABAN 5 MG TABLET
5.00 mg | ORAL_TABLET | Freq: Two times a day (BID) | ORAL | Status: DC
Start: 2019-06-19 — End: 2019-06-19

## 2019-06-19 MED ORDER — APIXABAN 5 MG TABLET
5.00 mg | ORAL_TABLET | Freq: Two times a day (BID) | ORAL | Status: DC
Start: 2019-06-19 — End: 2019-06-20
  Administered 2019-06-19 – 2019-06-20 (×2): 5 mg via ORAL
  Filled 2019-06-19 (×2): qty 1

## 2019-06-19 MED ORDER — METHYLPREDNISOLONE SOD SUCCINATE 40 MG/ML SOLUTION FOR INJ. WRAPPER
40.00 mg | Freq: Two times a day (BID) | INTRAMUSCULAR | Status: DC
Start: 2019-06-19 — End: 2019-06-20
  Administered 2019-06-19 – 2019-06-20 (×2): 40 mg via INTRAVENOUS
  Filled 2019-06-19 (×2): qty 1

## 2019-06-19 MED ORDER — METOPROLOL SUCCINATE ER 50 MG TABLET,EXTENDED RELEASE 24 HR
50.00 mg | ORAL_TABLET | Freq: Every day | ORAL | Status: DC
Start: 2019-06-20 — End: 2019-06-20
  Administered 2019-06-20: 50 mg via ORAL
  Filled 2019-06-19: qty 1

## 2019-06-19 MED ORDER — PERFLUTREN LIPID MICROSPHERES 1.1 MG/ML INTRAVENOUS SUSPENSION
2.00 mL | INTRAVENOUS | Status: AC
Start: 2019-06-19 — End: 2019-06-19
  Administered 2019-06-19: 2 mL via INTRAVENOUS

## 2019-06-19 NOTE — Nurses Notes (Signed)
aptt of 54.5 resulted at this time, per protocol, no rate change required. aptt ordered to be drawn at 1600. Will continue to monitor patient.

## 2019-06-19 NOTE — Nurses Notes (Signed)
APTT 46.6. Per non-thrombotic protocol, increase heparin drip by 2 units/kg/hr. Heparin drip increased to 16 units/kg/hr. Next aPTT is at 10:00. Teresita Madura, RN

## 2019-06-19 NOTE — Progress Notes (Signed)
[facility]  [place], [place] [address]    Inpatient progress note    [name], [name]  Date of Admission:  06/18/2019  Date of Birth:  [birth year]    PCP: Hubbard Robinson, DO  Chief Complaint:  Shortness of breath    Subjective :  Patient seen and examined, sitting at the edge of the bed.  Denies any acute complaints, says she is feeling significantly better.  Patient converted normal sinus rhythm this morning.  Cardizem discontinued and started on metoprolol.    Continues to be hyperglycemic follow breathing is significantly improved.  Taper steroids, will also give a standing dose Lantus this morning.    Assessment/Plan:     Atrial fibrillation with RVR  New diagnosis, [place] Vasc 2 score is 4.  Continue with heparin drip, plan to transition to Eliquis today.  Cardizem drip discontinued.  Continue with metoprolol, will increase the dose to 50 mg daily.  Echo pending    Acute on chronic COPD exacerbation.  Uses 3 L of oxygen at night.  Continue with DuoNeb nebulizer, Symbicort.  Taper Solu-Medrol  Taper off oxygen to keep saturation between 88-92%.  Flonase  Continue azithromycin    Hypertension  Discontinue amlodipine.  Continue with lisinopril.  Metoprolol as above    Uncontrolled type 2 diabetes  Pseudohyponatremia  On arrival blood sugar was 424, last HbA1c from 3 months ago available is 12%.  Will continue with Lantus 30 units nightly.  At 20 units Lantus in the morning.  Insulin sliding scale with a.c. hs.  Will dose adjust as needed.  Will benefit from diabetes educator    Hyperlipidemia  Continue with Lipitor    DVT prophylaxis.  Heparin drip  Code status. Full code.    Disposition :  Hopefully discharge within the next 24 hours.    Active Hospital Problems   (*Primary Problem)    Diagnosis   . *Atrial fibrillation with rapid ventricular response (CMS HCC)   . CAP (community acquired pneumonia)   . COPD with acute exacerbation (CMS HCC)   . Acute respiratory failure (CMS HCC)   . Diabetes  (CMS [place])   . Obesity (BMI 35.0-39.9 without comorbidity)       Past Medical History:   Diagnosis Date   . COPD (chronic obstructive pulmonary disease) (CMS HCC)    . Degenerative joint disease involving multiple joints    . Diabetes mellitus (CMS [place])    . HTN (hypertension)    . Smoker    . 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 Wears glasses            Past Surgical History:   Procedure Laterality Date   . HX CARPAL TUNNEL RELEASE             Medications Prior to Admission     Prescriptions    ADVAIR DISKUS 500-50 mcg/dose Inhalation Disk with Device oral diskus inhaler    INHALE 1 DOSE BY MOUTH TWICE DAILY    albuterol (PROVENTIL) 2.5 mg/0.5 mL Inhalation Solution for Nebulization    USE 1 VIAL IN NEBULIZER EVERY 4 HOURS    albuterol sulfate (PROVENTIL) 2.5 mg /3 mL (0.083 %) Inhalation Solution for Nebulization    USE 1 VIAL IN NEBULIZER 4 TIMES DAILY    amLODIPine (NORVASC) 10 mg Oral Tablet    TAKE 1 TABLET BY MOUTH ONCE DAILY    aspirin 81 mg Oral Tablet, Chewable  Take 1 Tab (81 mg total) by mouth Once a day    atorvastatin (LIPITOR) 40 mg Oral Tablet    Take 1 tablet by mouth once daily    ATROVENT HFA 17 mcg/actuation Inhalation HFA Aerosol Inhaler oral inhaler    INHALE 2 PUFFS BY MOUTH 4 TIMES DAILY    fluticasone furoate-vilanteroL (BREO ELLIPTA) 200-25 mcg/dose Inhalation Disk with Device    Take 1 INHALATION by inhalation Once a day    Patient not taking:  Reported on 03/08/2019    furosemide (LASIX) 20 mg Oral Tablet    Take 1/2 (one-half) tablet by mouth once daily    insulin glargine (LANTUS SOLOSTAR U-100 INSULIN) 100 unit/mL Subcutaneous Insulin Pen    30 Units by Subcutaneous route Every night    ipratropium (ATROVENT) 0.02 % Inhalation Solution    2.5 mL (0.5 mg total) by Nebulization route ONCE EVERY 4 HOURS    lisinopriL (PRINIVIL) 5 mg Oral Tablet    Take 1 tablet by mouth once daily        acetaminophen (TYLENOL) tablet, 650 mg, Oral, Q6H PRN  albuterol (PROVENTIL) 2.5mg /  0.5 mL nebulizer solution, 5 mg, Nebulization, 4x/day   And  ipratropium (ATROVENT) 0.02% nebulizer solution, 0.5 mg, Nebulization, 4x/day  aspirin chewable tablet 81 mg, 81 mg, Oral, Daily  atorvastatin (LIPITOR) tablet, 40 mg, Oral, QPM  azithromycin (ZITHROMAX) 500 mg in NS 250 mL IVPB, 500 mg, Intravenous, Q24H  budesonide-formoterol (SYMBICORT) 80 mcg-4.5 mcg per inhalation oral inhaler - "Respiratory to administer", 2 Puff, Inhalation, 2x/day  SSIP insulin lispro (HUMALOG) 100 units/mL SubQ pen, 1-12 Units, Subcutaneous, 4x/day AC   And  dextrose 50% (0.5 g/mL) injection - syringe, 12.5 g, Intravenous, Q15 Min PRN  fluticasone (FLONASE) 50 mcg per spray nasal spray, 1 Spray, Each Nostril, 2x/day  heparin 25,000 units in 0.45% NS 250 mL infusion, 12 Units/kg/hr (Adjusted), Intravenous, Continuous  insulin glargine 100 units/mL SubQ pen, 30 Units, Subcutaneous, NIGHTLY  insulin glargine 100 units/mL SubQ pen, 20 Units, Subcutaneous, Daily  lisinopril (PRINIVIL) tablet, 5 mg, Oral, Daily  methylPREDNISolone sod succ (SOLU-MEDROL) 40 mg/mL injection, 40 mg, Intravenous, Q12H  [START ON 06/20/2019] metoprolol succinate (TOPROL-XL) 24 hr extended release tablet, 50 mg, Oral, Daily  NS 250 mL flush bag, , Intravenous, Q1H PRN  NS flush syringe, 10 mL, Intravenous, Q8HRS  NS flush syringe, 10 mL, Intravenous, Q1H PRN  ondansetron (ZOFRAN) 2 mg/mL injection, 4 mg, Intravenous, Q8H PRN        Allergies   Allergen Reactions   . Codeine Nausea/ Vomiting     Sick to stomach   . Hydrocodone Nausea/ Vomiting   . Metformin Diarrhea   . Oxycodone Nausea/ Vomiting       Social History     Tobacco Use   . Smoking status: Current Every Day Smoker     Packs/day: 1.00     Years: 20.00     Pack years: 20.00     Types: Cigarettes   . Smokeless tobacco: Never Used   Substance Use Topics   . Alcohol use: No     Family Medical History:     Problem Relation (Age of Onset)    COPD Mother    Coronary Artery Disease Father    Diabetes  Mother, Sister, Brother, Paternal Grandmother          ROS: Other than ROS in the HPI, all other systems were negative.    DNR Status:  Full Code  EXAM:  Temperature: 36.7 C (98.1 F)  Heart Rate: (!) 111  BP (Non-Invasive): (!) 134/53  Respiratory Rate: 18  SpO2: 90 %  Pain Score (Numeric, Faces): 0  General:  Looks older than her age, chronically ill, acutely ill in mild distress  Eyes: Pupils equal and round, reactive to light and accomodation.   HEENT: Head atraumatic and normocephalic   Neck: No JVD or thyromegaly or lymphadenopathy   Lungs:  Decreased breath sounds bilaterally, minimal expiratory wheezing  Cardiovascular:  Irregularly irregular rhythm, S1-S2 normal, no murmur  Abdomen: Soft, non-tender, Bowel sounds normal, No hepatosplenomegaly   Extremities: extremities normal, atraumatic, no cyanosis or edema   Skin: Skin warm and dry   Neurologic: Grossly normal   Lymphatics: No lymphadenopathy   Psychiatric:  Slightly anxious        Labs:    Lab Results for Last 24 Hours:    Results for orders placed or performed during the hospital encounter of 06/18/19 (from the past 24 hour(s))   POC FINGERSTICK GLUCOSE - [facility] (RESULTS)   Result Value Ref Range    GLUCOSE, POC 389 (H) 60 - 100 mg/dl   PTT (PARTIAL THROMBOPLASTIN TIME)   Result Value Ref Range    APTT 58.8 (H) 24.0 - 36.5 seconds   POC FINGERSTICK GLUCOSE - [facility] (RESULTS)   Result Value Ref Range    GLUCOSE, POC 413 (HH) 60 - 100 mg/dl   GLUCOSE, NON FASTING   Result Value Ref Range    GLUCOSE 483 (HH) 65 - 125 mg/dL   PTT (PARTIAL THROMBOPLASTIN TIME) - ONCE   Result Value Ref Range    APTT 49.2 (H) 25.1 - 36.5 seconds   POC FINGERSTICK GLUCOSE - [facility] (RESULTS)   Result Value Ref Range    GLUCOSE, POC 391 (H) 60 - 100 mg/dl   ECG 12-LEAD   Result Value Ref Range    Ventricular rate 98 BPM    Atrial Rate 98 BPM    PR Interval 196 ms    QRS Duration 114 ms    QT Interval 388 ms    QTC Calculation 495 ms    Calculated P Axis 33 degrees     Calculated R Axis 63 degrees    Calculated T Axis 104 degrees   COMPREHENSIVE METABOLIC PANEL, NON-FASTING   Result Value Ref Range    SODIUM 134 (L) 136 - 145 mmol/L    POTASSIUM 3.5 3.5 - 5.1 mmol/L    CHLORIDE 94 (L) 96 - 111 mmol/L    CO2 TOTAL 30 23 - 31 mmol/L    ANION GAP 10 4 - 13 mmol/L    BUN 14 8 - 25 mg/dL    CREATININE 0.67 0.60 - 1.05 mg/dL    BUN/CREA RATIO 21 6 - 22    ESTIMATED GFR >90 >=60 mL/min/BSA    ALBUMIN 3.1 (L) 3.4 - 4.8 g/dL     CALCIUM 9.1 8.8 - 10.2 mg/dL    GLUCOSE 297 (H) 65 - 125 mg/dL    ALKALINE PHOSPHATASE 100 50 - 130 U/L    ALT (SGPT) 19 8 - 22 U/L    AST (SGOT)  17 8 - 45 U/L    BILIRUBIN TOTAL 0.7 0.3 - 1.3 mg/dL    PROTEIN TOTAL 7.0 6.0 - 8.0 g/dL   MAGNESIUM   Result Value Ref Range    MAGNESIUM 2.4 1.8 - 2.6 mg/dL   PHOSPHORUS   Result Value Ref Range    PHOSPHORUS  3.3 2.3 - 4.0 mg/dL   PTT (PARTIAL THROMBOPLASTIN TIME)   Result Value Ref Range    APTT 46.6 (H) 24.0 - 36.5 seconds   CBC WITH DIFF   Result Value Ref Range    WBC 10.3 4.0 - 11.0 x10^3/uL    RBC 4.48 4.00 - 5.10 x10^6/uL    HGB 13.4 12.0 - 15.5 g/dL    HCT 40.2 36.0 - 45.0 %    MCV 89.6 82.0 - 97.0 fL    MCH 29.9 27.5 - 33.2 pg    MCHC 33.4 32.0 - 36.0 g/dL    RDW 13.9 11.0 - 16.0 %    PLATELETS 295 150 - 450 x10^3/uL    MPV 8.4 7.4 - 10.5 fL    NEUTROPHIL % 80 (H) 43 - 76 %    LYMPHOCYTE % 10 (L) 15 - 43 %    MONOCYTE % 9 5 - 12 %    EOSINOPHIL % 0 0 - 5 %    BASOPHIL % 1 0 - 3 %    NEUTROPHIL # 8.30 (H) 1.50 - 6.50 x10^3/uL    LYMPHOCYTE # 1.00 1.00 - 4.80 x10^3/uL    MONOCYTE # 1.00 (H) 0.20 - 0.90 x10^3/uL    EOSINOPHIL # 0.00 0.00 - 0.50 x10^3/uL    BASOPHIL # 0.00 0.00 - 0.10 x10^3/uL   POC FINGERSTICK GLUCOSE - [facility] (RESULTS)   Result Value Ref Range    GLUCOSE, POC 363 (H) 60 - 100 mg/dl   PTT (PARTIAL THROMBOPLASTIN TIME)   Result Value Ref Range    APTT 54.5 (H) 25.1 - 36.5 seconds       Imaging Studies:  Images from this admission were reviewed, were interpreted by Radiology    DVT RISK FACTORS  HAVE BEN ASSESSED AND PROPHYLAXIS ORDERED (SEE RUBYONLINE - REFERENCE TOOLS - MD, DVT PROPHY OR POCKET CARD)        Garwin Brothers, MD   Portions of this note may be dictated using voice recognition software or a dictation service. Variances in spelling and vocabulary are possible and unintentional. Not all errors are caught/corrected. Please notify the [facility] if any discrepancies are noted or if the meaning of any statement is not clear.

## 2019-06-19 NOTE — Care Management Notes (Signed)
06/19/19 1400   Assessment Detail   Assessment Type Admission   Date of Care Management Update 06/19/19   Readmission   Is this a readmission? No   Social Work Animal nutritionist Status initial meeting   Projected Discharge Date 06/20/19   Anticipated Discharge Disposition Home   Plan achieve MMI, Home   Patient/Family in Agreement with Plan yes   Discharge Needs Assessment   Concerns To Be Addressed adjustment to diagnosis/illness concerns   Concerns Comments COPD-end stage   Readmission Within the Last 30 Days no previous admission in last 30 days   Taking blood thinner/anticoagulant therapy at home? not on anticoagulant therapy   Anticipated Changes Related to Illness inability to care for self   Equipment Currently Used at [facility] oxygen;nebulizer   Transportation Available family or friend will provide   Referral Information   Admission Type inpatient   Arrived From home or self-care   Address Verified verified-no changes   Insurance Verified verified-no change   Source of Information Patient   Referral Source admission list   Reason for Consult care coordination/care conference;discharge planning   Care Manager Assigned to Case Acie Fredrickson, RNCM     Presented to the ED with SOB & admitted to room 607 with atrial fibrillation with RVR & found to have patchy infiltrates bilateral lungs.  Pt known to have COPD, DM, HTN, & smoker.  CM met with pt at bedside 607 to discuss care management & DC planning.  Pt verified demographics, PCP, and insurance information.  Pt resides with her spouse in a 1st floor apartment with threshold entry.  Pt sleeps in a recliner.  Other that respiratory equipment, pt has no DME.  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  Pt drives.  Pt has home 02 & a nebulizer from [place].      Pt had numerous questions about atrial fibrillation which we discussed.  Pt primary nurse Benjamine Mola is going to print the pt additional information on the subject for educational purposes.  The pt is a  FULL Code.  The pt has Full Capacity to make her own decisions.  CM inquired regarding the walnut-sized raised growth in the pt right eyebrow.  Pt advised it was a birthmak.   06/19/19 1150  IP CONSULT TO CARE MANAGEMENT ([facility] - DO NOT USE FOR BEH HEALTH PATIENTS) ONE TIME CompleteDiscontinue   Process Instructions: If requesting assistance with completion of Advance Directives, please provide patient with the packet.   References: ON CALL ([place])   Provider: (Not yet assigned)   Question: Indication For Consult: Answer: MEDICATION ASSISTANCE PROGRAM Comment: Please evaluate the cost of Eliquis 5 mg b.i.d.        The pt has [employer].  The cost of Eliquis with be $3 monthly co-pay.

## 2019-06-19 NOTE — Care Plan (Signed)
Presented to the ED with SOB & admitted to room 607 with atrial fibrillation with RVR & found to have patchy infiltrates bilateral lungs.  Pt known to have COPD, DM, HTN, & smoker.  CM met with pt at bedside 607 to discuss care management & DC planning.  Pt verified demographics, PCP, and insurance information.  Pt resides with her spouse in a 1st floor apartment with threshold entry.  Pt sleeps in a recliner.  Other that respiratory equipment, pt has no DME.  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  Pt drives.  Pt has home 02 & a nebulizer from [place].      Pt had numerous questions about atrial fibrillation which we discussed.  Pt primary nurse Benjamine Mola is going to print the pt additional information on the subject for educational purposes.  The pt is a FULL Code.  The pt has Full Capacity to make her own decisions.  CM inquired regarding the walnut-sized raised growth in the pt right eyebrow.  Pt advised it was a birthmak.   06/19/19 1150  IP CONSULT TO CARE MANAGEMENT ([facility] - DO NOT USE FOR BEH HEALTH PATIENTS) ONE TIME    Complete  Discontinue   Process Instructions: If requesting assistance with completion of Advance Directives, please provide patient with the packet.   References: ON CALL ([place])   Provider: (Not yet assigned)   Question: Indication For Consult: Answer: MEDICATION ASSISTANCE PROGRAM Comment: Please evaluate the cost of Eliquis 5 mg b.i.d.        The pt has [employer].  The cost of Eliquis with be $3 monthly co-pay.

## 2019-06-19 NOTE — Care Plan (Signed)
Problem: Adult Inpatient Plan of Care  Goal: Plan of Care Review  Outcome: Ongoing (see interventions/notes)  Goal: Patient-Specific Goal (Individualized)  Outcome: Ongoing (see interventions/notes)  Goal: Absence of Hospital-Acquired Illness or Injury  Outcome: Ongoing (see interventions/notes)  Goal: Optimal Comfort and Wellbeing  Outcome: Ongoing (see interventions/notes)  Goal: Rounds/Family Conference  Outcome: Ongoing (see interventions/notes)     Problem: Fall Injury Risk  Goal: Absence of Fall and Fall-Related Injury  Outcome: Ongoing (see interventions/notes)     Problem: Depression  Goal: Improved Mood  Outcome: Ongoing (see interventions/notes)     Problem: Respiratory Compromise COPD (Chronic Obstructive Pulmonary Disease)  Goal: Effective Oxygenation and Ventilation  Outcome: Ongoing (see interventions/notes)     Problem: Dysrhythmia  Goal: Normalized Cardiac Rhythm  Outcome: Ongoing (see interventions/notes)

## 2019-06-19 NOTE — Nurses Notes (Signed)
Patient is in NSR, MD made aware. MD orders received to D/C Cardizem gtt and administer 25mg  PO metoprolol succinate daily. Heparin gtt continues to infuse at 16 units/kg/hr as ordered (See MAR). Will continue to monitor patient.

## 2019-06-19 NOTE — Nurses Notes (Signed)
Patient has converted to NSR. HR is in the 90's. BP has been stable. Cardizem drip continues. Patient has slept well and denies any needs at this time. Teresita Madura, RN

## 2019-06-20 LAB — POC FINGERSTICK GLUCOSE - BMC/JMC (RESULTS)
GLUCOSE, POC: 305 mg/dl — ABNORMAL HIGH (ref 60–100)
GLUCOSE, POC: 339 mg/dl — ABNORMAL HIGH (ref 60–100)

## 2019-06-20 MED ORDER — LANTUS SOLOSTAR U-100 INSULIN 100 UNIT/ML (3 ML) SUBCUTANEOUS PEN
30.00 [IU] | PEN_INJECTOR | Freq: Two times a day (BID) | SUBCUTANEOUS | 1 refills | Status: DC
Start: 2019-06-20 — End: 2020-03-19

## 2019-06-20 MED ORDER — PREDNISONE 20 MG TABLET
40.00 mg | ORAL_TABLET | Freq: Every day | ORAL | 0 refills | Status: AC
Start: 2019-06-21 — End: 2019-06-24

## 2019-06-20 MED ORDER — AZITHROMYCIN 500 MG TABLET
500.00 mg | ORAL_TABLET | Freq: Every day | ORAL | 0 refills | Status: AC
Start: 2019-06-21 — End: 2019-06-24

## 2019-06-20 MED ORDER — METOPROLOL SUCCINATE ER 50 MG TABLET,EXTENDED RELEASE 24 HR
50.00 mg | ORAL_TABLET | Freq: Every day | ORAL | 0 refills | Status: AC
Start: 2019-06-21 — End: 2019-07-21

## 2019-06-20 MED ORDER — APIXABAN 5 MG TABLET
5.00 mg | ORAL_TABLET | Freq: Two times a day (BID) | ORAL | 0 refills | Status: AC
Start: 2019-06-20 — End: 2019-07-20

## 2019-06-20 NOTE — Nurses Notes (Signed)
Assumed care of the patient this shift. Pt is A+Ox4. Pt has been calm and cooperative with care throughout the shift.  Pt denies any complaint of pain and sob. Pt is on 3l O2 NC. Bed in low position and locked. Call bell within reach. Will continue to monitor.

## 2019-06-20 NOTE — Nurses Notes (Incomplete)
SBAR report received from [place], Therapist, sports and walking rounds completed. Patient awake

## 2019-06-20 NOTE — Discharge Summary (Signed)
[facility]  [place], [place] [address]    DISCHARGE SUMMARY      PATIENT NAME:  [name], [name]  MRN:  [id number]  DOB:  30-Nov-1955    ADMISSION DATE:  06/18/2019  DISCHARGE DATE:  06/20/2019    ATTENDING PHYSICIAN: Garwin Brothers, MD  PRIMARY CARE PHYSICIAN: Hubbard Robinson, DO     ADMISSION DIAGNOSIS: Atrial fibrillation with rapid ventricular response (CMS HCC)  DISCHARGE DIAGNOSIS:   Active Hospital Problems    Diagnosis Date Noted   . Principle Problem: Atrial fibrillation with rapid ventricular response (CMS HCC) [I48.91] 06/18/2019   . CAP (community acquired pneumonia) [J18.9] 10/26/2018   . COPD with acute exacerbation (CMS HCC) [J44.1] 05/23/2017   . Acute respiratory failure (CMS HCC) [J96.00] 05/21/2017   . Diabetes (CMS [place]) [E11.9] 11/18/2015   . Obesity (BMI 35.0-39.9 without comorbidity) [E66.9] 10/08/2012      Resolved Hospital Problems   No resolved problems to display.     Active Non-Hospital Problems    Diagnosis Date Noted   . Acute hypoxemic respiratory failure (CMS HCC) 12/04/2018   . Sepsis 10/27/2018   . Acute exacerbation of chronic obstructive pulmonary disease (COPD) (CMS HCC) 03/27/2018   . Tobacco use disorder 05/22/2017   . Non-compliance 03/27/2017   . 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      Details   apixaban 5 mg Tablet  Commonly known as: ELIQUIS   5 mg, Oral, EVERY 12 HOURS  Qty: 60 Tablet  Refills: 0     azithromycin 500 mg Tablet  Commonly known as: ZITHROMAX  Start taking on: June 21, 2019   500 mg, Oral, DAILY  Qty: 3 Tablet  Refills: 0     metoprolol succinate 50 mg Tablet Sustained Release 24 hr  Commonly known as: TOPROL-XL  Start taking on: June 21, 2019   50 mg, Oral, DAILY  Qty: 30 Tablet  Refills: 0     predniSONE 20 mg Tablet  Commonly known as: DELTASONE  Start taking on: June 21, 2019   40 mg, Oral, DAILY  Qty: 6 Tablet  Refills: 0         CONTINUE these medications which have CHANGED during your visit.      Details   Advair Diskus 500-50 mcg/dose Disk with Device oral diskus inhaler  Generic drug: fluticasone propion-salmeteroL  What changed:    how much to take   how to take this   when to take this   INHALE 1 DOSE BY MOUTH TWICE DAILY  Qty: 60 Each  Refills: 5     * albuterol 2.5 mg/0.5 mL Solution for Nebulization  Commonly known as: PROVENTIL  What changed: See the new instructions.   USE 1 VIAL IN NEBULIZER EVERY 4 HOURS  Qty: 30 Each  Refills: 2     * albuterol sulfate 2.5 mg /3 mL (0.083 %) Solution for Nebulization  Commonly known as: PROVENTIL  What changed: See the new instructions.   USE 1 VIAL IN NEBULIZER 4 TIMES DAILY  Qty: 150 mL  Refills: 3     atorvastatin 40 mg Tablet  Commonly known as: LIPITOR  What changed: See the new instructions.   Take 1 tablet by mouth once daily  Qty: 30 Tab  Refills: 5     furosemide 20 mg Tablet  Commonly  known as: LASIX  What changed: See the new instructions.   Take 1/2 (one-half) tablet by mouth once daily  Qty: 45 Tab  Refills: 1     Lantus Solostar U-100 Insulin 100 unit/mL Insulin Pen  Generic drug: insulin glargine  What changed: when to take this   30 Units, Subcutaneous, 2 TIMES DAILY  Qty: 30 mL  Refills: 1     lisinopriL 5 mg Tablet  Commonly known as: PRINIVIL  What changed:    how much to take   how to take this   when to take this   Take 1 tablet by mouth once daily  Qty: 30 Tab  Refills: 3         * This list has 2 medication(s) that are the same as other medications prescribed for you. Read the directions carefully, and ask your doctor or other care provider to review them with you.            CONTINUE these medications - NO CHANGES were made during your visit.      Details   aspirin 81 mg Tablet, Chewable   81 mg, Oral, DAILY  Refills: 0     Breo Ellipta 200-25 mcg/dose Disk with Device  Generic drug: fluticasone furoate-vilanteroL   1 INHALATION, Inhalation, DAILY  Qty: 60 Each   Refills: 1     * ipratropium 0.02 % Solution  Commonly known as: ATROVENT   0.5 mg, Nebulization, EVERY 4 HOURS  Qty: 30 Vial  Refills: 2     * Atrovent HFA 17 mcg/actuation HFA Aerosol Inhaler oral inhaler  Generic drug: ipratropium bromide   INHALE 2 PUFFS BY MOUTH 4 TIMES DAILY  Qty: 13 Each  Refills: 5         * This list has 2 medication(s) that are the same as other medications prescribed for you. Read the directions carefully, and ask your doctor or other care provider to review them with you.            STOP taking these medications.    amLODIPine 10 mg Tablet  Commonly known as: NORVASC          DISCHARGE INSTRUCTIONS:   No discharge procedures on file.  REASON FOR HOSPITALIZATION AND HOSPITAL COURSE:  This is a 64 y.o., female with a past medical history of COPD, hypertension, uncontrolled type 2 diabetes and hyperlipidemia who had presented to the ED due to shortness of breath that was worsening for the past few days.  Patient was noted to be in AFib with RVR on arrival, she does not have any previous history.  Was started on Cardizem drip and converted to normal sinus rhythm and was also started on heparin drip.  Patient is now switched to metoprolol 50 mg daily, Eliquis 5 mg b.i.d..  Will do prednisone and antibiotics upon discharge home.  Her insulin has been increased considering this patient had uncontrolled diabetes this was discussed in detail with the patient.    Patient was noted to have moderate pericardial effusion under echocardiogram, will require follow-up, currently does not have any signs of cardiac tamponade.  She will continue with Lasix.    35 minute spent on discharge planning including coordination of care and counseling.    Physical exam :  General:  Looks older than her age, chronically ill, acutely ill in no distress  Eyes: Pupils equal and round, reactive to light and accomodation.   HEENT: Head atraumatic and normocephalic   Neck: No  JVD or thyromegaly or lymphadenopathy   Lungs:    improved breath sounds bilateral, no wheezing  Cardiovascular:  Regular rhythm, slightly tachycardic S1-S2 normal, no murmur  Abdomen: Soft, non-tender, Bowel sounds normal, No hepatosplenomegaly   Extremities: extremities normal, atraumatic, no cyanosis or edema   Skin: Skin warm and dry   Neurologic: Grossly normal   Lymphatics: No lymphadenopathy   Psychiatric:   appropriate affect      COURSE IN HOSPITAL: As above.  Please see Dr. --  H&P for admission details.    CONDITION ON DISCHARGE: Alert    DISCHARGE DISPOSITION:  Home discharge     cc: Primary Care Physician:  Hubbard Robinson, DO  [address] [place] [address]     [id number] Physician:  No referring provider defined for this encounter.   Garwin Brothers, MD

## 2019-06-20 NOTE — Pharmacy (Signed)
[facility]  Pharmacy Discharge Counseling  [name], [name], 64 y.o. female  Date of Birth:  [birth year]  Date of Admission:  06/18/2019  MRN# [id number]    Allergies   Allergen Reactions   . Codeine Nausea/ Vomiting     Sick to stomach   . Hydrocodone Nausea/ Vomiting   . Metformin Diarrhea   . Oxycodone Nausea/ Vomiting       Active Hospital Problems    Diagnosis   . Primary Problem: Atrial fibrillation with rapid ventricular response (CMS HCC)   . CAP (community acquired pneumonia)   . COPD with acute exacerbation (CMS HCC)   . Acute respiratory failure (CMS HCC)   . Diabetes (CMS [place])   . Obesity (BMI 35.0-39.9 without comorbidity)          Current Discharge Medication List      START taking these medications.      Details   apixaban 5 mg Tablet  Commonly known as: ELIQUIS   5 mg, Oral, EVERY 12 HOURS  Qty: 60 Tablet  Refills: 0     azithromycin 500 mg Tablet  Commonly known as: ZITHROMAX  Start taking on: June 21, 2019   500 mg, Oral, DAILY  Qty: 3 Tablet  Refills: 0     metoprolol succinate 50 mg Tablet Sustained Release 24 hr  Commonly known as: TOPROL-XL  Start taking on: June 21, 2019   50 mg, Oral, DAILY  Qty: 30 Tablet  Refills: 0     predniSONE 20 mg Tablet  Commonly known as: DELTASONE  Start taking on: June 21, 2019   40 mg, Oral, DAILY  Qty: 6 Tablet  Refills: 0        CONTINUE these medications which have CHANGED during your visit.      Details   Advair Diskus 500-50 mcg/dose Disk with Device oral diskus inhaler  Generic drug: fluticasone propion-salmeteroL  What changed:    how much to take   how to take this   when to take this   INHALE 1 DOSE BY MOUTH TWICE DAILY  Qty: 60 Each  Refills: 5     * albuterol 2.5 mg/0.5 mL Solution for Nebulization  Commonly known as: PROVENTIL  What changed: See the new instructions.   USE 1 VIAL IN NEBULIZER EVERY 4 HOURS  Qty: 30 Each  Refills: 2     * albuterol sulfate 2.5 mg /3 mL (0.083 %) Solution for Nebulization  Commonly known as: PROVENTIL  What  changed: See the new instructions.   USE 1 VIAL IN NEBULIZER 4 TIMES DAILY  Qty: 150 mL  Refills: 3     atorvastatin 40 mg Tablet  Commonly known as: LIPITOR  What changed: See the new instructions.   Take 1 tablet by mouth once daily  Qty: 30 Tab  Refills: 5     furosemide 20 mg Tablet  Commonly known as: LASIX  What changed: See the new instructions.   Take 1/2 (one-half) tablet by mouth once daily  Qty: 45 Tab  Refills: 1     Lantus Solostar U-100 Insulin 100 unit/mL Insulin Pen  Generic drug: insulin glargine  What changed: when to take this   30 Units, Subcutaneous, 2 TIMES DAILY  Qty: 30 mL  Refills: 1     lisinopriL 5 mg Tablet  Commonly known as: PRINIVIL  What changed:    how much to take   how to take this   when to take this  Take 1 tablet by mouth once daily  Qty: 30 Tab  Refills: 3         * This list has 2 medication(s) that are the same as other medications prescribed for you. Read the directions carefully, and ask your doctor or other care provider to review them with you.            CONTINUE these medications - NO CHANGES were made during your visit.      Details   aspirin 81 mg Tablet, Chewable   81 mg, Oral, DAILY  Refills: 0     Breo Ellipta 200-25 mcg/dose Disk with Device  Generic drug: fluticasone furoate-vilanteroL   1 INHALATION, Inhalation, DAILY  Qty: 60 Each  Refills: 1     * ipratropium 0.02 % Solution  Commonly known as: ATROVENT   0.5 mg, Nebulization, EVERY 4 HOURS  Qty: 30 Vial  Refills: 2     * Atrovent HFA 17 mcg/actuation HFA Aerosol Inhaler oral inhaler  Generic drug: ipratropium bromide   INHALE 2 PUFFS BY MOUTH 4 TIMES DAILY  Qty: 13 Each  Refills: 5         * This list has 2 medication(s) that are the same as other medications prescribed for you. Read the directions carefully, and ask your doctor or other care provider to review them with you.            STOP taking these medications.    amLODIPine 10 mg Tablet  Commonly known as: NORVASC            Medications the patient  is discharged on have been reviewed with the patient, including any drug/dose changes, frequency and timing of administration, side effects to be expected, and proper administration and compliance.      Comments: Discussed patient the new regimen for Afib - counseled on signs and symptoms of bleeds with the Eliquis (coffee ground emesis, dark tarry stools, etc) and how the Toprol may cause transient fatigue for the first few weeks of therapy.  Also counseled on signs and symptoms of hypoglycemia with the increased insulin regimen and new beta blocker (unexplained sweating associated with malaise) and how to treat if it does occur (check BS, 15 gram of simple carbs, recheck BS in 15 minutes).  Instructed patient to take azithromycin and prednisone beginning tomorrow - instructed to take both with a hearty breakfast to minimize GI upset and insomnia with taking the prednisone later in the day.     Kelvin Cellar, [place] STUDENT   06/20/2019, 13:59

## 2019-06-20 NOTE — Care Plan (Signed)
Problem: Adult Inpatient Plan of Care  Goal: Plan of Care Review  Outcome: Ongoing (see interventions/notes)  Goal: Patient-Specific Goal (Individualized)  Outcome: Ongoing (see interventions/notes)  Goal: Absence of Hospital-Acquired Illness or Injury  Outcome: Ongoing (see interventions/notes)  Goal: Optimal Comfort and Wellbeing  Outcome: Ongoing (see interventions/notes)  Goal: Rounds/Family Conference  Outcome: Ongoing (see interventions/notes)     Problem: Fall Injury Risk  Goal: Absence of Fall and Fall-Related Injury  Outcome: Ongoing (see interventions/notes)     Problem: Depression  Goal: Improved Mood  Outcome: Ongoing (see interventions/notes)     Problem: Respiratory Compromise COPD (Chronic Obstructive Pulmonary Disease)  Goal: Effective Oxygenation and Ventilation  Outcome: Ongoing (see interventions/notes)     Problem: Dysrhythmia  Goal: Normalized Cardiac Rhythm  Outcome: Ongoing (see interventions/notes)     Problem: Gas Exchange Impaired  Goal: Optimal Gas Exchange  Outcome: Ongoing (see interventions/notes)

## 2019-06-20 NOTE — Care Management Notes (Signed)
06/20/19 1600   Assessment Detail   Assessment Type Other   Date of Care Management Update 06/20/19   Social Work Plan   Discharge Planning Status discharge plan complete   Projected Discharge Date 06/20/19     Epic DC notice received.  No CM needs identified.

## 2019-06-21 ENCOUNTER — Telehealth (HOSPITAL_BASED_OUTPATIENT_CLINIC_OR_DEPARTMENT_OTHER): Payer: Self-pay

## 2019-06-21 ENCOUNTER — Other Ambulatory Visit (HOSPITAL_BASED_OUTPATIENT_CLINIC_OR_DEPARTMENT_OTHER): Payer: Self-pay | Admitting: Family Medicine

## 2019-06-21 NOTE — Telephone Encounter (Signed)
Pt called in asking if you can refill her Atrovent 0.02% inhalation solution. She stated that her COPD is acting up again.     Floria Raveling  06/21/2019, 13:45

## 2019-06-21 NOTE — Telephone Encounter (Signed)
Pt last seen 03/08/19. Pt has upcoming appt for hosp F/U on 06/26/19.  Thresa Ross, LPN  0/35/4656, 81:27

## 2019-06-21 NOTE — Telephone Encounter (Signed)
Transition of Care Contact  Discharge Date: 06/20/2019  Transition Facility Type--Hospital (Inpatient or Observation)  [facility]  Interactive Contact(s): Completed or attempted contact indicated by Date/Time  Completed Contact--06/21/2019  1:56 PM  First Attempt  Second Attempt  Third Attempt  Contact Method(s)-- Patient/Caregiver Telephone  Clinical Staff Name/Role who Tomi Bamberger, RN  Transition Assessment  Discharge Summary obtained?--Yes  How are you recovering?--Not Any Better  Discharge Meds obtained?--Yes  Medications reconcilled with Discharge Documentation?--Yes  Medication understanding --knows new medication(s)--knows purpose of medication  Medication Concerns?--No  Have everything needed for recovery?--Yes  Care Coordination:   Patient has transition follow-up appointment date and time?--Yes  Primary Care Transition Visit planned?--Yes  Specialist Transition Visit planned?--No  Patient/caregiver plans to attend transition visit?--Yes  Primary Follow-up Barrier  Interventions provided --reinforced discharge instructions--follow-up appointment date/time reinforced--med list reviewed with patient  Home Health or DME ordered at discharge?--No  Clinician/Team notified?--No  Primary reason clinician notified?  Transition Note:   TCM outreach completed.  She states she is not doing any better and feels "about the same".  She is very short of breath on the phone, but states this is normal for her and she does not feel more short of breath than usual.  She is using 3L NC at home as well.  She states her HR has been 116-126 today.  Checked progress note prior to discharge and her HR was 111.  Educated that Metoprolol will help lower her HR and BP, so it is very important to continue taking this medication and routinely checking her BP and HR at home.  Also educated her on Eliquis BID and the importance of taking that to prevent clots or stroke from A fib.  She does not  have a cardiologist at this time and will ask for a referral from Dr. Sabra Heck when she sees him next week.  She was able to pick up her antibiotic and steroid from the pharmacy as well.  She denies any chest pain at this time.  Educated her on monitoring her vitals and symptoms and reporting back to the ER if she develops any CP or worsening SOB, or if her HR becomes more elevated.  She states understanding.      Further information may be documented in relevant telephone or outreach encounter.

## 2019-06-24 MED ORDER — IPRATROPIUM BROMIDE 0.02 % SOLUTION FOR INHALATION
0.50 mg | RESPIRATORY_TRACT | 2 refills | Status: DC
Start: 2019-06-24 — End: 2019-07-31

## 2019-06-26 ENCOUNTER — Observation Stay
Admission: EM | Admit: 2019-06-26 | Discharge: 2019-06-29 | Disposition: A | Discharge status: Home / Self Care | Payer: Medicaid Other | Attending: Emergency Medicine | Admitting: Emergency Medicine

## 2019-06-26 ENCOUNTER — Encounter (HOSPITAL_COMMUNITY): Payer: Self-pay

## 2019-06-26 ENCOUNTER — Emergency Department (HOSPITAL_COMMUNITY): Payer: Medicaid Other

## 2019-06-26 ENCOUNTER — Other Ambulatory Visit: Payer: Self-pay

## 2019-06-26 ENCOUNTER — Encounter (HOSPITAL_BASED_OUTPATIENT_CLINIC_OR_DEPARTMENT_OTHER): Payer: Self-pay | Admitting: Family Medicine

## 2019-06-26 DIAGNOSIS — Z7982 Long term (current) use of aspirin: Secondary | ICD-10-CM | POA: Insufficient documentation

## 2019-06-26 DIAGNOSIS — I110 Hypertensive heart disease with heart failure: Secondary | ICD-10-CM | POA: Insufficient documentation

## 2019-06-26 DIAGNOSIS — E669 Obesity, unspecified: Secondary | ICD-10-CM | POA: Insufficient documentation

## 2019-06-26 DIAGNOSIS — I10 Essential (primary) hypertension: Secondary | ICD-10-CM

## 2019-06-26 DIAGNOSIS — I3139 Other pericardial effusion (noninflammatory): Secondary | ICD-10-CM

## 2019-06-26 DIAGNOSIS — Z794 Long term (current) use of insulin: Secondary | ICD-10-CM | POA: Insufficient documentation

## 2019-06-26 DIAGNOSIS — J449 Chronic obstructive pulmonary disease, unspecified: Secondary | ICD-10-CM

## 2019-06-26 DIAGNOSIS — I4891 Unspecified atrial fibrillation: Secondary | ICD-10-CM | POA: Insufficient documentation

## 2019-06-26 DIAGNOSIS — Z79899 Other long term (current) drug therapy: Secondary | ICD-10-CM | POA: Insufficient documentation

## 2019-06-26 DIAGNOSIS — Z825 Family history of asthma and other chronic lower respiratory diseases: Secondary | ICD-10-CM | POA: Insufficient documentation

## 2019-06-26 DIAGNOSIS — Z9981 Dependence on supplemental oxygen: Secondary | ICD-10-CM | POA: Insufficient documentation

## 2019-06-26 DIAGNOSIS — E119 Type 2 diabetes mellitus without complications: Secondary | ICD-10-CM | POA: Insufficient documentation

## 2019-06-26 DIAGNOSIS — J440 Chronic obstructive pulmonary disease with acute lower respiratory infection: Principal | ICD-10-CM | POA: Insufficient documentation

## 2019-06-26 DIAGNOSIS — R0602 Shortness of breath: Secondary | ICD-10-CM | POA: Insufficient documentation

## 2019-06-26 DIAGNOSIS — F17210 Nicotine dependence, cigarettes, uncomplicated: Secondary | ICD-10-CM | POA: Insufficient documentation

## 2019-06-26 DIAGNOSIS — M159 Polyosteoarthritis, unspecified: Secondary | ICD-10-CM | POA: Insufficient documentation

## 2019-06-26 DIAGNOSIS — Z20822 Contact with and (suspected) exposure to covid-19: Secondary | ICD-10-CM | POA: Insufficient documentation

## 2019-06-26 DIAGNOSIS — J441 Chronic obstructive pulmonary disease with (acute) exacerbation: Secondary | ICD-10-CM | POA: Insufficient documentation

## 2019-06-26 DIAGNOSIS — Z6835 Body mass index (BMI) 35.0-35.9, adult: Secondary | ICD-10-CM | POA: Insufficient documentation

## 2019-06-26 DIAGNOSIS — I5032 Chronic diastolic (congestive) heart failure: Secondary | ICD-10-CM | POA: Insufficient documentation

## 2019-06-26 DIAGNOSIS — I313 Pericardial effusion (noninflammatory): Secondary | ICD-10-CM | POA: Insufficient documentation

## 2019-06-26 DIAGNOSIS — F17200 Nicotine dependence, unspecified, uncomplicated: Secondary | ICD-10-CM

## 2019-06-26 DIAGNOSIS — J189 Pneumonia, unspecified organism: Secondary | ICD-10-CM | POA: Insufficient documentation

## 2019-06-26 DIAGNOSIS — Z7951 Long term (current) use of inhaled steroids: Secondary | ICD-10-CM | POA: Insufficient documentation

## 2019-06-26 DIAGNOSIS — Z7901 Long term (current) use of anticoagulants: Secondary | ICD-10-CM | POA: Insufficient documentation

## 2019-06-26 LAB — COMPREHENSIVE METABOLIC PANEL, NON-FASTING
ALBUMIN: 3.4 g/dL (ref 3.4–4.8)
ALKALINE PHOSPHATASE: 131 U/L — ABNORMAL HIGH (ref 50–130)
ALT (SGPT): 37 U/L — ABNORMAL HIGH (ref 8–22)
ANION GAP: 7 mmol/L (ref 4–13)
AST (SGOT): 18 U/L (ref 8–45)
BILIRUBIN TOTAL: 0.9 mg/dL (ref 0.3–1.3)
BUN/CREA RATIO: 14 (ref 6–22)
BUN: 10 mg/dL (ref 8–25)
CALCIUM: 9.5 mg/dL (ref 8.8–10.2)
CHLORIDE: 90 mmol/L — ABNORMAL LOW (ref 96–111)
CO2 TOTAL: 34 mmol/L — ABNORMAL HIGH (ref 23–31)
CREATININE: 0.69 mg/dL (ref 0.60–1.05)
ESTIMATED GFR: >90 mL/min/BSA (ref >=60)
GLUCOSE: 236 mg/dL — ABNORMAL HIGH (ref 65–125)
POTASSIUM: 3.6 mmol/L (ref 3.5–5.1)
PROTEIN TOTAL: 7.5 g/dL (ref 6.0–8.0)
SODIUM: 131 mmol/L — ABNORMAL LOW (ref 136–145)

## 2019-06-26 LAB — CBC WITH DIFF
HCT: 42.6 % (ref 36.0–45.0)
HGB: 14.1 g/dL (ref 12.0–15.5)
MCH: 29.7 pg (ref 27.5–33.2)
MCHC: 33 g/dL (ref 32.0–36.0)
MCV: 90 fL (ref 82.0–97.0)
MPV: 8.2 fL (ref 7.4–10.5)
PLATELETS: 521 10*3/uL — ABNORMAL HIGH (ref 150–450)
RBC: 4.74 10*6/uL (ref 4.00–5.10)
RDW: 14.1 % (ref 11.0–16.0)
WBC: 19.7 10*3/uL — ABNORMAL HIGH (ref 4.0–11.0)

## 2019-06-26 LAB — ECG 12-LEAD
Atrial Rate: 131 {beats}/min
Calculated P Axis: 55 degrees
Calculated R Axis: 69 degrees
Calculated T Axis: 142 degrees
PR Interval: 224 ms
QRS Duration: 98 ms
QT Interval: 228 ms
QTC Calculation: 335 ms
Ventricular rate: 130 {beats}/min

## 2019-06-26 LAB — MANUAL DIFFERENTIAL
BAND %: 1 % (ref 0–4)
BASOPHIL %: 1 % (ref 0–3)
BASOPHIL ABSOLUTE: 0.2 10*3/uL — ABNORMAL HIGH (ref 0.00–0.10)
EOSINOPHIL %: 1 % (ref 0–5)
EOSINOPHIL ABSOLUTE: 0.2 10*3/uL (ref 0.00–0.50)
LYMPHOCYTE %: 25 % (ref 15–43)
LYMPHOCYTE ABSOLUTE: 4.93 10*3/uL — ABNORMAL HIGH (ref 1.00–4.80)
MONOCYTE %: 15 % — ABNORMAL HIGH (ref 5–12)
MONOCYTE ABSOLUTE: 2.96 10*3/uL — ABNORMAL HIGH (ref 0.20–0.90)
NEUTROPHIL %: 57 % (ref 43–76)
NEUTROPHIL ABSOLUTE: 11.43 10*3/uL — ABNORMAL HIGH (ref 1.50–6.50)
PLATELET ESTIMATE: INCREASED
RBC MORPHOLOGY COMMENT: NORMAL
WBC MORPHOLOGY COMMENT: NORMAL
WBC: 19.7 10*3/uL

## 2019-06-26 LAB — TROPONIN-I: TROPONIN I: <7 ng/L — ABNORMAL LOW (ref 7–30)

## 2019-06-26 LAB — COVID-19 ~~LOC~~ MOLECULAR LAB TESTING: SARS-CoV-2: NOT DETECTED

## 2019-06-26 LAB — B-TYPE NATRIURETIC PEPTIDE: BNP: 66 pg/mL (ref <=99)

## 2019-06-26 LAB — PROCALCITONIN: PROCALCITONIN: <0.05 ng/mL (ref <0.50)

## 2019-06-26 LAB — POC FINGERSTICK GLUCOSE - BMC/JMC (RESULTS)
GLUCOSE, POC: 187 mg/dl — ABNORMAL HIGH (ref 60–100)
GLUCOSE, POC: 336 mg/dl — ABNORMAL HIGH (ref 60–100)
GLUCOSE, POC: 388 mg/dl — ABNORMAL HIGH (ref 60–100)
GLUCOSE, POC: 285 mg/dl — ABNORMAL HIGH (ref 60–100)

## 2019-06-26 LAB — THYROID STIMULATING HORMONE WITH FREE T4 REFLEX: TSH: 0.361 u[IU]/mL — ABNORMAL LOW (ref 0.430–3.550)

## 2019-06-26 LAB — C-REACTIVE PROTEIN(CRP),INFLAMMATION: CRP INFLAMMATION: 60.9 mg/L — ABNORMAL HIGH (ref <8.0)

## 2019-06-26 LAB — THYROXINE, FREE (FREE T4): THYROXINE (T4), FREE: 1.18 ng/dL (ref 0.70–1.25)

## 2019-06-26 MED ORDER — SODIUM CHLORIDE 0.9% FLUSH BAG - 250 ML
INTRAVENOUS | Status: DC | PRN
Start: 2019-06-26 — End: 2019-06-29

## 2019-06-26 MED ORDER — INSULIN GLARGINE (U-100) 100 UNIT/ML (3 ML) SUBCUTANEOUS PEN
30.00 [IU] | PEN_INJECTOR | Freq: Two times a day (BID) | SUBCUTANEOUS | Status: AC
Start: 2019-06-26 — End: ?
  Administered 2019-06-26 – 2019-06-27 (×3): 30 [IU] via SUBCUTANEOUS
  Filled 2019-06-26: qty 3

## 2019-06-26 MED ORDER — IPRATROPIUM BROMIDE 0.02 % SOLUTION FOR INHALATION
0.50 mg | Freq: Four times a day (QID) | RESPIRATORY_TRACT | Status: DC
Start: 2019-06-26 — End: 2019-06-29
  Administered 2019-06-26 – 2019-06-29 (×13): 0.5 mg via RESPIRATORY_TRACT
  Filled 2019-06-26 (×12): qty 1

## 2019-06-26 MED ORDER — ALBUTEROL SULFATE CONCENTRATE 2.5 MG/0.5 ML SOLUTION FOR NEBULIZATION
2.50 mg | INHALATION_SOLUTION | RESPIRATORY_TRACT | Status: DC | PRN
Start: 2019-06-26 — End: 2019-06-29
  Filled 2019-06-26 (×2): qty 1

## 2019-06-26 MED ORDER — PROCHLORPERAZINE EDISYLATE 10 MG/2 ML (5 MG/ML) INJECTION SOLUTION
10.00 mg | Freq: Four times a day (QID) | INTRAMUSCULAR | Status: DC | PRN
Start: 2019-06-26 — End: 2019-06-29
  Administered 2019-06-28: 10 mg via INTRAVENOUS

## 2019-06-26 MED ORDER — DILTIAZEM HCL 125 MG/125 ML (1 MG/ML) IN 0.9 % SODIUM CHLORIDE IV
5.00 mg/h | INTRAVENOUS | Status: AC
Start: 2019-06-26 — End: 2019-06-26
  Administered 2019-06-26: 5 mg/h via INTRAVENOUS

## 2019-06-26 MED ORDER — INSULIN LISPRO 100 UNIT/ML INJECTION SSIP - CITY
1.00 [IU] | Freq: Four times a day (QID) | SUBCUTANEOUS | Status: DC
Start: 2019-06-26 — End: 2019-06-29
  Administered 2019-06-26: 12 [IU] via SUBCUTANEOUS
  Administered 2019-06-26: 7 [IU] via SUBCUTANEOUS
  Administered 2019-06-26: 2 [IU] via SUBCUTANEOUS
  Administered 2019-06-26 – 2019-06-27 (×2): 10 [IU] via SUBCUTANEOUS
  Administered 2019-06-27: 7 [IU] via SUBCUTANEOUS
  Administered 2019-06-27: 10 [IU] via SUBCUTANEOUS
  Administered 2019-06-27 – 2019-06-28 (×2): 7 [IU] via SUBCUTANEOUS
  Administered 2019-06-28 (×2): 0 [IU] via SUBCUTANEOUS
  Administered 2019-06-28: 4 [IU] via SUBCUTANEOUS
  Administered 2019-06-29: 0 [IU] via SUBCUTANEOUS
  Filled 2019-06-26: qty 300

## 2019-06-26 MED ORDER — SODIUM CHLORIDE 0.9 % INTRAVENOUS PIGGYBACK
2.0000 g | INTRAVENOUS | Status: DC
Start: 2019-06-26 — End: 2019-06-27
  Administered 2019-06-26: 0 g via INTRAVENOUS
  Administered 2019-06-26 – 2019-06-27 (×2): 2 g via INTRAVENOUS
  Administered 2019-06-27: 0 g via INTRAVENOUS
  Filled 2019-06-26 (×3): qty 20

## 2019-06-26 MED ORDER — NITROGLYCERIN 0.4 MG SUBLINGUAL TABLET
0.4000 mg | SUBLINGUAL_TABLET | SUBLINGUAL | Status: DC | PRN
Start: 2019-06-26 — End: 2019-06-29

## 2019-06-26 MED ORDER — DEXTROSE 50 % IN WATER (D50W) INTRAVENOUS SYRINGE
12.50 g | INJECTION | INTRAVENOUS | Status: DC | PRN
Start: 2019-06-26 — End: 2019-06-29

## 2019-06-26 MED ORDER — DEXAMETHASONE SODIUM PHOSPHATE 10 MG/ML INJECTION SOLUTION
10.00 mg | INTRAMUSCULAR | Status: AC
Start: 2019-06-26 — End: 2019-06-26
  Administered 2019-06-26: 10 mg via INTRAVENOUS
  Filled 2019-06-26: qty 1

## 2019-06-26 MED ORDER — ALBUTEROL SULFATE CONCENTRATE 2.5 MG/0.5 ML SOLUTION FOR NEBULIZATION
2.50 mg | INHALATION_SOLUTION | Freq: Four times a day (QID) | RESPIRATORY_TRACT | Status: DC
Start: 2019-06-26 — End: 2019-06-29
  Administered 2019-06-26 – 2019-06-29 (×13): 2.5 mg via RESPIRATORY_TRACT
  Filled 2019-06-26 (×10): qty 1

## 2019-06-26 MED ORDER — APIXABAN 5 MG TABLET
5.00 mg | ORAL_TABLET | Freq: Two times a day (BID) | ORAL | Status: DC
Start: 2019-06-26 — End: 2019-06-29
  Administered 2019-06-26 – 2019-06-28 (×6): 5 mg via ORAL
  Administered 2019-06-29: 0 mg via ORAL
  Filled 2019-06-26 (×6): qty 1

## 2019-06-26 MED ORDER — ATORVASTATIN 40 MG TABLET
40.00 mg | ORAL_TABLET | Freq: Every evening | ORAL | Status: DC
Start: 2019-06-26 — End: 2019-06-29
  Administered 2019-06-26 – 2019-06-28 (×3): 40 mg via ORAL
  Filled 2019-06-26 (×3): qty 1

## 2019-06-26 MED ORDER — MAGNESIUM SULFATE 2 GRAM/50 ML (4 %) IN WATER INTRAVENOUS PIGGYBACK
2.00 g | INJECTION | INTRAVENOUS | Status: AC
Start: 2019-06-26 — End: 2019-06-26
  Administered 2019-06-26: 2 g via INTRAVENOUS
  Administered 2019-06-26: 0 g via INTRAVENOUS
  Filled 2019-06-26: qty 50

## 2019-06-26 MED ORDER — ARFORMOTEROL 15 MCG/2 ML SOLUTION FOR NEBULIZATION
15.00 ug | INHALATION_SOLUTION | Freq: Two times a day (BID) | RESPIRATORY_TRACT | Status: DC
Start: 2019-06-26 — End: 2019-06-29
  Administered 2019-06-26 – 2019-06-29 (×7): 15 ug via RESPIRATORY_TRACT
  Filled 2019-06-26 (×7): qty 1

## 2019-06-26 MED ORDER — MORPHINE 4 MG/ML INTRAVENOUS SOLUTION
4.00 mg | INTRAVENOUS | Status: DC | PRN
Start: 2019-06-26 — End: 2019-06-29

## 2019-06-26 MED ORDER — SODIUM CHLORIDE 0.9 % (FLUSH) INJECTION SYRINGE
10.00 mL | INJECTION | Freq: Three times a day (TID) | INTRAMUSCULAR | Status: DC
Start: 2019-06-26 — End: 2019-06-29
  Administered 2019-06-26: 0 mL via INTRAVENOUS
  Administered 2019-06-26 (×2): 10 mL via INTRAVENOUS
  Administered 2019-06-27 – 2019-06-29 (×7): 0 mL via INTRAVENOUS

## 2019-06-26 MED ORDER — DILTIAZEM 5 MG/ML INTRAVENOUS SOLUTION
0.2500 mg/kg | Freq: Once | INTRAVENOUS | Status: AC
Start: 2019-06-26 — End: 2019-06-26
  Administered 2019-06-26: 22 mg via INTRAVENOUS
  Filled 2019-06-26: qty 5

## 2019-06-26 MED ORDER — ALBUTEROL SULFATE CONCENTRATE 2.5 MG/0.5 ML SOLUTION FOR NEBULIZATION
2.50 mg | INHALATION_SOLUTION | RESPIRATORY_TRACT | Status: AC
Start: 2019-06-26 — End: 2019-06-26
  Administered 2019-06-26: 2.5 mg via RESPIRATORY_TRACT
  Filled 2019-06-26: qty 1

## 2019-06-26 MED ORDER — METHYLPREDNISOLONE SOD SUCCINATE 40 MG/ML SOLUTION FOR INJ. WRAPPER
40.00 mg | Freq: Two times a day (BID) | INTRAMUSCULAR | Status: DC
Start: 2019-06-26 — End: 2019-06-27
  Administered 2019-06-26 – 2019-06-27 (×3): 40 mg via INTRAVENOUS
  Filled 2019-06-26 (×3): qty 1

## 2019-06-26 MED ORDER — POTASSIUM CHLORIDE ER 20 MEQ TABLET,EXTENDED RELEASE(PART/CRYST)
40.00 meq | ORAL_TABLET | ORAL | Status: AC
Start: 2019-06-26 — End: 2019-06-26
  Administered 2019-06-26: 40 meq via ORAL
  Filled 2019-06-26: qty 2

## 2019-06-26 MED ORDER — SODIUM CHLORIDE 0.9 % (FLUSH) INJECTION SYRINGE
10.00 mL | INJECTION | INTRAMUSCULAR | Status: DC | PRN
Start: 2019-06-26 — End: 2019-06-29

## 2019-06-26 MED ORDER — ASPIRIN 81 MG CHEWABLE TABLET
81.00 mg | CHEWABLE_TABLET | Freq: Every day | ORAL | Status: DC
Start: 2019-06-26 — End: 2019-06-29
  Administered 2019-06-26 – 2019-06-28 (×3): 81 mg via ORAL
  Administered 2019-06-29: 0 mg via ORAL
  Filled 2019-06-26 (×3): qty 1

## 2019-06-26 MED ORDER — FUROSEMIDE 20 MG TABLET
20.00 mg | ORAL_TABLET | Freq: Every day | ORAL | Status: DC
Start: 2019-06-26 — End: 2019-06-29
  Administered 2019-06-26 – 2019-06-28 (×3): 20 mg via ORAL
  Administered 2019-06-29: 0 mg via ORAL
  Filled 2019-06-26 (×3): qty 1

## 2019-06-26 MED ORDER — ACETAMINOPHEN 325 MG TABLET
650.00 mg | ORAL_TABLET | Freq: Four times a day (QID) | ORAL | Status: DC | PRN
Start: 2019-06-26 — End: 2019-06-29

## 2019-06-26 MED ORDER — IPRATROPIUM BROMIDE 0.02 % SOLUTION FOR INHALATION
0.50 mg | RESPIRATORY_TRACT | Status: AC
Start: 2019-06-26 — End: 2019-06-26
  Administered 2019-06-26: 0.5 mg via RESPIRATORY_TRACT
  Filled 2019-06-26: qty 1

## 2019-06-26 MED ORDER — METOPROLOL SUCCINATE ER 25 MG TABLET,EXTENDED RELEASE 24 HR
75.00 mg | ORAL_TABLET | Freq: Every day | ORAL | Status: DC
Start: 2019-06-26 — End: 2019-06-29
  Administered 2019-06-26 – 2019-06-28 (×3): 75 mg via ORAL
  Administered 2019-06-29: 0 mg via ORAL
  Filled 2019-06-26 (×3): qty 1

## 2019-06-26 MED ORDER — GUAIFENESIN ER 600 MG TABLET, EXTENDED RELEASE 12 HR
600.00 mg | EXTENDED_RELEASE_TABLET | Freq: Two times a day (BID) | ORAL | Status: DC
Start: 2019-06-26 — End: 2019-06-29
  Administered 2019-06-26 – 2019-06-28 (×6): 600 mg via ORAL
  Administered 2019-06-29: 0 mg via ORAL
  Filled 2019-06-26 (×6): qty 1

## 2019-06-26 MED ORDER — BUDESONIDE 0.5 MG/2 ML SUSPENSION FOR NEBULIZATION
0.5000 mg | INHALATION_SUSPENSION | Freq: Two times a day (BID) | RESPIRATORY_TRACT | Status: DC
Start: 2019-06-26 — End: 2019-06-29
  Administered 2019-06-26 – 2019-06-29 (×7): 0.5 mg via RESPIRATORY_TRACT
  Filled 2019-06-26 (×6): qty 2

## 2019-06-26 MED ORDER — DILTIAZEM HCL 125 MG/125 ML (1 MG/ML) IN 0.9 % SODIUM CHLORIDE IV
10.00 mg/h | INTRAVENOUS | Status: DC
Start: 2019-06-26 — End: 2019-06-26
  Administered 2019-06-26: 10 mg/h via INTRAVENOUS
  Filled 2019-06-26 (×2): qty 125

## 2019-06-26 MED ORDER — LISINOPRIL 5 MG TABLET
5.00 mg | ORAL_TABLET | Freq: Every day | ORAL | Status: DC
Start: 2019-06-26 — End: 2019-06-29
  Administered 2019-06-26 – 2019-06-28 (×3): 5 mg via ORAL
  Administered 2019-06-29: 0 mg via ORAL
  Filled 2019-06-26 (×4): qty 1

## 2019-06-26 NOTE — ED Nurses Note (Signed)
Report called to [employer] Belongings packed and transported with patient. On [employer] monitor

## 2019-06-26 NOTE — ED Nurses Note (Signed)
Med FNP notified and made aware of EKG changes. Received VO to hold Cardizem gtt at this time

## 2019-06-26 NOTE — Consults (Signed)
[facility]  Consultation Note    Date Time: 06/26/2019 12:53  Patient Name: [name]  MRN#: [id number]  DOB: [birth year]  Consulting Physician: Evalee Jefferson, MD  Length of stay: 0 days    Reason for Consult/Chief complaint:   The patient was seen at the request of Evalee Jefferson, MD for the evaluation of Afib RVR    HPI:   [name] is a 64 y.o. woman with PMH of COPD, DJD, DM, HTN, Smoker, admitted with Afib RVR  Was recently diagnosed with Afib and bilateral PNA. She was discharged on PO abx and Eliquis  She has been ill since and reports having shortness of breath and palpitations  In the ER, noted to be in Afib RVR  Started on a Cardizem and heparin gtt    Currently on Eliquis BID and Toprol XL 75mg  po daily  ROS is negative for all systems reviewed except as per HPI  Current smoker, no alcohol or IVDA  No FH of premature heart disease  EKG shows Afib   Telemetry shows NSR, HR 99    Past Medical History:     Past Medical History:   Diagnosis Date   . COPD (chronic obstructive pulmonary disease) (CMS HCC)    . Degenerative joint disease involving multiple joints    . Diabetes mellitus (CMS [place])    . HTN (hypertension)    . Smoker    . 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 Wears glasses          Past Surgical History:     Past Surgical History:   Procedure Laterality Date   . HX CARPAL TUNNEL RELEASE           Allergies:     Allergies   Allergen Reactions   . Codeine Nausea/ Vomiting     Sick to stomach   . Hydrocodone Nausea/ Vomiting   . Metformin Diarrhea   . Oxycodone Nausea/ Vomiting     Outpatient Medications:     Prior to Admission Medications   Prescriptions Last Dose Informant Patient Reported? Taking?   ADVAIR DISKUS 500-50 mcg/dose Inhalation Disk with Device oral diskus inhaler 06/25/2019 at 2100 Patient No Yes   Sig: INHALE 1 DOSE BY MOUTH TWICE DAILY   Patient taking differently: Take 1 INHALATION by inhalation Twice daily INHALE 1 DOSE BY MOUTH TWICE DAILY    ATROVENT HFA 17 mcg/actuation Inhalation HFA Aerosol Inhaler oral inhaler 06/25/2019 at 2100  No Yes   Sig: INHALE 2 PUFFS BY MOUTH 4 TIMES DAILY   albuterol (PROVENTIL) 2.5 mg/0.5 mL Inhalation Solution for Nebulization 06/26/2019 at 0015 Patient No Yes   Sig: USE 1 VIAL IN NEBULIZER EVERY 4 HOURS   Patient taking differently: 2.5 mg by Nebulization route Every 4 hours as needed    albuterol sulfate (PROVENTIL) 2.5 mg /3 mL (0.083 %) Inhalation Solution for Nebulization 06/26/2019 at 0015 Patient No Yes   Sig: USE 1 VIAL IN NEBULIZER 4 TIMES DAILY   Patient taking differently: 2.5 mg by Nebulization route Every 4 hours as needed    apixaban (ELIQUIS) 5 mg Oral Tablet 06/25/2019 at 2100  No Yes   Sig: Take 1 Tablet (5 mg total) by mouth Every 12 hours for 30 days   aspirin 81 mg Oral Tablet, Chewable Past Week at Unknown time Patient No No   Sig: Take 1 Tab (81 mg total) by mouth Once  a day   atorvastatin (LIPITOR) 40 mg Oral Tablet Past Week at Unknown time Patient No No   Sig: Take 1 tablet by mouth once daily   Patient taking differently: Take 40 mg by mouth Every evening    fluticasone furoate-vilanteroL (BREO ELLIPTA) 200-25 mcg/dose Inhalation Disk with Device Unknown at Unknown time  No No   Sig: Take 1 INHALATION by inhalation Once a day   Patient not taking: Reported on 03/08/2019   furosemide (LASIX) 20 mg Oral Tablet Past Week at Unknown time Patient No No   Sig: Take 1/2 (one-half) tablet by mouth once daily   Patient taking differently: Take 20 mg by mouth Once a day    insulin glargine (LANTUS SOLOSTAR U-100 INSULIN) 100 unit/mL Subcutaneous Insulin Pen 06/25/2019 at 2100  No Yes   Sig: 30 Units by Subcutaneous route Twice daily   ipratropium (ATROVENT) 0.02 % Inhalation Solution 06/26/2019 at 0015  No Yes   Sig: 2.5 mL (0.5 mg total) by Nebulization route ONCE EVERY 4 HOURS   lisinopriL (PRINIVIL) 5 mg Oral Tablet Past Week at Unknown time Patient No No   Sig: Take 1 tablet by mouth once daily   Patient  taking differently: Take 5 mg by mouth Once a day Take 1 tablet by mouth once daily   metoprolol succinate (TOPROL-XL) 50 mg Oral Tablet Sustained Release 24 hr 06/25/2019 at 0900  No Yes   Sig: Take 1 Tablet (50 mg total) by mouth Once a day for 30 days      Facility-Administered Medications: None       Inpatient Medications:     acetaminophen (TYLENOL) tablet, 650 mg, Oral, Q6H PRN  albuterol (PROVENTIL) 2.5mg / 0.5 mL nebulizer solution, 2.5 mg, Nebulization, 4x/day   And  ipratropium (ATROVENT) 0.02% nebulizer solution, 0.5 mg, Nebulization, 4x/day  albuterol (PROVENTIL) 2.5mg / 0.5 mL nebulizer solution, 2.5 mg, Nebulization, Q4H PRN  apixaban (ELIQUIS) tablet, 5 mg, Oral, 2x/day  arformoterol (BROVANA) 15 mcg/2 mL nebulizer solution, 15 mcg, Nebulization, 2x/day  aspirin chewable tablet 81 mg, 81 mg, Oral, Daily  atorvastatin (LIPITOR) tablet, 40 mg, Oral, QPM  budesonide (PULMICORT RESPULES) 0.5 mg/2 mL nebulizer suspension, 0.5 mg, Nebulization, 2x/day  cefTRIAXone (ROCEPHIN) 2 g in NS 50 mL IVPB, 2 g, Intravenous, Q24H  SSIP insulin lispro (HUMALOG) 100 units/mL SubQ pen, 1-12 Units, Subcutaneous, 4x/day AC   And  dextrose 50% (0.5 g/mL) injection - syringe, 12.5 g, Intravenous, Q15 Min PRN  furosemide (LASIX) tablet, 20 mg, Oral, Daily  guaiFENesin (MUCINEX) extended release tablet - for cough (expectorant), 600 mg, Oral, 2x/day  insulin glargine 100 units/mL SubQ pen, 30 Units, Subcutaneous, 2x/day  lisinopril (PRINIVIL) tablet, 5 mg, Oral, Daily  methylPREDNISolone sod succ (SOLU-MEDROL) 40 mg/mL injection, 40 mg, Intravenous, Q12H  metoprolol succinate (TOPROL-XL) 24 hr extended release tablet 75 mg, 75 mg, Oral, Daily  morphine 4 mg/mL injection, 4 mg, Intravenous, Q5 Min PRN  nitroGLYCERIN (NITROSTAT) sublingual tablet, 0.4 mg, Sublingual, Q5 Min PRN  NS 250 mL flush bag, , Intravenous, Q1H PRN  NS flush syringe, 10 mL, Intravenous, Q8HRS  NS flush syringe, 10 mL, Intravenous, Q1H PRN  prochlorperazine  (COMPAZINE) 5 mg/mL injection, 10 mg, Intravenous, Q6H PRN      Family History:     Family Medical History:     Problem Relation (Age of Onset)    COPD Mother    Coronary Artery Disease Father    Diabetes Mother, Sister, Brother, Paternal Grandmother  Social History:     Social History     Tobacco Use   . Smoking status: Current Every Day Smoker     Packs/day: 1.00     Years: 20.00     Pack years: 20.00     Types: Cigarettes   . Smokeless tobacco: Never Used   Vaping Use   . Vaping Use: Never used   Substance Use Topics   . Alcohol use: No   . Drug use: No       Review of Systems:     As per HPI. All other ROS negative.     Physical Exam:     Filed Vitals:    06/26/19 0430 06/26/19 0545 06/26/19 0643 06/26/19 1128   BP: 126/70 125/80 (!) 142/74 (!) 105/52   Pulse: (!) 108 (!) 101 (!) 113 93   Resp: (!) 25 (!) 22 (!) 22 (!) 21   Temp:   36.4 C (97.6 F) 36.6 C (97.8 F)   SpO2: 91% 95% 94% 93%     Body mass index is 34.01 kg/m.  No intake or output data in the 24 hours ending 06/26/19 1253    General Apppearance: appropriate stated age. Alert, no acute distress.     Neck: supple, no carotid bruit, no JVD.   Respiratory: clear to auscultation bilaterally, no accessory muscle use, bilateral wheezes.   Cardiovascular: normal rate, regular rhythm, s1 s2 normal, no murmur, no rubs, no gallops. Normal PMI.   Gastrointestinal: abdomen is soft, non-tender, and non-distended. Positive bowel sounds. No abdominal bruit.   Extremities: no clubbing, no cyanosis, no edema.   2+ bilateral radial pulses.   Skin: no rashes, no ecchymosis.   Neurologic: Alert, awake, oriented. Motor and sensory grossly intact. Speech output is normal.   Musculoskeletal: no joint tenderness, deformity.  Psychiatric: normal affect. Normal behavior.       Labs Reviewed:   BMP:   Recent Labs     06/26/19  0051   SODIUM 131*   POTASSIUM 3.6   CO2 34*   BUN 10   CREATININE 0.69     Magnesium: No results found for this encounter  CBC:   Recent  Labs     06/26/19  0051   WBC 19.7*  19.7   HGB 14.1   HCT 42.6   PLTCNT 521*     Hepatic Function:   Recent Labs     06/26/19  0051   AST 18   ALT 37*     Coags: No results found for this encounter  Cardiac Markers:   Recent Labs     06/26/19  0051   BNP 66     TSH:    Recent Labs     06/26/19  0712   TSH 0.361*     Lipids: No results found for this or any previous visit (from the past 14000 hour(s)).  \    Cardiovascular Workup:     EKG (directly reviewed): sinus tachycardia    Telemetry (directly reviewed): NSR, no ectopy    ECHO:  Conclusions:  The left ventricular ejection fraction by visual assessment is estimated to be 55-60%.  There is a moderate pericardial effusion.  The pericardial effusion is anterior.  No overt signs of tamponade.  Findings  Left Ventricle: Severely dilated left ventricle. Eccentric hypertrophy. The left ventricular ejection fraction by visual assessment is  estimated to be 55-60%. Left ventricular systolic function is normal. No segmental/regional wall motion abnormalities identified. Left  ventricular diastolic function is indeterminate.  Right Ventricle: Normal right ventricular size. Normal right ventricular systolic function. RV systolic pressure could not be determined  due to incomplete tricuspid regurgitation envelope.  Left Atrium: Mildly dilated left atrium.  Right Atrium: The right atrium is of normal size.  Mitral Valve: The mitral valve is normal. Trace mitral regurgitation present.  Tricuspid Valve: The tricuspid valve is not well visualized. No evidence of tricuspid regurgitation present.  Aortic Valve: The aortic valve is not well visualized. No Aortic valve stenosis. There is no evidence of aortic regurgitation.  Pulmonic Valve: The pulmonic valve is not well visualized. No evidence of pulmonic regurgitation.  Atrial Septum: The interatrial septum is not well visualized.  IVC/Hepatic Veins: The inferior vena cava was not visualized. IVC grossly normal in size.  Aorta:  The aortic root is of normal size. The ascending aorta is normal in size.  Pericardium/Pleural space: There is a moderate pericardial effusion. The pericardial effusion is anterior. No overt signs of  tamponade.    Assessment:     Ms. Cynitha Berte is a 64 year old woman with PMH of HTN, HLP, DM, admitted recently with new onset Afib, bilateral PNA and pericardial effusion. Currently in NSR. Will benefit from an ischemic evaluation and re-evaluation of effusion. Also, smoking cessation is a key to keeping her out of Afib.      Plan:     1. Afib RVR:  -Eliquis 5mg  po bid  -Keep lytes WNL K>4 and Mg>2  -Toprol XL 75mg  po daily  -TTE pending  -Event monitor on discharge  -Outpatient nuclear stress test     2. Bilateral PNA:  -Ceftriaxone 2gm IV q24 hours    3. COPD on home oxygen:  -Albuterol qid  -Atrovent qid  -Arformoterol BID  -Pulmicort 0.5mg  po bid  -Methyprednisolone 40mg  IV BID    4. DM:  -Glargine 30 units sq bid  -Monitor FS  -SSI    5. HTN:  -Lisinopril 5mg  po daily  -Toprol XL 75mg  po daily  -Lasix 20mg  po daily    Thank you for consulting [facility] for the care of this patien.  Will follow while inpatient and call us as further questions arise.     Dr. Julian Hy MD  ABIM Board Certified In Cardiovascular Disease  ABIM Board Certified In Internal Medicine  [facility], [address]  Ph: [phone]  Ph: [phone]  (cell): [phone]

## 2019-06-26 NOTE — ED Nurses Note (Signed)
Attempted to call report. Nurse passing meds at this time. States to call back in 42min

## 2019-06-26 NOTE — H&P (Signed)
[facility]  [place], [place] [address]    General History and Physical    [name], [name]  Date of Admission:  06/26/2019   Date of Service:  06/26/2019   Date of Birth:  1955/05/31    PCP: Hubbard Robinson, DO  Chief Complaint:  SOB    HPI: [name] is a 64 y.o., White female with a history of tobacco abuse Afib and COPD who presented to the ED complaining of shortness of breath above her normal. The pt was just admitted here on 6/8-6/10 for c/o SOB where she was found to be in Afib RVR (this was a new dx); imaging also revealed BL pneumonia and she was also noted to have a moderate pericardial effusion. she was started on Goessel and BB for her Afib, lasix, and treated with 6 total days of azithromycin and steroids which she complted yesterday. She says she hasn't felt improved since discharge. She claims to be compliant with medications. She feels her cough has become worse and is more productive but she deneis any fever, peripheral edema, chest pain, vomiting, diarrhea, urinary symptoms or localized calf pain.  She was noted to be in sinus tach, rate fluctuating between 90-120's. She does admit to occasional palpitations, nothing significant. Her CXR today appears normal apart from mild cardiomegaly. Lab workup reveals leukocytosis with bands. bnp normal today. Trop normal. Echo from recent admission noted below. No known hx of covid. She is not vaccinated. COVID swab negative today. She says her grandson has been sick recently as well.   Despite nebs and steroids in the ED She is stable on 3L which is what she is on at home but when she ambulates she becmoes very dyspneic and sats drop a bit. She was started on a cardizem drip in the ED and will be admitted to the hostpial for further treatment and monitoring of her COPD exacerbation       TTE from 06/18/19:  The left ventricular ejection fraction by visual assessment is estimated to be 55-60%.  There is a moderate pericardial effusion.  The  pericardial effusion is anterior.  No overt signs of tamponade    Patient Active Problem List    Diagnosis Date Noted    Atrial fibrillation with rapid ventricular response (CMS [place]) 06/18/2019    Acute hypoxemic respiratory failure (CMS HCC) 12/04/2018    Sepsis 10/27/2018    CAP (community acquired pneumonia) 10/26/2018    Acute exacerbation of chronic obstructive pulmonary disease (COPD) (CMS [place]) 03/27/2018    COPD with acute exacerbation (CMS HCC) 05/23/2017    Tobacco use disorder 05/22/2017    Acute respiratory failure (CMS HCC) 05/21/2017    Non-compliance 03/27/2017    Hypertension due to endocrine disorder 01/15/2016    Diabetes (CMS [place]) 11/18/2015    Obesity (BMI 35.0-39.9 without comorbidity) 10/08/2012       Past Medical History:   Diagnosis Date    COPD (chronic obstructive pulmonary disease) (CMS HCC)     Degenerative joint disease involving multiple joints     Diabetes mellitus (CMS [facility])     HTN (hypertension)     Smoker     Supplemental oxygen dependent     3 lpm O2 via NC    Wears glasses            Past Surgical History:   Procedure Laterality Date    HX CARPAL TUNNEL RELEASE  Medications Prior to Admission     Prescriptions    ADVAIR DISKUS 500-50 mcg/dose Inhalation Disk with Device oral diskus inhaler    INHALE 1 DOSE BY MOUTH TWICE DAILY    Patient taking differently:  Take 1 INHALATION by inhalation Twice daily INHALE 1 DOSE BY MOUTH TWICE DAILY    albuterol (PROVENTIL) 2.5 mg/0.5 mL Inhalation Solution for Nebulization    USE 1 VIAL IN NEBULIZER EVERY 4 HOURS    Patient taking differently:  2.5 mg by Nebulization route Every 4 hours as needed     albuterol sulfate (PROVENTIL) 2.5 mg /3 mL (0.083 %) Inhalation Solution for Nebulization    USE 1 VIAL IN NEBULIZER 4 TIMES DAILY    Patient taking differently:  2.5 mg by Nebulization route Every 4 hours as needed     apixaban (ELIQUIS) 5 mg Oral Tablet    Take 1 Tablet (5 mg total) by mouth Every 12 hours for 30  days    aspirin 81 mg Oral Tablet, Chewable    Take 1 Tab (81 mg total) by mouth Once a day    atorvastatin (LIPITOR) 40 mg Oral Tablet    Take 1 tablet by mouth once daily    Patient taking differently:  Take 40 mg by mouth Every evening     ATROVENT HFA 17 mcg/actuation Inhalation HFA Aerosol Inhaler oral inhaler    INHALE 2 PUFFS BY MOUTH 4 TIMES DAILY    fluticasone furoate-vilanteroL (BREO ELLIPTA) 200-25 mcg/dose Inhalation Disk with Device    Take 1 INHALATION by inhalation Once a day    Patient not taking:  Reported on 03/08/2019    furosemide (LASIX) 20 mg Oral Tablet    Take 1/2 (one-half) tablet by mouth once daily    Patient taking differently:  Take 20 mg by mouth Once a day     insulin glargine (LANTUS SOLOSTAR U-100 INSULIN) 100 unit/mL Subcutaneous Insulin Pen    30 Units by Subcutaneous route Twice daily    ipratropium (ATROVENT) 0.02 % Inhalation Solution    2.5 mL (0.5 mg total) by Nebulization route ONCE EVERY 4 HOURS    lisinopriL (PRINIVIL) 5 mg Oral Tablet    Take 1 tablet by mouth once daily    Patient taking differently:  Take 5 mg by mouth Once a day Take 1 tablet by mouth once daily    metoprolol succinate (TOPROL-XL) 50 mg Oral Tablet Sustained Release 24 hr    Take 1 Tablet (50 mg total) by mouth Once a day for 30 days        dilTIAZem (CARDIZEM) 125mg  in NS 175mL (tot vol) premix infusion, 10 mg/hr, Intravenous, Continuous  dilTIAZem (CARDIZEM) 5 mg/mL injection, 0.25 mg/kg, Intravenous, Once        Allergies   Allergen Reactions    Codeine Nausea/ Vomiting     Sick to stomach    Hydrocodone Nausea/ Vomiting    Metformin Diarrhea    Oxycodone Nausea/ Vomiting       Social History     Tobacco Use    Smoking status: Current Every Day Smoker     Packs/day: 1.00     Years: 20.00     Pack years: 20.00     Types: Cigarettes    Smokeless tobacco: Never Used   Substance Use Topics    Alcohol use: No     Reviewed on admission   Family Medical History:     Problem Relation (Age of Onset)  COPD Mother    Coronary Artery Disease Father    Diabetes Mother, Sister, Brother, Paternal Grandmother              ROS: Other than ROS in the HPI, all other systems were negative.    EXAM:  Temperature: 37 C (98.6 F)  Heart Rate: (!) 113  BP (Non-Invasive): (!) 125/90  Respiratory Rate: (!) 22  SpO2: 96 %  General: no acute distress, resting comfortably    Eyes: Pupils equal and round, reactive to light. Anicteric  HEENT: Head atraumatic and normocephalic, MMM  Neck: No JVD or thyromegaly or lymphadenopathy   Lungs: mild respiratory distress. Lungs dmiinished, slightl crackles in LLB. Diffuse wheezing  Cardiovascular: normal rate, regular rhythm, S1, S2 normal, no murmur appreciated. No audible rubs   Abdomen: Soft, non-tender, non-distended, bowel sounds normal  Extremities: extremities normal, atraumatic, no cyanosis or edema. DP pulses 2+ and equal bilaterally.   Skin: Skin warm and dry   Neurologic: Alert, oriented, no focal deficit, CN II-XII grossly intact  Lymphatics: No lymphadenopathy   Psychiatric: Normal affect, behavior       Labs:    I have reviewed all lab results.  Lab Results for Last 24 Hours:    Results for orders placed or performed during the hospital encounter of 06/26/19 (from the past 24 hour(s))   B-TYPE NATRIURETIC PEPTIDE   Result Value Ref Range    BNP 66 <=99 pg/mL   COMPREHENSIVE METABOLIC PANEL, NON-FASTING   Result Value Ref Range    SODIUM 131 (L) 136 - 145 mmol/L    POTASSIUM 3.6 3.5 - 5.1 mmol/L    CHLORIDE 90 (L) 96 - 111 mmol/L    CO2 TOTAL 34 (H) 23 - 31 mmol/L    ANION GAP 7 4 - 13 mmol/L    BUN 10 8 - 25 mg/dL    CREATININE 0.69 0.60 - 1.05 mg/dL    BUN/CREA RATIO 14 6 - 22    ESTIMATED GFR >90 >=60 mL/min/BSA    ALBUMIN 3.4 3.4 - 4.8 g/dL     CALCIUM 9.5 8.8 - 10.2 mg/dL    GLUCOSE 236 (H) 65 - 125 mg/dL    ALKALINE PHOSPHATASE 131 (H) 50 - 130 U/L    ALT (SGPT) 37 (H) 8 - 22 U/L    AST (SGOT)  18 8 - 45 U/L    BILIRUBIN TOTAL 0.9 0.3 - 1.3 mg/dL    PROTEIN TOTAL 7.5 6.0 -  8.0 g/dL   TROPONIN-I   Result Value Ref Range    TROPONIN I <7 (L) 7 - 30 ng/L   CBC WITH DIFF   Result Value Ref Range    WBC 19.7 (H) 4.0 - 11.0 x103/uL    RBC 4.74 4.00 - 5.10 x106/uL    HGB 14.1 12.0 - 15.5 g/dL    HCT 42.6 36.0 - 45.0 %    MCV 90.0 82.0 - 97.0 fL    MCH 29.7 27.5 - 33.2 pg    MCHC 33.0 32.0 - 36.0 g/dL    RDW 14.1 11.0 - 16.0 %    PLATELETS 521 (H) 150 - 450 x103/uL    MPV 8.2 7.4 - 10.5 fL   MANUAL DIFFERENTIAL   Result Value Ref Range    NEUTROPHIL % 57 43 - 76 %    LYMPHOCYTE % 25 15 - 43 %    MONOCYTE % 15 (H) 5 - 12 %    EOSINOPHIL % 1 0 -  5 %    BASOPHIL % 1 0 - 3 %    BAND % 1 0 - 4 %    NEUTROPHIL ABSOLUTE 11.43 (H) 1.50 - 6.50 x103/uL    LYMPHOCYTE ABSOLUTE 4.93 (H) 1.00 - 4.80 x103/uL    MONOCYTE ABSOLUTE 2.96 (H) 0.20 - 0.90 x103/uL    EOSINOPHIL ABSOLUTE 0.20 0.00 - 0.50 x103/uL    BASOPHIL ABSOLUTE 0.20 (H) 0.00 - 0.10 x103/uL    PLATELET ESTIMATE Increased     RBC MORPHOLOGY COMMENT Normal     WBC MORPHOLOGY COMMENT Normal     WBC 19.7 x103/uL       Imaging Studies:      Radiology:    XR AP MOBILE CHEST   XR AP MOBILE CHEST   Final Result   Normal appearance of lungs. Mild cardiomegaly.         Radiologist location ID: [id number]            Assessment/Plan:   [name] is a 64 y.o., White female who presents with the following:    1. COPD exacerbation   --COVID negative. She does say her grandson has been sick as well. While her CXR looks slightly improved today, clinically she is having worsening productive cough and labs reveal new leukocytosis and band so will check a sputum culture and treat with at least a few more days of iv rocephin  --scheduled duonbes, budesonide and formoterol nebs  --IV Solu-Medrol 40 mg b.i.d. for now  --she is stable on 3 L right now, which is which she is on at home    2. History of AFib  Recent diagnosis last week.  Reports compliance with her medications  --patient sinus tach in the 120s ED she was treated with a Cardizem bolus and  started on a Cardizem drip.  Sinus tach in the 110s now, Can continue drip for a couple of hours then can be turned off (unless heart rate goes down sooner)  -- increase metoprolol xr from 50 mg to 75 mg daily  --continue Eliquis  --keep Mag>2 and K>4. (giving a 40 mEq dose of p.o. potassium now, and checking Mag)  --telemetry    3. Hx of Pericardial effusion  4. Chronic diastolic heart failure  no obvious acute exacerbtaion here really.  Recent echo with preserved EF noted in HPI   --continue recently started lasix, ace and beta-blocker  --monitor fluid status closely    5. Tobacco abuse   --pt counseled on cessation     6. Type 2 diabetes  --continue home Lantus. Add ssi coverage with Humalog      DVT PPx: Eliquis   Code Status:  Full COde    Brenton Grills, DNP,FNP-C       Portions of this note may be dictated using voice recognition software or a dictation service. Variances in spelling and vocabulary are possible and unintentional. Not all errors are caught/corrected. Please notify the [facility] if any discrepancies are noted or if the meaning of any statement is not clear.

## 2019-06-26 NOTE — Progress Notes (Signed)
History, physical examination and management plan done by nurse practitioner Brenton Grills was reviewed and all the findings confirmed on examination of the patient today.  A consult was requested from Cardiology Service for her atrial fibrillation with rapid ventricular rate and her being on Cardizem drip.

## 2019-06-26 NOTE — ED Nurses Note (Signed)
Per Bryon Lions FNP, okay to give Cardizem gtt at this time

## 2019-06-26 NOTE — Nurses Notes (Signed)
Assumed care of patient at this time. Patient appears to be resting with her eyes closed but easily awakens. Introduced self to patient as her Therapist, sports. The patient is pleasant and answers questions appropriately. Alert and orient. She has an IV site to the right hand- 20 gauge- currently SL and an IV site to the right forearm- 18 gauge with cardizem running at 10 mg/hr. Review of the chart shows that this ordered was decreased upon admission which recently occurred, and the gtt was changed to 5 mg/hr. Patient wears 3L NC at home and is currently on 3L NC at this time. Patient denies any pain or discomfort. Patient is on Tele 1- NSR in the 90's. Patient denies any further requests at this time. Call bell within reach, instructed to call if she needs any assistance. Will continue to monitor.

## 2019-06-26 NOTE — ED Nurses Note (Signed)
Per cardiac monitor patient appears to be sinus tach with rate of 110 after Cardizem bolus. Additioned EKG performed. Patient denies chest pain/ discomfort. Meg FNP notified and made aware of EKG.

## 2019-06-26 NOTE — ED Nurses Note (Signed)
Attempted to call report. States nurse is unavailable at this time. Charge nurse notified and made aware of same.

## 2019-06-26 NOTE — Care Management Notes (Signed)
06/26/19 1600   Assessment Detail   Assessment Type Admission   Date of Care Management Update 06/26/19   Social Work Plan   Discharge Planning Status initial meeting   Projected Discharge Date 06/26/19   Anticipated Discharge Disposition Home  (home O2 needs checked by [facility])   Discharge Needs Assessment   Equipment Currently Used at Home nebulizer;oxygen   Referral Information   Admission Type inpatient   Arrived From home or self-care   Address Verified verified-no changes   Insurance Verified verified-no change   Source of Information Patient   Reason for Consult discharge planning   Living Environment   Lives With spouse   Living Arrangements apartment     Discharge planning-spoke with pt about discharge needs.  Pt lives with spouse, and her adult daughter is staying with her.  Durable medical equipment used in the home includes; nebulizer, home O2 4/L, supplied by [employer], and pulse ox.  Pt does drives, with adequate transportation.  Prescriptions are obtained from [facility] in [place].  Pt is active with PCP Dr. Hubbard Robinson, with every 3-6 months visits.  Eliquis is a prior to admission medication, and no need for CM intervention for manufacturing cards.  Pt verbalized that her home O2 concentrator is not working, and she had 2 admission because of this.  I called [facility], and spoke with Raquel Sarna.  She verbalized that she will call pt's spouse, and see if they can evaluate the concentrator on Friday.  If the pt discharges tomorrow, then they will go to the home then.

## 2019-06-26 NOTE — ED Provider Notes (Signed)
Malachy Chamber, MD  [facility]  Emergency Department Visit Note    Date:  06/26/2019  Primary care provider:  Hubbard Robinson, DO  Means of arrival:  private car  History obtained from: patient  History limited by: none    Chief Complaint:  Shortness of [name], date of birth 06-17-1955, is a 64 y.o. female who presents to the Emergency Department complaining of shortness of breath. Patient states she has been "fighting to breath" over the past few days. The patient has a history of COPD and is on 3L of supplemental O2 at her baseline, but she has recently been using 4L. Patient was seen here in the ED on 6/8 and admitted until 6/10 after being diagnosed with bilateral pneumonia. She has complained of some shortness of breath since being discharged home. No syncope, dizziness, hemoptysis, measured fevers, vomiting, diarrhea, urinary symptoms or localized calf pain.    REVIEW OF SYSTEMS     The pertinent positive and negative symptoms are as per HPI. All other systems reviewed and are negative.     PATIENT HISTORY     Past Medical History:  Past Medical History:   Diagnosis Date   . COPD (chronic obstructive pulmonary disease) (CMS HCC)    . Degenerative joint disease involving multiple joints    . Diabetes mellitus (CMS [place])    . HTN (hypertension)    . Smoker    . 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 Wears glasses        Past Surgical History:  Past Surgical History:   Procedure Laterality Date   . Hx carpal tunnel release         Family History:  Family Medical History:     Problem Relation (Age of Onset)    COPD Mother    Coronary Artery Disease Father    Diabetes Mother, Sister, Brother, Paternal Grandmother          Social History:  Social History     Tobacco Use   . Smoking status: Current Every Day Smoker     Packs/day: 1.00     Years: 20.00     Pack years: 20.00     Types: Cigarettes   . Smokeless tobacco: Never Used   Vaping Use   .  Vaping Use: Never used   Substance Use Topics   . Alcohol use: No   . Drug use: No     Social History     Substance and Sexual Activity   Drug Use No       Medications:  Outpatient Medications Marked as Taking for the 06/26/19 encounter [facility] Encounter)   Medication Sig   . ADVAIR DISKUS 500-50 mcg/dose Inhalation Disk with Device oral diskus inhaler INHALE 1 DOSE BY MOUTH TWICE DAILY (Patient taking differently: Take 1 INHALATION by inhalation Twice daily INHALE 1 DOSE BY MOUTH TWICE DAILY)   . albuterol (PROVENTIL) 2.5 mg/0.5 mL Inhalation Solution for Nebulization USE 1 VIAL IN NEBULIZER EVERY 4 HOURS (Patient taking differently: 2.5 mg by Nebulization route Every 4 hours as needed )   . albuterol sulfate (PROVENTIL) 2.5 mg /3 mL (0.083 %) Inhalation Solution for Nebulization USE 1 VIAL IN NEBULIZER 4 TIMES DAILY (Patient taking differently: 2.5 mg by Nebulization route Every 4 hours as needed )   . apixaban (ELIQUIS) 5 mg Oral Tablet Take  1 Tablet (5 mg total) by mouth Every 12 hours for 30 days   . ATROVENT HFA 17 mcg/actuation Inhalation HFA Aerosol Inhaler oral inhaler INHALE 2 PUFFS BY MOUTH 4 TIMES DAILY   . insulin glargine (LANTUS SOLOSTAR U-100 INSULIN) 100 unit/mL Subcutaneous Insulin Pen 30 Units by Subcutaneous route Twice daily   . ipratropium (ATROVENT) 0.02 % Inhalation Solution 2.5 mL (0.5 mg total) by Nebulization route ONCE EVERY 4 HOURS   . metoprolol succinate (TOPROL-XL) 50 mg Oral Tablet Sustained Release 24 hr Take 1 Tablet (50 mg total) by mouth Once a day for 30 days       Allergies:  Allergies   Allergen Reactions   . Codeine Nausea/ Vomiting     Sick to stomach   . Hydrocodone Nausea/ Vomiting   . Metformin Diarrhea   . Oxycodone Nausea/ Vomiting       PHYSICAL EXAM     Vitals:  Filed Vitals:    06/26/19 0039   BP: (!) 180/168   Pulse: (!) 130   Resp: (!) 24   Temp: 37 C (98.6 F)   SpO2: 92%     Pulse ox  92% on Nasal Cannula interpreted by me as: Normal    Constitutional: The  patient is alert and oriented to person, place, and time. Well-developed and well-nourished.  HENT: Atraumatic, normocephalic head. Mucous membranes moist. Nares unremarkable. Oropharynx shows no erythema or exudate.   Eyes: Pupils equal and round, reactive to light. No scleral icterus. Normal conjunctiva. Extraocular movements are intact.  Neck: Supple, non-tender, no nuchal rigidity, no adenopathy.   Lungs: Diffuse polyphonic wheezing. Symmetric and equal expansion. Mild to moderate shortness of breath but no respiratory distress or retractions.  Cardiovascular: Heart is S1-S2 regular rate and regular rhythm without murmur click or rub.  Abdomen:  Soft, non-distended. No tenderness to palpation without evidence of rebound or guarding. No pulsatile masses. No organomegaly.   Genitourinary: No CVA tenderness.  Extremities: Full range of motion, no clubbing, cyanosis, or edema. Pulses 2+, capillary refill <2 seconds.  Spine: No midline or paraspinal muscle tenderness to palpation. No step-off.   Skin: Warm and dry. No cyanosis, jaundice, rash or lesion.  Neurologic: Alert and oriented x3. Normal facial symmetry and speech, Normal upper and lower extremity strength, and grossly normal sensation.     DIFFERENTIAL DIAGNOSES     1. COPD  2. CHF  3. Pneumonia   4. Failure of outpatient management    DIAGNOSTIC STUDIES     Labs:    Results for orders placed or performed during the hospital encounter of 06/26/19   B-TYPE NATRIURETIC PEPTIDE   Result Value Ref Range    BNP 66 <=99 pg/mL   COMPREHENSIVE METABOLIC PANEL, NON-FASTING   Result Value Ref Range    SODIUM 131 (L) 136 - 145 mmol/L    POTASSIUM 3.6 3.5 - 5.1 mmol/L    CHLORIDE 90 (L) 96 - 111 mmol/L    CO2 TOTAL 34 (H) 23 - 31 mmol/L    ANION GAP 7 4 - 13 mmol/L    BUN 10 8 - 25 mg/dL    CREATININE 0.69 0.60 - 1.05 mg/dL    BUN/CREA RATIO 14 6 - 22    ESTIMATED GFR >90 >=60 mL/min/BSA    ALBUMIN 3.4 3.4 - 4.8 g/dL     CALCIUM 9.5 8.8 - 10.2 mg/dL    GLUCOSE 236 (H)  65 - 125 mg/dL    ALKALINE PHOSPHATASE 131 (H)  50 - 130 U/L    ALT (SGPT) 37 (H) 8 - 22 U/L    AST (SGOT)  18 8 - 45 U/L    BILIRUBIN TOTAL 0.9 0.3 - 1.3 mg/dL    PROTEIN TOTAL 7.5 6.0 - 8.0 g/dL   TROPONIN-I   Result Value Ref Range    TROPONIN I <7 (L) 7 - 30 ng/L   CBC WITH DIFF   Result Value Ref Range    WBC 19.7 (H) 4.0 - 11.0 x10^3/uL    RBC 4.74 4.00 - 5.10 x10^6/uL    HGB 14.1 12.0 - 15.5 g/dL    HCT 42.6 36.0 - 45.0 %    MCV 90.0 82.0 - 97.0 fL    MCH 29.7 27.5 - 33.2 pg    MCHC 33.0 32.0 - 36.0 g/dL    RDW 14.1 11.0 - 16.0 %    PLATELETS 521 (H) 150 - 450 x10^3/uL    MPV 8.2 7.4 - 10.5 fL   MANUAL DIFFERENTIAL   Result Value Ref Range    NEUTROPHIL % 57 43 - 76 %    LYMPHOCYTE % 25 15 - 43 %    MONOCYTE % 15 (H) 5 - 12 %    EOSINOPHIL % 1 0 - 5 %    BASOPHIL % 1 0 - 3 %    BAND % 1 0 - 4 %    NEUTROPHIL ABSOLUTE 11.43 (H) 1.50 - 6.50 x10^3/uL    LYMPHOCYTE ABSOLUTE 4.93 (H) 1.00 - 4.80 x10^3/uL    MONOCYTE ABSOLUTE 2.96 (H) 0.20 - 0.90 x10^3/uL    EOSINOPHIL ABSOLUTE 0.20 0.00 - 0.50 x10^3/uL    BASOPHIL ABSOLUTE 0.20 (H) 0.00 - 0.10 x10^3/uL    PLATELET ESTIMATE Increased     RBC MORPHOLOGY COMMENT Normal     WBC MORPHOLOGY COMMENT Normal     WBC 19.7 x10^3/uL     Labs reviewed and interpreted by me. COVID screening pending at the time of admission.    Radiology:    XR AP MOBILE CHEST   XR AP MOBILE CHEST   Final Result   Normal appearance of lungs. Mild cardiomegaly.         Radiologist location ID: [id number]           Radiological imaging interpreted by radiologist and independently reviewed by me.    EKG:  12 lead EKG interpreted by me shows sinus tachycardia, rate of 130 bpm, first degre AV block, normal axis, nonspecific ST/T wave abnormalities.    ED PROGRESS NOTE / [facility] records reviewed by me:  I have reviewed the nurse's notes. I have reviewed the patient's problem list and pertinent past medical records.     Orders Placed This Encounter   . XR AP MOBILE CHEST   . B-TYPE  NATRIURETIC PEPTIDE   . COMPREHENSIVE METABOLIC PANEL, NON-FASTING   . TROPONIN-I   . CBC WITH DIFF   . MANUAL DIFFERENTIAL   . COVID-19 SCREENING (COVID only)   . ECG 12-LEAD   . ECG 12-LEAD   . INSERT & MAINTAIN PERIPHERAL IV ACCESS   . albuterol (PROVENTIL) 2.5mg / 0.5 mL nebulizer solution   . ipratropium (ATROVENT) 0.02% nebulizer solution   . magnesium sulfate 2 G in SW 50 mL premix IVPB   . dexamethasone 10 mg/mL injection   . dilTIAZem (CARDIZEM) 5 mg/mL injection   . dilTIAZem (CARDIZEM) 125mg  in NS 116mL (tot vol) premix infusion  00:28: Initial evaluation is complete at this time. I discussed with the patient that I would order a CXR, labs, and an EKG to further evaluate. I will treat the patient's symptoms with a duoneb breathing treatment, mag sulfate, and dexamethasone. Patient is agreeable with the treatment plan at this time.     03:45: Workup reviewed. CXR negative. Labs noted. On recheck, I explained the results of the diagnostic studies, and the patient does not feel comfortable with going home. Will page Hospitalist to discuss admission.    03:52: I discussed the patient's case and above findings at length with Dr. Delora [facility]) who will admit the patient. Will treat patient with Cardizem in meantime which he is agreeable with.    MIPS      Not applicable     OPIATE PRESCRIPTION       Not applicable    CORE MEASURES      Not applicable    CRITICAL CARE TIME      Critical care time -- 60 minutes, exclusive of procedures     PRE-DISPOSITION VITALS      Pre-Disposition Vitals:  Filed Vitals:    06/26/19 0039   BP: (!) 180/168   Pulse: (!) 130   Resp: (!) 24   Temp: 37 C (98.6 F)   SpO2: 92%     CLINICAL IMPRESSION     Encounter Diagnoses   Name Primary?   . Atrial fibrillation with rapid ventricular response (CMS HCC) Yes   . COPD with acute exacerbation (CMS HCC)    . Tobacco use disorder      DISPOSITION/PLAN     Admitted        Condition at Disposition: [place], SCRIBE scribed for Malachy Chamber, MD on 06/26/2019 at 12:25 AM.     Documentation assistance provided for Malachy Chamber, MD  by Rosann Auerbach, SCRIBE. Information recorded by the scribe was done at my direction and has been reviewed and validated by me Malachy Chamber, MD.

## 2019-06-27 ENCOUNTER — Inpatient Hospital Stay (HOSPITAL_COMMUNITY): Payer: Medicaid Other

## 2019-06-27 DIAGNOSIS — J189 Pneumonia, unspecified organism: Secondary | ICD-10-CM

## 2019-06-27 DIAGNOSIS — Z6835 Body mass index (BMI) 35.0-35.9, adult: Secondary | ICD-10-CM

## 2019-06-27 DIAGNOSIS — I480 Paroxysmal atrial fibrillation: Secondary | ICD-10-CM

## 2019-06-27 DIAGNOSIS — I313 Pericardial effusion (noninflammatory): Secondary | ICD-10-CM

## 2019-06-27 LAB — BASIC METABOLIC PANEL
ANION GAP: 8 mmol/L (ref 4–13)
BUN/CREA RATIO: 25 — ABNORMAL HIGH (ref 6–22)
BUN: 15 mg/dL (ref 8–25)
CALCIUM: 8.4 mg/dL — ABNORMAL LOW (ref 8.8–10.2)
CHLORIDE: 91 mmol/L — ABNORMAL LOW (ref 96–111)
CO2 TOTAL: 32 mmol/L — ABNORMAL HIGH (ref 23–31)
CREATININE: 0.61 mg/dL (ref 0.60–1.05)
ESTIMATED GFR: >90 mL/min/BSA (ref >=60)
GLUCOSE: 248 mg/dL — ABNORMAL HIGH (ref 65–125)
POTASSIUM: 4.6 mmol/L (ref 3.5–5.1)
SODIUM: 131 mmol/L — ABNORMAL LOW (ref 136–145)

## 2019-06-27 LAB — CBC WITH DIFF
BASOPHIL #: 0 10*3/uL (ref 0.00–0.10)
BASOPHIL %: 0 % (ref 0–3)
EOSINOPHIL #: 0 10*3/uL (ref 0.00–0.50)
EOSINOPHIL %: 0 % (ref 0–5)
HCT: 37.8 % (ref 36.0–45.0)
HGB: 12.5 g/dL (ref 12.0–15.5)
LYMPHOCYTE #: 1.1 10*3/uL (ref 1.00–4.80)
LYMPHOCYTE %: 5 % — ABNORMAL LOW (ref 15–43)
MCH: 29.7 pg (ref 27.5–33.2)
MCHC: 32.9 g/dL (ref 32.0–36.0)
MCV: 90.3 fL (ref 82.0–97.0)
MONOCYTE #: 1.3 10*3/uL — ABNORMAL HIGH (ref 0.20–0.90)
MONOCYTE %: 6 % (ref 5–12)
MPV: 8.5 fL (ref 7.4–10.5)
NEUTROPHIL #: 18.9 10*3/uL — ABNORMAL HIGH (ref 1.50–6.50)
NEUTROPHIL %: 89 % — ABNORMAL HIGH (ref 43–76)
PLATELETS: 477 10*3/uL — ABNORMAL HIGH (ref 150–450)
RBC: 4.19 10*6/uL (ref 4.00–5.10)
RDW: 14 % (ref 11.0–16.0)
WBC: 21.4 10*3/uL — ABNORMAL HIGH (ref 4.0–11.0)

## 2019-06-27 LAB — POC FINGERSTICK GLUCOSE - BMC/JMC (RESULTS)
GLUCOSE, POC: 268 mg/dl — ABNORMAL HIGH (ref 60–100)
GLUCOSE, POC: 325 mg/dl — ABNORMAL HIGH (ref 60–100)
GLUCOSE, POC: 334 mg/dl — ABNORMAL HIGH (ref 60–100)
GLUCOSE, POC: 273 mg/dl — ABNORMAL HIGH (ref 60–100)

## 2019-06-27 LAB — PT/INR
INR: 1.64
PROTHROMBIN TIME: 19.1 seconds — ABNORMAL HIGH (ref 9.4–12.5)

## 2019-06-27 LAB — MAGNESIUM: MAGNESIUM: 2.3 mg/dL (ref 1.8–2.6)

## 2019-06-27 MED ORDER — INSULIN GLARGINE (U-100) 100 UNIT/ML (3 ML) SUBCUTANEOUS PEN
40.00 [IU] | PEN_INJECTOR | Freq: Two times a day (BID) | SUBCUTANEOUS | Status: DC
Start: 2019-06-27 — End: 2019-06-28
  Administered 2019-06-27 – 2019-06-28 (×2): 40 [IU] via SUBCUTANEOUS

## 2019-06-27 MED ORDER — METHYLPREDNISOLONE SOD SUCCINATE 40 MG/ML SOLUTION FOR INJ. WRAPPER
40.00 mg | Freq: Every day | INTRAMUSCULAR | Status: DC
Start: 2019-06-28 — End: 2019-06-29
  Administered 2019-06-28: 40 mg via INTRAVENOUS
  Administered 2019-06-29: 0 mg via INTRAVENOUS
  Filled 2019-06-27: qty 1

## 2019-06-27 MED ORDER — AZITHROMYCIN 500 MG INTRAVENOUS SOLUTION
500.00 mg | INTRAVENOUS | Status: DC
Start: 2019-06-27 — End: 2019-06-27

## 2019-06-27 MED ORDER — PERFLUTREN LIPID MICROSPHERES 1.1 MG/ML INTRAVENOUS SUSPENSION
2.00 mL | INTRAVENOUS | Status: AC
Start: 2019-06-27 — End: 2019-06-27
  Administered 2019-06-27: 2 mL via INTRAVENOUS

## 2019-06-27 MED ORDER — CEFEPIME 2 GRAM/100 ML IN DEXTROSE (ISO-OSMOTIC) INTRAVENOUS PIGGYBACK
2.0000 g | INJECTION | Freq: Two times a day (BID) | INTRAVENOUS | Status: DC
  Administered 2019-06-27: 0 g via INTRAVENOUS
  Administered 2019-06-27 – 2019-06-28 (×3): 2 g via INTRAVENOUS
  Administered 2019-06-28 (×2): 0 g via INTRAVENOUS
  Administered 2019-06-29: 2 g via INTRAVENOUS
  Administered 2019-06-29: 0 g via INTRAVENOUS
  Filled 2019-06-27 (×6): qty 100

## 2019-06-27 NOTE — Progress Notes (Addendum)
[facility]  Progress Note      Date Time: 06/27/2019 07:54  Patient Name: [name]  MRN#: [id number]  DOB: May 20, 1955  Consulting Physician: Evalee Jefferson, MD  Length of stay: 1 days    SUBJECTIVE:     64 year old woman with PMH of COPD, DJD, DM, HTN, Smoker admitted with Afib RVR  Treated with Diltiazem gtt     Overnight, converted to NSR  Telemetry shows NSR, no ectopy  BP stable  No cardiac complaints today; states breathing is at baseline  On Eliquis 5mg  po bid    Past Medical History:     Past Medical History:   Diagnosis Date   . COPD (chronic obstructive pulmonary disease) (CMS HCC)    . Degenerative joint disease involving multiple joints    . Diabetes mellitus (CMS [place])    . HTN (hypertension)    . Smoker    . 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 Wears glasses          Past Surgical History:     Past Surgical History:   Procedure Laterality Date   . HX CARPAL TUNNEL RELEASE           Allergies:     Allergies   Allergen Reactions   . Codeine Nausea/ Vomiting     Sick to stomach   . Hydrocodone Nausea/ Vomiting   . Metformin Diarrhea   . Oxycodone Nausea/ Vomiting     Outpatient Medications:     Prior to Admission Medications   Prescriptions Last Dose Informant Patient Reported? Taking?   ADVAIR DISKUS 500-50 mcg/dose Inhalation Disk with Device oral diskus inhaler 06/25/2019 at 2100 Patient No Yes   Sig: INHALE 1 DOSE BY MOUTH TWICE DAILY   Patient taking differently: Take 1 INHALATION by inhalation Twice daily INHALE 1 DOSE BY MOUTH TWICE DAILY   ATROVENT HFA 17 mcg/actuation Inhalation HFA Aerosol Inhaler oral inhaler 06/25/2019 at 2100  No Yes   Sig: INHALE 2 PUFFS BY MOUTH 4 TIMES DAILY   albuterol (PROVENTIL) 2.5 mg/0.5 mL Inhalation Solution for Nebulization 06/26/2019 at 0015 Patient No Yes   Sig: USE 1 VIAL IN NEBULIZER EVERY 4 HOURS   Patient taking differently: 2.5 mg by Nebulization route Every 4 hours as needed    albuterol sulfate (PROVENTIL) 2.5 mg /3 mL  (0.083 %) Inhalation Solution for Nebulization 06/26/2019 at 0015 Patient No Yes   Sig: USE 1 VIAL IN NEBULIZER 4 TIMES DAILY   Patient taking differently: 2.5 mg by Nebulization route Every 4 hours as needed    apixaban (ELIQUIS) 5 mg Oral Tablet 06/25/2019 at 2100  No Yes   Sig: Take 1 Tablet (5 mg total) by mouth Every 12 hours for 30 days   aspirin 81 mg Oral Tablet, Chewable Past Week at Unknown time Patient No No   Sig: Take 1 Tab (81 mg total) by mouth Once a day   atorvastatin (LIPITOR) 40 mg Oral Tablet Past Week at Unknown time Patient No No   Sig: Take 1 tablet by mouth once daily   Patient taking differently: Take 40 mg by mouth Every evening    fluticasone furoate-vilanteroL (BREO ELLIPTA) 200-25 mcg/dose Inhalation Disk with Device Unknown at Unknown time  No No   Sig: Take 1 INHALATION by inhalation Once a day   Patient not taking: Reported on 03/08/2019   furosemide (LASIX) 20 mg Oral Tablet  Past Week at Unknown time Patient No No   Sig: Take 1/2 (one-half) tablet by mouth once daily   Patient taking differently: Take 20 mg by mouth Once a day    insulin glargine (LANTUS SOLOSTAR U-100 INSULIN) 100 unit/mL Subcutaneous Insulin Pen 06/25/2019 at 2100  No Yes   Sig: 30 Units by Subcutaneous route Twice daily   ipratropium (ATROVENT) 0.02 % Inhalation Solution 06/26/2019 at 0015  No Yes   Sig: 2.5 mL (0.5 mg total) by Nebulization route ONCE EVERY 4 HOURS   lisinopriL (PRINIVIL) 5 mg Oral Tablet Past Week at Unknown time Patient No No   Sig: Take 1 tablet by mouth once daily   Patient taking differently: Take 5 mg by mouth Once a day Take 1 tablet by mouth once daily   metoprolol succinate (TOPROL-XL) 50 mg Oral Tablet Sustained Release 24 hr 06/25/2019 at 0900  No Yes   Sig: Take 1 Tablet (50 mg total) by mouth Once a day for 30 days      Facility-Administered Medications: None       Inpatient Medications:     acetaminophen (TYLENOL) tablet, 650 mg, Oral, Q6H PRN  albuterol (PROVENTIL) 2.5mg / 0.5 mL  nebulizer solution, 2.5 mg, Nebulization, 4x/day   And  ipratropium (ATROVENT) 0.02% nebulizer solution, 0.5 mg, Nebulization, 4x/day  albuterol (PROVENTIL) 2.5mg / 0.5 mL nebulizer solution, 2.5 mg, Nebulization, Q4H PRN  apixaban (ELIQUIS) tablet, 5 mg, Oral, 2x/day  arformoterol (BROVANA) 15 mcg/2 mL nebulizer solution, 15 mcg, Nebulization, 2x/day  aspirin chewable tablet 81 mg, 81 mg, Oral, Daily  atorvastatin (LIPITOR) tablet, 40 mg, Oral, QPM  budesonide (PULMICORT RESPULES) 0.5 mg/2 mL nebulizer suspension, 0.5 mg, Nebulization, 2x/day  cefTRIAXone (ROCEPHIN) 2 g in NS 50 mL IVPB, 2 g, Intravenous, Q24H  SSIP insulin lispro (HUMALOG) 100 units/mL SubQ pen, 1-12 Units, Subcutaneous, 4x/day AC   And  dextrose 50% (0.5 g/mL) injection - syringe, 12.5 g, Intravenous, Q15 Min PRN  furosemide (LASIX) tablet, 20 mg, Oral, Daily  guaiFENesin (MUCINEX) extended release tablet - for cough (expectorant), 600 mg, Oral, 2x/day  insulin glargine 100 units/mL SubQ pen, 30 Units, Subcutaneous, 2x/day  lisinopril (PRINIVIL) tablet, 5 mg, Oral, Daily  methylPREDNISolone sod succ (SOLU-MEDROL) 40 mg/mL injection, 40 mg, Intravenous, Q12H  metoprolol succinate (TOPROL-XL) 24 hr extended release tablet 75 mg, 75 mg, Oral, Daily  morphine 4 mg/mL injection, 4 mg, Intravenous, Q5 Min PRN  nitroGLYCERIN (NITROSTAT) sublingual tablet, 0.4 mg, Sublingual, Q5 Min PRN  NS 250 mL flush bag, , Intravenous, Q1H PRN  NS flush syringe, 10 mL, Intravenous, Q8HRS  NS flush syringe, 10 mL, Intravenous, Q1H PRN  prochlorperazine (COMPAZINE) 5 mg/mL injection, 10 mg, Intravenous, Q6H PRN      Family History:     Family Medical History:     Problem Relation (Age of Onset)    COPD Mother    Coronary Artery Disease Father    Diabetes Mother, Sister, Brother, Paternal Grandmother              Social History:     Social History     Tobacco Use   . Smoking status: Current Every Day Smoker     Packs/day: 1.00     Years: 20.00     Pack years: 20.00      Types: Cigarettes   . Smokeless tobacco: Never Used   Vaping Use   . Vaping Use: Never used   Substance Use Topics   . Alcohol use: No   .  Drug use: No       Physical Exam:     Filed Vitals:    06/26/19 2025 06/27/19 0100 06/27/19 0500 06/27/19 0753   BP: (!) 123/48 (!) 121/57 131/64 (!) 134/48   Pulse: 94 89 88 (!) 106   Resp: 20 20 20 20    Temp: 37 C (98.6 F) 37.1 C (98.7 F) 36.5 C (97.7 F) 36.4 C (97.5 F)   SpO2: 96% 94% 94% 91%     Body mass index is 35.23 kg/m.    Intake/Output Summary (Last 24 hours) at 06/27/2019 0754  Last data filed at 06/26/2019 1800  Gross per 24 hour   Intake 660 ml   Output -   Net 660 ml       General Apppearance: appropriate stated age. Alert, no acute distress.     Neck: supple, no carotid bruit, no JVD.   Respiratory: clear to auscultation bilaterally, no accessory muscle use, no wheezes.   Cardiovascular: normal rate, regular rhythm, s1 s2 normal, no murmur, no rubs, no gallops. Normal PMI.   Gastrointestinal: abdomen is soft, non-tender, and non-distended. Positive bowel sounds. No abdominal bruit.   Extremities: no clubbing, no cyanosis, no edema.   2+ bilateral radial pulses.   Skin: no rashes, no ecchymosis.   Neurologic: Alert, awake, oriented. Motor and sensory grossly intact. Speech output is normal.   Musculoskeletal: no joint tenderness, deformity.  Psychiatric: normal affect. Normal behavior.       Labs Reviewed:   BMP:   Recent Labs     06/26/19  0051 06/27/19  0356   SODIUM 131* 131*   POTASSIUM 3.6 4.6   CO2 34* 32*   BUN 10 15   CREATININE 0.69 0.61     Magnesium:   Recent Labs     06/27/19  0356   MAGNESIUM 2.3     CBC:   Recent Labs     06/26/19  0051 06/27/19  0356   WBC 19.7*  19.7 21.4*   HGB 14.1 12.5   HCT 42.6 37.8   PLTCNT 521* 477*     Hepatic Function:   Recent Labs     06/26/19  0051   AST 18   ALT 37*     Coags:   Recent Labs     06/27/19  0356   INR 1.64     Cardiac Markers:   Recent Labs     06/26/19  0051   BNP 66     TSH:    Recent Labs      06/26/19  0712   TSH 0.361*     Lipids: No results found for this or any previous visit (from the past 14000 hour(s)).    Cardiovascular Workup:     EKG (directly reviewed): sinus tachycardia    Telemetry (directly reviewed): NSR, no ectopy    ECHO:  Conclusions:  The left ventricular ejection fraction by visual assessment is estimated to be 55-60%.  There is a moderate pericardial effusion.  The pericardial effusion is anterior.  No overt signs of tamponade.  Findings  Left Ventricle: Severely dilated left ventricle. Eccentric hypertrophy. The left ventricular ejection fraction by visual assessment is  estimated to be 55-60%. Left ventricular systolic function is normal. No segmental/regional wall motion abnormalities identified. Left  ventricular diastolic function is indeterminate.  Right Ventricle: Normal right ventricular size. Normal right ventricular systolic function. RV systolic pressure could not be determined  due to incomplete tricuspid regurgitation envelope.  Left  Atrium: Mildly dilated left atrium.  Right Atrium: The right atrium is of normal size.  Mitral Valve: The mitral valve is normal. Trace mitral regurgitation present.  Tricuspid Valve: The tricuspid valve is not well visualized. No evidence of tricuspid regurgitation present.  Aortic Valve: The aortic valve is not well visualized. No Aortic valve stenosis. There is no evidence of aortic regurgitation.  Pulmonic Valve: The pulmonic valve is not well visualized. No evidence of pulmonic regurgitation.  Atrial Septum: The interatrial septum is not well visualized.  IVC/Hepatic Veins: The inferior vena cava was not visualized. IVC grossly normal in size.  Aorta: The aortic root is of normal size. The ascending aorta is normal in size.  Pericardium/Pleural space: There is a moderate pericardial effusion. The pericardial effusion is anterior. No overt signs of  tamponade.    Assessment:     Ms. Mica Ramdass is a 64 year old woman with PMH of HTN,  HLP, DM, admitted recently with new onset Afib, bilateral PNA and pericardial effusion. Currently in NSR. Will benefit from an ischemic evaluation and re-evaluation of effusion. Also, smoking cessation is a key to keeping her out of Afib.    Plan:     1. Afib RVR:  -Eliquis 5mg  po bid  -Keep lytes WNL K>4 and Mg>2  -Toprol XL 75mg  po daily  -Limited TTE pending to evaluate pericardial effusion  -Event monitor on discharge  -Outpatient nuclear stress test   -If pericardial effusion is stable, can discharge to home today and follow up with me, Dr. Julian Hy MD within 10 days at the [facility]    2. Bilateral PNA:  -Ceftriaxone 2gm IV q24 hours    3. COPD on home oxygen:  -Albuterol qid  -Atrovent qid  -Arformoterol BID  -Pulmicort 0.5mg  po bid  -Methyprednisolone 40mg  IV BID    4. DM:  -Glargine 30 units sq bid  -Monitor FS  -SSI    5. HTN:  -Lisinopril 5mg  po daily  -Toprol XL 75mg  po daily  -Lasix 20mg  po daily    6. Pericardial Effusion:  -Hemodynamically stable  -Recommend repeat TTE as outpatient as well to reevaluate resolution or progression.   -Recommend buprofen 800mg  po tid for two weeks and Colchicine 0.6mg  po bid for three months to treat any inflammatory component.      Thank you for consulting [facility] for the care of this patien.  Will sign off; please call if further question arise    Dr. Julian Hy MD  ABIM Board Certified In Cardiovascular Disease  ABIM Board Certified In Internal Medicine  [place] [facility], [address]  Ph: [phone]  Ph: [phone]  (cell): [phone]

## 2019-06-27 NOTE — Progress Notes (Signed)
[facility]   IP PROGRESS NOTE      [name], [name]  Date of Admission:  06/26/2019  Date of Birth:  [birth year]  Date of Service:  06/27/2019    Chief Complaint:  Shortness of breath  Subjective: [name] is a 64 y.o., White female with a history of tobacco abuse Afib and COPD who presented to the ED complaining of shortness of breath above her normal. The pt was just admitted here on 6/8-6/10for c/o SOB where she was found to be in Afib RVR (this was a new dx); imaging also revealed BL pneumonia and she was also noted to have a moderate pericardial effusion.she was started on [place] and BB for her Afib, lasix, and treated with 6 total days of azithromycin and steroids which she complted yesterday. She says she hasn't felt improved since discharge. She claims to be compliant with medications. She feels her cough has become worse and is more productive but she deneis any fever, peripheral edema, chest pain, vomiting,diarrhea, urinary symptoms or localized calf pain.  She was noted to be in sinus tach, rate fluctuating between 90-120's. She does admit to occasional palpitations, nothing significant.  She was on IV Cardizem which has been stopped today and she has been put on oral medications for that.  ROS:  10 or more systems reviewed.  Negative except as mentioned above.        Vital Signs:  Temp (24hrs) Max:37.1 C (98.7 F)      Temperature: 36.4 C (97.5 F)  BP (Non-Invasive): (!) 140/63  MAP (Non-Invasive): 81 mmHG  Heart Rate: 98  Respiratory Rate: 20  Pain Score (Numeric, Faces): 0  SpO2: 94 %    Current Medications:  acetaminophen (TYLENOL) tablet, 650 mg, Oral, Q6H PRN  albuterol (PROVENTIL) 2.5mg / 0.5 mL nebulizer solution, 2.5 mg, Nebulization, 4x/day   And  ipratropium (ATROVENT) 0.02% nebulizer solution, 0.5 mg, Nebulization, 4x/day  albuterol (PROVENTIL) 2.5mg / 0.5 mL nebulizer solution, 2.5 mg, Nebulization, Q4H PRN  apixaban (ELIQUIS) tablet, 5 mg, Oral, 2x/day  arformoterol (BROVANA) 15  mcg/2 mL nebulizer solution, 15 mcg, Nebulization, 2x/day  aspirin chewable tablet 81 mg, 81 mg, Oral, Daily  atorvastatin (LIPITOR) tablet, 40 mg, Oral, QPM  azithromycin (ZITHROMAX) 500 mg in NS 250 mL IVPB, 500 mg, Intravenous, Q24H  budesonide (PULMICORT RESPULES) 0.5 mg/2 mL nebulizer suspension, 0.5 mg, Nebulization, 2x/day  cefTRIAXone (ROCEPHIN) 2 g in NS 50 mL IVPB, 2 g, Intravenous, Q24H  SSIP insulin lispro (HUMALOG) 100 units/mL SubQ pen, 1-12 Units, Subcutaneous, 4x/day AC   And  dextrose 50% (0.5 g/mL) injection - syringe, 12.5 g, Intravenous, Q15 Min PRN  furosemide (LASIX) tablet, 20 mg, Oral, Daily  guaiFENesin (MUCINEX) extended release tablet - for cough (expectorant), 600 mg, Oral, 2x/day  insulin glargine 100 units/mL SubQ pen, 30 Units, Subcutaneous, 2x/day  lisinopril (PRINIVIL) tablet, 5 mg, Oral, Daily  methylPREDNISolone sod succ (SOLU-MEDROL) 40 mg/mL injection, 40 mg, Intravenous, Q12H  metoprolol succinate (TOPROL-XL) 24 hr extended release tablet 75 mg, 75 mg, Oral, Daily  morphine 4 mg/mL injection, 4 mg, Intravenous, Q5 Min PRN  nitroGLYCERIN (NITROSTAT) sublingual tablet, 0.4 mg, Sublingual, Q5 Min PRN  NS 250 mL flush bag, , Intravenous, Q1H PRN  NS flush syringe, 10 mL, Intravenous, Q8HRS  NS flush syringe, 10 mL, Intravenous, Q1H PRN  prochlorperazine (COMPAZINE) 5 mg/mL injection, 10 mg, Intravenous, Q6H PRN        Today's Physical Exam:  General: No acute distress.  Eyes: Pupils equal and round, reactive to light.   HENT:Head atraumatic and normocephalic   Neck: No JVD or thyromegaly or lymphadenopathy   Lungs: CTAB, non labored breathing, no rales or wheezing.    Cardiovascular: regular rate and rhythm, S1, S2 normal, no murmur,   Abdomen: Soft, non-tender, Bowel sounds normal, No hepatosplenomegaly   Extremities: extremities normal, atraumatic, no cyanosis   Skin: Skin warm and dry   Neurologic: Grossly normal   Psychiatric: Normal affect, behavior,         I/O:  I/O last 24  hours:      Intake/Output Summary (Last 24 hours) at 06/27/2019 1513  Last data filed at 06/27/2019 1300  Gross per 24 hour   Intake 1220 ml   Output -   Net 1220 ml     I/O current shift:  06/17 0700 - 06/17 1859  In: 720 [P.O.:720]  Out: -       Labs  Please indicate ordered or reviewed)  Reviewed:   Lab Results for Last 24 Hours:    Results for orders placed or performed during the hospital encounter of 06/26/19 (from the past 24 hour(s))   POC FINGERSTICK GLUCOSE - [facility] (RESULTS)   Result Value Ref Range    GLUCOSE, POC 388 (H) 60 - 100 mg/dl   POC FINGERSTICK GLUCOSE - [facility] (RESULTS)   Result Value Ref Range    GLUCOSE, POC 285 (H) 60 - 100 mg/dl   BASIC METABOLIC PANEL   Result Value Ref Range    SODIUM 131 (L) 136 - 145 mmol/L    POTASSIUM 4.6 3.5 - 5.1 mmol/L    CHLORIDE 91 (L) 96 - 111 mmol/L    CO2 TOTAL 32 (H) 23 - 31 mmol/L    ANION GAP 8 4 - 13 mmol/L    CALCIUM 8.4 (L) 8.8 - 10.2 mg/dL    GLUCOSE 248 (H) 65 - 125 mg/dL    BUN 15 8 - 25 mg/dL    CREATININE 0.61 0.60 - 1.05 mg/dL    BUN/CREA RATIO 25 (H) 6 - 22    ESTIMATED GFR >90 >=60 mL/min/BSA   PT/INR   Result Value Ref Range    PROTHROMBIN TIME 19.1 (H) 9.4 - 12.5 seconds    INR 1.64    MAGNESIUM   Result Value Ref Range    MAGNESIUM 2.3 1.8 - 2.6 mg/dL   CBC WITH DIFF   Result Value Ref Range    WBC 21.4 (H) 4.0 - 11.0 x10^3/uL    RBC 4.19 4.00 - 5.10 x10^6/uL    HGB 12.5 12.0 - 15.5 g/dL    HCT 37.8 36.0 - 45.0 %    MCV 90.3 82.0 - 97.0 fL    MCH 29.7 27.5 - 33.2 pg    MCHC 32.9 32.0 - 36.0 g/dL    RDW 14.0 11.0 - 16.0 %    PLATELETS 477 (H) 150 - 450 x10^3/uL    MPV 8.5 7.4 - 10.5 fL    NEUTROPHIL % 89 (H) 43 - 76 %    LYMPHOCYTE % 5 (L) 15 - 43 %    MONOCYTE % 6 5 - 12 %    EOSINOPHIL % 0 0 - 5 %    BASOPHIL % 0 0 - 3 %    NEUTROPHIL # 18.90 (H) 1.50 - 6.50 x10^3/uL    LYMPHOCYTE # 1.10 1.00 - 4.80 x10^3/uL    MONOCYTE # 1.30 (H) 0.20 - 0.90 x10^3/uL  EOSINOPHIL # 0.00 0.00 - 0.50 x10^3/uL    BASOPHIL # 0.00 0.00 - 0.10 x10^3/uL   POC  FINGERSTICK GLUCOSE - [facility] (RESULTS)   Result Value Ref Range    GLUCOSE, POC 268 (H) 60 - 100 mg/dl   POC FINGERSTICK GLUCOSE - [facility] (RESULTS)   Result Value Ref Range    GLUCOSE, POC 325 (H) 60 - 100 mg/dl           Radiology Tests (Please indicate ordered or reviewed)  EXAMINATION: XR AP MOBILE CHEST     EXAM DATE/TIME: 06/26/2019 1:06 AM    CLINICAL INDICATION: sob    COMPARISON: 06/18/2019    Quality of Exam: Adequate     PULMONARY: Lung appearance is normal.     CARDIOVASCULAR: Mild cardiomegaly.    CHEST WALL/SPINE: Normal appearance.    OTHER: None significant.      IMPRESSION:  Normal appearance of lungs. Mild cardiomegaly.    Problem List:  Active Hospital Problems   (*Primary Problem)    Diagnosis   . COPD exacerbation (CMS HCC)       Assessment/Plan:   [name] is a 64 y.o., White female who presents with the following:    1. COPD exacerbation   --Clinically she is having worsening productive cough and labs reveal new leukocytosis and band so will check a sputum culture and treat her with cefepime 2 g IV b.i.d.  --scheduled duonbes, budesonide and formoterol nebs  --IV Solu-Medrol 40 mg q.d. for now  --she is stable on 3 L right now, which is which she is on at home    2. History of AFib  As per cardiology recommendations;   Cardizem drip has been stopped and she has been started on;  -Eliquis 5mg  po bid  -Toprol XL 75mg  po daily  -TTE pending  -Event monitor on discharge  -Outpatient nuclear stress test  --telemetry    3. Chronic diastolic heart failure  no obvious acute exacerbtaion here really.  Recent echo with preserved EF noted in HPI   --continue recently started lasix, ace and beta-blocker  --monitor fluid status closely    4. Tobacco abuse   --pt counseled on cessation     5. Type 2 diabetes  --increase insulin glargine to 40 units subcutaneous b.i.d. because of her uncontrolled diabetes. Add ssi coverage with Humalog      DVT PPx: Eliquis   Code Status:  Full COde      DVT  PPX :    Code Status:     Disposition :     Evalee Jefferson, MD    Portions of this note may be dictated using voice recognition software or a dictation service. Variances in spelling and vocabulary are possible and unintentional. Not all errors are caught/corrected. Please notify the [facility] if any discrepancies are noted or if the meaning of any statement is not clear.

## 2019-06-28 LAB — CBC WITH DIFF
BASOPHIL #: 0.1 10*3/uL (ref 0.00–0.10)
BASOPHIL %: 0 % (ref 0–3)
EOSINOPHIL #: 0 10*3/uL (ref 0.00–0.50)
EOSINOPHIL %: 0 % (ref 0–5)
HCT: 37.9 % (ref 36.0–45.0)
HGB: 12.3 g/dL (ref 12.0–15.5)
LYMPHOCYTE #: 3 10*3/uL (ref 1.00–4.80)
LYMPHOCYTE %: 14 % — ABNORMAL LOW (ref 15–43)
MCH: 29.9 pg (ref 27.5–33.2)
MCHC: 32.5 g/dL (ref 32.0–36.0)
MCV: 91.9 fL (ref 82.0–97.0)
MONOCYTE #: 2.6 10*3/uL — ABNORMAL HIGH (ref 0.20–0.90)
MONOCYTE %: 12 % (ref 5–12)
MPV: 8 fL (ref 7.4–10.5)
NEUTROPHIL #: 16.4 10*3/uL — ABNORMAL HIGH (ref 1.50–6.50)
NEUTROPHIL %: 74 % (ref 43–76)
PLATELETS: 488 10*3/uL — ABNORMAL HIGH (ref 150–450)
RBC: 4.12 10*6/uL (ref 4.00–5.10)
RDW: 13.8 % (ref 11.0–16.0)
WBC: 22.1 10*3/uL — ABNORMAL HIGH (ref 4.0–11.0)

## 2019-06-28 LAB — BASIC METABOLIC PANEL
ANION GAP: 9 mmol/L (ref 4–13)
BUN/CREA RATIO: 21 (ref 6–22)
BUN: 12 mg/dL (ref 8–25)
CALCIUM: 8.6 mg/dL — ABNORMAL LOW (ref 8.8–10.2)
CHLORIDE: 98 mmol/L (ref 96–111)
CO2 TOTAL: 32 mmol/L — ABNORMAL HIGH (ref 23–31)
CREATININE: 0.56 mg/dL — ABNORMAL LOW (ref 0.60–1.05)
ESTIMATED GFR: >90 mL/min/BSA (ref >=60)
GLUCOSE: 133 mg/dL — ABNORMAL HIGH (ref 65–125)
POTASSIUM: 4.3 mmol/L (ref 3.5–5.1)
SODIUM: 139 mmol/L (ref 136–145)

## 2019-06-28 LAB — POC FINGERSTICK GLUCOSE - BMC/JMC (RESULTS)
GLUCOSE, POC: 67 mg/dl (ref 60–100)
GLUCOSE, POC: 113 mg/dl — ABNORMAL HIGH (ref 60–100)
GLUCOSE, POC: 285 mg/dl — ABNORMAL HIGH (ref 60–100)
GLUCOSE, POC: 248 mg/dl — ABNORMAL HIGH (ref 60–100)

## 2019-06-28 MED ORDER — LINEZOLID 600 MG/300 ML IV - EAST
600.00 mg | Freq: Two times a day (BID) | INTRAVENOUS | Status: DC
Start: 2019-06-28 — End: 2019-06-29
  Administered 2019-06-28: 600 mg via INTRAVENOUS
  Administered 2019-06-28: 0 mg via INTRAVENOUS
  Administered 2019-06-29: 600 mg via INTRAVENOUS
  Administered 2019-06-29: 0 mg via INTRAVENOUS
  Filled 2019-06-28 (×5): qty 300

## 2019-06-28 MED ORDER — INSULIN GLARGINE (U-100) 100 UNIT/ML (3 ML) SUBCUTANEOUS PEN
30.00 [IU] | PEN_INJECTOR | Freq: Two times a day (BID) | SUBCUTANEOUS | Status: DC
Start: 2019-06-28 — End: 2019-06-29
  Administered 2019-06-28: 30 [IU] via SUBCUTANEOUS
  Administered 2019-06-29: 0 [IU] via SUBCUTANEOUS

## 2019-06-28 NOTE — Care Management Notes (Signed)
Call from [facility], [place] who reported OBS LOC  Discussed same with Dr Tami Lin who approved case update.  Met with pt and pt spouse, discussed LOC change from IP to OBS, order  placed to chart and event manager updated . Cissy, CM updated to same.

## 2019-06-28 NOTE — Progress Notes (Signed)
[facility]   IP PROGRESS NOTE      Melinda, [name]  Date of Admission:  06/26/2019  Date of Birth:  Sep 06, 1955  Date of Service:  06/28/2019    Chief Complaint:  Shortness of breath  Subjective: [name] is a 64 y.o., White female with a history of tobacco abuse Afib and COPD  was admitted because of COPD exacerbation, atrial fibrillation with RVR and possible pneumonia.  Today her WBC count is 22.1 on repeat CBC which is higher than yesterday  ROS:  10 or more systems reviewed.  Negative except as mentioned above.        Vital Signs:  Temp (24hrs) Max:36.6 C (97.8 F)      Temperature: 36.4 C (97.5 F)  BP (Non-Invasive): (!) 121/54  MAP (Non-Invasive): 71 mmHG  Heart Rate: (!) 102  Respiratory Rate: 20  Pain Score (Numeric, Faces): 0  SpO2: 96 %    Current Medications:  acetaminophen (TYLENOL) tablet, 650 mg, Oral, Q6H PRN  albuterol (PROVENTIL) 2.5mg / 0.5 mL nebulizer solution, 2.5 mg, Nebulization, 4x/day   And  ipratropium (ATROVENT) 0.02% nebulizer solution, 0.5 mg, Nebulization, 4x/day  albuterol (PROVENTIL) 2.5mg / 0.5 mL nebulizer solution, 2.5 mg, Nebulization, Q4H PRN  apixaban (ELIQUIS) tablet, 5 mg, Oral, 2x/day  arformoterol (BROVANA) 15 mcg/2 mL nebulizer solution, 15 mcg, Nebulization, 2x/day  aspirin chewable tablet 81 mg, 81 mg, Oral, Daily  atorvastatin (LIPITOR) tablet, 40 mg, Oral, QPM  budesonide (PULMICORT RESPULES) 0.5 mg/2 mL nebulizer suspension, 0.5 mg, Nebulization, 2x/day  ceFEPime (MAXIPIME) 2 g in iso-osmotic 100 mL premix IVPB, 2 g, Intravenous, Q12H  SSIP insulin lispro (HUMALOG) 100 units/mL SubQ pen, 1-12 Units, Subcutaneous, 4x/day AC   And  dextrose 50% (0.5 g/mL) injection - syringe, 12.5 g, Intravenous, Q15 Min PRN  furosemide (LASIX) tablet, 20 mg, Oral, Daily  guaiFENesin (MUCINEX) extended release tablet - for cough (expectorant), 600 mg, Oral, 2x/day  insulin glargine 100 units/mL SubQ pen, 40 Units, Subcutaneous, 2x/day  linezolid (ZYVOX) 600 mg in  iso-osmotic 300 mL premix IVPB, 600 mg, Intravenous, Q12H  lisinopril (PRINIVIL) tablet, 5 mg, Oral, Daily  methylPREDNISolone sod succ (SOLU-MEDROL) 40 mg/mL injection, 40 mg, Intravenous, Daily  metoprolol succinate (TOPROL-XL) 24 hr extended release tablet 75 mg, 75 mg, Oral, Daily  morphine 4 mg/mL injection, 4 mg, Intravenous, Q5 Min PRN  nitroGLYCERIN (NITROSTAT) sublingual tablet, 0.4 mg, Sublingual, Q5 Min PRN  NS 250 mL flush bag, , Intravenous, Q1H PRN  NS flush syringe, 10 mL, Intravenous, Q8HRS  NS flush syringe, 10 mL, Intravenous, Q1H PRN  prochlorperazine (COMPAZINE) 5 mg/mL injection, 10 mg, Intravenous, Q6H PRN        Today's Physical Exam:  General: No acute distress.   Eyes: Pupils equal and round, reactive to light.   HENT:Head atraumatic and normocephalic   Neck: No JVD or thyromegaly or lymphadenopathy   Lungs:  Few bilateral scattered rhonchi   Cardiovascular: regular rate and rhythm, S1, S2 normal, no murmur,   Abdomen: Soft, non-tender, Bowel sounds normal, No hepatosplenomegaly   Extremities: extremities normal, atraumatic, no cyanosis   Skin: Skin warm and dry   Neurologic: Grossly normal   Psychiatric: Normal affect, behavior,         I/O:  I/O last 24 hours:      Intake/Output Summary (Last 24 hours) at 06/28/2019 1019  Last data filed at 06/27/2019 1900  Gross per 24 hour   Intake 1320 ml   Output -  Net 1320 ml     I/O current shift:  No intake/output data recorded.      Labs  Please indicate ordered or reviewed)  Reviewed:   Lab Results for Last 24 Hours:    Results for orders placed or performed during the hospital encounter of 06/26/19 (from the past 24 hour(s))   POC FINGERSTICK GLUCOSE - [facility] (RESULTS)   Result Value Ref Range    GLUCOSE, POC 325 (H) 60 - 100 mg/dl   POC FINGERSTICK GLUCOSE - [facility] (RESULTS)   Result Value Ref Range    GLUCOSE, POC 334 (H) 60 - 100 mg/dl   POC FINGERSTICK GLUCOSE - [facility] (RESULTS)   Result Value Ref Range    GLUCOSE, POC 273 (H) 60 - 100  mg/dl   BASIC METABOLIC PANEL - AM ONCE   Result Value Ref Range    SODIUM 139 136 - 145 mmol/L    POTASSIUM 4.3 3.5 - 5.1 mmol/L    CHLORIDE 98 96 - 111 mmol/L    CO2 TOTAL 32 (H) 23 - 31 mmol/L    ANION GAP 9 4 - 13 mmol/L    CALCIUM 8.6 (L) 8.8 - 10.2 mg/dL    GLUCOSE 133 (H) 65 - 125 mg/dL    BUN 12 8 - 25 mg/dL    CREATININE 0.56 (L) 0.60 - 1.05 mg/dL    BUN/CREA RATIO 21 6 - 22    ESTIMATED GFR >90 >=60 mL/min/BSA   CBC WITH DIFF   Result Value Ref Range    WBC 22.1 (H) 4.0 - 11.0 x10^3/uL    RBC 4.12 4.00 - 5.10 x10^6/uL    HGB 12.3 12.0 - 15.5 g/dL    HCT 37.9 36.0 - 45.0 %    MCV 91.9 82.0 - 97.0 fL    MCH 29.9 27.5 - 33.2 pg    MCHC 32.5 32.0 - 36.0 g/dL    RDW 13.8 11.0 - 16.0 %    PLATELETS 488 (H) 150 - 450 x10^3/uL    MPV 8.0 7.4 - 10.5 fL    NEUTROPHIL % 74 43 - 76 %    LYMPHOCYTE % 14 (L) 15 - 43 %    MONOCYTE % 12 5 - 12 %    EOSINOPHIL % 0 0 - 5 %    BASOPHIL % 0 0 - 3 %    NEUTROPHIL # 16.40 (H) 1.50 - 6.50 x10^3/uL    LYMPHOCYTE # 3.00 1.00 - 4.80 x10^3/uL    MONOCYTE # 2.60 (H) 0.20 - 0.90 x10^3/uL    EOSINOPHIL # 0.00 0.00 - 0.50 x10^3/uL    BASOPHIL # 0.10 0.00 - 0.10 x10^3/uL   POC FINGERSTICK GLUCOSE - [facility] (RESULTS)   Result Value Ref Range    GLUCOSE, POC 67 60 - 100 mg/dl           Radiology Tests (Please indicate ordered or reviewed)  EXAMINATION: XR AP MOBILE CHEST     EXAM DATE/TIME: 06/26/2019 1:06 AM    CLINICAL INDICATION: sob    COMPARISON: 06/18/2019    Quality of Exam: Adequate     PULMONARY: Lung appearance is normal.     CARDIOVASCULAR: Mild cardiomegaly.    CHEST WALL/SPINE: Normal appearance.    OTHER: None significant.      IMPRESSION:  Normal appearance of lungs. Mild cardiomegaly.    Problem List:  Active Hospital Problems   (*Primary Problem)    Diagnosis   . COPD exacerbation (CMS HCC)  Assessment/Plan:   [name] is a 64 y.o., White female who presents with the following:    1. COPD exacerbation   --Clinically she is having worsening productive cough  and labs reveal new leukocytosis.  Will add linezolid 600 mg p.o. b.i.d. blood cultures have been ordered.  Urinalysis and repeat chest x-rays have also been ordered to look for this worsening of leukocytosis source.    Continue with cefepime 2 g IV b.i.d.  --scheduled duonbes, budesonide and formoterol nebs  --IV Solu-Medrol 40 mg q.d. for now  --she is stable on 3 L right now, which is which she is on at home    2. History of AFib  -Eliquis 5mg  po bid  -Toprol XL 75mg  po daily  -Event monitor on discharge  -Outpatient nuclear stress test  --telemetry    3. Chronic diastolic heart failure  no obvious acute exacerbtaion here really.  Recent echo with preserved EF noted in HPI   --continue recently started lasix, ace and beta-blocker  --monitor fluid status closely    4. Tobacco abuse   --pt counseled on cessation     5. Type 2 diabetes  --decrease insulin glargine to 30 units subcutaneous b.i.d. because of her uncontrolled diabetes. Add ssi coverage with Humalog      DVT PPx: Eliquis     Code Status:  Full COde    Evalee Jefferson, MD    Portions of this note may be dictated using voice recognition software or a dictation service. Variances in spelling and vocabulary are possible and unintentional. Not all errors are caught/corrected. Please notify the [facility] if any discrepancies are noted or if the meaning of any statement is not clear.

## 2019-06-29 LAB — BASIC METABOLIC PANEL
ANION GAP: 7 mmol/L (ref 4–13)
BUN/CREA RATIO: 17 (ref 6–22)
BUN: 10 mg/dL (ref 8–25)
CALCIUM: 9.1 mg/dL (ref 8.8–10.2)
CHLORIDE: 96 mmol/L (ref 96–111)
CO2 TOTAL: 34 mmol/L — ABNORMAL HIGH (ref 23–31)
CREATININE: 0.59 mg/dL — ABNORMAL LOW (ref 0.60–1.05)
ESTIMATED GFR: >90 mL/min/BSA (ref >=60)
GLUCOSE: 172 mg/dL — ABNORMAL HIGH (ref 65–125)
POTASSIUM: 4.4 mmol/L (ref 3.5–5.1)
SODIUM: 137 mmol/L (ref 136–145)

## 2019-06-29 LAB — MANUAL DIFFERENTIAL
LYMPHOCYTE %: 25 % (ref 15–43)
LYMPHOCYTE ABSOLUTE: 4.83 10*3/uL — ABNORMAL HIGH (ref 1.00–4.80)
MONOCYTE %: 10 % (ref 5–12)
MONOCYTE ABSOLUTE: 1.93 10*3/uL — ABNORMAL HIGH (ref 0.20–0.90)
NEUTROPHIL %: 65 % (ref 43–76)
NEUTROPHIL ABSOLUTE: 12.55 10*3/uL — ABNORMAL HIGH (ref 1.50–6.50)
PLATELET ESTIMATE: INCREASED
RBC MORPHOLOGY COMMENT: NORMAL
WBC MORPHOLOGY COMMENT: NORMAL
WBC: 19.3 10*3/uL

## 2019-06-29 LAB — CBC WITH DIFF
HCT: 39.1 % (ref 36.0–45.0)
HGB: 12.9 g/dL (ref 12.0–15.5)
MCH: 30.4 pg (ref 27.5–33.2)
MCHC: 32.9 g/dL (ref 32.0–36.0)
MCV: 92.3 fL (ref 82.0–97.0)
MPV: 7.8 fL (ref 7.4–10.5)
PLATELETS: 498 10*3/uL — ABNORMAL HIGH (ref 150–450)
RBC: 4.24 10*6/uL (ref 4.00–5.10)
RDW: 14 % (ref 11.0–16.0)
WBC: 19.3 10*3/uL — ABNORMAL HIGH (ref 4.0–11.0)

## 2019-06-29 LAB — POC FINGERSTICK GLUCOSE - BMC/JMC (RESULTS): GLUCOSE, POC: 90 mg/dl (ref 60–100)

## 2019-06-29 NOTE — Nurses Notes (Deleted)
SBAR received from [facility], Therapist, sports and walking rounds completed. Pateint resting comfortably in bed w/ Mother at the bedside. Bed in low locked position, bed alarm and [place] alarms activated. Patient on HI Flo NC at 40 L and 40 % w/ RT at the bedside, no adjustments made to Respiratory support at this time. Bedside table and call bell within reach of Pateint's mother. Will continue to monitor Patient.

## 2019-06-29 NOTE — Care Plan (Signed)
Problem: Adult Inpatient Plan of Care  Goal: Plan of Care Review  Outcome: Unable to achieve outcome at discharge (see discharge plan/notes)  Goal: Patient-Specific Goal (Individualized)  Outcome: Unable to achieve outcome at discharge (see discharge plan/notes)  Goal: Absence of Hospital-Acquired Illness or Injury  Outcome: Unable to achieve outcome at discharge (see discharge plan/notes)  Goal: Optimal Comfort and Wellbeing  Outcome: Unable to achieve outcome at discharge (see discharge plan/notes)  Goal: Rounds/Family Conference  Outcome: Unable to achieve outcome at discharge (see discharge plan/notes)

## 2019-06-29 NOTE — Discharge Summary (Signed)
[facility]  [place], [place] [address]    DISCHARGE SUMMARY      PATIENT NAME:  [name], [name]  MRN:  [id number]  DOB:  03-18-1955    ADMISSION DATE:  06/26/2019  DISCHARGE DATE:  06/29/2019    ATTENDING PHYSICIAN: Evalee Jefferson, MD  PRIMARY CARE PHYSICIAN: Hubbard Robinson, DO     ADMISSION DIAGNOSIS: <principal problem not specified>  DISCHARGE DIAGNOSIS:  COPD exacerbation, atrial fibrillation with rapid ventricular rate.  Rule out pneumonia  Active Hospital Problems    Diagnosis Date Noted   . COPD exacerbation (CMS [facility]) [J44.1] 06/26/2019      Resolved Hospital Problems   No resolved problems to display.     Active Non-Hospital Problems    Diagnosis Date Noted   . Atrial fibrillation with rapid ventricular response (CMS HCC) 06/18/2019   . Acute hypoxemic respiratory failure (CMS HCC) 12/04/2018   . Sepsis 10/27/2018   . CAP (community acquired pneumonia) 10/26/2018   . Acute exacerbation of chronic obstructive pulmonary disease (COPD) (CMS HCC) 03/27/2018   . COPD with acute exacerbation (CMS HCC) 05/23/2017   . Tobacco use disorder 05/22/2017   . Acute respiratory failure (CMS HCC) 05/21/2017   . Non-compliance 03/27/2017   . 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 Diabetes (CMS [place]) 11/18/2015   . Obesity (BMI 35.0-39.9 without comorbidity) 10/08/2012      DISCHARGE MEDICATIONS:     Current Discharge Medication List      CONTINUE these medications - NO CHANGES were made during your visit.      Details   Advair Diskus 500-50 mcg/dose Disk with Device oral diskus inhaler  Generic drug: fluticasone propion-salmeteroL   INHALE 1 DOSE BY MOUTH TWICE DAILY  Qty: 60 Each  Refills: 5     * albuterol 2.5 mg/0.5 mL Solution for Nebulization  Commonly known as: PROVENTIL   USE 1 VIAL IN NEBULIZER EVERY 4 HOURS  Qty: 30 Each  Refills: 2     * albuterol sulfate 2.5 mg /3 mL (0.083 %) Solution for Nebulization  Commonly known as: PROVENTIL   USE 1 VIAL IN NEBULIZER 4 TIMES DAILY  Qty:  150 mL  Refills: 3     apixaban 5 mg Tablet  Commonly known as: ELIQUIS   5 mg, Oral, EVERY 12 HOURS  Qty: 60 Tablet  Refills: 0     aspirin 81 mg Tablet, Chewable   81 mg, Oral, DAILY  Refills: 0     atorvastatin 40 mg Tablet  Commonly known as: LIPITOR   Take 1 tablet by mouth once daily  Qty: 30 Tab  Refills: 5     * Atrovent HFA 17 mcg/actuation HFA Aerosol Inhaler oral inhaler  Generic drug: ipratropium bromide   INHALE 2 PUFFS BY MOUTH 4 TIMES DAILY  Qty: 13 Each  Refills: 5     * ipratropium 0.02 % Solution  Commonly known as: ATROVENT   0.5 mg, Nebulization, EVERY 4 HOURS  Qty: 30 Each  Refills: 2     Breo Ellipta 200-25 mcg/dose Disk with Device  Generic drug: fluticasone furoate-vilanteroL   1 INHALATION, Inhalation, DAILY  Qty: 60 Each  Refills: 1     furosemide 20 mg Tablet  Commonly known as: LASIX   Take 1/2 (one-half) tablet by mouth once daily  Qty: 45 Tab  Refills: 1     Lantus Solostar U-100 Insulin 100 unit/mL Insulin Pen  Generic drug: insulin glargine  30 Units, Subcutaneous, 2 TIMES DAILY  Qty: 30 mL  Refills: 1     lisinopriL 5 mg Tablet  Commonly known as: PRINIVIL   Take 1 tablet by mouth once daily  Qty: 30 Tab  Refills: 3     metoprolol succinate 50 mg Tablet Sustained Release 24 hr  Commonly known as: TOPROL-XL   50 mg, Oral, DAILY  Qty: 30 Tablet  Refills: 0         * This list has 4 medication(s) that are the same as other medications prescribed for you. Read the directions carefully, and ask your doctor or other care provider to review them with you.              DISCHARGE INSTRUCTIONS:   No discharge procedures on file.  REASON FOR HOSPITALIZATION AND HOSPITAL COURSE:  [name] is a 64 y.o., White female with a history of tobacco abuse Afib and COPD who presented to the ED complaining of shortness of breath above her normal. The pt was just admitted here on 6/8-6/10for c/o SOB where she was found to be in Afib RVR (this was a new dx); imaging also revealed BL pneumonia and she  was also noted to have a moderate pericardial effusion.she was started on [place] and BB for her Afib, lasix, and treated with 6 total days of azithromycin and steroids which she complted yesterday. She says she hasn't felt improved since discharge. She claims to be compliant with medications. She feels her cough has become worse and is more productive but she deneis any fever, peripheral edema, chest pain, vomiting,diarrhea, urinary symptoms or localized calf pain.  So this time she was admitted with COPD exacerbation with possible pneumonia and atrial fibrillation with rapid ventricular rate.  She was treated with IV cefepime 2 g b.i.d. during this admission.  Heart rate was controlled with Cardizem.  Yesterday her WBCs went up to 22,000 and today WBC count is 19,000. Her shortness of breath has improved.  Patient states that she wants to go home.  She was advised that with significant increase in white blood cells, she still has some underlying infection and she should not leave today and rather be treated with IV antibiotics for few more days.  Patient refused this option and decided to sign out against medical advise.  She was explained about the risks of leaving the hospital in her current medical condition and this point.  Give her script of levofloxacin 750 mg p.o. q.d. for 10 days after she signed out AMA.        Physical exam :  GENERAL: The patient is alert and oriented to time, place and person.   HEENT: Normocephalic, anicteric. No pallor. PERRL.   NECK: Supple. No jugular venous distention.   LUNGS:  Few scattered rhonchi  HEART: Both first and second sounds are audible, regular sinus rhythm. No murmur.  ABDOMEN: Soft, bowel sounds are present, nontender, no hepatosplenomegaly.   EXTREMITIES: Peripheral pulses are present.   CENTRAL NERVOUS SYSTEM: No focal deficit, no cranial nerve palsy.   SKIN: No rashes or bruises.   MUSCULOSKELETAL SYSTEM: No deformity or swelling.   PSYCHIATRIC: No anxiety or  depression.   HEMATOLOGIC: No ecchymosis, petechia or hematoma.      COURSE IN HOSPITAL: As above.  Please see H&P for admission details and progress notes for hospital course.    CONDITION ON DISCHARGE: Oriented    DISCHARGE DISPOSITION:  Home discharge     cc: Primary  Care Physician:  Hubbard Robinson, DO  [address]  [place] [address]     [id number] Physician:  No referring provider defined for this encounter.   Evalee Jefferson, MD

## 2019-07-01 ENCOUNTER — Telehealth (HOSPITAL_BASED_OUTPATIENT_CLINIC_OR_DEPARTMENT_OTHER): Payer: Self-pay

## 2019-07-01 NOTE — Telephone Encounter (Signed)
Transition of Care Contact  Discharge Date: 06/29/2019  Transition Facility Type--Hospital (Inpatient or Observation)  [facility]  Interactive Contact(s): Completed or attempted contact indicated by Date/Time  Completed Contact  First Attempt--07/01/2019  1:18 PM  Second Attempt  Third Attempt  Contact Method(s)-- Patient/Caregiver Telephone  Clinical Staff Name/Role who contacted--Brayant Dorr Carlton Adam RN   Transition Note:   07/01/2019: TCM outreach attempted x 1. Left message. Shelly Rubenstein, RN  07/01/2019, 13:18       Further information may be documented in relevant telephone or outreach encounter.

## 2019-07-02 LAB — ECG 12-LEAD
Atrial Rate: 52 {beats}/min
Calculated R Axis: 71 degrees
Calculated T Axis: 99 degrees
QRS Duration: 104 ms
QT Interval: 494 ms
QTC Calculation: 661 ms
Ventricular rate: 108 {beats}/min

## 2019-07-02 LAB — ADULT ROUTINE BLOOD CULTURE, SET OF 2 BOTTLES (BACTERIA AND YEAST): BLOOD CULTURE, ROUTINE: NO GROWTH

## 2019-07-02 NOTE — Telephone Encounter (Signed)
Transition of Care Contact  Discharge Date: 06/29/2019  Transition Facility Type--Hospital (Inpatient or Observation)  [facility]  Interactive Contact(s): Completed or attempted contact indicated by Date/Time  Completed Contact  First Attempt--07/01/2019  1:18 PM  Second Attempt--07/02/2019  2:16 PM  Third Attempt  Contact Method(s)-- Patient/Caregiver Telephone  Clinical Staff Name/Role who contacted--Heather Carlton Adam RN   Transition Note:   07/01/2019: TCM outreach attempted x 1. Left message. Shelly Rubenstein, RN  07/01/2019, 13:18    07/02/2019:  Second TCM outreach attempted.  Left VM.  Kennieth Rad, RN        Further information may be documented in relevant telephone or outreach encounter.

## 2019-07-08 ENCOUNTER — Other Ambulatory Visit: Payer: Self-pay

## 2019-07-08 ENCOUNTER — Ambulatory Visit (INDEPENDENT_AMBULATORY_CARE_PROVIDER_SITE_OTHER): Payer: Medicaid Other | Admitting: Family Medicine

## 2019-07-08 VITALS — BP 130/64 | HR 112 | Temp 98.1°F | Resp 24 | Ht 63.0 in | Wt 192.0 lb

## 2019-07-08 DIAGNOSIS — R0602 Shortness of breath: Secondary | ICD-10-CM

## 2019-07-08 DIAGNOSIS — J189 Pneumonia, unspecified organism: Secondary | ICD-10-CM

## 2019-07-08 DIAGNOSIS — I313 Pericardial effusion (noninflammatory): Secondary | ICD-10-CM

## 2019-07-08 DIAGNOSIS — J441 Chronic obstructive pulmonary disease with (acute) exacerbation: Secondary | ICD-10-CM

## 2019-07-08 DIAGNOSIS — F17210 Nicotine dependence, cigarettes, uncomplicated: Secondary | ICD-10-CM

## 2019-07-08 DIAGNOSIS — I4891 Unspecified atrial fibrillation: Secondary | ICD-10-CM

## 2019-07-08 DIAGNOSIS — J440 Chronic obstructive pulmonary disease with acute lower respiratory infection: Secondary | ICD-10-CM

## 2019-07-08 DIAGNOSIS — I509 Heart failure, unspecified: Secondary | ICD-10-CM

## 2019-07-08 DIAGNOSIS — Z09 Encounter for follow-up examination after completed treatment for conditions other than malignant neoplasm: Secondary | ICD-10-CM

## 2019-07-08 MED ORDER — FUROSEMIDE 20 MG TABLET
20.00 mg | ORAL_TABLET | Freq: Every day | ORAL | 1 refills | Status: AC
Start: 2019-07-08 — End: 2019-08-07

## 2019-07-08 MED ORDER — CEPHALEXIN 500 MG CAPSULE
500.00 mg | ORAL_CAPSULE | Freq: Four times a day (QID) | ORAL | 0 refills | Status: DC
Start: 2019-07-08 — End: 2019-07-11

## 2019-07-08 NOTE — Nursing Note (Signed)
Chief Complaint:   [facility] Follow Up         Functional Health Screen  Functional Health Screening:   Patient is under 18: No  Have you had a recent unexplained weight loss or gain?: No  Because we are aware of abuse and domestic violence today, we ask all patients: Are you being hurt, hit, or frightened by anyone at your home or in your life?: No  Do you have any basic needs within your home that are not being met? (such as Food, Shelter, Games developer, Transportation): No  Patient is under 18 and therefore has no Advance Directives: No  Patient has: No Advance  Patient has Advance Directive: No  Patient offered: Refused Packet  Screening unable to be completed: No       BP 130/64   Pulse (!) 112   Temp 36.7 C (98.1 F)   Resp (!) 24   Ht 1.6 m ('5\' 3"' )   Wt 87.1 kg (192 lb)   LMP  (LMP Unknown)   SpO2 96%   BMI 34.01 kg/m       Social History     Tobacco Use   Smoking Status Current Every Day Smoker   . Packs/day: 1.00   . Years: 20.00   . Pack years: 20.00   . Types: Cigarettes   Smokeless Tobacco Never Used     Patient Health Rating           Depression Screening  PHQ Questionnaire     Allergies:  Allergies   Allergen Reactions   . Codeine Nausea/ Vomiting     Sick to stomach   . Hydrocodone Nausea/ Vomiting   . Metformin Diarrhea   . Oxycodone Nausea/ Vomiting     Medication History  Reviewed for OTC medication and any new medications, provider will review medication history  Results through Enter/Edit  No results found for this or any previous visit (from the past 24 hour(s)).  POCT Results  Care Team  Patient Care Team:  Hubbard Robinson, DO as PCP - General ([place])  Immunizations - last 24 hours     None        Maye Hides, LPN  01/20/5518, 80:22

## 2019-07-09 NOTE — Progress Notes (Signed)
FAMILY MEDICINE, [place]  [place]  [place] [address]    Transitional Care Management Visit    Name: [name]  MRN: [id number]    Date: 07/08/2019  Age: 64 y.o.      Transition of Care Contact  Discharge Date: 06/29/2019  Transition Facility Type--Hospital (Inpatient or Observation)  [facility]  Interactive Contact(s): Completed or attempted contact indicated by Date/Time  Completed Contact  First Attempt--07/01/2019  1:18 PM  Second Attempt--07/02/2019  2:16 PM  Third Attempt  Contact Method(s)-- Patient/Caregiver Telephone  Clinical Staff Name/Role who contacted--Heather Carlton Adam RN   Transition Note:   07/01/2019: TCM outreach attempted x 1. Left message. Shelly Rubenstein, RN  07/01/2019, 13:18    07/02/2019:  Second TCM outreach attempted.  Left VM.  Kennieth Rad, RN        Further information may be documented in relevant telephone or outreach encounter.      Chief Complaint: Hospital Follow Up and Hospital Discharge Transition    History of Present Illness:  [name] is a 64 y.o. female is seen for transitional care management.  Follow up for pneumonia, atrial fibrillation and moderate pericardial effusion.  She ended up signing out AMA with this recent visit due to feeling trapped.  She states she feels better except she has a lot of swelling in her legs.  States urination is less.  She is taking all of her medications as prescribed.  She has cut back on smoking to 1/2 ppd.  Her feet are weeping due to the swelling.     Past Medical History:  She has a past medical history of COPD (chronic obstructive pulmonary disease) (CMS [facility]), Degenerative joint disease involving multiple joints, Diabetes mellitus (CMS [place]), HTN (hypertension), Smoker, Supplemental oxygen dependent, and Wears glasses. She also has no past medical history of Cancer (CMS [place]), Congestive heart failure (CMS HCC), Convulsions (CMS HCC), CVA (cerebrovascular accident) (CMS [facility]), Thyroid  disease, or Wears dentures.    Past Surgical History:  She has a past surgical history that includes hx carpal tunnel release.    Problem List:  She has Obesity (BMI 35.0-39.9 without comorbidity); Diabetes (CMS [place]); Hypertension due to endocrine disorder; Non-compliance; Acute respiratory failure (CMS HCC); Tobacco use disorder; COPD with acute exacerbation (CMS HCC); Acute exacerbation of chronic obstructive pulmonary disease (COPD) (CMS HCC); CAP (community acquired pneumonia); Sepsis; Acute hypoxemic respiratory failure (CMS HCC); Atrial fibrillation with rapid ventricular response (CMS HCC); and COPD exacerbation (CMS HCC) on their problem list.    Medications:  .  Advair Diskus  .  albuterol  .  albuterol sulfate  .  apixaban  .  aspirin  .  atorvastatin  .  Atrovent HFA  .  cephalexin  .  cephalexin  .  furosemide  .  Lantus Solostar U-100 Insulin  .  ipratropium  .  lisinopriL  .  metoprolol succinate     Review of Systems:  Review of Systems   Pertinent items are noted in HPI. All pertinent positives and negatives noted in the HPI  Constitutional: No recent illness, no fever, no chills, no night sweats, no weight loss.  Neuro: No dizziness, No lightheadedness, No syncope, no hx of seizures  Eyes: No blurring of vision or diplopia.  ENT:No sore throat. No dysphagia, No odynophagia, No hearing loss, No tinnitus, No rhinorrhea, No sinus pains.  Respiratory: Mild SOB  Cardiovascular: no chest pain  No palpitation, No exertional dyspnea, No claudications, No  pedal edema.  Muskuloskeletal: No myalgias, No arthralgias.  GI.no abdominal pain,nausea,vomiting,diarrhea.  GU. no dysuria,frequency of urination.  Skin: + pedal edema.     Exam:   Vitals:    07/08/19 0902   BP: 130/64   Pulse: (!) 112   Resp: (!) 24   Temp: 36.7 C (98.1 F)   SpO2: 96%   Weight: 87.1 kg (192 lb)   Height: 1.6 m (5\' 3" )   BMI: 34.08     Physical Exam  Constitutional:       Appearance: Normal appearance.   HENT:      Head: Normocephalic  and atraumatic.      Mouth/Throat:      Mouth: Mucous membranes are moist.   Cardiovascular:      Rate and Rhythm: Normal rate. Rhythm irregular.      Pulses: Normal pulses.      Heart sounds: Normal heart sounds. No murmur heard.     Pulmonary:      Effort: Pulmonary effort is normal. No respiratory distress.      Breath sounds: Normal breath sounds.   Abdominal:      General: Abdomen is flat.      Palpations: Abdomen is soft.   Musculoskeletal:      Right lower leg: Edema present.      Left lower leg: Edema present.   Skin:     Findings: Erythema present.   Neurological:      General: No focal deficit present.      Mental Status: She is alert and oriented to person, place, and time.   Psychiatric:         Mood and Affect: Mood normal.         Data Reviewed:   Hospital discharge noted.     Assessment/Plan:      ICD-10-CM    1. Congestive heart failure, unspecified HF chronicity, unspecified heart failure type (CMS HCC)  I50.9 furosemide (LASIX) 20 mg Oral Tablet     DME - COMPRESSION STOCKINGS   2. SOB (shortness of breath)  R06.02 POCT PULSE OXIMETRY, SPOT (AMB)     Orders Placed This Encounter   . POCT PULSE OXIMETRY, SPOT (AMB)   . cephalexin (KEFLEX) 500 mg Oral Capsule   . furosemide (LASIX) 20 mg Oral Tablet   . cephalexin (KEFLEX) 500 mg Oral Capsule   . DME - COMPRESSION STOCKINGS     1.  New onset atrial fibrillation/moderate pericardial effusion/COPD exacerbation/Pneumonia - Has a lot of lower extremity swelling, signed out AMA from the hospital, finished antibiotic, feeling better.  Will increase Lasix to 40 mg daily for five days.  Will see back in 3 weeks.  Compression stockings ordered.  Continued to encourage to quit smoking, also discussed better glycemic control and compliance with medications.     Hubbard Robinson, DO  [facility] OFFICE BUILDING  FAMILY MEDICINE, [place]  [employer]  [place] [address]  [phone]

## 2019-07-10 ENCOUNTER — Telehealth (HOSPITAL_BASED_OUTPATIENT_CLINIC_OR_DEPARTMENT_OTHER): Payer: Self-pay | Admitting: Family Medicine

## 2019-07-10 NOTE — Telephone Encounter (Signed)
Pt called and advised to use lotion on just the dry skin areas.  Pt advised its ok to soak her feet in warm but not hot water.  Thresa Ross, LPN  09/19/3110, 16:24

## 2019-07-10 NOTE — Telephone Encounter (Signed)
Wants a nurse to call her in regards to her dry skin on her feet and legs. [name]  07/10/2019, 13:09

## 2019-07-11 ENCOUNTER — Inpatient Hospital Stay (HOSPITAL_COMMUNITY): Payer: Medicaid Other | Admitting: Internal Medicine

## 2019-07-11 ENCOUNTER — Emergency Department (HOSPITAL_COMMUNITY): Payer: Medicaid Other

## 2019-07-11 ENCOUNTER — Encounter (HOSPITAL_COMMUNITY): Payer: Self-pay

## 2019-07-11 ENCOUNTER — Other Ambulatory Visit: Payer: Self-pay

## 2019-07-11 ENCOUNTER — Inpatient Hospital Stay
Admission: EM | Admit: 2019-07-11 | Discharge: 2019-07-14 | DRG: 872 | Disposition: A | Discharge status: Home / Self Care | Payer: Medicaid Other | Attending: Internal Medicine | Admitting: Internal Medicine

## 2019-07-11 DIAGNOSIS — M79606 Pain in leg, unspecified: Secondary | ICD-10-CM

## 2019-07-11 DIAGNOSIS — I110 Hypertensive heart disease with heart failure: Secondary | ICD-10-CM | POA: Diagnosis present

## 2019-07-11 DIAGNOSIS — F17200 Nicotine dependence, unspecified, uncomplicated: Secondary | ICD-10-CM | POA: Diagnosis present

## 2019-07-11 DIAGNOSIS — Z7951 Long term (current) use of inhaled steroids: Secondary | ICD-10-CM

## 2019-07-11 DIAGNOSIS — L03116 Cellulitis of left lower limb: Secondary | ICD-10-CM | POA: Diagnosis present

## 2019-07-11 DIAGNOSIS — I5030 Unspecified diastolic (congestive) heart failure: Secondary | ICD-10-CM | POA: Diagnosis present

## 2019-07-11 DIAGNOSIS — E1165 Type 2 diabetes mellitus with hyperglycemia: Secondary | ICD-10-CM | POA: Diagnosis present

## 2019-07-11 DIAGNOSIS — I4891 Unspecified atrial fibrillation: Secondary | ICD-10-CM

## 2019-07-11 DIAGNOSIS — Z7901 Long term (current) use of anticoagulants: Secondary | ICD-10-CM

## 2019-07-11 DIAGNOSIS — Z9981 Dependence on supplemental oxygen: Secondary | ICD-10-CM

## 2019-07-11 DIAGNOSIS — J449 Chronic obstructive pulmonary disease, unspecified: Secondary | ICD-10-CM | POA: Diagnosis present

## 2019-07-11 DIAGNOSIS — I480 Paroxysmal atrial fibrillation: Secondary | ICD-10-CM | POA: Diagnosis present

## 2019-07-11 DIAGNOSIS — E871 Hypo-osmolality and hyponatremia: Secondary | ICD-10-CM | POA: Diagnosis present

## 2019-07-11 DIAGNOSIS — Z794 Long term (current) use of insulin: Secondary | ICD-10-CM

## 2019-07-11 DIAGNOSIS — E876 Hypokalemia: Secondary | ICD-10-CM | POA: Diagnosis present

## 2019-07-11 DIAGNOSIS — Z7982 Long term (current) use of aspirin: Secondary | ICD-10-CM

## 2019-07-11 DIAGNOSIS — Z79899 Other long term (current) drug therapy: Secondary | ICD-10-CM

## 2019-07-11 DIAGNOSIS — F17210 Nicotine dependence, cigarettes, uncomplicated: Secondary | ICD-10-CM | POA: Diagnosis present

## 2019-07-11 DIAGNOSIS — R21 Rash and other nonspecific skin eruption: Secondary | ICD-10-CM | POA: Diagnosis present

## 2019-07-11 DIAGNOSIS — Z20822 Contact with and (suspected) exposure to covid-19: Secondary | ICD-10-CM | POA: Diagnosis present

## 2019-07-11 DIAGNOSIS — L0390 Cellulitis, unspecified: Principal | ICD-10-CM | POA: Diagnosis present

## 2019-07-11 DIAGNOSIS — L03115 Cellulitis of right lower limb: Secondary | ICD-10-CM | POA: Diagnosis present

## 2019-07-11 DIAGNOSIS — J9611 Chronic respiratory failure with hypoxia: Secondary | ICD-10-CM | POA: Diagnosis present

## 2019-07-11 DIAGNOSIS — A419 Sepsis, unspecified organism: Principal | ICD-10-CM | POA: Diagnosis present

## 2019-07-11 LAB — URINALYSIS, MACROSCOPIC
BILIRUBIN: NEGATIVE mg/dL
BLOOD: NEGATIVE mg/dL
GLUCOSE: 500 mg/dL — AB
KETONES: NEGATIVE mg/dL
NITRITE: NEGATIVE
PH: 6.5 (ref <8.0)
PROTEIN: NEGATIVE mg/dL
SPECIFIC GRAVITY: 1.01 (ref <1.022)
UROBILINOGEN: >=8 mg/dL — AB (ref <=2.0)

## 2019-07-11 LAB — CBC WITH DIFF
BASOPHIL #: 0.1 10*3/uL (ref 0.00–0.10)
BASOPHIL %: 1 % (ref 0–3)
EOSINOPHIL #: 0.2 10*3/uL (ref 0.00–0.50)
EOSINOPHIL %: 3 % (ref 0–5)
HCT: 35.3 % — ABNORMAL LOW (ref 36.0–45.0)
HGB: 11.7 g/dL — ABNORMAL LOW (ref 12.0–15.5)
LYMPHOCYTE #: 1.2 10*3/uL (ref 1.00–4.80)
LYMPHOCYTE %: 14 % — ABNORMAL LOW (ref 15–43)
MCH: 29.7 pg (ref 27.5–33.2)
MCHC: 33.1 g/dL (ref 32.0–36.0)
MCV: 90 fL (ref 82.0–97.0)
MONOCYTE #: 1.4 10*3/uL — ABNORMAL HIGH (ref 0.20–0.90)
MONOCYTE %: 16 % — ABNORMAL HIGH (ref 5–12)
MPV: 8 fL (ref 7.4–10.5)
NEUTROPHIL #: 5.8 10*3/uL (ref 1.50–6.50)
NEUTROPHIL %: 67 % (ref 43–76)
PLATELETS: 312 10*3/uL (ref 150–450)
RBC: 3.92 10*6/uL — ABNORMAL LOW (ref 4.00–5.10)
RDW: 14.1 % (ref 11.0–16.0)
WBC: 8.7 10*3/uL (ref 4.0–11.0)

## 2019-07-11 LAB — GOLD TOP TUBE

## 2019-07-11 LAB — C-REACTIVE PROTEIN(CRP),INFLAMMATION: CRP INFLAMMATION: 126.6 mg/L — ABNORMAL HIGH (ref <8.0)

## 2019-07-11 LAB — URINALYSIS, MICROSCOPIC

## 2019-07-11 LAB — LACTIC ACID LEVEL W/ REFLEX FOR LEVEL >2.0: LACTIC ACID: 1.8 mmol/L (ref 0.5–2.2)

## 2019-07-11 LAB — PTT (PARTIAL THROMBOPLASTIN TIME): APTT: 39.7 s — ABNORMAL HIGH (ref 25.1–36.5)

## 2019-07-11 LAB — COMPREHENSIVE METABOLIC PANEL, NON-FASTING
ALBUMIN: 2.4 g/dL — ABNORMAL LOW (ref 3.4–4.8)
ALKALINE PHOSPHATASE: 85 U/L (ref 50–130)
ALT (SGPT): 9 U/L (ref 8–22)
ANION GAP: 9 mmol/L (ref 4–13)
AST (SGOT): 9 U/L (ref 8–45)
BILIRUBIN TOTAL: 0.9 mg/dL (ref 0.3–1.3)
BUN/CREA RATIO: 7 (ref 6–22)
BUN: 4 mg/dL — ABNORMAL LOW (ref 8–25)
CALCIUM: 8.4 mg/dL — ABNORMAL LOW (ref 8.8–10.2)
CHLORIDE: 89 mmol/L — ABNORMAL LOW (ref 96–111)
CO2 TOTAL: 36 mmol/L — ABNORMAL HIGH (ref 23–31)
CREATININE: 0.61 mg/dL (ref 0.60–1.05)
ESTIMATED GFR: >90 mL/min/BSA (ref >=60)
GLUCOSE: 307 mg/dL — ABNORMAL HIGH (ref 65–125)
POTASSIUM: 2.9 mmol/L — ABNORMAL LOW (ref 3.5–5.1)
PROTEIN TOTAL: 6.2 g/dL (ref 6.0–8.0)
SODIUM: 134 mmol/L — ABNORMAL LOW (ref 136–145)

## 2019-07-11 LAB — MAGNESIUM: MAGNESIUM: 1.7 mg/dL — ABNORMAL LOW (ref 1.8–2.6)

## 2019-07-11 LAB — COVID-19 ~~LOC~~ MOLECULAR LAB TESTING: SARS-CoV-2: NOT DETECTED

## 2019-07-11 LAB — TROPONIN-I: TROPONIN I: <7 ng/L — ABNORMAL LOW (ref 7–30)

## 2019-07-11 LAB — POC FINGERSTICK GLUCOSE - BMC/JMC (RESULTS): GLUCOSE, POC: 331 mg/dL — ABNORMAL HIGH (ref 60–100)

## 2019-07-11 LAB — RED TOP TUBE

## 2019-07-11 LAB — PT/INR
INR: 2.01
PROTHROMBIN TIME: 23.5 s — ABNORMAL HIGH (ref 9.4–12.5)

## 2019-07-11 MED ORDER — SODIUM CHLORIDE 0.9 % INTRAVENOUS SOLUTION
2.00 g | Freq: Two times a day (BID) | INTRAVENOUS | Status: DC
Start: 2019-07-11 — End: 2019-07-14
  Administered 2019-07-11: 0 g via INTRAVENOUS
  Administered 2019-07-11 – 2019-07-12 (×2): 2 g via INTRAVENOUS
  Administered 2019-07-12: 0 g via INTRAVENOUS
  Administered 2019-07-12: 2 g via INTRAVENOUS
  Administered 2019-07-12 – 2019-07-13 (×2): 0 g via INTRAVENOUS
  Administered 2019-07-13: 2 g via INTRAVENOUS
  Administered 2019-07-13: 0 g via INTRAVENOUS
  Administered 2019-07-13: 2 g via INTRAVENOUS
  Administered 2019-07-14: 0 g via INTRAVENOUS
  Administered 2019-07-14: 2 g via INTRAVENOUS
  Filled 2019-07-11 (×8): qty 12.5

## 2019-07-11 MED ORDER — MORPHINE 4 MG/ML INTRAVENOUS SOLUTION
4.00 mg | INTRAVENOUS | Status: DC | PRN
Start: 2019-07-11 — End: 2019-07-14

## 2019-07-11 MED ORDER — METOPROLOL SUCCINATE ER 50 MG TABLET,EXTENDED RELEASE 24 HR
50.00 mg | ORAL_TABLET | Freq: Every day | ORAL | Status: DC
Start: 2019-07-11 — End: 2019-07-12
  Administered 2019-07-11: 50 mg via ORAL
  Filled 2019-07-11 (×2): qty 1

## 2019-07-11 MED ORDER — NITROGLYCERIN 0.4 MG SUBLINGUAL TABLET
0.4000 mg | SUBLINGUAL_TABLET | SUBLINGUAL | Status: DC | PRN
Start: 2019-07-11 — End: 2019-07-14

## 2019-07-11 MED ORDER — SODIUM CHLORIDE 0.9 % (FLUSH) INJECTION SYRINGE
10.00 mL | INJECTION | Freq: Three times a day (TID) | INTRAMUSCULAR | Status: DC
Start: 2019-07-11 — End: 2019-07-14
  Administered 2019-07-11 – 2019-07-14 (×9): 10 mL via INTRAVENOUS

## 2019-07-11 MED ORDER — PHARMACY VANCOMYCIN AND/OR AMINOGLYCOSIDE CONSULT - EAST
Freq: Once | Status: DC | PRN
Start: 2019-07-11 — End: 2019-07-14
  Filled 2019-07-11: qty 1

## 2019-07-11 MED ORDER — ACETAMINOPHEN 325 MG TABLET
650.00 mg | ORAL_TABLET | Freq: Four times a day (QID) | ORAL | Status: DC | PRN
Start: 2019-07-11 — End: 2019-07-14

## 2019-07-11 MED ORDER — LACTATED RINGERS IV BOLUS
30.00 mL/kg | INJECTION | Status: AC
Start: 2019-07-11 — End: 2019-07-11
  Administered 2019-07-11: 2613 mL via INTRAVENOUS
  Administered 2019-07-11: 0 mL via INTRAVENOUS

## 2019-07-11 MED ORDER — LISINOPRIL 5 MG TABLET
5.00 mg | ORAL_TABLET | Freq: Every day | ORAL | Status: DC
Start: 2019-07-12 — End: 2019-07-14
  Administered 2019-07-12 – 2019-07-14 (×3): 5 mg via ORAL
  Filled 2019-07-11 (×3): qty 1

## 2019-07-11 MED ORDER — IPRATROPIUM BROMIDE 0.02 % SOLUTION FOR INHALATION
0.50 mg | RESPIRATORY_TRACT | Status: DC
Start: 2019-07-11 — End: 2019-07-12
  Administered 2019-07-11 – 2019-07-12 (×5): 0.5 mg via RESPIRATORY_TRACT
  Filled 2019-07-11 (×5): qty 1

## 2019-07-11 MED ORDER — VANCOMYCIN 1.25 GRAM/250 ML-WATER FOR INJECTION(PEG,NADA) IV PIGGYBACK - 90 MIN INF
15.00 mg/kg | INJECTION | INTRAVENOUS | Status: AC
Start: 2019-07-11 — End: 2019-07-11
  Administered 2019-07-11: 1250 mg via INTRAVENOUS
  Administered 2019-07-11: 0 mg via INTRAVENOUS
  Filled 2019-07-11: qty 250

## 2019-07-11 MED ORDER — POTASSIUM BICARBONATE-CITRIC ACID 20 MEQ EFFERVESCENT TABLET
40.0000 meq | EFFERVESCENT_TABLET | Freq: Once | ORAL | Status: AC
Start: 2019-07-11 — End: 2019-07-11
  Administered 2019-07-11: 40 meq via ORAL
  Filled 2019-07-11: qty 2

## 2019-07-11 MED ORDER — SODIUM CHLORIDE 0.9 % (FLUSH) INJECTION SYRINGE
10.00 mL | INJECTION | INTRAMUSCULAR | Status: DC | PRN
Start: 2019-07-11 — End: 2019-07-14
  Administered 2019-07-13 – 2019-07-14 (×3): 10 mL via INTRAVENOUS

## 2019-07-11 MED ORDER — INSULIN GLARGINE (U-100) 100 UNIT/ML (3 ML) SUBCUTANEOUS PEN
30.00 [IU] | PEN_INJECTOR | Freq: Two times a day (BID) | SUBCUTANEOUS | Status: DC
Start: 2019-07-11 — End: 2019-07-13
  Administered 2019-07-11 – 2019-07-12 (×2): 30 [IU] via SUBCUTANEOUS
  Administered 2019-07-12: 0 [IU] via SUBCUTANEOUS
  Filled 2019-07-11: qty 3

## 2019-07-11 MED ORDER — ATORVASTATIN 40 MG TABLET
40.00 mg | ORAL_TABLET | Freq: Every evening | ORAL | Status: DC
Start: 2019-07-11 — End: 2019-07-14
  Administered 2019-07-11 – 2019-07-13 (×3): 40 mg via ORAL
  Filled 2019-07-11 (×3): qty 1

## 2019-07-11 MED ORDER — INSULIN LISPRO 100 UNIT/ML INJECTION SSIP - CITY
1.00 [IU] | Freq: Four times a day (QID) | SUBCUTANEOUS | Status: DC
Start: 2019-07-11 — End: 2019-07-14
  Administered 2019-07-11: 10 [IU] via SUBCUTANEOUS
  Administered 2019-07-12: 2 [IU] via SUBCUTANEOUS
  Administered 2019-07-12: 0 [IU] via SUBCUTANEOUS
  Administered 2019-07-12: 2 [IU] via SUBCUTANEOUS
  Administered 2019-07-12: 0 [IU] via SUBCUTANEOUS
  Administered 2019-07-13 (×3): 2 [IU] via SUBCUTANEOUS
  Administered 2019-07-13: 0 [IU] via SUBCUTANEOUS
  Administered 2019-07-14: 2 [IU] via SUBCUTANEOUS
  Administered 2019-07-14: 4 [IU] via SUBCUTANEOUS
  Administered 2019-07-14: 2 [IU] via SUBCUTANEOUS
  Filled 2019-07-11: qty 300

## 2019-07-11 MED ORDER — ALBUTEROL SULFATE CONCENTRATE 2.5 MG/0.5 ML SOLUTION FOR NEBULIZATION
2.50 mg | INHALATION_SOLUTION | RESPIRATORY_TRACT | Status: DC | PRN
Start: 2019-07-11 — End: 2019-07-14
  Administered 2019-07-12 – 2019-07-13 (×3): 2.5 mg via RESPIRATORY_TRACT
  Filled 2019-07-11 (×6): qty 1

## 2019-07-11 MED ORDER — CEFEPIME 2 GRAM/100 ML IN DEXTROSE (ISO-OSMOTIC) INTRAVENOUS PIGGYBACK
2.0000 g | INJECTION | Freq: Two times a day (BID) | INTRAVENOUS | Status: DC
  Filled 2019-07-11 (×2): qty 100

## 2019-07-11 MED ORDER — DEXTROSE 50 % IN WATER (D50W) INTRAVENOUS SYRINGE
12.50 g | INJECTION | INTRAVENOUS | Status: DC | PRN
Start: 2019-07-11 — End: 2019-07-14

## 2019-07-11 MED ORDER — APIXABAN 5 MG TABLET
5.00 mg | ORAL_TABLET | Freq: Two times a day (BID) | ORAL | Status: DC
Start: 2019-07-11 — End: 2019-07-14
  Administered 2019-07-11 – 2019-07-14 (×6): 5 mg via ORAL
  Filled 2019-07-11 (×6): qty 1

## 2019-07-11 MED ORDER — VANCOMYCIN 1.25 GRAM/250 ML IN DILUENT COMBINATION IV PIGGYBACK
15.00 mg/kg | INJECTION | Freq: Two times a day (BID) | INTRAVENOUS | Status: DC
Start: 2019-07-12 — End: 2019-07-14
  Administered 2019-07-12 (×2): 0 mg via INTRAVENOUS
  Administered 2019-07-12 – 2019-07-13 (×3): 1250 mg via INTRAVENOUS
  Administered 2019-07-13: 0 mg via INTRAVENOUS
  Administered 2019-07-13: 16:00:00 1250 mg via INTRAVENOUS
  Administered 2019-07-13: 0 mg via INTRAVENOUS
  Administered 2019-07-14 (×2): 1250 mg via INTRAVENOUS
  Administered 2019-07-14 (×2): 0 mg via INTRAVENOUS
  Administered 2019-07-14 – 2019-07-16 (×4): 1250 mg via INTRAVENOUS
  Filled 2019-07-11 (×7): qty 250

## 2019-07-11 MED ORDER — BUDESONIDE-FORMOTEROL HFA 160 MCG-4.5 MCG/ACTUATION AEROSOL INHALER
2.00 | INHALATION_SPRAY | Freq: Two times a day (BID) | RESPIRATORY_TRACT | Status: DC
Start: 2019-07-11 — End: 2019-07-14
  Administered 2019-07-11 – 2019-07-14 (×6): 2 via RESPIRATORY_TRACT
  Filled 2019-07-11: qty 6

## 2019-07-11 MED ORDER — SODIUM CHLORIDE 0.9% FLUSH BAG - 250 ML
INTRAVENOUS | Status: DC | PRN
Start: 2019-07-11 — End: 2019-07-14

## 2019-07-11 MED ORDER — POTASSIUM CHLORIDE ER 20 MEQ TABLET,EXTENDED RELEASE(PART/CRYST)
40.0000 meq | ORAL_TABLET | Freq: Once | ORAL | Status: AC
Start: 2019-07-11 — End: 2019-07-11
  Administered 2019-07-11: 40 meq via ORAL
  Filled 2019-07-11: qty 2

## 2019-07-11 MED ORDER — CEFEPIME 1 GRAM/50 ML IN DEXTROSE (ISO-OSMOTIC) INTRAVENOUS PIGGYBACK
1.0000 g | INJECTION | INTRAVENOUS | Status: AC
  Administered 2019-07-11: 0 g via INTRAVENOUS
  Administered 2019-07-11: 1 g via INTRAVENOUS
  Filled 2019-07-11: qty 50

## 2019-07-11 MED ORDER — ALBUTEROL SULFATE CONCENTRATE 2.5 MG/0.5 ML SOLUTION FOR NEBULIZATION
2.50 mg | INHALATION_SOLUTION | RESPIRATORY_TRACT | Status: DC
Start: 2019-07-11 — End: 2019-07-12
  Administered 2019-07-11 – 2019-07-12 (×5): 2.5 mg via RESPIRATORY_TRACT
  Filled 2019-07-11 (×2): qty 1

## 2019-07-11 NOTE — Nurses Notes (Signed)
Patient arrived to the unit to room 531 from ED. Vital signs taken, patient tachycardic HR 118, respirations 28. Patient stating she is short of breath upon arrival to room. RT called for breathing treatment. Patient on 4L NC, patient states she wears O2 at home. Patient reports no pain at this time. Bilateral lower legs open to air, +3 edema, redness, and weeping present. Patient has call bell within reach, assuming care.

## 2019-07-11 NOTE — ED Nurses Note (Signed)
Pt resting in stretcher. Pt is on cardiac monitor. Pt remains on 4L, via nasal cannula, Pt SP02 is 99%. Stretcher in lowest position, both side rails raised. Call light within reach. Will continue to monitor.

## 2019-07-11 NOTE — Care Plan (Signed)
Problem: Functional Ability Impaired COPD (Chronic Obstructive Pulmonary Disease)  Goal: Optimal Level of Functional Independence  Intervention: Optimize Functional Ability  Flowsheets (Taken 07/11/2019 2105)  Self-Care Promotion:   independence encouraged   BADL personal objects within reach   safe use of adaptive equipment encouraged  Activity Management:   activity adjusted per tolerance   activity clustered for rest periods   ambulated to bathroom   sitting, edge of bed  Environmental Support:   calm environment promoted   distractions minimized   environmental consistency promoted   personal routine supported   rest periods encouraged     Problem: Respiratory Compromise COPD (Chronic Obstructive Pulmonary Disease)  Goal: Effective Oxygenation and Ventilation  Intervention: Promote Airway Secretion Clearance  Flowsheets (Taken 07/11/2019 2105)  Cough And Deep Breathing: done independently per patient  Activity Management:   activity adjusted per tolerance   activity clustered for rest periods   ambulated to bathroom   sitting, edge of bed     Problem: Diabetes Comorbidity  Goal: Blood Glucose Level Within Targeted Range  Intervention: Monitor and Manage Glycemia  Flowsheets (Taken 07/11/2019 2218)  Glycemic Management:   blood glucose monitored   oral hydration promoted   supplemental insulin given     Problem: Skin or Soft Tissue Infection  Goal: Infection Symptom Resolution  Intervention: Provide Meticulous Infection Site Care  Flowsheets  Taken 07/11/2019 2218  Infection Prevention:   glycemic control managed   personal protective equipment utilized   promote handwashing   rest/sleep promoted   environmental surveillance performed   single patient room provided  Topical Inflammation Care: border marked  Taken 07/11/2019 2105  Fever Reduction/Comfort Measures:   lightweight bedding   lightweight clothing  Intervention: Minimize/Manage Infection Progression  Flowsheets (Taken 07/11/2019 2218)  Infection Management:  aseptic technique maintained

## 2019-07-11 NOTE — ACP (Advance Care Planning) (Signed)
Goals of Care  ([place]) Documentation    Purpose of Encounter: evaluationdiscussion    Parties in Attendance: Patient    Patient's Decisional Capacity (Name of surrogate if no capacity)  possesses medical decision making capacity      Subjective/Patient Story:   Presented with bilateral lower extremity swelling and rash    Objective/Medical Story:   Patient with history of chronic hypoxic respiratory failure, type 2 diabetes, COPD presented with bilateral lower extremity swelling and rash.  Admitted due to sepsis secondary to cellulitis.    Goals of Care Determinations:  Full code    Patient would like to be resuscitated in case of a cardiac arrest.  She is okay with intubation and mechanical ventilation, chest compressions and advanced medications.    Plan:  Patient Active Problem List   Diagnosis    Obesity (BMI 35.0-39.9 without comorbidity)    Diabetes (CMS HCC)    Hypertension due to endocrine disorder    Non-compliance    Acute respiratory failure (CMS HCC)    Tobacco use disorder    COPD with acute exacerbation (CMS HCC)    Acute exacerbation of chronic obstructive pulmonary disease (COPD) (CMS HCC)    CAP (community acquired pneumonia)    Sepsis    Acute hypoxemic respiratory failure (CMS HCC)    Atrial fibrillation with rapid ventricular response (CMS HCC)    COPD exacerbation (CMS HCC)    Sepsis due to cellulitis (CMS HCC)          Code Status:  Code Status Information     Code Status    Prior          Other documents completed (e.g. Advance directives, POLST, MOLST):  no    Time spent discussing advance care planning (ACP) - required if submitting a charge for an ACP discussion:  17 minutes

## 2019-07-11 NOTE — H&P (Signed)
[facility]  [place], [place] [address]    General History and Physical    [name], [name]  Date of Admission:  07/11/2019  Date of Birth:  1955-11-26    PCP: Hubbard Robinson, DO  Chief Complaint:   Bilateral lower extremity swelling and rash      HPI: [name] is a 64 y.o., White female with medical history of chronic hypoxic respiratory failure on home oxygen, COPD, type 2 diabetes, hypertension, tobacco dependency who presents with  bilateral lower extremity swelling and rash.  Patient reports having worsening lower extremity swelling for last couple weeks.  Both legs turning to the red, tender to touch, warm and findings were gradually worsening.  Initially she went to the primary care physician and was prescribed oral antibiotic however before starting this oral antibiotics her leg swelling and rash got significantly worse therefore decided to come to the emergency department.  Found to have bilateral lower extremity cellulitis in the emergency department, declared septic.  Will admit for further workup and treatment related to sepsis secondary to cellulitis.  She reports being chronically short of breath, continues to smoke unfortunately.  Denies any abdominal pain, chest pain, shortness of breath, headache.    There are no active hospital problems to display for this patient.      Past Medical History:   Diagnosis Date   . COPD (chronic obstructive pulmonary disease) (CMS HCC)    . Degenerative joint disease involving multiple joints    . Diabetes mellitus (CMS [place])    . HTN (hypertension)    . Smoker    . 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 Wears glasses            Past Surgical History:   Procedure Laterality Date   . HX CARPAL TUNNEL RELEASE             Medications Prior to Admission     Prescriptions    ADVAIR DISKUS 500-50 mcg/dose Inhalation Disk with Device oral diskus inhaler    INHALE 1 DOSE BY MOUTH TWICE DAILY    Patient taking differently:  Take 1  INHALATION by inhalation Twice daily INHALE 1 DOSE BY MOUTH TWICE DAILY    albuterol (PROVENTIL) 2.5 mg/0.5 mL Inhalation Solution for Nebulization    USE 1 VIAL IN NEBULIZER EVERY 4 HOURS    Patient taking differently:  2.5 mg by Nebulization route Every 4 hours as needed     albuterol sulfate (PROVENTIL) 2.5 mg /3 mL (0.083 %) Inhalation Solution for Nebulization    USE 1 VIAL IN NEBULIZER 4 TIMES DAILY    Patient taking differently:  2.5 mg by Nebulization route Every 4 hours as needed     apixaban (ELIQUIS) 5 mg Oral Tablet    Take 1 Tablet (5 mg total) by mouth Every 12 hours for 30 days    aspirin 81 mg Oral Tablet, Chewable    Take 1 Tab (81 mg total) by mouth Once a day    Patient not taking:  Reported on 07/08/2019    atorvastatin (LIPITOR) 40 mg Oral Tablet    Take 1 tablet by mouth once daily    Patient taking differently:  Take 40 mg by mouth Every evening     ATROVENT HFA 17 mcg/actuation Inhalation HFA Aerosol Inhaler oral inhaler    INHALE 2 PUFFS BY MOUTH 4 TIMES DAILY    cephalexin (KEFLEX) 500 mg  Oral Capsule    Take 1 Capsule (500 mg total) by mouth Four times a day for 7 days    cephalexin (KEFLEX) 500 mg Oral Capsule    Take 1 Capsule (500 mg total) by mouth Four times a day for 7 days    furosemide (LASIX) 20 mg Oral Tablet    Take 1 Tablet (20 mg total) by mouth Once a day for 30 days    insulin glargine (LANTUS SOLOSTAR U-100 INSULIN) 100 unit/mL Subcutaneous Insulin Pen    30 Units by Subcutaneous route Twice daily    ipratropium (ATROVENT) 0.02 % Inhalation Solution    2.5 mL (0.5 mg total) by Nebulization route ONCE EVERY 4 HOURS    lisinopriL (PRINIVIL) 5 mg Oral Tablet    Take 1 tablet by mouth once daily    Patient taking differently:  Take 5 mg by mouth Once a day Take 1 tablet by mouth once daily    metoprolol succinate (TOPROL-XL) 50 mg Oral Tablet Sustained Release 24 hr    Take 1 Tablet (50 mg total) by mouth Once a day for 30 days        ceFEPime (MAXIPIME) 1 g in iso-osmotic 50 mL  premix IVPB, 1 g, Intravenous, Now  LR bolus infusion 30 mL/kg = 2,613 mL, 30 mL/kg, Intravenous, Now  vancomycin (VANCO READY) 1.25 g in sterile water 250 mL premix IVPB, 15 mg/kg, Intravenous, Now        Allergies   Allergen Reactions   . Codeine Nausea/ Vomiting     Sick to stomach   . Hydrocodone Nausea/ Vomiting   . Metformin Diarrhea   . Oxycodone Nausea/ Vomiting       Social History     Tobacco Use   . Smoking status: Current Every Day Smoker     Packs/day: 1.00     Years: 20.00     Pack years: 20.00     Types: Cigarettes   . Smokeless tobacco: Never Used   Substance Use Topics   . Alcohol use: No       Family Medical History:     Problem Relation (Age of Onset)    COPD Mother    Coronary Artery Disease Father    Diabetes Mother, Sister, Brother, Paternal Grandmother              ROS: Other than ROS in the HPI, all other systems were negative.    DNR Status:  Prior    EXAM:  Temperature: 36.6 C (97.8 F)  Heart Rate: (!) 113  BP (Non-Invasive): (!) 143/71  Respiratory Rate: (!) 23  SpO2: 97 %  General: appears chronically ill, not in acute distress  Eyes: Pupils equal and round, reactive to light and accomodation.   HEENT: Head atraumatic and normocephalic   Neck: No JVD or thyromegaly    Lungs:  Bilateral wheezing in all lung fields present, crackles+   Cardiovascular: S1S2+ rhythm is irregularly irregular  Abdomen: Not distended, bowel sounds normoactive, abdomen is Soft, non-tender with palpation  Extremities:  2+ bilateral pitting edema at lower extremity  Skin:  Bilateral skin rash covering from foot to knee with blisters   Neurologic: Grossly normal   Psychiatric: Normal affect, behavior            Labs:    I have reviewed all lab results.  Lab Results for Last 24 Hours:    Results for orders placed or performed during the hospital encounter of 07/11/19 (from the  past 24 hour(s))   COMPREHENSIVE METABOLIC PANEL, NON-FASTING   Result Value Ref Range    SODIUM 134 (L) 136 - 145 mmol/L    POTASSIUM 2.9  (L) 3.5 - 5.1 mmol/L    CHLORIDE 89 (L) 96 - 111 mmol/L    CO2 TOTAL 36 (H) 23 - 31 mmol/L    ANION GAP 9 4 - 13 mmol/L    BUN 4 (L) 8 - 25 mg/dL    CREATININE 0.61 0.60 - 1.05 mg/dL    BUN/CREA RATIO 7 6 - 22    ESTIMATED GFR >90 >=60 mL/min/BSA    ALBUMIN 2.4 (L) 3.4 - 4.8 g/dL     CALCIUM 8.4 (L) 8.8 - 10.2 mg/dL    GLUCOSE 307 (H) 65 - 125 mg/dL    ALKALINE PHOSPHATASE 85 50 - 130 U/L    ALT (SGPT) 9 8 - 22 U/L    AST (SGOT)  9 8 - 45 U/L    BILIRUBIN TOTAL 0.9 0.3 - 1.3 mg/dL    PROTEIN TOTAL 6.2 6.0 - 8.0 g/dL   C-REACTIVE PROTEIN(CRP),INFLAMMATION   Result Value Ref Range    CRP INFLAMMATION 126.6 (H) <8.0 mg/L   LACTIC ACID LEVEL W/ REFLEX FOR LEVEL >2.0   Result Value Ref Range    LACTIC ACID 1.8 0.5 - 2.2 mmol/L   PT/INR   Result Value Ref Range    PROTHROMBIN TIME 23.5 (H) 9.4 - 12.5 seconds    INR 2.01    PTT (PARTIAL THROMBOPLASTIN TIME)   Result Value Ref Range    APTT 39.7 (H) 25.1 - 36.5 seconds   TROPONIN-I   Result Value Ref Range    TROPONIN I <7 (L) 7 - 30 ng/L   CBC WITH DIFF   Result Value Ref Range    WBC 8.7 4.0 - 11.0 x10^3/uL    RBC 3.92 (L) 4.00 - 5.10 x10^6/uL    HGB 11.7 (L) 12.0 - 15.5 g/dL    HCT 35.3 (L) 36.0 - 45.0 %    MCV 90.0 82.0 - 97.0 fL    MCH 29.7 27.5 - 33.2 pg    MCHC 33.1 32.0 - 36.0 g/dL    RDW 14.1 11.0 - 16.0 %    PLATELETS 312 150 - 450 x10^3/uL    MPV 8.0 7.4 - 10.5 fL    NEUTROPHIL % 67 43 - 76 %    LYMPHOCYTE % 14 (L) 15 - 43 %    MONOCYTE % 16 (H) 5 - 12 %    EOSINOPHIL % 3 0 - 5 %    BASOPHIL % 1 0 - 3 %    NEUTROPHIL # 5.80 1.50 - 6.50 x10^3/uL    LYMPHOCYTE # 1.20 1.00 - 4.80 x10^3/uL    MONOCYTE # 1.40 (H) 0.20 - 0.90 x10^3/uL    EOSINOPHIL # 0.20 0.00 - 0.50 x10^3/uL    BASOPHIL # 0.10 0.00 - 0.10 x10^3/uL   WOUND, SUPERFICIAL/NON-STERILE SITE, AEROBIC CULTURE AND GRAM STAIN    Specimen: Wound; Other   Result Value Ref Range    GRAM STAIN 1+ Rare WBCs     GRAM STAIN No Organisms Seen    URINALYSIS, MACROSCOPIC   Result Value Ref Range    COLOR Light Yellow  Light Yellow, Straw, Yellow    APPEARANCE Clear Clear    PH 6.5 <8.0    LEUKOCYTES Trace (A) Negative WBCs/uL    NITRITE Negative Negative    PROTEIN Negative Negative, 10  mg/dL    GLUCOSE 500  (A) Negative mg/dL    KETONES Negative Negative mg/dL    UROBILINOGEN >= 8.0 (A) <=2.0 mg/dL    BILIRUBIN Negative Negative mg/dL    BLOOD Negative Negative mg/dL    SPECIFIC GRAVITY 1.010 <1.022   URINALYSIS, MICROSCOPIC   Result Value Ref Range    RBCS None 0 - 2 /hpf    WBCS 2-5 (A) 0 - 2 /hpf    BACTERIA Slight (A) None /hpf    SQUAMOUS EPITHELIAL 2-5 (A) 0 - 2 /hpf    MUCOUS Marked (A) None, Light /hpf    HYALINE CASTS 2-5 (A) 0 - 2 /lpf   COVID-19 SCREENING (COVID only)   Result Value Ref Range    SARS-CoV-2 Not Detected Not Detected       Imaging Studies:    Results for orders placed or performed during the hospital encounter of 07/11/19 (from the past 24 hour(s))   XR AP MOBILE CHEST     Status: None    Narrative    RADIOLOGIST: Lars Masson, MD    EXAMINATION: XR AP MOBILE CHEST     EXAM DATE/TIME: 07/11/2019 3:44 PM    CLINICAL INDICATION: fever    COMPARISON: None.    FINDINGS:     Cardiovascular:   The cardiac size is prominent.    Lungs:   There is mild prominence of vascular markings.    Pleural spaces:   Small left pleural effusion.    Chest wall:   No acute findings.      Impression    Cardiomegaly.    Mild pulmonary vascular congestion. Small left pleural effusion.        Radiologist location ID: [id number]          DVT RISK FACTORS HAVE BEN ASSESSED AND PROPHYLAXIS ORDERED (SEE RUBYONLINE - REFERENCE TOOLS - MD, DVT PROPHY OR POCKET CARD)    Assessment/Plan:      Sepsis secondary to lower extremity cellulitis, more severe at right lower extremity however bilateral extremities are involved  --s/p 30 cc/kg fluid resuscitation the emergency department, wont continue mIVf due to congestion  --continue following blood culture results  --empiric antibiotics with cefepime and vancomycin  --consult wound care    Chronic  hypoxic respiratory failure, on 3-4L home O2  COPD, not in acute exacerbation  --continue DuoNeb q.4 hours  --continue Symbicort  --p.r.n. albuterol  --supplemental oxygen    Hypokalemia  --replace potassium   --check Magnesium     Paroxysmal atrial fibrillation  --continue anticoagulation with Eliquis  --continue home metoprolol  --telemetry monitoring     HFpEF, not in acute exacerbation  --hold Lasix due to sepsis  --continue metoprolol and statin    Uncontrolled type 2 diabetes, insulin-dependent, hyperglycemic  --Lantus 30 units b.i.d.  --blood glucose checks and insulin sliding scale    Tobacco dependency  --counseling performed        DVT prophylaxis.  Eliquis  Code status. Full code.  HCS: Husband [name]       Melinda Riedesel Benson Setting, MD        Portions of this note may be dictated using voice recognition software or a dictation service. Variances in spelling and vocabulary are possible and unintentional. Not all errors are caught/corrected. Please notify the [facility] if any discrepancies are noted or if the meaning of any statement is not clear.

## 2019-07-11 NOTE — Nurses Notes (Signed)
07/11/19 1953   [facility] Fall Risk Assessment Tool   Fall Risk Assessment Shift Assessment   Fall Risk- Implement fall risk interventions per protocol 0   Age 64   Fall History 0   Elimination, Bowel and Urine 0   Medications (Fall risk drugs: PCA/Opiates, Anti-Convulsants, Anti-Hypertensives, Diuretics, Hypnotics, Laxatives, Sedatives and Psychotropics) 3   Patient Care Equipment 1   Mobility (Yes on any of these rows will result in High Fall Risk)   Requires assistance or supervision for mobility, transfer, or ambulation 0   Unsteady Gait 0   Visual or auditory impairment affecting mobility 0   Cognition (Yes on any of these rows will result in High Fall Risk)   Altered awareness of immediate physical environment 0   Impulsive 0   Lack of understanding of one's physical and cognitive limitations 0   [facility] Score and Interventions   [facility] Score Total 5   Fall Risk Interventions (All That Apply) (L,M,H) Universal Falls Precautions   Safety   Code status band matches order Yes   SBAR   Patient ID Verification (Scan Armband): [id number]   SBAR given: Yes   Handoff report given to: Clayborne Dana, RN   Dashboard Updated Yes   Dashboard Reviewed Yes

## 2019-07-11 NOTE — ED Triage Notes (Signed)
Per pt, bilateral leg swelling, redness, discoloration, warm to the touch since being DC from hospital 2 weeks ago.

## 2019-07-11 NOTE — ED Provider Notes (Signed)
[name], MD  [facility]  Emergency Department Visit Note    Date of Service: 07/11/2019  Primary Care Provider: Hubbard Robinson, DO  Means of arrival: Private Vehicle    History obtained from: Patient  History/Exam limitations: None    Chief Complaint:  Leg swelling and redness    History of Present Illness     [name], 05/10/1955, is a/an 64 y.o. female who presents to the Emergency Department with leg swelling and redness for 2 weeks now, which is worsening, and starting to have some blisters.  The patient was recently admitted for    Review of Systems     The pertinent positive and negative symptoms are as per the HPI. They are otherwise negative as reviewed.  COPD exacerbation with AFib with RVR on 06/16, and was discharged with some erythema in the lower extremities.  She had not been taking any antibiotics, but states that since then, has been worsening, with multiple blisters, and open wounds to the lower extremities.  She had seen her primary care doctor 3 days ago, who had prescribed her Keflex, but was told not to take this until things got worse.  She states that is now worse, with which she states drainage from the open wounds in the back of the legs.    Patient History     Past Medical History:  Past Medical History:   Diagnosis Date   . COPD (chronic obstructive pulmonary disease) (CMS HCC)    . Degenerative joint disease involving multiple joints    . Diabetes mellitus (CMS [place])    . HTN (hypertension)    . Smoker    . 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 Wears glasses            Past Surgical History:  Past Surgical History:   Procedure Laterality Date   . HX CARPAL TUNNEL RELEASE             Family History:  Non-contributory    Social History:  Social History     Tobacco Use   . Smoking status: Current Every Day Smoker     Packs/day: 1.00     Years: 20.00     Pack years: 20.00     Types: Cigarettes   . Smokeless tobacco: Never Used   Vaping Use   . Vaping Use: Never used    Substance Use Topics   . Alcohol use: No   . Drug use: No       Social History     Substance and Sexual Activity   Drug Use No       Current Outpatient Medications:   See triage note    Allergies:   Allergies   Allergen Reactions   . Codeine Nausea/ Vomiting     Sick to stomach   . Hydrocodone Nausea/ Vomiting   . Metformin Diarrhea   . Oxycodone Nausea/ Vomiting       Physical Exam     Vital Signs:  Filed Vitals:    07/11/19 1334 07/11/19 1415   BP: (!) 140/62 (!) 143/71   Pulse: (!) 124 (!) 113   Resp: (!) 21 (!) 23   Temp: 36.6 C (97.8 F)    SpO2: 94% 97%       Pulse Ox interpreted by me:  94 % on RA, normal    Physical Exam:   Constitutional:  Mild acute distress, tachypneic  Head: Normocephalic and atraumatic.   ENT: Moist mucous membranes. No erythema or exudates in the oropharynx. Normal voice.  Eyes: EOM are normal. Pupils are equal, round, and reactive to light. No scleral icterus.   Neck: Neck supple. FROM neck.  Cardiovascular:  Tachycardic. No murmur heard. 2+ distal pulses all 4 extremities.  Pulmonary/Chest: Effort normal and breath sounds normal.   Abdominal: Soft with no distension. No abdominal tenderness  Back: There is no CVA tenderness.   Musculoskeletal:  No clubbing or cyanosis.  2+ pitting edema both lower extremities, with erythema up to 3 cm below both knees, symmetric, with clear margin borders, and bulla to the anterior shins and back of the legs and dorsum of the feet, draining serous fluid.  Warm to the touch, with tenderness.  Neurological: Patient is alert and awake. Strength and sensation normal in all extremities. Normal facial symmetry and speech.   Skin: Skin is warm and dry.                  Diagnostics     Labs:    Results for orders placed or performed during the hospital encounter of 07/11/19   WOUND, SUPERFICIAL/NON-STERILE SITE, AEROBIC CULTURE AND GRAM STAIN    Specimen: Wound; Other   Result Value Ref Range    GRAM STAIN 1+ Rare WBCs     GRAM STAIN No Organisms Seen     COMPREHENSIVE METABOLIC PANEL, NON-FASTING   Result Value Ref Range    SODIUM 134 (L) 136 - 145 mmol/L    POTASSIUM 2.9 (L) 3.5 - 5.1 mmol/L    CHLORIDE 89 (L) 96 - 111 mmol/L    CO2 TOTAL 36 (H) 23 - 31 mmol/L    ANION GAP 9 4 - 13 mmol/L    BUN 4 (L) 8 - 25 mg/dL    CREATININE 0.61 0.60 - 1.05 mg/dL    BUN/CREA RATIO 7 6 - 22    ESTIMATED GFR >90 >=60 mL/min/BSA    ALBUMIN 2.4 (L) 3.4 - 4.8 g/dL     CALCIUM 8.4 (L) 8.8 - 10.2 mg/dL    GLUCOSE 307 (H) 65 - 125 mg/dL    ALKALINE PHOSPHATASE 85 50 - 130 U/L    ALT (SGPT) 9 8 - 22 U/L    AST (SGOT)  9 8 - 45 U/L    BILIRUBIN TOTAL 0.9 0.3 - 1.3 mg/dL    PROTEIN TOTAL 6.2 6.0 - 8.0 g/dL   C-REACTIVE PROTEIN(CRP),INFLAMMATION   Result Value Ref Range    CRP INFLAMMATION 126.6 (H) <8.0 mg/L   LACTIC ACID LEVEL W/ REFLEX FOR LEVEL >2.0   Result Value Ref Range    LACTIC ACID 1.8 0.5 - 2.2 mmol/L   PT/INR   Result Value Ref Range    PROTHROMBIN TIME 23.5 (H) 9.4 - 12.5 seconds    INR 2.01    PTT (PARTIAL THROMBOPLASTIN TIME)   Result Value Ref Range    APTT 39.7 (H) 25.1 - 36.5 seconds   TROPONIN-I   Result Value Ref Range    TROPONIN I <7 (L) 7 - 30 ng/L   CBC WITH DIFF   Result Value Ref Range    WBC 8.7 4.0 - 11.0 x10^3/uL    RBC 3.92 (L) 4.00 - 5.10 x10^6/uL    HGB 11.7 (L) 12.0 - 15.5 g/dL    HCT 35.3 (L) 36.0 - 45.0 %    MCV 90.0 82.0 - 97.0 fL    MCH 29.7 27.5 -  33.2 pg    MCHC 33.1 32.0 - 36.0 g/dL    RDW 14.1 11.0 - 16.0 %    PLATELETS 312 150 - 450 x10^3/uL    MPV 8.0 7.4 - 10.5 fL    NEUTROPHIL % 67 43 - 76 %    LYMPHOCYTE % 14 (L) 15 - 43 %    MONOCYTE % 16 (H) 5 - 12 %    EOSINOPHIL % 3 0 - 5 %    BASOPHIL % 1 0 - 3 %    NEUTROPHIL # 5.80 1.50 - 6.50 x10^3/uL    LYMPHOCYTE # 1.20 1.00 - 4.80 x10^3/uL    MONOCYTE # 1.40 (H) 0.20 - 0.90 x10^3/uL    EOSINOPHIL # 0.20 0.00 - 0.50 x10^3/uL    BASOPHIL # 0.10 0.00 - 0.10 x10^3/uL     Labs reviewed and interpreted by me.    Radiology:   Results for orders placed or performed during the hospital encounter of 07/11/19  (from the past 24 hour(s))   XR AP MOBILE CHEST     Status: None    Narrative    RADIOLOGIST: Lars Masson, MD    EXAMINATION: XR AP MOBILE CHEST     EXAM DATE/TIME: 07/11/2019 3:44 PM    CLINICAL INDICATION: fever    COMPARISON: None.    FINDINGS:     Cardiovascular:   The cardiac size is prominent.    Lungs:   There is mild prominence of vascular markings.    Pleural spaces:   Small left pleural effusion.    Chest wall:   No acute findings.      Impression    Cardiomegaly.    Mild pulmonary vascular congestion. Small left pleural effusion.        Radiologist location ID: [id number]         PERIPHERAL VENOUS DUPLEX - LOWER  XR AP MOBILE CHEST  Interpreted by radiologist and independently reviewed by me.    EKG interpretation:  12-Lead EKG interpreted by me reveals sinus tachycardia at 108 with no ST elevations or depressions, occasional PACs      ED Progress Note/ Medical Decision Making     Old records reviewed by me:  Patient was admitted for COPD, and AFib with RVR on 06/16, reviewed this discharge summary.  Echocardiogram shows EF = 55-60% in 6/21    Orders Placed This Encounter   . ADULT ROUTINE BLOOD CULTURE, SET OF 2 BOTTLES (BACTERIA AND YEAST)   . ADULT ROUTINE BLOOD CULTURE, SET OF 2 BOTTLES (BACTERIA AND YEAST)   . URINE CULTURE   . WOUND, SUPERFICIAL/NON-STERILE SITE, AEROBIC CULTURE AND GRAM STAIN   . URINE CULTURE,ROUTINE   . XR AP MOBILE CHEST   . CBC/DIFF   . COMPREHENSIVE METABOLIC PANEL, NON-FASTING   . C-REACTIVE PROTEIN(CRP),INFLAMMATION   . LACTIC ACID LEVEL W/ REFLEX FOR LEVEL >2.0   . PT/INR   . PTT (PARTIAL THROMBOPLASTIN TIME)   . TROPONIN-I   . URINALYSIS W/REFLEX TO CULTURE - [facility] ONLY   . CBC WITH DIFF   . URINALYSIS, MACROSCOPIC   . EXTRA TUBES - [facility] ONLY   . GOLD TOP TUBE   . RED TOP TUBE   . COVID-19 SCREENING (COVID only)   . URINALYSIS, MICROSCOPIC   . ECG 12-LEAD   . PATIENT CLASS/LEVEL OF CARE DESIGNATION - [facility]   . PERIPHERAL VENOUS DUPLEX - LOWER   . LR bolus infusion 30 mL/kg =  2,613 mL   . ceFEPime (  MAXIPIME) 1 g in iso-osmotic 50 mL premix IVPB   . vancomycin (VANCO READY) 1.25 g in sterile water 250 mL premix IVPB     Patient here with bilateral erythema in the lower extremities, with pain, does appear to be due to cellulitis.  There are bullae, that are draining, the patient is tachypneic and tachycardic.  Will give IV fluids LR 30 cc/kg fluid bolus, and perform septic workup.     I have declared Sepsis at this time.  I have ordered a repeat Lactic Acid  secondary to initial lactic acid >2.0.   I have verbally notified RN that I have declared sepsis.   I have ordered  Antibiotics to be administered after blood cultures have been drawn.  I have ordered 30 ml/kg based off of the ideal body weight of Ideal body weight: 52.4 kg (115 lb 8.3 oz)  Adjusted ideal body weight: 66.3 kg (146 lb 1.8 oz) of LR    Lab values came back, with normal lactate and white blood cell count, but CRP has now doubled to 120 since 06/16.  Will admit the patient for sepsis, as well as antibiotics.  The patient had been admitted here about 1 week ago, and will likely require pseudomonal coverage, as she is a diabetic, so will place her on cefepime and vancomycin.  Wound swab was performed before antibiotics.  I spoke to Dr. Suzanna Obey, hospitalist, who agrees to admit the patient for further management.      Pre-Disposition Vitals:  Filed Vitals:    07/11/19 1334 07/11/19 1415   BP: (!) 140/62 (!) 143/71   Pulse: (!) 124 (!) 113   Resp: (!) 21 (!) 23   Temp: 36.6 C (97.8 F)    SpO2: 94% 97%       Clinical Impression     1. Sepsis  2. Bilateral lower extremity cellulitis  3. Hypokalemia  4. Hyponatremia    Critical Care Time:  Total critical care time spent in direct care of this patient at high risk based on presenting history/exam/and complaint, including initial evaluation and stabilization, review of data, re-examination, discussion with admitting and consulting services to arrange definitive care, and exclusive  of any procedures performed, was 45 minutes    Disposition/Plan     Admitted    Prescriptions:  New Prescriptions    No medications on file       Follow Up:  No follow-up provider specified.    Condition on Disposition:  Guarded

## 2019-07-11 NOTE — ED Nurses Note (Signed)
Report called to [place], Therapist, sports. Pt to be transferred to Rm. [id number].

## 2019-07-11 NOTE — Nurses Notes (Addendum)
Pt arrived on unit 7/1 @ 1820. Entered wound info on BLE. Blisters present, with occasional weeping (clear - yellow drainage). Area warm to touch, red/ruddy. Pt refusing socks, but able to get up to toilet with one assist. Some discomfort, but refusing Rx at this time. Admission questions being done with patient, who is alert, oriented x 4. Bed alarm active, call bell within reach. Pt has wound care consult.

## 2019-07-11 NOTE — ED Nurses Note (Signed)
Pt off floor for US

## 2019-07-11 NOTE — Pharmacy (Addendum)
[facility] / Department of Pharmaceutical Services  Therapeutic Drug Monitoring: Vancomycin  07/11/2019      Patient name: [name], [name]  Date of Birth:  03-24-1955    Actual Weight:  Weight: 87.1 kg (192 lb) (07/11/19 1334)     BMI:  BMI (Calculated): 34.08 (07/11/19 1334)    Date RPh Current regimen (including mg/kg) Indication AUC or trough based dosing Target Levels^ SCr (mg/dL) CrCl* (mL/min) Measured level(s)   (mcg/mL) Calculated AUC (if AUC based monitoring) Plan (including when levels are due) Comments   7/1 jpf 1250 mg x1 Cellulitis  AUC 400-600 0.61 100.1  469 1250 mg q12h  Levels 7/3    Vanc 1250 mg q12h                                                                                          ^Goal Target depends on dosing and monitoring method, AUC vs. trough based. For AUC based dosing units are mg*h/L. For trough based dosing units are mcg/mL.     *Creatinine clearance is estimated by using the Cockcroft-Gault equation for adult patients and the Carol Ada for pediatric patients.    The decision to discontinue vancomycin therapy will be determined by the primary service.  Please contact the pharmacist with any questions regarding this patient's medication regimen.    Lorelee Market, PHARMD  07/11/2019, 18:47

## 2019-07-12 DIAGNOSIS — J449 Chronic obstructive pulmonary disease, unspecified: Secondary | ICD-10-CM

## 2019-07-12 DIAGNOSIS — I4891 Unspecified atrial fibrillation: Secondary | ICD-10-CM

## 2019-07-12 DIAGNOSIS — E876 Hypokalemia: Secondary | ICD-10-CM

## 2019-07-12 LAB — POC FINGERSTICK GLUCOSE - BMC/JMC (RESULTS)
GLUCOSE, POC: 114 mg/dL — ABNORMAL HIGH (ref 60–100)
GLUCOSE, POC: 174 mg/dL — ABNORMAL HIGH (ref 60–100)
GLUCOSE, POC: 106 mg/dL — ABNORMAL HIGH (ref 60–100)
GLUCOSE, POC: 146 mg/dL — ABNORMAL HIGH (ref 60–100)

## 2019-07-12 LAB — CBC WITH DIFF
BASOPHIL #: 0 10*3/uL (ref 0.00–0.10)
BASOPHIL %: 0 % (ref 0–3)
EOSINOPHIL #: 0.4 10*3/uL (ref 0.00–0.50)
EOSINOPHIL %: 5 % (ref 0–5)
HCT: 31.8 % — ABNORMAL LOW (ref 36.0–45.0)
HGB: 10.6 g/dL — ABNORMAL LOW (ref 12.0–15.5)
LYMPHOCYTE #: 1.5 10*3/uL (ref 1.00–4.80)
LYMPHOCYTE %: 19 % (ref 15–43)
MCH: 30.2 pg (ref 27.5–33.2)
MCHC: 33.4 g/dL (ref 32.0–36.0)
MCV: 90.3 fL (ref 82.0–97.0)
MONOCYTE #: 1.4 10*3/uL — ABNORMAL HIGH (ref 0.20–0.90)
MONOCYTE %: 17 % — ABNORMAL HIGH (ref 5–12)
MPV: 8 fL (ref 7.4–10.5)
NEUTROPHIL #: 4.7 10*3/uL (ref 1.50–6.50)
NEUTROPHIL %: 59 % (ref 43–76)
PLATELETS: 301 10*3/uL (ref 150–450)
RBC: 3.52 10*6/uL — ABNORMAL LOW (ref 4.00–5.10)
RDW: 13.8 % (ref 11.0–16.0)
WBC: 8 10*3/uL (ref 4.0–11.0)

## 2019-07-12 LAB — ECG 12-LEAD
Atrial Rate: 108 {beats}/min
Calculated P Axis: 26 degrees
Calculated R Axis: 51 degrees
Calculated T Axis: 120 degrees
PR Interval: 144 ms
QRS Duration: 96 ms
QT Interval: 340 ms
QTC Calculation: 455 ms
Ventricular rate: 108 {beats}/min

## 2019-07-12 LAB — BASIC METABOLIC PANEL
ANION GAP: 5 mmol/L (ref 4–13)
BUN/CREA RATIO: 8 (ref 6–22)
BUN: 4 mg/dL — ABNORMAL LOW (ref 8–25)
CALCIUM: 8 mg/dL — ABNORMAL LOW (ref 8.8–10.2)
CHLORIDE: 94 mmol/L — ABNORMAL LOW (ref 96–111)
CO2 TOTAL: 36 mmol/L — ABNORMAL HIGH (ref 23–31)
CREATININE: 0.48 mg/dL — ABNORMAL LOW (ref 0.60–1.05)
ESTIMATED GFR: >90 mL/min/BSA (ref >=60)
GLUCOSE: 139 mg/dL — ABNORMAL HIGH (ref 65–125)
POTASSIUM: 3.1 mmol/L — ABNORMAL LOW (ref 3.5–5.1)
SODIUM: 135 mmol/L — ABNORMAL LOW (ref 136–145)

## 2019-07-12 LAB — URINE CULTURE,ROUTINE: URINE CULTURE: NO GROWTH

## 2019-07-12 MED ORDER — MAGNESIUM SULFATE 2 GRAM/50 ML (4 %) IN WATER INTRAVENOUS PIGGYBACK
2.0000 g | INJECTION | Freq: Once | INTRAVENOUS | Status: AC
Start: 2019-07-12 — End: 2019-07-12
  Administered 2019-07-12: 0 g via INTRAVENOUS
  Administered 2019-07-12: 2 g via INTRAVENOUS
  Filled 2019-07-12: qty 50

## 2019-07-12 MED ORDER — TIOTROPIUM BROMIDE 2.5 MCG/ACTUATION MIST FOR INHALATION
2.0000 | Freq: Every day | RESPIRATORY_TRACT | Status: DC
Start: 2019-07-12 — End: 2019-07-14
  Administered 2019-07-13: 2 via RESPIRATORY_TRACT
  Filled 2019-07-12: qty 4

## 2019-07-12 MED ORDER — POTASSIUM CHLORIDE ER 20 MEQ TABLET,EXTENDED RELEASE(PART/CRYST)
40.00 meq | ORAL_TABLET | Freq: Two times a day (BID) | ORAL | Status: AC
Start: 2019-07-12 — End: 2019-07-12
  Administered 2019-07-12 (×2): 40 meq via ORAL
  Filled 2019-07-12 (×2): qty 2

## 2019-07-12 MED ORDER — FUROSEMIDE 10 MG/ML INJECTION SOLUTION
20.0000 mg | Freq: Once | INTRAMUSCULAR | Status: AC
Start: 2019-07-12 — End: 2019-07-12
  Administered 2019-07-12: 20 mg via INTRAVENOUS
  Filled 2019-07-12: qty 2

## 2019-07-12 MED ORDER — METOPROLOL SUCCINATE ER 50 MG TABLET,EXTENDED RELEASE 24 HR
75.00 mg | ORAL_TABLET | Freq: Every day | ORAL | Status: DC
Start: 2019-07-12 — End: 2019-07-14
  Administered 2019-07-12 – 2019-07-13 (×2): 75 mg via ORAL
  Filled 2019-07-12 (×2): qty 1

## 2019-07-12 NOTE — Progress Notes (Signed)
IP PROGRESS NOTE      [name], [name]  Date of Admission:  07/11/2019  Date of Birth:  09-23-1955  Date of Service:  07/12/2019    Chief Complaint at Presentation and Brief Summary:  Presented with lower extremity swelling and rash, admitted due to sepsis secondary to cellulitis    Subjective:  Patient is feeling slightly better.  Continues to have shortness of breath.  Denies any chest pain or abdominal pain.  Lower extremities are tender from time to time.      Assessment/ Plan:      Sepsis secondary to lower extremity cellulitis, more severe at right lower extremity however bilateral extremities are involved  --continue following blood and wound culture results, negative to this date  --empiric antibiotics with cefepime and vancomycin  --consult wound care    Chronic hypoxic respiratory failure, on 3-4L home O2  COPD, not in acute exacerbation  --discontinue DuoNeb due to tachycardia  --continue Symbicort  --add Spiriva  --p.r.n. albuterol  --supplemental oxygen    Hypokalemia  Hypomagnesemia  --replace potassium and magnesium    Paroxysmal atrial fibrillation  --continue anticoagulation with Eliquis  --continue home metoprolol  --telemetry monitoring     HFpEF, not in acute exacerbation  Pulmonary congestion and signs of volume overload+   --Lasix 20 mg IV once today  --increase metoprolol to 75 mg p.o. q.day  --continue apixaban and statin    Uncontrolled type 2 diabetes, insulin-dependent, hyperglycemic  --Lantus 30 units b.i.d.  --blood glucose checks and insulin sliding scale    Tobacco dependency  --counseling performed      VTE prophylaxis:  Apixaban  Disposition:  Home in 3-4 days       Vital Signs:  Temp (24hrs) Max:37.3 C (99.1 F)      Temperature: 36.6 C (97.9 F)  BP (Non-Invasive): 126/61  MAP (Non-Invasive): 78 mmHG  Heart Rate: (!) 115 (rn notified)  Respiratory Rate: 20  SpO2: 97 %    Current Medications:  acetaminophen (TYLENOL) tablet, 650 mg, Oral, Q6H PRN  albuterol (PROVENTIL) 2.5mg /  0.5 mL nebulizer solution, 2.5 mg, Nebulization, Q4H PRN  apixaban (ELIQUIS) tablet, 5 mg, Oral, Q12H  atorvastatin (LIPITOR) tablet, 40 mg, Oral, QPM  budesonide-formoterol (SYMBICORT) 160 mcg-4.5 mcg per inhalation oral inhaler - "Respiratory to administer", 2 Puff, Inhalation, 2x/day  cefepime (MAXIPIME) 2 g in NS 100 mL IVPB, 2 g, Intravenous, Q12H  SSIP insulin lispro (HUMALOG) 100 units/mL SubQ pen, 1-12 Units, Subcutaneous, 4x/day AC   And  dextrose 50% (0.5 g/mL) injection - syringe, 12.5 g, Intravenous, Q15 Min PRN  insulin glargine 100 units/mL SubQ pen, 30 Units, Subcutaneous, 2x/day  lisinopril (PRINIVIL) tablet, 5 mg, Oral, Daily  magnesium sulfate 2 G in SW 50 mL premix IVPB, 2 g, Intravenous, Once  metoprolol succinate (TOPROL-XL) 24 hr extended release tablet 75 mg, 75 mg, Oral, Daily  morphine 4 mg/mL injection, 4 mg, Intravenous, Q5 Min PRN  nitroGLYCERIN (NITROSTAT) sublingual tablet, 0.4 mg, Sublingual, Q5 Min PRN  NS 250 mL flush bag, , Intravenous, Q1H PRN  NS flush syringe, 10 mL, Intravenous, Q8HRS  NS flush syringe, 10 mL, Intravenous, Q1H PRN  Pharmacy Vancomycin and/or Aminoglycoside Consult, , Intravenous, Once PRN  potassium chloride (K-DUR) extended release tablet, 40 mEq, Oral, 2x/day-Food  tiotropium bromide (SPIRIVA RESPIMAT) 2.5 mcg per inhalation oral inhaler - "Respiratory to administer", 2 Puff, Inhalation, Daily  vancomycin (VANCO READY) 1.25 g in sterile water 250 mL premix IVPB, 15 mg/kg, Intravenous, Q12H  Today's Physical Exam:  General: appears chronically ill, not in acute distress  Eyes: Pupils equal and round, reactive to light and accomodation.   HEENT: Head atraumatic and normocephalic   Neck: No JVD or thyromegaly    Lungs:  Bilateral wheezing in all lung fields present, crackles+   Cardiovascular: S1S2+ rhythm is irregularly irregular  Abdomen: Not distended, bowel sounds normoactive, abdomen is Soft, non-tender with palpation  Extremities:  2+ bilateral  pitting edema at lower extremity, no improved  Skin:  Bilateral skin rash covering from foot to knee with blisters, unchanged comparing to yesterday  Neurologic: Grossly normal   Psychiatric: Normal affect, behavior      ROS reviewed and negative except stated in HPI and Subjective      Labs  Please indicate ordered or reviewed)  Reviewed:   I have reviewed all lab results.  Lab Results for Last 24 Hours:    Results for orders placed or performed during the hospital encounter of 07/11/19 (from the past 24 hour(s))   COMPREHENSIVE METABOLIC PANEL, NON-FASTING   Result Value Ref Range    SODIUM 134 (L) 136 - 145 mmol/L    POTASSIUM 2.9 (L) 3.5 - 5.1 mmol/L    CHLORIDE 89 (L) 96 - 111 mmol/L    CO2 TOTAL 36 (H) 23 - 31 mmol/L    ANION GAP 9 4 - 13 mmol/L    BUN 4 (L) 8 - 25 mg/dL    CREATININE 0.61 0.60 - 1.05 mg/dL    BUN/CREA RATIO 7 6 - 22    ESTIMATED GFR >90 >=60 mL/min/BSA    ALBUMIN 2.4 (L) 3.4 - 4.8 g/dL     CALCIUM 8.4 (L) 8.8 - 10.2 mg/dL    GLUCOSE 307 (H) 65 - 125 mg/dL    ALKALINE PHOSPHATASE 85 50 - 130 U/L    ALT (SGPT) 9 8 - 22 U/L    AST (SGOT)  9 8 - 45 U/L    BILIRUBIN TOTAL 0.9 0.3 - 1.3 mg/dL    PROTEIN TOTAL 6.2 6.0 - 8.0 g/dL   C-REACTIVE PROTEIN(CRP),INFLAMMATION   Result Value Ref Range    CRP INFLAMMATION 126.6 (H) <8.0 mg/L   LACTIC ACID LEVEL W/ REFLEX FOR LEVEL >2.0   Result Value Ref Range    LACTIC ACID 1.8 0.5 - 2.2 mmol/L   PT/INR   Result Value Ref Range    PROTHROMBIN TIME 23.5 (H) 9.4 - 12.5 seconds    INR 2.01    PTT (PARTIAL THROMBOPLASTIN TIME)   Result Value Ref Range    APTT 39.7 (H) 25.1 - 36.5 seconds   TROPONIN-I   Result Value Ref Range    TROPONIN I <7 (L) 7 - 30 ng/L   CBC WITH DIFF   Result Value Ref Range    WBC 8.7 4.0 - 11.0 x10^3/uL    RBC 3.92 (L) 4.00 - 5.10 x10^6/uL    HGB 11.7 (L) 12.0 - 15.5 g/dL    HCT 35.3 (L) 36.0 - 45.0 %    MCV 90.0 82.0 - 97.0 fL    MCH 29.7 27.5 - 33.2 pg    MCHC 33.1 32.0 - 36.0 g/dL    RDW 14.1 11.0 - 16.0 %    PLATELETS 312 150 - 450  x10^3/uL    MPV 8.0 7.4 - 10.5 fL    NEUTROPHIL % 67 43 - 76 %    LYMPHOCYTE % 14 (L) 15 - 43 %    MONOCYTE %  16 (H) 5 - 12 %    EOSINOPHIL % 3 0 - 5 %    BASOPHIL % 1 0 - 3 %    NEUTROPHIL # 5.80 1.50 - 6.50 x10^3/uL    LYMPHOCYTE # 1.20 1.00 - 4.80 x10^3/uL    MONOCYTE # 1.40 (H) 0.20 - 0.90 x10^3/uL    EOSINOPHIL # 0.20 0.00 - 0.50 x10^3/uL    BASOPHIL # 0.10 0.00 - 0.10 x10^3/uL   MAGNESIUM   Result Value Ref Range    MAGNESIUM 1.7 (L) 1.8 - 2.6 mg/dL   WOUND, SUPERFICIAL/NON-STERILE SITE, AEROBIC CULTURE AND GRAM STAIN    Specimen: Wound; Other   Result Value Ref Range    GRAM STAIN 1+ Rare WBCs     GRAM STAIN No Organisms Seen    GOLD TOP TUBE   Result Value Ref Range    RAINBOW/EXTRA TUBE AUTO RESULT Yes    RED TOP TUBE   Result Value Ref Range    RAINBOW/EXTRA TUBE AUTO RESULT Yes    URINALYSIS, MACROSCOPIC   Result Value Ref Range    COLOR Light Yellow Light Yellow, Straw, Yellow    APPEARANCE Clear Clear    PH 6.5 <8.0    LEUKOCYTES Trace (A) Negative WBCs/uL    NITRITE Negative Negative    PROTEIN Negative Negative, 10  mg/dL    GLUCOSE 500  (A) Negative mg/dL    KETONES Negative Negative mg/dL    UROBILINOGEN >= 8.0 (A) <=2.0 mg/dL    BILIRUBIN Negative Negative mg/dL    BLOOD Negative Negative mg/dL    SPECIFIC GRAVITY 1.010 <1.022   URINE CULTURE,ROUTINE    Specimen: Urine   Result Value Ref Range    URINE CULTURE No Growth 18-24 hrs.    URINALYSIS, MICROSCOPIC   Result Value Ref Range    RBCS None 0 - 2 /hpf    WBCS 2-5 (A) 0 - 2 /hpf    BACTERIA Slight (A) None /hpf    SQUAMOUS EPITHELIAL 2-5 (A) 0 - 2 /hpf    MUCOUS Marked (A) None, Light /hpf    HYALINE CASTS 2-5 (A) 0 - 2 /lpf   COVID-19 SCREENING (COVID only)   Result Value Ref Range    SARS-CoV-2 Not Detected Not Detected   ECG 12-LEAD   Result Value Ref Range    Ventricular rate 108 BPM    Atrial Rate 108 BPM    PR Interval 144 ms    QRS Duration 96 ms    QT Interval 340 ms    QTC Calculation 455 ms    Calculated P Axis 26 degrees    Calculated  R Axis 51 degrees    Calculated T Axis 120 degrees   POC FINGERSTICK GLUCOSE - [facility] (RESULTS)   Result Value Ref Range    GLUCOSE, POC 331 (H) 60 - 100 mg/dl   BASIC METABOLIC PANEL   Result Value Ref Range    SODIUM 135 (L) 136 - 145 mmol/L    POTASSIUM 3.1 (L) 3.5 - 5.1 mmol/L    CHLORIDE 94 (L) 96 - 111 mmol/L    CO2 TOTAL 36 (H) 23 - 31 mmol/L    ANION GAP 5 4 - 13 mmol/L    CALCIUM 8.0 (L) 8.8 - 10.2 mg/dL    GLUCOSE 139 (H) 65 - 125 mg/dL    BUN 4 (L) 8 - 25 mg/dL    CREATININE 0.48 (L) 0.60 - 1.05 mg/dL    BUN/CREA RATIO 8  6 - 22    ESTIMATED GFR >90 >=60 mL/min/BSA   CBC WITH DIFF   Result Value Ref Range    WBC 8.0 4.0 - 11.0 x10^3/uL    RBC 3.52 (L) 4.00 - 5.10 x10^6/uL    HGB 10.6 (L) 12.0 - 15.5 g/dL    HCT 31.8 (L) 36.0 - 45.0 %    MCV 90.3 82.0 - 97.0 fL    MCH 30.2 27.5 - 33.2 pg    MCHC 33.4 32.0 - 36.0 g/dL    RDW 13.8 11.0 - 16.0 %    PLATELETS 301 150 - 450 x10^3/uL    MPV 8.0 7.4 - 10.5 fL    NEUTROPHIL % 59 43 - 76 %    LYMPHOCYTE % 19 15 - 43 %    MONOCYTE % 17 (H) 5 - 12 %    EOSINOPHIL % 5 0 - 5 %    BASOPHIL % 0 0 - 3 %    NEUTROPHIL # 4.70 1.50 - 6.50 x10^3/uL    LYMPHOCYTE # 1.50 1.00 - 4.80 x10^3/uL    MONOCYTE # 1.40 (H) 0.20 - 0.90 x10^3/uL    EOSINOPHIL # 0.40 0.00 - 0.50 x10^3/uL    BASOPHIL # 0.00 0.00 - 0.10 x10^3/uL   POC FINGERSTICK GLUCOSE - [facility] (RESULTS)   Result Value Ref Range    GLUCOSE, POC 114 (H) 60 - 100 mg/dl   POC FINGERSTICK GLUCOSE - [facility] (RESULTS)   Result Value Ref Range    GLUCOSE, POC 174 (H) 60 - 100 mg/dl           Radiology Tests (Please indicate ordered or reviewed)  Reviewed images from this admission:   Results for orders placed or performed during the hospital encounter of 07/11/19 (from the past 24 hour(s))   XR AP MOBILE CHEST     Status: None    Narrative    RADIOLOGIST: Lars Masson, MD    EXAMINATION: XR AP MOBILE CHEST     EXAM DATE/TIME: 07/11/2019 3:44 PM    CLINICAL INDICATION: fever    COMPARISON: None.    FINDINGS:     Cardiovascular:    The cardiac size is prominent.    Lungs:   There is mild prominence of vascular markings.    Pleural spaces:   Small left pleural effusion.    Chest wall:   No acute findings.      Impression    Cardiomegaly.    Mild pulmonary vascular congestion. Small left pleural effusion.        Radiologist location ID: [id number]          Problem List:  Active Hospital Problems   (*Primary Problem)    Diagnosis   . *Sepsis due to cellulitis (CMS [facility])   . COPD (chronic obstructive pulmonary disease) (CMS [facility])   . Hypokalemia   . Atrial fibrillation (CMS [facility])   . Tobacco use disorder   . Chronic hypoxemic respiratory failure (CMS [facility])     PATIENT IS GOING TO GO HOME WITH 2 LITERS OF O2 BY NASAL CANNULA PER MINUTE.            Cliffton Asters, MD    Portions of this note may be dictated using voice recognition software or a dictation service. Variances in spelling and vocabulary are possible and unintentional. Not all errors are caught/corrected. Please notify the [facility] if any discrepancies are noted or if the meaning of any statement is not clear.

## 2019-07-12 NOTE — Care Management Notes (Signed)
07/12/19 1019   Assessment Detail   Assessment Type Admission   Date of Care Management Update 07/12/19   Readmission   Is this a readmission? No   Social Work Animal nutritionist Status initial meeting   Anticipated Discharge Disposition Home   Discharge Needs Assessment   Concerns To Be Addressed discharge planning concerns   Equipment Currently Used at Home nebulizer;respiratory supplies;oxygen   Equipment Needed After Discharge oxygen;nebulizer;respiratory supplies   Transportation Available car;family or friend will provide   Referral Information   Admission Type inpatient   Living Environment   Lives With spouse;child(ren), adult   Living Arrangements apartment   Able to Return to Prior Arrangements yes   I spoke to [name] to discharge discharge planning needs.  "I live with my husband and my children."  "I am active with Dr Sabra Heck."  "I use the [facility] in [place] for my prescriptions."  "I have home O2 and a nebulizer from [place] at home."  "I sure hope I can be discharged home on oral antibiotics."  I explained that cases management will be following her case to assist with discharge planning needs.  Please consult CM for anticipated discharge planning needs.

## 2019-07-12 NOTE — Care Plan (Signed)
Problem: Adult Inpatient Plan of Care  Goal: Plan of Care Review  Outcome: Ongoing (see interventions/notes)  Goal: Patient-Specific Goal (Individualized)  Outcome: Ongoing (see interventions/notes)  Goal: Absence of Hospital-Acquired Illness or Injury  Outcome: Ongoing (see interventions/notes)  Goal: Optimal Comfort and Wellbeing  Outcome: Ongoing (see interventions/notes)  Goal: Rounds/Family Conference  Outcome: Ongoing (see interventions/notes)

## 2019-07-12 NOTE — Care Plan (Signed)
I spoke to [name] to discharge discharge planning needs.  "I live with my husband and my children."  "I am active with Dr Sabra Heck."  "I use the [facility] in [place] for my prescriptions."  "I have home O2 and a nebulizer from [place] at home."  "I sure hope I can be discharged home on oral antibiotics."  I explained that cases management will be following her case to assist with discharge planning needs.  Please consult CM for anticipated discharge planning needs.

## 2019-07-12 NOTE — Nurses Notes (Signed)
07/12/19 0723   SBAR   Patient ID Verification (Scan Armband): [id number]   Code status band matches order Yes   SBAR given: Yes   Handoff report given to: Shelly RN   Dashboard Updated Yes   Dashboard Reviewed Yes

## 2019-07-12 NOTE — Nurses Notes (Signed)
WOUND INITIAL EVALUATION    Referring Service- Dr. Suzanna Obey  Admission date - 07/11/2019   Diagnosis- Lower extremity cellulitis   Patient Active Problem List    Diagnosis Date Noted   . Sepsis due to cellulitis (CMS [place]) 07/11/2019   . COPD exacerbation (CMS [place]) 06/26/2019   . Atrial fibrillation with rapid ventricular response (CMS [facility]) 06/18/2019   . Acute hypoxemic respiratory failure (CMS [facility]) 12/04/2018   . Sepsis 10/27/2018   . CAP (community acquired pneumonia) 10/26/2018   . Acute exacerbation of chronic obstructive pulmonary disease (COPD) (CMS [facility]) 03/27/2018   . COPD with acute exacerbation (CMS [facility]) 05/23/2017   . Tobacco use disorder 05/22/2017   . Acute respiratory failure (CMS [facility]) 05/21/2017   . Non-compliance 03/27/2017   . 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 Diabetes (CMS [facility]) 11/18/2015   . Obesity (BMI 35.0-39.9 without comorbidity) 10/08/2012       Allergies:  Allergies   Allergen Reactions   . Codeine Nausea/ Vomiting     Sick to stomach   . Hydrocodone Nausea/ Vomiting   . Metformin Diarrhea   . Oxycodone Nausea/ Vomiting       BP Readings from Last 1 Encounters:   07/12/19 (!) 140/69     Pulse Readings from Last 1 Encounters:   07/12/19 (!) 114     Temp Readings from Last 1 Encounters:   07/12/19 36.7 C (98.1 F)     Resp Readings from Last 1 Encounters:   07/12/19 20       Wound Related History: 64 yo female presented to the ED with bilateral lower extremity swelling and blisters. History of COPD, DM, HTN, smoker, oxygen dependent.   Consulted to see the patient for lower extremity cellulitis. The lower legs have intact yellow blisters that are draining minimal serous fluid. The dorsal feet have open partial thickness wounds that have dried serous fluid with hair and debris. Due to pain the patient cannot tolerated washing the feet at this time.   Erythema was marked for tracking progress.     WBC   Date/Time Value Ref Range Status    07/12/2019 04:52 AM 8.0 4.0 - 11.0 x10^3/uL Final     HCT   Date/Time Value Ref Range Status   07/12/2019 04:52 AM 31.8 (L) 36.0 - 45.0 % Final     ALBUMIN   Date/Time Value Ref Range Status   07/11/2019 03:00 PM 2.4 (L) 3.4 - 4.8 g/dL  Final     HGB   Date/Time Value Ref Range Status   07/12/2019 04:52 AM 10.6 (L) 12.0 - 15.5 g/dL Final     HEMOGLOBIN A1C   Date/Time Value Ref Range Status   10/26/2018 05:15 PM 12.0 (H) 4.0 - 5.6 % Final          07/12/19 1100   Wound (Non-Surgical) Anterior;Lower;Right Leg   Initial Date of Wound Assessment/Initial Time of Wound Assessment: 07/11/19 2141   Present on Hospital Admission: Yes  Primary Wound Type: Other (comment)  Orientation: Anterior;Lower;Right  Location: Leg   Wound Image    Wound Characteristics   (Cellulitis)   WOUND SITE CLOSURE None   Periwound Red;Warmth;Edema   Wound Bed red  (intact skin)   Wound Drainage Amount None   Wound Odor Absent   DRESSING TYPE Open to Air   Wound (Non-Surgical) Anterior;Left;Lower Leg   Initial Date of Wound Assessment/Initial Time of Wound Assessment: 07/11/19 2142   Present on Hospital Admission: Yes  Primary Wound Type: Other (comment)  Orientation: Anterior;Left;Lower  Location: Leg   Wound Image    Wound Characteristics Blister   WOUND SITE CLOSURE None   Periwound Edema;Warmth;Red   Wound Bed yellow  (blister)   Wound Length (cm) 8 cm   Wound Width (cm) 6 cm   Wound Depth (cm) 0.2 cm   Wound Volume (cm^3) 9.6 cm^3   Wound Surface Area (cm^2) 48 cm^2   Wound Undermining No   Wound Tunneling  No   Wound Drainage Amount Scant   Exudate/Drainage Serous   Wound Odor Absent   DRESSING TYPE Open to Air   Wound (Non-Surgical) Left;Lower;Medial Leg   Initial Date of Wound Assessment/Initial Time of Wound Assessment: 07/11/19 2143   Present on Hospital Admission: Yes  Primary Wound Type: Other (comment)  Orientation: Left;Lower;Medial  Location: Leg   Wound Image    Wound Characteristics Blister   WOUND SITE CLOSURE None   Periwound  Edema;Red;Warmth   Wound Bed yellow  (blister)   Wound Length (cm) 11 cm   Wound Width (cm) 4 cm   Wound Depth (cm) 0.2 cm   Wound Volume (cm^3) 8.8 cm^3   Wound Surface Area (cm^2) 44 cm^2   Wound Undermining No   Wound Tunneling  No   Wound Drainage Amount Scant   Exudate/Drainage Serous   Wound Odor Absent   DRESSING TYPE Open to Air   Wound (Non-Surgical) Lower;Right;Medial Leg   Initial Date of Wound Assessment/Initial Time of Wound Assessment: 07/11/19 2143   Present on Hospital Admission: Yes  Primary Wound Type: Other (comment)  Orientation: Lower;Right;Medial  Location: Leg   Wound Image    Wound Characteristics Blister   WOUND SITE CLOSURE None   Periwound Edema;Red;Warmth   Wound Bed yellow  (blisters)   Wound Length (cm) 15 cm   Wound Width (cm) 15 cm   Wound Depth (cm) 0.2 cm   Wound Volume (cm^3) 45 cm^3   Wound Surface Area (cm^2) 225 cm^2   Wound Undermining No   Wound Tunneling  No   Wound Drainage Amount Small   Exudate/Drainage Serous   Wound Odor Absent   DRESSING TYPE Open to Air  (chux)   Wound (Non-Surgical) Left;Dorsal Foot   Initial Date of Wound Assessment/Initial Time of Wound Assessment: 07/12/19 1151   Present on Hospital Admission: Yes  Primary Wound Type: (c) Other (comment)  Orientation: Left;Dorsal  Location: Foot   Wound Image    Wound Characteristics   (cellulitis)   WOUND SITE CLOSURE None   Periwound Edema;Red;Warmth   Wound Bed red   Wound Length (cm) 6 cm   Wound Width (cm) 7 cm   Wound Depth (cm) 0.1 cm   Wound Volume (cm^3) 4.2 cm^3   Wound Surface Area (cm^2) 42 cm^2   Wound Undermining No   Wound Tunneling  No   Wound Drainage Amount Small   Exudate/Drainage Serous   Wound Odor Absent   DRESSING TYPE Open to Air   Wound (Non-Surgical) Right;Dorsal Foot   Initial Date of Wound Assessment/Initial Time of Wound Assessment: 07/12/19 1153   Present on Hospital Admission: Yes  Primary Wound Type: (c) Other (comment)  Orientation: Right;Dorsal  Location: Foot   Wound Image     Wound Characteristics   (cellulitis)   WOUND SITE CLOSURE None   Periwound Red;Warmth;Edema   Wound Bed red   Wound Length (cm) 7 cm   Wound Width (cm) 7 cm   Wound Depth (cm) 0.1  cm   Wound Volume (cm^3) 4.9 cm^3   Wound Surface Area (cm^2) 49 cm^2   Wound Undermining No   Wound Tunneling  No   Wound Drainage Amount Small   Exudate/Drainage Serous   Wound Odor Absent   DRESSING TYPE Open to Air       Recommendations & Treatments:   Wash legs and feet with hibiclens when patient can tolerate  Cover dorsal feet with xeroform, ABD and kerlix  Follow up [place]  Vein mapping and PVRs    Preventive Measures: Bed mobility present

## 2019-07-12 NOTE — Pharmacy (Signed)
[facility] / Department of Pharmaceutical Services  Therapeutic Drug Monitoring: Vancomycin  07/12/2019      Patient name: [name], [name]  Date of Birth:  29-Nov-1955    Actual Weight:  Weight: 87.1 kg (192 lb) (07/11/19 1334)     BMI:  BMI (Calculated): 34.08 (07/11/19 1334)    Date RPh Current regimen (including mg/kg) Indication AUC or trough based dosing Target Levels^ SCr (mg/dL) CrCl* (mL/min) Measured level(s)   (mcg/mL) Calculated AUC (if AUC based monitoring) Plan (including when levels are due) Comments   7/1 jpf 1250 mg x1 Sepsis d/t BLE cellulitis  AUC 400-600 0.61 100.1  469 1250 mg q12h  Levels 7/3    Vanc 1250 mg q12h    7/2 CC Vanc 1250mg  iv q12h    0.48 127.2   Vanc peak 7/3 @ 0900 and trough @ 16:00 Monitor renal function                                                                                                                     ^Goal Target depends on dosing and monitoring method, AUC vs. trough based. For AUC based dosing units are mg*h/L. For trough based dosing units are mcg/mL.     *Creatinine clearance is estimated by using the Cockcroft-Gault equation for adult patients and the Jakolby Sedivy Ada for pediatric patients.    The decision to discontinue vancomycin therapy will be determined by the primary service.  Please contact the pharmacist with any questions regarding this patient's medication regimen.    Bonney Aid, PHARMD  07/12/2019, 08:16

## 2019-07-13 LAB — CBC W/AUTO DIFF
BASOPHIL #: 0 10*3/uL (ref 0.00–0.10)
BASOPHIL %: 1 % (ref 0–3)
EOSINOPHIL #: 0.4 10*3/uL (ref 0.00–0.50)
EOSINOPHIL %: 4 % (ref 0–5)
HCT: 33.4 % — ABNORMAL LOW (ref 36.0–45.0)
HGB: 11 g/dL — ABNORMAL LOW (ref 12.0–15.5)
LYMPHOCYTE #: 1.3 10*3/uL (ref 1.00–4.80)
LYMPHOCYTE %: 13 % — ABNORMAL LOW (ref 15–43)
MCH: 29.7 pg (ref 27.5–33.2)
MCHC: 32.9 g/dL (ref 32.0–36.0)
MCV: 90.3 fL (ref 82.0–97.0)
MONOCYTE #: 1.4 10*3/uL — ABNORMAL HIGH (ref 0.20–0.90)
MONOCYTE %: 14 % — ABNORMAL HIGH (ref 5–12)
MPV: 7.8 fL (ref 7.4–10.5)
NEUTROPHIL #: 7.1 10*3/uL — ABNORMAL HIGH (ref 1.50–6.50)
NEUTROPHIL %: 70 % (ref 43–76)
PLATELETS: 356 10*3/uL (ref 150–450)
RBC: 3.7 10*6/uL — ABNORMAL LOW (ref 4.00–5.10)
RDW: 14.1 % (ref 11.0–16.0)
WBC: 10.1 10*3/uL (ref 4.0–11.0)

## 2019-07-13 LAB — WOUND, SUPERFICIAL/NON-STERILE SITE, AEROBIC CULTURE AND GRAM STAIN
GRAM STAIN: NONE SEEN
WOUND CULTURE: NORMAL

## 2019-07-13 LAB — POC FINGERSTICK GLUCOSE - BMC/JMC (RESULTS)
GLUCOSE, POC: 81 mg/dL (ref 60–100)
GLUCOSE, POC: 141 mg/dL — ABNORMAL HIGH (ref 60–100)
GLUCOSE, POC: 182 mg/dL — ABNORMAL HIGH (ref 60–100)
GLUCOSE, POC: 176 mg/dL — ABNORMAL HIGH (ref 60–100)

## 2019-07-13 LAB — BASIC METABOLIC PANEL
ANION GAP: 7 mmol/L (ref 4–13)
BUN/CREA RATIO: 9 (ref 6–22)
BUN: 5 mg/dL — ABNORMAL LOW (ref 8–25)
CALCIUM: 8.3 mg/dL — ABNORMAL LOW (ref 8.8–10.2)
CHLORIDE: 99 mmol/L (ref 96–111)
CO2 TOTAL: 34 mmol/L — ABNORMAL HIGH (ref 23–31)
CREATININE: 0.53 mg/dL — ABNORMAL LOW (ref 0.60–1.05)
ESTIMATED GFR: >90 mL/min/BSA (ref >=60)
GLUCOSE: 150 mg/dL — ABNORMAL HIGH (ref 65–125)
POTASSIUM: 3.7 mmol/L (ref 3.5–5.1)
SODIUM: 140 mmol/L (ref 136–145)

## 2019-07-13 LAB — VANCOMYCIN PEAK: VANCOMYCIN PEAK: 18.8 ug/mL — ABNORMAL LOW (ref 30.0–40.0)

## 2019-07-13 LAB — VANCOMYCIN, TROUGH: VANCOMYCIN TROUGH: 9.4 ug/mL — ABNORMAL LOW (ref 10.0–20.0)

## 2019-07-13 MED ORDER — INSULIN GLARGINE (U-100) 100 UNIT/ML (3 ML) SUBCUTANEOUS PEN
20.00 [IU] | PEN_INJECTOR | Freq: Every evening | SUBCUTANEOUS | Status: DC
Start: 2019-07-13 — End: 2019-07-14
  Administered 2019-07-13: 20 [IU] via SUBCUTANEOUS

## 2019-07-13 MED ORDER — INSULIN GLARGINE (U-100) 100 UNIT/ML (3 ML) SUBCUTANEOUS PEN
30.00 [IU] | PEN_INJECTOR | Freq: Every day | SUBCUTANEOUS | Status: DC
Start: 2019-07-13 — End: 2019-07-14
  Administered 2019-07-13 – 2019-07-14 (×2): 30 [IU] via SUBCUTANEOUS

## 2019-07-13 MED ORDER — FUROSEMIDE 10 MG/ML INJECTION SOLUTION
20.0000 mg | Freq: Once | INTRAMUSCULAR | Status: AC
Start: 2019-07-13 — End: 2019-07-13
  Administered 2019-07-13: 20 mg via INTRAVENOUS
  Filled 2019-07-13: qty 2

## 2019-07-13 NOTE — Nurses Notes (Signed)
07/13/19 0708   [facility] Fall Risk Assessment Tool   Fall Risk Assessment Shift Assessment   Fall Risk- Implement fall risk interventions per protocol 0   Age 64   Fall History 0   Elimination, Bowel and Urine 0   Medications (Fall risk drugs: PCA/Opiates, Anti-Convulsants, Anti-Hypertensives, Diuretics, Hypnotics, Laxatives, Sedatives and Psychotropics) 3   Patient Care Equipment 1   Mobility (Yes on any of these rows will result in High Fall Risk)   Requires assistance or supervision for mobility, transfer, or ambulation 0   Unsteady Gait 0   Visual or auditory impairment affecting mobility 0   Cognition (Yes on any of these rows will result in High Fall Risk)   Altered awareness of immediate physical environment 0   Impulsive 0   Lack of understanding of one's physical and cognitive limitations 0   [facility] Score and Interventions   [employer] Score Total 5   [facility] Identified Fall Risk Auto Low Risk (1-5 total points)   Fall Risk Interventions (All That Apply) (L,M,H) Universal Falls Precautions;(M,H) Institute/Continue Flagging System;(M,H) Monitor and assist with ADLs;(H) REMAIN WITH PATIENT WHILE TOILETING (within constant visual/touch);(H) Activated motions sensor alarm and pad (sitter select);(H) Transport with assist;(M,H) Need for PT consult;(M,H) Need for OT consult   Safety   Code status band matches order Yes   SBAR   Patient ID Verification (Scan Armband): \[id number]\   SBAR given: Yes   Handoff report given to: beverly,rn-rich,rn   Dashboard Updated Yes   Dashboard Reviewed Yes

## 2019-07-13 NOTE — Care Plan (Signed)
Problem: Adult Inpatient Plan of Care  Goal: Plan of Care Review  Outcome: Ongoing (see interventions/notes)  Goal: Optimal Comfort and Wellbeing  Outcome: Ongoing (see interventions/notes)     Problem: Diabetes Comorbidity  Goal: Blood Glucose Level Within Targeted Range  Outcome: Ongoing (see interventions/notes)     Problem: Skin or Soft Tissue Infection  Goal: Infection Symptom Resolution  Outcome: Ongoing (see interventions/notes)    Pt refuses to have this nurse wash legs and feet with hibiclens and cover dorsal feet with xeroform/abd/kerlix.  Pt denies pain at this moment. Pt resting comfortably with call bell within reach.

## 2019-07-13 NOTE — Pharmacy (Addendum)
[place] / [facility]  Therapeutic Drug Monitoring: Vancomycin  07/13/2019      Patient name: [name], [name]  Date of Birth:  March 27, 1955    Actual Weight:  Weight: 87.1 kg (192 lb) (07/11/19 1334)     BMI:  BMI (Calculated): 34.08 (07/11/19 1334)    Date RPh Current regimen (including mg/kg) Indication AUC or trough based dosing Target Levels^ SCr (mg/dL) CrCl* (mL/min) Measured level(s)   (mcg/mL) Calculated AUC (if AUC based monitoring) Plan (including when levels are due) Comments   7/1 jpf 1250 mg x1 Sepsis d/t BLE cellulitis  AUC 400-600 0.61 100.1  469 1250 mg q12h  Levels 7/3    Vanc 1250 mg q12h    7/2 CC Vanc 1250mg  iv q12h    0.48 127.2   Vanc peak 7/3 @ 0900 and trough @ 16:00 Monitor renal function   7/3 GS Vancomycin 1250 mg IV Q12H SSTI AUC 400-600   Pk = 18.8  No changes In 480  Voiding per flowsheets                                                                                                       Barrie Folk Target depends on dosing and monitoring method, AUC vs. trough based. For AUC based dosing units are mg*h/L. For trough based dosing units are mcg/mL.     *Creatinine clearance is estimated by using the Cockcroft-Gault equation for adult patients and the Carol Ada for pediatric patients.    The decision to discontinue vancomycin therapy will be determined by the primary service.  Please contact the pharmacist with any questions regarding this patient's medication regimen.

## 2019-07-13 NOTE — Pharmacy (Signed)
[place] / [facility]  Therapeutic Drug Monitoring: Vancomycin  07/13/2019      Patient name: [name], [name]  Date of Birth:  23-Jun-1955    Actual Weight:  Weight: 87.1 kg (192 lb) (07/11/19 1334)     BMI:  BMI (Calculated): 34.08 (07/11/19 1334)    Date RPh Current regimen (including mg/kg) Indication AUC or trough based dosing Target Levels^ SCr (mg/dL) CrCl* (mL/min) Measured level(s)   (mcg/mL) Calculated AUC (if AUC based monitoring) Plan (including when levels are due) Comments   7/1 jpf 1250 mg x1 Sepsis d/t BLE cellulitis  AUC 400-600 0.61 100.1  469 1250 mg q12h  Levels 7/3    Vanc 1250 mg q12h    7/2 CC Vanc 1250mg  iv q12h    0.48 127.2   Vanc peak 7/3 @ 0900 and trough @ 16:00 Monitor renal function   7/3 GS Vancomycin 1250 mg IV Q12H SSTI AUC 400-600   Pk = 18.8  No changes In 480  Voiding per flowsheets   7/3 TM 1250mg  q12h  AUC 400-600 0.53 115.2 T-9.4 337 Increase to 1500mg  IV q12h to start 7/4 since nurse already hung 1250mg  dose Predicted AUC 405  Peak ordered for 7/5 PM dose  Trough ordered for 7/6 AM dose                                                                                         ^Goal Target depends on dosing and monitoring method, AUC vs. trough based. For AUC based dosing units are mg*h/L. For trough based dosing units are mcg/mL.     *Creatinine clearance is estimated by using the Cockcroft-Gault equation for adult patients and the Carol Ada for pediatric patients.    The decision to discontinue vancomycin therapy will be determined by the primary service.  Please contact the pharmacist with any questions regarding this patient's medication regimen.

## 2019-07-13 NOTE — Nurses Notes (Signed)
Bedside report received from [facility], pt appears to be resting in bed and in no distress no needs requested. Fall assessment and SBAR completed at bedside. Call bell and other personal items placed within reach. Assumes care.

## 2019-07-13 NOTE — Nurses Notes (Signed)
Admitted for cellulitis of ble's, both legs were marked on admission and again after that; the redness and edema is lessening from these marks. She receives IV cefepime and vancomycin and wound care for her cellulitis. She did allow wound care with xeroform, ABD's and kerlix wraps this am. She is tearful at times that she wants to go home. Her husband visited today and is supportive. She is diabetic, receives a diabetic diet and is eating 75 to 100% from meal trays. Her finger stick glucose is monitored AC&HS and she receives sliding scale Humalog AC&HS and scheduled Lantus BID. She has home O2 and is on O2 3L nc continuously here. She  Received IV furosemide 20 mg today due to crackles in her lung bases on auscultation. She ambulates independently to the br and is a low fall risk. She demonstrates use of the call light for assistance as needed. Bed is in the low position with her call light, phone and personal items in reach for her comfort and safety. Discharge plan is to return to her home Monday or later per her MD.

## 2019-07-13 NOTE — Care Plan (Signed)
Problem: Adult Inpatient Plan of Care  Goal: Plan of Care Review  Outcome: Ongoing (see interventions/notes)  Flowsheets (Taken 07/13/2019 2000)  Plan of Care Reviewed With: patient  Goal: Patient-Specific Goal (Individualized)  Outcome: Ongoing (see interventions/notes)  Flowsheets (Taken 07/13/2019 0900 by Horton Chin, RN)  Individualized Care Needs: none  Anxieties, Fears or Concerns: wants to go home  Patient-Specific Goals (Include Timeframe): "I want' to go home."  Goal: Absence of Hospital-Acquired Illness or Injury  Outcome: Ongoing (see interventions/notes)  Intervention: Identify and Manage Fall Risk  Flowsheets (Taken 07/13/2019 2000)  Safety Promotion/Fall Prevention:   activity supervised   safety round/check completed   nonskid shoes/slippers when out of bed  Intervention: Prevent Skin Injury  Flowsheets (Taken 07/13/2019 2000)  Body Position:   supine, head elevated   positioned/repositioned independently  Intervention: Prevent and Manage VTE (Venous Thromboembolism) Risk  Flowsheets (Taken 07/13/2019 2000)  VTE Prevention/Management:   ambulation promoted   dorsiflexion/plantar flexion performed  Intervention: Prevent Infection  Flowsheets (Taken 07/13/2019 2000)  Infection Prevention:   single patient room provided   promote handwashing   personal protective equipment utilized   glycemic control managed  Goal: Optimal Comfort and Wellbeing  Outcome: Ongoing (see interventions/notes)  Intervention: [place] (Taken 07/13/2019 2000)  Trust Relationship/Rapport:   care explained   choices provided   emotional support provided   empathic listening provided   questions answered   questions encouraged   reassurance provided  Goal: Rounds/Family Conference  Outcome: Ongoing (see interventions/notes)     Problem: Functional Ability Impaired COPD (Chronic Obstructive Pulmonary Disease)  Goal: Optimal Level of Functional Independence  Outcome: Ongoing (see interventions/notes)   Intervention: Optimize Functional Ability  Flowsheets (Taken 07/13/2019 2000)  Self-Care Promotion:   independence encouraged   BADL personal objects within reach   BADL personal routines maintained  Activity Management:   activity adjusted per tolerance   activity clustered for rest periods   up ad lib  Environmental Support:   calm environment promoted   rest periods encouraged     Problem: Oral Intake Inadequate COPD (Chronic Obstructive Pulmonary Disease)  Goal: Improved Nutrition Intake  Outcome: Ongoing (see interventions/notes)     Problem: Respiratory Compromise COPD (Chronic Obstructive Pulmonary Disease)  Goal: Effective Oxygenation and Ventilation  Outcome: Ongoing (see interventions/notes)  Intervention: Promote Airway Secretion Clearance  Flowsheets (Taken 07/13/2019 2000)  Cough And Deep Breathing: done independently per patient  Activity Management:   activity adjusted per tolerance   activity clustered for rest periods   up ad lib  Intervention: Optimize Oxygenation and Ventilation  Flowsheets (Taken 07/13/2019 2000)  Head of Bed (HOB) Positioning: HOB elevated     Problem: COPD Comorbidity  Goal: Maintenance of COPD Symptom Control  Outcome: Ongoing (see interventions/notes)  Intervention: Maintain COPD-Symptom Control  Flowsheets (Taken 07/13/2019 2000)  Supportive Measures:   active listening utilized   verbalization of feelings encouraged   self-responsibility promoted   self-reflection promoted   self-care encouraged   relaxation techniques promoted     Problem: Diabetes Comorbidity  Goal: Blood Glucose Level Within Targeted Range  Outcome: Ongoing (see interventions/notes)  Intervention: Monitor and Manage Glycemia  Flowsheets (Taken 07/13/2019 2000)  Glycemic Management:   blood glucose monitored   supplemental insulin given     Problem: Skin or Soft Tissue Infection  Goal: Infection Symptom Resolution  Outcome: Ongoing (see interventions/notes)  Intervention: Provide Meticulous Infection Site Care   Flowsheets (Taken 07/13/2019 2000)  Fever Reduction/Comfort Measures:  Pharmacist, hospital  Infection Prevention:   single patient room provided   promote handwashing   personal protective equipment utilized   glycemic control managed  Topical Inflammation Care: border marked  Intervention: Minimize/Manage Infection Progression  Flowsheets (Taken 07/13/2019 2000)  Infection Management: aseptic technique maintained

## 2019-07-13 NOTE — Nurses Notes (Signed)
During am assessment, pt stated she did not refuse to have her legs washed and wrapped per wound care order (wash legs/feet with hibiclens, cover dorsal feet with xeroform/ABD/Kerlix. dsgs done as ordered.

## 2019-07-13 NOTE — Nurses Notes (Signed)
07/13/19 1909   [facility] Fall Risk Assessment Tool   Fall Risk Assessment Shift Assessment   Fall Risk- Implement fall risk interventions per protocol 0   Age 64   Fall History 0   Elimination, Bowel and Urine 0   Medications (Fall risk drugs: PCA/Opiates, Anti-Convulsants, Anti-Hypertensives, Diuretics, Hypnotics, Laxatives, Sedatives and Psychotropics) 3   Patient Care Equipment 1   Mobility (Yes on any of these rows will result in High Fall Risk)   Requires assistance or supervision for mobility, transfer, or ambulation 0   Unsteady Gait 0   Visual or auditory impairment affecting mobility 0   Cognition (Yes on any of these rows will result in High Fall Risk)   Altered awareness of immediate physical environment 0   Impulsive 0   Lack of understanding of one's physical and cognitive limitations 0   [facility] Score and Interventions   [facility] Score Total 5   [facility] Identified Fall Risk Auto Low Risk (1-5 total points)   Fall Risk Interventions (All That Apply) (L,M,H) Universal Falls Precautions;(M,H) Institute/Continue Flagging System;(M,H) Monitor and assist with ADLs   Safety   Code status band matches order Yes   SBAR   Patient ID Verification (Scan Armband): \[id number]\   SBAR given: Yes   Handoff report given to: RN Missy Opoku   Dashboard Updated Yes   Dashboard Reviewed Yes

## 2019-07-13 NOTE — Progress Notes (Signed)
IP PROGRESS NOTE      [name], [name]  Date of Admission:  07/11/2019  Date of Birth:  1955/06/20  Date of Service:  07/13/2019    Chief Complaint at Presentation and Brief Summary:  Presented with lower extremity swelling and rash, admitted due to sepsis secondary to cellulitis    Subjective:  Lower extremities are sore.  Denies shortness of breath, chest pain or abdominal pain.      Assessment/ Plan:      Sepsis secondary to lower extremity cellulitis, more severe at right lower extremity however bilateral extremities are involved, bilateral blisters present  --continue following blood and wound culture results, negative to this date  --empiric antibiotics with cefepime and vancomycin  --continue wound care    Chronic hypoxic respiratory failure, on 3-4L home O2  COPD, not in acute exacerbation  --discontinue DuoNeb due to tachycardia  --continue Symbicort  --add Spiriva  --p.r.n. albuterol  --supplemental oxygen    Hypokalemia  Hypomagnesemia  --replaced potassium and magnesium    Paroxysmal atrial fibrillation  --continue anticoagulation with Eliquis  --continue home metoprolol  --telemetry monitoring     HFpEF, not in acute exacerbation  Pulmonary congestion and signs of volume overload+   --repeat Lasix 20 mg IV once today  --continue metoprolol 75 milligram p.o. q.day  --continue apixaban and statin    Uncontrolled type 2 diabetes, insulin-dependent, hyperglycemic  --change Lantus to 20 units nighttime and 30 units daytime  --blood glucose checks and insulin sliding scale    Tobacco dependency  --counseling performed      VTE prophylaxis:  Apixaban  Disposition:  Home in 2-3 days       Vital Signs:  Temp (24hrs) Max:37 C (98.6 F)      Temperature: 36.6 C (97.9 F)  BP (Non-Invasive): 133/65  MAP (Non-Invasive): 83 mmHG  Heart Rate: (!) 102 (rn notified)  Respiratory Rate: (!) 25 (rn notified)  SpO2: 94 %    Current Medications:  acetaminophen (TYLENOL) tablet, 650 mg, Oral, Q6H PRN  albuterol  (PROVENTIL) 2.5mg / 0.5 mL nebulizer solution, 2.5 mg, Nebulization, Q4H PRN  apixaban (ELIQUIS) tablet, 5 mg, Oral, Q12H  atorvastatin (LIPITOR) tablet, 40 mg, Oral, QPM  budesonide-formoterol (SYMBICORT) 160 mcg-4.5 mcg per inhalation oral inhaler - "Respiratory to administer", 2 Puff, Inhalation, 2x/day  cefepime (MAXIPIME) 2 g in NS 100 mL IVPB, 2 g, Intravenous, Q12H  SSIP insulin lispro (HUMALOG) 100 units/mL SubQ pen, 1-12 Units, Subcutaneous, 4x/day AC   And  dextrose 50% (0.5 g/mL) injection - syringe, 12.5 g, Intravenous, Q15 Min PRN  furosemide (LASIX) 10 mg/mL injection, 20 mg, Intravenous, Once  insulin glargine 100 units/mL SubQ pen, 30 Units, Subcutaneous, Daily  insulin glargine 100 units/mL SubQ pen, 20 Units, Subcutaneous, NIGHTLY  lisinopril (PRINIVIL) tablet, 5 mg, Oral, Daily  metoprolol succinate (TOPROL-XL) 24 hr extended release tablet 75 mg, 75 mg, Oral, Daily  morphine 4 mg/mL injection, 4 mg, Intravenous, Q5 Min PRN  nitroGLYCERIN (NITROSTAT) sublingual tablet, 0.4 mg, Sublingual, Q5 Min PRN  NS 250 mL flush bag, , Intravenous, Q1H PRN  NS flush syringe, 10 mL, Intravenous, Q8HRS  NS flush syringe, 10 mL, Intravenous, Q1H PRN  Pharmacy Vancomycin and/or Aminoglycoside Consult, , Intravenous, Once PRN  tiotropium bromide (SPIRIVA RESPIMAT) 2.5 mcg per inhalation oral inhaler - "Respiratory to administer", 2 Puff, Inhalation, Daily  vancomycin (VANCO READY) 1.25 g in sterile water 250 mL premix IVPB, 15 mg/kg, Intravenous, Q12H        Today's Physical  Exam:  General: appears chronically ill, not in acute distress  Eyes: Pupils equal and round, reactive to light and accomodation.   HEENT: Head atraumatic and normocephalic   Neck: No JVD or thyromegaly    Lungs:  Bilateral wheezing in all lung fields present, crackles+   Cardiovascular: S1S2+ rhythm is irregularly irregular  Abdomen: Not distended, bowel sounds normoactive, abdomen is Soft, non-tender with palpation  Extremities:   lower  extremity edema improving  Skin:  Bilateral skin rash covering from foot to knee with blisters,  improving comparing to previous days  Neurologic: Grossly normal   Psychiatric: Normal affect, behavior      ROS reviewed and negative except stated in HPI and Subjective      Labs  Please indicate ordered or reviewed)  Reviewed:   I have reviewed all lab results.  Lab Results for Last 24 Hours:    Results for orders placed or performed during the hospital encounter of 07/11/19 (from the past 24 hour(s))   POC FINGERSTICK GLUCOSE - [facility] (RESULTS)   Result Value Ref Range    GLUCOSE, POC 106 (H) 60 - 100 mg/dl   POC FINGERSTICK GLUCOSE - [facility] (RESULTS)   Result Value Ref Range    GLUCOSE, POC 146 (H) 60 - 100 mg/dl   POC FINGERSTICK GLUCOSE - [facility] (RESULTS)   Result Value Ref Range    GLUCOSE, POC 81 60 - 100 mg/dl   CBC W/AUTO DIFF   Result Value Ref Range    WBC 10.1 4.0 - 11.0 x10^3/uL    RBC 3.70 (L) 4.00 - 5.10 x10^6/uL    HGB 11.0 (L) 12.0 - 15.5 g/dL    HCT 33.4 (L) 36.0 - 45.0 %    MCV 90.3 82.0 - 97.0 fL    MCH 29.7 27.5 - 33.2 pg    MCHC 32.9 32.0 - 36.0 g/dL    RDW 14.1 11.0 - 16.0 %    PLATELETS 356 150 - 450 x10^3/uL    MPV 7.8 7.4 - 10.5 fL    NEUTROPHIL % 70 43 - 76 %    LYMPHOCYTE % 13 (L) 15 - 43 %    MONOCYTE % 14 (H) 5 - 12 %    EOSINOPHIL % 4 0 - 5 %    BASOPHIL % 1 0 - 3 %    NEUTROPHIL # 7.10 (H) 1.50 - 6.50 x10^3/uL    LYMPHOCYTE # 1.30 1.00 - 4.80 x10^3/uL    MONOCYTE # 1.40 (H) 0.20 - 0.90 x10^3/uL    EOSINOPHIL # 0.40 0.00 - 0.50 x10^3/uL    BASOPHIL # 0.00 0.00 - 0.10 E45^4/UJ   BASIC METABOLIC PANEL   Result Value Ref Range    SODIUM 140 136 - 145 mmol/L    POTASSIUM 3.7 3.5 - 5.1 mmol/L    CHLORIDE 99 96 - 111 mmol/L    CO2 TOTAL 34 (H) 23 - 31 mmol/L    ANION GAP 7 4 - 13 mmol/L    CALCIUM 8.3 (L) 8.8 - 10.2 mg/dL    GLUCOSE 150 (H) 65 - 125 mg/dL    BUN 5 (L) 8 - 25 mg/dL    CREATININE 0.53 (L) 0.60 - 1.05 mg/dL    BUN/CREA RATIO 9 6 - 22    ESTIMATED GFR >90 >=60 mL/min/BSA    VANCOMYCIN PEAK   Result Value Ref Range    VANCOMYCIN PEAK 18.8 (L) 30.0 - 40.0 ug/mL   POC FINGERSTICK GLUCOSE - [facility] (RESULTS)  Result Value Ref Range    GLUCOSE, POC 141 (H) 60 - 100 mg/dl           Radiology Tests (Please indicate ordered or reviewed)  Reviewed images from this admission:         Problem List:  [facility] Problems   (*Primary Problem)    Diagnosis   . *Sepsis due to cellulitis (CMS [place])   . COPD (chronic obstructive pulmonary disease) (CMS [facility])   . Hypokalemia   . Atrial fibrillation (CMS [facility])   . Tobacco use disorder   . Chronic hypoxemic respiratory failure (CMS [facility])     PATIENT IS GOING TO GO HOME WITH 2 LITERS OF O2 BY NASAL CANNULA PER MINUTE.            Cliffton Asters, MD    Portions of this note may be dictated using voice recognition software or a dictation service. Variances in spelling and vocabulary are possible and unintentional. Not all errors are caught/corrected. Please notify the [facility] if any discrepancies are noted or if the meaning of any statement is not clear.

## 2019-07-14 ENCOUNTER — Non-Acute Institutional Stay: Payer: Medicaid Other

## 2019-07-14 LAB — ADULT ROUTINE BLOOD CULTURE, SET OF 2 BOTTLES (BACTERIA AND YEAST)
BLOOD CULTURE, ROUTINE: NO GROWTH
BLOOD CULTURE, ROUTINE: NO GROWTH

## 2019-07-14 LAB — POC FINGERSTICK GLUCOSE - BMC/JMC (RESULTS)
GLUCOSE, POC: 137 mg/dL — ABNORMAL HIGH (ref 60–100)
GLUCOSE, POC: 173 mg/dL — ABNORMAL HIGH (ref 60–100)
GLUCOSE, POC: 203 mg/dL — ABNORMAL HIGH (ref 60–100)

## 2019-07-14 MED ORDER — LACTOBACILLUS ACIDOPHILUS 75 MILLION CELL-PECTIN 100 MG CAPSULE
1.00 | ORAL_CAPSULE | Freq: Every day | ORAL | 0 refills | Status: AC
Start: 2019-07-14 — End: 2019-07-21

## 2019-07-14 MED ORDER — CLINDAMYCIN HCL 300 MG CAPSULE
600.00 mg | ORAL_CAPSULE | Freq: Three times a day (TID) | ORAL | 0 refills | Status: AC
Start: 2019-07-14 — End: 2019-07-21

## 2019-07-14 MED ORDER — CEPHALEXIN 500 MG CAPSULE
500.00 mg | ORAL_CAPSULE | Freq: Four times a day (QID) | ORAL | 0 refills | Status: AC
Start: 2019-07-14 — End: 2019-07-21

## 2019-07-14 MED ORDER — METOPROLOL SUCCINATE ER 50 MG TABLET,EXTENDED RELEASE 24 HR
100.00 mg | ORAL_TABLET | Freq: Every day | ORAL | Status: DC
Start: 2019-07-14 — End: 2019-07-14
  Administered 2019-07-14: 100 mg via ORAL
  Filled 2019-07-14 (×2): qty 2

## 2019-07-14 NOTE — Care Management Notes (Signed)
07/14/19 1349  IP CONSULT TO CARE MANAGEMENT ([facility] - DO NOT USE FOR [facility] PATIENTS) ONE TIME CompleteDiscontinue   Process Instructions: If requesting assistance with completion of Advance Directives, please provide patient with the packet.   References: ON CALL ([place])   Provider: (Not yet assigned)   Question: Indication For Consult: Answer: D/C NEEDS - HOME CARE        Above consult received: please see CM note for today.

## 2019-07-14 NOTE — Nurses Notes (Signed)
Patient discharged home with spouse.  AVS reviewed with patient/care giver.  A written copy of the AVS and discharge instructions was given to the patient/care giver.  Questions sufficiently answered as needed.  Patient/care giver encouraged to follow up with PCP as indicated.  In the event of an emergency, patient/care giver instructed to call 911 or go to the nearest emergency room.     Pt states she did not bring an oxygen tank to the hospital and would not need O2 for the ride home but collected O2 tubing used in the hospital to be used at home. She states that her daughter already picked up prescriptions at [facility] and are ready for her at home. Pt taken by wc to front of hospital and assisted into vehicle for transportation home.

## 2019-07-14 NOTE — Progress Notes (Signed)
IP PROGRESS NOTE      Melinda, [name]  Date of Admission:  07/11/2019  Date of Birth:  January 04, 1956  Date of Service:  07/14/2019    Chief Complaint at Presentation and Brief Summary:  Presented with lower extremity swelling and rash, admitted due to sepsis secondary to cellulitis    Subjective:  Feeling better.  Lower extremity pain is improved.  Denies shortness of breath or chest pain or abdominal pain      Assessment/ Plan:      Sepsis secondary to lower extremity cellulitis, more severe at right lower extremity however bilateral extremities are involved, bilateral blisters present  --continue following blood   culture results, negative to this date  --wound culture negative   --empiric antibiotics with cefepime and vancomycin for another day, plan to switch to po antibiotics tomorrow   --continue wound care    Chronic hypoxic respiratory failure, on 3-4L home O2  COPD, not in acute exacerbation  --discontinue DuoNeb due to tachycardia  --continue Symbicort  --add Spiriva  --p.r.n. albuterol  --supplemental oxygen    Hypokalemia  Hypomagnesemia  --replaced potassium and magnesium    Paroxysmal atrial fibrillation  --continue anticoagulation with Eliquis  --increase Toprol to 100 mg p.o. q.day  --telemetry monitoring     HFpEF, not in acute exacerbation  Pulmonary congestion and signs of volume overload+   --restart home Lasix po  --increase Toprol to 100 mg p.o. q.day  --continue apixaban and statin    Uncontrolled type 2 diabetes, insulin-dependent, hyperglycemic  --change Lantus to 20 units nighttime and 30 units daytime  --blood glucose checks and insulin sliding scale    Tobacco dependency  --counseling performed      VTE prophylaxis:  Apixaban  Disposition:  Likely home tomorrow      Vital Signs:  Temp (24hrs) Max:36.7 C (98.1 F)      Temperature: 36.6 C (97.9 F)  BP (Non-Invasive): (!) 134/56  MAP (Non-Invasive): 74 mmHG  Heart Rate: (!) 102  Respiratory Rate: 20  SpO2: 94 %    Current Medications:   acetaminophen (TYLENOL) tablet, 650 mg, Oral, Q6H PRN  albuterol (PROVENTIL) 2.5mg / 0.5 mL nebulizer solution, 2.5 mg, Nebulization, Q4H PRN  apixaban (ELIQUIS) tablet, 5 mg, Oral, Q12H  atorvastatin (LIPITOR) tablet, 40 mg, Oral, QPM  budesonide-formoterol (SYMBICORT) 160 mcg-4.5 mcg per inhalation oral inhaler - "Respiratory to administer", 2 Puff, Inhalation, 2x/day  cefepime (MAXIPIME) 2 g in NS 100 mL IVPB, 2 g, Intravenous, Q12H  SSIP insulin lispro (HUMALOG) 100 units/mL SubQ pen, 1-12 Units, Subcutaneous, 4x/day AC   And  dextrose 50% (0.5 g/mL) injection - syringe, 12.5 g, Intravenous, Q15 Min PRN  insulin glargine 100 units/mL SubQ pen, 30 Units, Subcutaneous, Daily  insulin glargine 100 units/mL SubQ pen, 20 Units, Subcutaneous, NIGHTLY  lisinopril (PRINIVIL) tablet, 5 mg, Oral, Daily  metoprolol succinate (TOPROL-XL) 24 hr extended release tablet, 100 mg, Oral, Daily  morphine 4 mg/mL injection, 4 mg, Intravenous, Q5 Min PRN  nitroGLYCERIN (NITROSTAT) sublingual tablet, 0.4 mg, Sublingual, Q5 Min PRN  NS 250 mL flush bag, , Intravenous, Q1H PRN  NS flush syringe, 10 mL, Intravenous, Q8HRS  NS flush syringe, 10 mL, Intravenous, Q1H PRN  Pharmacy Vancomycin and/or Aminoglycoside Consult, , Intravenous, Once PRN  tiotropium bromide (SPIRIVA RESPIMAT) 2.5 mcg per inhalation oral inhaler - "Respiratory to administer", 2 Puff, Inhalation, Daily  vancomycin (VANCO READY) 1.25 g in sterile water 250 mL premix IVPB, 15 mg/kg, Intravenous, Q12H  Today's Physical Exam:  General: appears chronically ill, not in acute distress  Eyes: Pupils equal and round, reactive to light and accomodation.   HEENT: Head atraumatic and normocephalic   Neck: No JVD or thyromegaly    Lungs:   improved breath sounds, no crackles, no wheeze  Cardiovascular: S1S2+ rhythm is irregularly irregular  Abdomen: Not distended, bowel sounds normoactive, abdomen is Soft, non-tender with palpation  Extremities:   lower extremity edema  improving  Skin:   bilateral lower extremity rash is  improving, less angry appearing   Neurologic: Grossly normal   Psychiatric: Normal affect, behavior      ROS reviewed and negative except stated in HPI and Subjective      Labs  Please indicate ordered or reviewed)  Reviewed:   I have reviewed all lab results.  Lab Results for Last 24 Hours:    Results for orders placed or performed during the hospital encounter of 07/11/19 (from the past 24 hour(s))   POC FINGERSTICK GLUCOSE - [facility] (RESULTS)   Result Value Ref Range    GLUCOSE, POC 141 (H) 60 - 100 mg/dl   VANCOMYCIN, TROUGH   Result Value Ref Range    VANCOMYCIN TROUGH 9.4 (L) 10.0 - 20.0 ug/mL   POC FINGERSTICK GLUCOSE - [facility] (RESULTS)   Result Value Ref Range    GLUCOSE, POC 182 (H) 60 - 100 mg/dl   POC FINGERSTICK GLUCOSE - [facility] (RESULTS)   Result Value Ref Range    GLUCOSE, POC 176 (H) 60 - 100 mg/dl   POC FINGERSTICK GLUCOSE - [facility] (RESULTS)   Result Value Ref Range    GLUCOSE, POC 137 (H) 60 - 100 mg/dl           Radiology Tests (Please indicate ordered or reviewed)  Reviewed images from this admission:         Problem List:  Active Hospital Problems   (*Primary Problem)    Diagnosis   . *Sepsis due to cellulitis (CMS [place])   . COPD (chronic obstructive pulmonary disease) (CMS [facility])   . Hypokalemia   . Atrial fibrillation (CMS [facility])   . Tobacco use disorder   . Chronic hypoxemic respiratory failure (CMS [facility])     PATIENT IS GOING TO GO HOME WITH 2 LITERS OF O2 BY NASAL CANNULA PER MINUTE.            Cliffton Asters, MD    Portions of this note may be dictated using voice recognition software or a dictation service. Variances in spelling and vocabulary are possible and unintentional. Not all errors are caught/corrected. Please notify the [facility] if any discrepancies are noted or if the meaning of any statement is not clear.

## 2019-07-14 NOTE — Care Management Notes (Signed)
Referral has been sent to [facility] via [employer].

## 2019-07-14 NOTE — Nurses Notes (Signed)
Received bedside change of RN shift report, pt awake and participated, denies needs at this time. Assumed care of pt.she is a low fall risk, with bed in low position and call light, phone and personal items in reach for her comfort and safety. Assumed care of pt.

## 2019-07-14 NOTE — Nurses Notes (Signed)
07/14/19 0729   [facility] Fall Risk Assessment Tool   Fall Risk Assessment Shift Assessment   Fall Risk- Implement fall risk interventions per protocol 0   Age 64   Fall History 0   Elimination, Bowel and Urine 0   Medications (Fall risk drugs: PCA/Opiates, Anti-Convulsants, Anti-Hypertensives, Diuretics, Hypnotics, Laxatives, Sedatives and Psychotropics) 3   Patient Care Equipment 1   Mobility (Yes on any of these rows will result in High Fall Risk)   Requires assistance or supervision for mobility, transfer, or ambulation 0   Unsteady Gait 0   Visual or auditory impairment affecting mobility 0   Cognition (Yes on any of these rows will result in High Fall Risk)   Altered awareness of immediate physical environment 0   Impulsive 0   Lack of understanding of one's physical and cognitive limitations 0   [facility] Score and Interventions   [facility] Score Total 5   [facility] Identified Fall Risk Auto Low Risk (1-5 total points)   Fall Risk Interventions (All That Apply) (L,M,H) [place] Precautions   Safety   Safety Factors bed in low position;wheels locked;call light in reach;upper side rails raised x 2;ID band on;fall risk slippers on;observed patient   Code status band matches order Yes   SBAR   Patient ID Verification (Scan Armband): \[id number]\   SBAR given: Yes   Handoff report given to: [facility] RN   Dashboard Updated Yes   Dashboard Reviewed Yes

## 2019-07-14 NOTE — Care Plan (Signed)
Problem: Adult Inpatient Plan of Care  Goal: Plan of Care Review  Outcome: Adequate for Discharge  Goal: Patient-Specific Goal (Individualized)  Outcome: Adequate for Discharge  Goal: Absence of Hospital-Acquired Illness or Injury  Outcome: Adequate for Discharge  Goal: Optimal Comfort and Wellbeing  Outcome: Adequate for Discharge  Goal: Rounds/Family Conference  Outcome: Adequate for Discharge     Problem: Functional Ability Impaired COPD (Chronic Obstructive Pulmonary Disease)  Goal: Optimal Level of Functional Independence  Outcome: Adequate for Discharge     Problem: Oral Intake Inadequate COPD (Chronic Obstructive Pulmonary Disease)  Goal: Improved Nutrition Intake  Outcome: Adequate for Discharge     Problem: Respiratory Compromise COPD (Chronic Obstructive Pulmonary Disease)  Goal: Effective Oxygenation and Ventilation  Outcome: Adequate for Discharge     Problem: COPD Comorbidity  Goal: Maintenance of COPD Symptom Control  Outcome: Adequate for Discharge     Problem: Diabetes Comorbidity  Goal: Blood Glucose Level Within Targeted Range  Outcome: Adequate for Discharge     Problem: Skin or Soft Tissue Infection  Goal: Infection Symptom Resolution  Outcome: Adequate for Discharge

## 2019-07-14 NOTE — Discharge Summary (Signed)
[facility]  [place], [place] [address]    DISCHARGE SUMMARY      PATIENT NAME:  [name], [name]  MRN:  [id number]  DOB:  [birth year]    ADMISSION DATE:  07/11/2019  DISCHARGE DATE:  07/14/2019    ATTENDING PHYSICIAN: Cliffton Asters, MD  PRIMARY CARE PHYSICIAN: Hubbard Robinson, DO     ADMISSION DIAGNOSIS: Sepsis due to cellulitis (CMS [place])  DISCHARGE DIAGNOSIS:   Active Hospital Problems    Diagnosis Date Noted   . Principle Problem: Sepsis due to cellulitis (CMS [facility]) [L03.90, A41.9] 07/11/2019   . COPD (chronic obstructive pulmonary disease) (CMS [facility]) [J44.9] 07/12/2019   . Hypokalemia [E87.6] 07/12/2019   . Atrial fibrillation (CMS [facility]) [I48.91] 07/12/2019   . Tobacco use disorder [F17.200] 05/22/2017   . Chronic hypoxemic respiratory failure (CMS [facility]) [J96.11] 10/11/2012      Resolved Hospital Problems   No resolved problems to display.     Active Non-Hospital Problems    Diagnosis Date Noted   . COPD exacerbation (CMS [place]) 06/26/2019   . Atrial fibrillation with rapid ventricular response (CMS [facility]) 06/18/2019   . Acute hypoxemic respiratory failure (CMS HCC) 12/04/2018   . Sepsis 10/27/2018   . CAP (community acquired pneumonia) 10/26/2018   . Acute exacerbation of chronic obstructive pulmonary disease (COPD) (CMS HCC) 03/27/2018   . COPD with acute exacerbation (CMS HCC) 05/23/2017   . Acute respiratory failure (CMS [facility]) 05/21/2017   . Non-compliance 03/27/2017   . 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 Diabetes (CMS [place]) 11/18/2015   . Obesity (BMI 35.0-39.9 without comorbidity) 10/08/2012      DISCHARGE MEDICATIONS:     Current Discharge Medication List      START taking these medications.      Details   cephalexin 500 mg Capsule  Commonly known as: KEFLEX   500 mg, Oral, 4 TIMES DAILY  Qty: 28 Capsule  Refills: 0     clindamycin 300 mg Capsule  Commonly known as: CLEOCIN   600 mg, Oral, 3 TIMES DAILY  Qty: 42 Capsule  Refills: 0     lactobacillus acidoph-pectin 75 million cell  -100 mg Capsule  Commonly known as: ACIDOPHILUS-PECTIN   1 Capsule, Oral, DAILY  Qty: 7 Capsule  Refills: 0        CONTINUE these medications which have CHANGED during your visit.      Details   Advair Diskus 500-50 mcg/dose Disk with Device oral diskus inhaler  Generic drug: fluticasone propion-salmeteroL  What changed:    how much to take   how to take this   when to take this   INHALE 1 DOSE BY MOUTH TWICE DAILY  Qty: 60 Each  Refills: 5     * albuterol 2.5 mg/0.5 mL Solution for Nebulization  Commonly known as: PROVENTIL  What changed: See the new instructions.   USE 1 VIAL IN NEBULIZER EVERY 4 HOURS  Qty: 30 Each  Refills: 2     * albuterol sulfate 2.5 mg /3 mL (0.083 %) Solution for Nebulization  Commonly known as: PROVENTIL  What changed: See the new instructions.   USE 1 VIAL IN NEBULIZER 4 TIMES DAILY  Qty: 150 mL  Refills: 3     atorvastatin 40 mg Tablet  Commonly known as: LIPITOR  What changed: See the new instructions.   Take 1 tablet by mouth once daily  Qty: 30 Tab  Refills: 5     lisinopriL  5 mg Tablet  Commonly known as: PRINIVIL  What changed:    how much to take   how to take this   when to take this   Take 1 tablet by mouth once daily  Qty: 30 Tab  Refills: 3         * This list has 2 medication(s) that are the same as other medications prescribed for you. Read the directions carefully, and ask your doctor or other care provider to review them with you.            CONTINUE these medications - NO CHANGES were made during your visit.      Details   apixaban 5 mg Tablet  Commonly known as: ELIQUIS   5 mg, Oral, EVERY 12 HOURS  Qty: 60 Tablet  Refills: 0     * Atrovent HFA 17 mcg/actuation HFA Aerosol Inhaler oral inhaler  Generic drug: ipratropium bromide   INHALE 2 PUFFS BY MOUTH 4 TIMES DAILY  Qty: 13 Each  Refills: 5     * ipratropium 0.02 % Solution  Commonly known as: ATROVENT   0.5 mg, Nebulization, EVERY 4 HOURS  Qty: 30 Each  Refills: 2     furosemide 20 mg Tablet  Commonly known as:  LASIX   20 mg, Oral, DAILY  Qty: 30 Tablet  Refills: 1     Lantus Solostar U-100 Insulin 100 unit/mL Insulin Pen  Generic drug: insulin glargine   30 Units, Subcutaneous, 2 TIMES DAILY  Qty: 30 mL  Refills: 1     metoprolol succinate 50 mg Tablet Sustained Release 24 hr  Commonly known as: TOPROL-XL   50 mg, Oral, DAILY  Qty: 30 Tablet  Refills: 0         * This list has 2 medication(s) that are the same as other medications prescribed for you. Read the directions carefully, and ask your doctor or other care provider to review them with you.              DISCHARGE INSTRUCTIONS:      Refer to [facility]   Referral Type: Home Health   Requested Specialty: [place]   Number of Visits Requested: [place] DIET     Diet: DIABETIC DIET      DISCHARGE INSTRUCTION - ACTIVITY     Activity: GRADUALLY INCREASE ACTIVITY AS TOLERATED      DISCHARGE INSTRUCTION - MISC    Take antibiotics called clindamycin 638m three times daily and cephalexin 5056mfour times daily for 7 days more along with a probiotic to complete your antibiotic therapy for cellulitis.     Otherwise We did not change any of your home medications, please continue taking them as you were taking before coming to the hospital as instructed by your primary care physician.     Follow up with wound care clinic as outpatient.     ASPIRIN NOT ORDERED AT THIS TIME     FOLLOW-UP: [place]   Follow-up in: FIRST AVAILABLE    Follow-up reason: lower extremity wounds      [place]Pine[place] MountainOURSE:  This is a [age].o., female  with past medical history of chronic hypoxic respiratory failure, type 2 diabetes, hypertension presented to the hospital with bilateral lower extremity swelling and  a rash.  Patient met the sepsis criteria in the emergency department, cultures drawn, fluid bolus given,  started on broad-spectrum antibiotics.  Likely source of sepsis was lower extremity cellulitis.  After initiation of IV antibiotics patient had significant improvement of her cellulitis.  At the time of admission she was also having shortness of breath and congestion.  Chest x-ray was showing pulmonary edema.  Received 2 doses of IV Lasix in the hospital with resolution of shortness of breath.  Seen by Wound Care and got the instructions for wound care.  Due to clinical improvement, switched antibiotics to p.o. clindamycin and Keflex.  Discharged home with 7 days more of antibiotic therapy.  Patient is supposed to follow-up with wound care clinic as outpatient.  Home health services arranged for wound care as patient might have difficulty to perform wound care on her own     Discharge time including planning and counselling spent for this patient is 34 minutes     Physical exam :  General: appearschronically ill, not in acute distress  Eyes: Pupils equal and round, reactive to light and accomodation.   HEENT: Head atraumatic and normocephalic  Neck: No JVD or thyromegaly   Lungs: improved breath sounds, no crackles, no wheeze  Cardiovascular: S1S2+rhythm is irregularly irregular  Abdomen: Not distended, bowel sounds normoactive, abdomen is Soft, non-tender with palpation  Extremities: lower extremity edema improving  Skin: bilateral lower extremity rash is  improving, less angry appearing   Neurologic: Grossly normal  Psychiatric: Normal affect, behavior      COURSE IN HOSPITAL: As above.  Please see My  H&P for admission details.    CONDITION ON DISCHARGE: Alert and Oriented    DISCHARGE DISPOSITION:  Home discharge     cc: Primary Care Physician:  Laurence Aly, DO  [address]  [place] [address]     BV:UCPOBLMMO Physician:  No referring provider defined for this encounter.

## 2019-07-14 NOTE — Nurses Notes (Signed)
End of shift note:    Plan of care and medication reviewed with pt who verbalized understanding. A&PX4. On O2 NC with no distress noted. Up ad lib. Blood sugars monitored and supplemental insulin given per sliding scale. Denies any pain this shift. dsg to BLE intact with with old drainage.  IV abx given as schedule. Appears to be sleeping and awakes in between care. Call bell and other personal items placed within reach.

## 2019-07-14 NOTE — Care Management Notes (Signed)
Met with the patient to discuss home health care for skilled nursing. Patient does have [employer] Medicaid so there are limited [place] care services that will accept her insurance. Patient is agreeable to [facility]. HH form signed by the patient and place on her chart to be scanned by HIM. Patient and her husband who was present in the room are aware that home health will call the patient at home regarding start of care and it may be the middle of next week before start of care will begin. Both the patient and her husband verbalizes understanding of same. HH order has been pended. Secure chat to Dr Suzanna Obey regarding the home health order for his review and signature. Once completed will send the referral to [facility].

## 2019-07-14 NOTE — Nurses Notes (Signed)
Dr Suzanna Obey gave wound orders as recommended by wound care nurse on 7/2. Phone call to wound center and left VM so that they will contact her after discharge to arrange wound center visits. Spoke with CM L Steg, who saw pt to arrange home health.

## 2019-07-14 NOTE — Pharmacy (Signed)
[place] / [facility]  Therapeutic Drug Monitoring: Vancomycin  07/14/2019      Patient name: [name], [name]  Date of Birth:  [birth year]    Actual Weight:  Weight: 87.1 kg (192 lb) (07/11/19 1334)     BMI:  BMI (Calculated): 34.08 (07/11/19 1334)    Date RPh Current regimen (including mg/kg) Indication AUC or trough based dosing Target Levels^ SCr (mg/dL) CrCl* (mL/min) Measured level(s)   (mcg/mL) Calculated AUC (if AUC based monitoring) Plan (including when levels are due) Comments   7/1 jpf 1250 mg x1 Sepsis d/t BLE cellulitis  AUC 400-600 0.61 100.1  469 1250 mg q12h  Levels 7/3    Vanc 1250 mg q12h    7/2 CC Vanc 1250mg  iv q12h    0.48 127.2   Vanc peak 7/3 @ 0900 and trough @ 16:00 Monitor renal function   7/3 GS Vancomycin 1250 mg IV Q12H SSTI AUC 400-600   Pk = 18.8  No changes In 480  Voiding per flowsheets   7/3 TM 1250mg  q12h  AUC 400-600 0.53 115.2 T-9.4 337 Increase to 1500mg  IV q12h to start 7/4 since nurse already hung 1250mg  dose Predicted AUC 405  Peak ordered for 7/5 PM dose  Trough ordered for 7/6 AM dose   7/4 GS Vancomycin 1500 mg IV Q12H SSTI AUC 400-600     No changes  Pk = 7/5 @ 2000  Tr = 7/6 @ 0500 In 1142  Voiding per flowsheets                                                                           Barrie Folk Target depends on dosing and monitoring method, AUC vs. trough based. For AUC based dosing units are mg*h/L. For trough based dosing units are mcg/mL.     *Creatinine clearance is estimated by using the Cockcroft-Gault equation for adult patients and the Carol Ada for pediatric patients.    The decision to discontinue vancomycin therapy will be determined by the primary service.  Please contact the pharmacist with any questions regarding this patient's medication regimen.

## 2019-07-14 NOTE — Discharge Instructions (Signed)
[facility] will call you once they have placed you on their schedule.     Contact number for [facility]: [phone]

## 2019-07-16 ENCOUNTER — Other Ambulatory Visit: Payer: Self-pay

## 2019-07-16 ENCOUNTER — Non-Acute Institutional Stay: Payer: Medicaid Other | Attending: Family Medicine

## 2019-07-16 ENCOUNTER — Telehealth (HOSPITAL_BASED_OUTPATIENT_CLINIC_OR_DEPARTMENT_OTHER): Payer: Self-pay

## 2019-07-16 NOTE — Telephone Encounter (Signed)
Transition of Care Contact  Discharge Date: 07/14/2019  Transition Facility Type--Hospital (Inpatient or Observation)  [facility]  Interactive Contact(s): Completed or attempted contact indicated by Date/Time  Completed Contact--07/16/2019  1:17 PM  First Attempt  Second Attempt  Third Attempt  Contact Method(s)-- Patient/Caregiver Telephone  Clinical Staff Name/Role who contacted--Faustina Gebert Carlton Adam RN   Transition Assessment  Discharge Summary obtained?--Yes  How are you recovering?--Improving  Discharge Meds obtained?--Yes  Medications reconcilled with Discharge Documentation?  Medication understanding --knows new medication(s)--knows purpose of medication  Medication Concerns?--No  Have everything needed for recovery?--Yes  Care Coordination:   Patient has transition follow-up appointment date and time?--Yes  Primary Care Transition Visit planned?--Yes  Specialist Transition Visit planned?--Yes  Patient/caregiver plans to attend transition visit?--Yes  Primary Follow-up Barrier  Interventions provided --follow-up appointment date/time reinforced--patient expresses understanding of follow-up plan  Home Health or DME ordered at discharge?--Yes  Agency has contacted patient to initiate services?  Home Health or DME Agency:--Home Health  Clinician/Team notified?  Primary reason clinician notified?  Transition Note:   TCM outreach completed. She is home and feeling better. She states that Home Health was there today to change the dressing on her wounds. She was able to obtain her discharge medications. She has a follow-up scheduled with the Wound Care on 7/14 and with Dr. Sabra Heck on 7/19. She plans to keep those appts. All questions answered. Shelly Rubenstein, RN  07/16/2019, 13:17       Further information may be documented in relevant telephone or outreach encounter.

## 2019-07-16 NOTE — Care Management Notes (Signed)
Referral Information  ++++++ Placed Provider #1 ++++++  Case Manager: Karen Roach  Provider Type: Home Health  Provider Name: [facility] - [place] - [place], [place]  Address:  [address]  [place], [place] [address]  Contact: peggy carr    Phone: [phone] x  Fax:   Fax: [phone]

## 2019-07-17 ENCOUNTER — Other Ambulatory Visit: Payer: Self-pay

## 2019-07-17 MED ORDER — ALBUTEROL SULFATE HFA 90 MCG/ACTUATION AEROSOL INHALER
2.00 | INHALATION_SPRAY | Freq: Four times a day (QID) | RESPIRATORY_TRACT | Status: DC
Start: 2018-01-10 — End: 2019-12-04

## 2019-07-17 MED ORDER — OXYGEN
3.00 L/min | GAS_FOR_INHALATION | RESPIRATORY_TRACT | Status: AC
Start: 2018-01-10 — End: ?

## 2019-07-17 NOTE — Home Health (Signed)
SN [facility]  Tuesday, July 16, 2019  Pt refuses COVID vaccine, was COVID screened PTA via phone number that pt provided (original phone out of service) Didn't get a chance to update Travel Screening. No one in home vaccinated. Two grandchildren in home are too young to meet criteria. Last neg COVID screening 06/18/2019.      Hx: [facility] 7/1 - 7/4 Dx sepsis d/t cellulitis bilateral lower legs (also had sepsis in 2020), chronic hypoxic respiratory failure with oxygen use but continues to smoke with unsafe awareness to patient and family, non-compliance, DM w/insulin, other, Eliquis for afib hx.    Signed papers, uploaded. Supply list and instructions uploaded. 24/7 [place] availabity stressed w/ph. Binder in home, admission papers placed in binder. Pt uses oxygen via concentrator 3-4L nasal cannula with tubing reaching around the apartment. No signage. Pt actively still smoking and refuses to acknowledge safety and damage to health and risks to prognosis. Portable oxygen tanks are in rack near front door area.  Due to extreme shortness of breath, requires assistance of husband with all ADLs. Used to drive, but doesn't now. Was able to walk across [address] to [employer], but can no longer due to shortness of breath.    Six people, including two elementary school age children, living in the home. Daughter and boyfriend w/these two sons, Jonathon Resides., 6 & 56, have moved in until "can find jobs and their own place." Medications within reach. Children's Safety issue, one child w/speech deficit age 54.    Ground level Apartment is untidy, walls,chairs, and surfaces are soiled/grimy, and floor is very soiled/sticky, notably from drainage from patient's weeping legs from prior to hospitalization under her kitchen chair area.    Pt not wearing footware due to pain, edema. Bandages saturated with drainage. Cellulitis has receeded from hospital placed ink. Yellow slough.  Bottom of Feet have no open areas, bottom of feet dirty from  floor.See Wound comments/measurements. Small paper ruler left in home.     Pt agreed to discuss Advanced Directives, etc with MSW. Order placed. Very briefly touched on prognosis for chronic respiratory failure, peripheral circulation, and smoking & DM hx and hx sepsis x 2.  HgA1C:   2016 10.7;  2017 11.0;  2019 8.0;  2020 12.0;  Does not keep glucose log, weight log, vitals log. Glucometer currently in neice's apartment.    7/14   8a  [facility] Pt Eval. Dr. Georgann Housekeeper.  7/19: 11a Dr. Hubbard Robinson

## 2019-07-18 ENCOUNTER — Other Ambulatory Visit: Payer: Medicaid Other

## 2019-07-19 ENCOUNTER — Other Ambulatory Visit: Payer: Medicaid Other

## 2019-07-21 ENCOUNTER — Other Ambulatory Visit: Payer: Self-pay

## 2019-07-21 NOTE — Home Health (Addendum)
Pt sitting at table upon RN arrival.  Pt states she is doing pretty good.  Pt states pain at 4/10 in bilateral lower extremities.  Assessment completed, VSS.  Wound care completed to BLE with CG observing.  Pt was anxious about her dressing change as it is very painful, but pt tolerated well.  Pt refuses to check her BSs stating she can tell when she is high and low.  Explained the importance of monitoring BS and BS/diet vs. wounds/healing.  Medictions reviewed, no questions or concerns.  Insurance coverage reviewed; Pt has Medicaid with 0 financial responsibility; however, she was only approved for 5 SN visits.  This is her second visit.  Spoke with pt regarding her VF.  Pt and RN agreed to 1 visit a week with the understanding she has PRN visits available if needed.  Pt's CG is comfortable and capable of doing wound care in absence of [facility] staff.  Spoke with pt and her caregiver about fire risks and the use of oxygen, as admission nurse had concerns.  Reinforced no smoking or open flames near O2.  Pt states everyone has been going outside.  Pt's husband currently outside smoking during my visit.  Pt denies further needs at this time.  Pt has agency numbers if needed.  Next SN visit planned for next Friday.

## 2019-07-22 ENCOUNTER — Other Ambulatory Visit: Payer: Self-pay

## 2019-07-23 ENCOUNTER — Other Ambulatory Visit: Payer: Self-pay

## 2019-07-24 ENCOUNTER — Other Ambulatory Visit: Payer: Self-pay

## 2019-07-24 ENCOUNTER — Ambulatory Visit (HOSPITAL_BASED_OUTPATIENT_CLINIC_OR_DEPARTMENT_OTHER): Payer: Medicaid Other | Admitting: Surgery

## 2019-07-24 ENCOUNTER — Ambulatory Visit
Admission: RE | Admit: 2019-07-24 | Discharge: 2019-07-24 | Disposition: A | Discharge status: Home / Self Care | Payer: Medicaid Other | Source: Ambulatory Visit | Attending: Surgery | Admitting: Surgery

## 2019-07-24 ENCOUNTER — Other Ambulatory Visit (HOSPITAL_COMMUNITY): Payer: Self-pay | Admitting: Surgery

## 2019-07-24 ENCOUNTER — Encounter (HOSPITAL_COMMUNITY): Payer: Self-pay | Admitting: Surgery

## 2019-07-24 VITALS — BP 136/72 | HR 109 | Temp 97.5°F | Resp 23 | Ht 63.0 in | Wt 195.0 lb

## 2019-07-24 DIAGNOSIS — I872 Venous insufficiency (chronic) (peripheral): Secondary | ICD-10-CM

## 2019-07-24 DIAGNOSIS — Z6834 Body mass index (BMI) 34.0-34.9, adult: Secondary | ICD-10-CM

## 2019-07-24 DIAGNOSIS — I83029 Varicose veins of left lower extremity with ulcer of unspecified site: Secondary | ICD-10-CM | POA: Insufficient documentation

## 2019-07-24 DIAGNOSIS — I83015 Varicose veins of right lower extremity with ulcer other part of foot: Secondary | ICD-10-CM | POA: Insufficient documentation

## 2019-07-24 DIAGNOSIS — L97529 Non-pressure chronic ulcer of other part of left foot with unspecified severity: Secondary | ICD-10-CM | POA: Insufficient documentation

## 2019-07-24 DIAGNOSIS — L97929 Non-pressure chronic ulcer of unspecified part of left lower leg with unspecified severity: Secondary | ICD-10-CM | POA: Insufficient documentation

## 2019-07-24 DIAGNOSIS — R600 Localized edema: Secondary | ICD-10-CM

## 2019-07-24 DIAGNOSIS — S80822A Blister (nonthermal), left lower leg, initial encounter: Secondary | ICD-10-CM | POA: Insufficient documentation

## 2019-07-24 DIAGNOSIS — L97519 Non-pressure chronic ulcer of other part of right foot with unspecified severity: Secondary | ICD-10-CM | POA: Insufficient documentation

## 2019-07-24 DIAGNOSIS — S80821A Blister (nonthermal), right lower leg, initial encounter: Secondary | ICD-10-CM | POA: Insufficient documentation

## 2019-07-24 DIAGNOSIS — E1169 Type 2 diabetes mellitus with other specified complication: Secondary | ICD-10-CM

## 2019-07-24 DIAGNOSIS — F17210 Nicotine dependence, cigarettes, uncomplicated: Secondary | ICD-10-CM | POA: Insufficient documentation

## 2019-07-24 DIAGNOSIS — I83025 Varicose veins of left lower extremity with ulcer other part of foot: Secondary | ICD-10-CM | POA: Insufficient documentation

## 2019-07-24 LAB — POC FINGERSTICK GLUCOSE - BMC/JMC (RESULTS): GLUCOSE, POC: 359 mg/dL — ABNORMAL HIGH (ref 60–100)

## 2019-07-24 NOTE — Addendum Note (Signed)
Addended by: Laurence Aly on: 07/24/2019 12:38 PM     Modules accepted: Orders, SmartSet

## 2019-07-24 NOTE — H&P (Signed)
[facility]  [place], [place] [address]    Wound Care History and Physical      Date of Service: 07/24/2019  Wynn Maudlin, 64 y.o., White female  Date of Birth:  29-Feb-1955  MRN: [id number]  PCP: Hubbard Robinson, DO  Information Obtained from: Patient.    Reason for visit:   Chief Complaint   Patient presents with   . Wound       HPI:  [name] is a 64 y.o. year old female who is referred for evaluation of bilateral lower extremities edema blistering erythema tenderness Large bilateral pretibila blisters faint DP on the left Non palpable DP on the right Poorly compliant smoking and being on oxygen and having high BSS and being diabetic  Also moderate amount of exudate    Social History     Tobacco Use   Smoking Status Current Every Day Smoker   . Packs/day: 1.00   . Years: 20.00   . Pack years: 20.00   . Types: Cigarettes   Smokeless Tobacco Never Used       Review of Systems  Review of Systems   Constitutional: Positive for activity change and fatigue. Negative for appetite change, chills, diaphoresis and fever.   HENT: Negative for congestion, dental problem, drooling, facial swelling, rhinorrhea, sore throat and trouble swallowing.    Eyes: Negative for pain, discharge and itching.   Respiratory: Positive for shortness of breath. Negative for apnea, cough, choking, chest tightness, wheezing and stridor.    Cardiovascular: Positive for leg swelling. Negative for chest pain and palpitations.   Gastrointestinal: Negative for abdominal distention, abdominal pain, anal bleeding, blood in stool, constipation, diarrhea, nausea, rectal pain and vomiting.   Endocrine: Negative for cold intolerance, heat intolerance and polydipsia.   Genitourinary: Negative for difficulty urinating, dysuria, enuresis, flank pain, frequency, hematuria and pelvic pain.   Musculoskeletal: Negative for arthralgias, back pain, joint swelling, myalgias, neck pain and neck  stiffness.   Skin: Positive for color change, pallor and wound. Negative for rash.   Allergic/Immunologic: Negative for immunocompromised state.   Neurological: Positive for tremors and weakness. Negative for dizziness, seizures, syncope, light-headedness, numbness and headaches.   Hematological: Negative for adenopathy. Does not bruise/bleed easily.   Psychiatric/Behavioral: Negative for agitation, confusion, hallucinations and suicidal ideas. The patient is not nervous/anxious.      Problem List:  Patient Active Problem List    Diagnosis   . COPD (chronic obstructive pulmonary disease) (CMS HCC)   . Hypokalemia   . Atrial fibrillation (CMS HCC)   . Sepsis due to cellulitis (CMS [place])   . COPD exacerbation (CMS HCC)   . Atrial fibrillation with rapid ventricular response (CMS HCC)   . Acute hypoxemic respiratory failure (CMS HCC)   . Sepsis   . CAP (community acquired pneumonia)   . Acute exacerbation of chronic obstructive pulmonary disease (COPD) (CMS HCC)   . COPD with acute exacerbation (CMS HCC)   . Tobacco use disorder   . Acute respiratory failure (CMS HCC)   . Non-compliance   . Hypertension due to endocrine disorder   . Diabetes (CMS [place])   . Chronic hypoxemic respiratory failure (CMS HCC)   . Obesity (BMI 35.0-39.9 without comorbidity)       History and vital signs as completed by clinical staff and reviewed by Flora Lipps, MD  Past Medical History  Current Outpatient Medications   Medication Sig   .  ADVAIR DISKUS 500-50 mcg/dose Inhalation Disk with Device oral diskus inhaler INHALE 1 DOSE BY MOUTH TWICE DAILY (Patient taking differently: Take 1 INHALATION by inhalation Twice daily INHALE 1 DOSE BY MOUTH TWICE DAILY)   . albuterol (PROVENTIL) 2.5 mg/0.5 mL Inhalation Solution for Nebulization USE 1 VIAL IN NEBULIZER EVERY 4 HOURS (Patient taking differently: 2.5 mg by Nebulization route Every 4 hours as needed )   . albuterol sulfate (PROVENTIL HFA) 90 mcg/actuation Inhalation HFA Aerosol Inhaler Take 2  Puffs by inhalation Four times a day. Indications: chronic obstructive pulmonary disease   . albuterol sulfate (PROVENTIL) 2.5 mg /3 mL (0.083 %) Inhalation Solution for Nebulization USE 1 VIAL IN NEBULIZER 4 TIMES DAILY   . apixaban (ELIQUIS) 5 mg Oral Tablet Take 5 mg by mouth Twice daily   . atorvastatin (LIPITOR) 40 mg Oral Tablet Take 1 tablet by mouth once daily (Patient taking differently: Take 40 mg by mouth Every evening )   . ATROVENT HFA 17 mcg/actuation Inhalation HFA Aerosol Inhaler oral inhaler INHALE 2 PUFFS BY MOUTH 4 TIMES DAILY   . furosemide (LASIX) 20 mg Oral Tablet Take 1 Tablet (20 mg total) by mouth Once a day for 30 days   . insulin glargine (LANTUS SOLOSTAR U-100 INSULIN) 100 unit/mL Subcutaneous Insulin Pen 30 Units by Subcutaneous route Twice daily   . ipratropium (ATROVENT) 0.02 % Inhalation Solution 2.5 mL (0.5 mg total) by Nebulization route ONCE EVERY 4 HOURS   . lisinopriL (PRINIVIL) 5 mg Oral Tablet Take 1 tablet by mouth once daily (Patient taking differently: Take 5 mg by mouth Once a day Take 1 tablet by mouth once daily)   . metoprolol succinate (TOPROL-XL) 50 mg Oral Tablet Sustained Release 24 hr Take 1 Tablet (50 mg total) by mouth Once a day for 30 days   . oxygen (O2) Inhalation gas Take 3-4 L/min by inhalation continuous. concentrator and portable.  Indications: COPD with Chronic Bronchitis     Allergies   Allergen Reactions   . Codeine Nausea/ Vomiting     Sick to stomach   . Hydrocodone Nausea/ Vomiting   . Metformin Diarrhea   . Oxycodone Nausea/ Vomiting     Past Medical History:   Diagnosis Date   . COPD (chronic obstructive pulmonary disease) (CMS HCC)    . Degenerative joint disease involving multiple joints    . Diabetes mellitus (CMS [place])    . HTN (hypertension)    . Smoker    . 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 Wears glasses          Past Surgical History:   Procedure Laterality Date   . HX CARPAL TUNNEL RELEASE           Family Medical  History:     Problem Relation (Age of Onset)    COPD Mother    Coronary Artery Disease Father    Diabetes Mother, Sister, Brother, Paternal Grandmother            Social History     Socioeconomic History   . Marital status: Married     Spouse name: Not on file   . Number of children: Not on file   . Years of education: Not on file   . Highest education level: Not on file   Tobacco Use   . Smoking status: Current Every Day Smoker     Packs/day: 1.00     Years: 20.00  Pack years: 20.00     Types: Cigarettes   . Smokeless tobacco: Never Used   Vaping Use   . Vaping Use: Never used   Substance and Sexual Activity   . Alcohol use: No   . Drug use: No   Other Topics Concern     Social Determinants of Health     Financial Resource Strain:    . Difficulty of Paying Living Expenses:    Food Insecurity:    . Worried About Charity fundraiser in the Last Year:    . Arboriculturist in the Last Year:    Transportation Needs:    . Film/video editor (Medical):    Marland Kitchen Lack of Transportation (Non-Medical):    Physical Activity:    . Days of Exercise per Week:    . Minutes of Exercise per Session:    Stress:    . Feeling of Stress :    Intimate Partner Violence:    . Fear of Current or Ex-Partner:    . Emotionally Abused:    Marland Kitchen Physically Abused:    . Sexually Abused:        BP 136/72   Pulse (!) 109   Temp 36.4 C (97.5 F)   Resp (!) 23   Ht 1.6 m (5\' 3" )   Wt 88.5 kg (195 lb)   LMP  (LMP Unknown)   SpO2 96%   BMI 34.54 kg/m         Physical Exam   Physical Exam  Constitutional:       General: She is not in acute distress.     Appearance: She is toxic-appearing and diaphoretic.   HENT:      Head: Normocephalic and atraumatic.      Mouth/Throat:      Pharynx: No oropharyngeal exudate.   Eyes:      General: No scleral icterus.        Right eye: No discharge.         Left eye: No discharge.      Conjunctiva/sclera: Conjunctivae normal.      Pupils: Pupils are equal, round, and reactive to light.   Neck:      Thyroid: No  thyromegaly.      Vascular: No JVD.   Cardiovascular:      Rate and Rhythm: Normal rate and regular rhythm.      Heart sounds: Normal heart sounds. No murmur heard.   No friction rub. No gallop.    Pulmonary:      Effort: No respiratory distress.      Breath sounds: Wheezing present. No rales.   Chest:      Chest wall: No tenderness.   Abdominal:      General: There is no distension.      Palpations: There is no mass.      Tenderness: There is no abdominal tenderness. There is no guarding or rebound.   Musculoskeletal:         General: No tenderness or deformity. Normal range of motion.      Cervical back: Normal range of motion and neck supple.   Lymphadenopathy:      Cervical: No cervical adenopathy.   Skin:     Capillary Refill: Capillary refill takes more than 3 seconds.      Coloration: Skin is not pale.      Findings: Erythema, lesion and rash present.   Neurological:      Mental Status: She is alert  and oriented to person, place, and time.      Cranial Nerves: No cranial nerve deficit.      Motor: No abnormal muscle tone.      Coordination: Coordination normal.      Deep Tendon Reflexes: Reflexes are normal and symmetric. Reflexes normal.   Psychiatric:         Judgment: Judgment normal.         Examination  Vitals:  Temperature: 36.4 C (97.5 F) (07/24/19 0813)  Heart Rate: (!) 109 (07/24/19 0813)  Respiratory Rate: (!) 23 (07/24/19 0813)  BP (Non-Invasive): 136/72 (07/24/19 0813)  Height: 160 cm (5\' 3" )/Weight: 88.5 kg (195 lb)    Integumentary Assessment  Wound Assessment  Nursing Notes:   Laurence Aly, CASE MANAGER  07/24/19 0846  Signed     07/24/19 0800   Wound (Non-Surgical) Lower;Left;Anterior Leg   Initial Date of Wound Assessment/Initial Time of Wound Assessment: 07/24/19 0827   Wound Approximate Age at First Assessment (Weeks): 4 weeks  Primary Wound Type: Venous Ulcer  Orientation: Lower;Left;Anterior  Location: Leg   Wound Image    WOUND SITE CLOSURE None   Periwound Pink;Edema;Necrotic    Wound Bed 76%-100% fibrin   Wound Length (cm) 20 cm   Wound Width (cm) 14 cm   Wound Depth (cm) 0.1 cm   Wound Volume (cm^3) 28 cm^3   Wound Surface Area (cm^2) 280 cm^2   Wound Undermining No   Wound Tunneling  No   Wound Drainage Amount Copious   Exudate/Drainage Serous   Wound Odor Absent   DRESSING TYPE ABD Pad;Soft Roll;Adhesive Tape  (vasoline)   DRESSING STATUS Removed   Wound (Non-Surgical) Lower;Anterior;Right Leg   Initial Date of Wound Assessment/Initial Time of Wound Assessment: 07/24/19 0828   Wound Approximate Age at First Assessment (Weeks): 4 weeks  Orientation: Lower;Anterior;Right  Location: Leg   Wound Image    WOUND SITE CLOSURE None   Periwound Pink;Edema;Inflamed;Maceration   Wound Bed 76%-100% fibrin   Wound Length (cm) 20 cm   Wound Width (cm) 17 cm   Wound Depth (cm) 0.1 cm   Wound Volume (cm^3) 34 cm^3   Wound Surface Area (cm^2) 340 cm^2   Wound Undermining No   Wound Tunneling  No   Wound Drainage Amount Copious   Exudate/Drainage Serous   Wound Odor Absent   DRESSING TYPE ABD Pad;Soft Roll;Adhesive Tape   DRESSING STATUS Removed   Wound (Non-Surgical) Left;Dorsal Foot   Initial Date of Wound Assessment/Initial Time of Wound Assessment: 07/24/19 0828   Wound Approximate Age at First Assessment (Weeks): 4 weeks  Primary Wound Type: Venous Ulcer  Orientation: Left;Dorsal  Location: Foot   WOUND SITE CLOSURE None   Periwound Pink;Edema;Maceration   Wound Bed 76%-100% fibrin   Wound Length (cm) 3 cm   Wound Width (cm) 2 cm   Wound Depth (cm) 0.1 cm   Wound Volume (cm^3) 0.6 cm^3   Wound Surface Area (cm^2) 6 cm^2   Wound Undermining No   Wound Tunneling  No   Wound Drainage Amount Copious   Exudate/Drainage Serous   Wound Odor Absent   DRESSING TYPE ABD Pad;Soft Roll;Adhesive Tape   Wound (Non-Surgical) Dorsal;Right Foot   Initial Date of Wound Assessment/Initial Time of Wound Assessment: 07/24/19 0829   Wound Approximate Age at First Assessment (Weeks): 4 weeks  Orientation: Dorsal;Right   Location: Foot   WOUND SITE CLOSURE None   Periwound Pink;Edema;Maceration   Wound Bed 76%-100% fibrin   Wound Length (  cm) 4 cm   Wound Width (cm) 3 cm   Wound Depth (cm) 0.1 cm   Wound Volume (cm^3) 1.2 cm^3   Wound Surface Area (cm^2) 12 cm^2   Wound Undermining No   Wound Tunneling  No   Wound Drainage Amount Copious   Exudate/Drainage Serous   Wound Odor Absent   DRESSING TYPE ABD Pad;Soft Roll;Adhesive Tape   DRESSING STATUS Removed       PROCEDURES  no Procedure Note    Infection status:   inflammation seemingly not cellulitis  @ENCORDICORDWOUND @  No orders of the defined types were placed in this encounter.      Neuro/Vascular Assessment  LLE Temperature: warm  LLE Color: red, ruddy  LLE Sensation: tingling present, numbness present  LLE Claudication: No  LLE Hair present: No  LLE Capillary refill: equal to/less than 3 secs  LLE Edema:  Extremely Severe  LLE Varicosities: No  RLE Temperature: warm  RLE Color: red, ruddy  RLE Sensation: numbness present, tingling present  RLE Claudication: No  RLE Hair present: No  RLE Capillary refill: equal to/less than 3 secs  RLE Edema:  Extremely Severe  RLE Varicosities: No        @ENCORDVASCWOUND @    Nutritional Assessment:    Recent Labs     07/11/19  1500   ALBUMIN 2.4*     Nutritional Status discussed with patient.    Diabetes Assessment:  Lab Results   Component Value Date    GLUCPOCT 254 (H) 10/11/2012    GLUIP 359 (H) 07/24/2019   ]  @RECENTA1C (52W)@  Counseling and instruction given today.  Management of daily FSBS deferred to PCP.      Drugs affecting wound  None    Social support:  None    ASSESSMENT/PLAN    Problem List Items Addressed This Visit     None      Visit Diagnoses     Venous insufficiency    -  Primary    Relevant Orders    US ARTERIAL DOPPLER LOWER EXTREMITY BILATERAL (PERIPHERAL ARTERY DUPLEX - LOWER)    PULSE VOLUME RECORDING W ABI - LOWER    DRESSING CHANGE    POCT BLOOD GLUCOSE/FINGERSTICK (AMB)    FOLLOW-UP: WOUND CARE - RETURN VISIT -  [facility] - [place], [place]    DISCHARGE INSTRUCTION - WOUND CARE          Offloading status/Pressure Reduction:    wheelchair  I personally reviewed the critical need for wound offloading.     Edema control:    Tubigrip    Referrals:    Nutritionist Yes. If Albumin < 3.5.  Diabetic Educator yes . If A1C > 7.  Endocrinology No. If A1C > 10.  PT/OT/Seating Clinic No  IR/Vascular No at this point we ordered stat ABI    HBO utility:   does not meet criteria    F/U:  Follow up with physician in 1 week. Apply Medihoney elevation will plan to wrap if possible to the wound. Patient is not a candidate for skin substitute.       Flora Lipps, MD  07/24/2019, 09:11

## 2019-07-24 NOTE — Patient Instructions (Addendum)
Comments    remove tubigrip at night and reapply in the mornings         Order Questions    Question Answer Comment   Cleanse wound with SOAP AND WATER    Type of Compression Wrap TUBIGRIP    Change Dressing Daily          IF YOU ARE EXPERIENCING ANY OF THE FOLLOWING SYMPTOMS, PLEASE CALL us BEFORE YOUR APPOINTMENT, AS YOU MAY NEED TO BE RESCHEDULED.     *FEVER   *COUGH   *FATIGUE/MUSCLE ACHES   *HEADACHE   *LOSS OF TASTE OR SMELL   *SORE THROAT   *CONGESTION OR RUNNY NOSE   *NAUSEA OR VOMITING   *DIARRHEA    THANK YOU IN ADVANCE FOR HELPING TO KEEP OTHER PATIENTS AND STAFF SAFE.Smoking Cessation resources are available by calling 1-800-QUIT-NOW [phone])    Thank you for choosing [facility] for your medical care.  We hope you were satisfied with the care you received.  Please contact the Patient Advocate at [phone] if you have any questions or concerns related to your care.    If you experience a life threatening emergency, call 911 immediately or proceed to the closet ER.     If someone you know is dealing with an addiction issue and seeking help call the [facility] at 1-844-HELP4WV [phone]).      If you need to schedule an appointment such as an ultrasound or an MRI or other advanced imaging, please call [facility] scheduling at [phone].    Please remember to take ALL your belongings with you when you leave your appointment.      To assist [place] in providing quality care, you may receive a patient satisfaction survey in the mail.  Please take a few minutes to complete the survey, as we value your feedback and look for opportunities to serve you better. Tip for Including Protein in Your Diet    Hard or semi soft cheese (e.g. Cheddar, Jack, brick) Melt on sandwiches, bread, muffins, tortillas, hamburgers, hot dogs, other meats or fish, vegetables, eggs and desserts such as stewed fruit or pies.    Grate and add to soups, sauces, casseroles, vegetable dishes, potatoes,  rice noodles or meat loaf    Serve as a snack with crackers or bagels.   Cottage Cheese/Ricotta Cheese Mix with or use to stuff fruits and vegetables    Add to casseroles, spaghetti, noodles or egg dishes such as omelets, scrambled eggs and souffles.    Use in gelatin, pudding type desserts, cheesecake and pancake or waffle batter    Use to stuff crepes, pasta shells or manicotti    Puree and use as a substitute for sour cream   Milk Use in beverages and in cooking    Use in preparing foods , such as hot cereal, soups, cocoa or pudding    Add cream sauces to vegetable and other dishes    Use evaporated milk, evaporated skim milk or sweetened condensed milk instead of milk or water in recipes   Non-fat Dry Milk Add 1/3 cup nonfat dry powered milk to each cup of regular milk for "double strength" milk.    Add to yogurt and milk drinks such as pasteurized eggnog and milkshakes      Add to scrambled eggs and mashed potatoes    Use in casseroles, meat loaf, hot cereal, breads, muffins, sauces, cream soups, puddings and custards and other milk based desserts.    Caution:  Nonfat dry milk may contain bacteria.  Add nonfat dry milk to a small amount of boiling water before adding to beverages or food that will not be cooked prior to use.  These mixed beverages and food items need to be refrigerated promptly and used within 24 hours of the mixing time.   Commercial Protein Supplements Use nutrition supplements such as instant breakfast, Sustacal , Ensure , [employer] , [employer] , [employer]  or [employer] .  These may be purchased in grocery stores or pharmacy departments.    Use instant breakfast powder in milk drinks and desserts such as pudding    Mix with ice cream, milk and fruit or flavorings for a high-protein milk shake    Add powdered protein powder, such as ProCal Supplement , Promod , Pro Source  to foods such as soups, custards,puddings or orange juice or lemonade.   Ice Cream, Yogurt, and Frozen  Yogurt Add to carbonated beverage such as ginger ale    Add to milk drinks such a milkshakes    Add to cereals, fruits, gelatin desserts and pies    Blend or whip wit soft or cooked fruits    Sandwich ice cream or frozen yogurt between enriched cake slices, cookies or graham crackers    Use seasoned yogurt as a dip for fruits, vegetables or chips    Use yogurt in place of sour cream in casseroles   Eggs, Egg Whites and Egg Yolks Add chopped , hard cooked eggs to salads and dressings, vegetables, casseroles and creamed meats    Beat eggs into mashed potatoes, vegetable purees and sauces    Add extra whites to quiches, scrambled eggs, custards, puddings and pancake and [place] toast batter,    Make a rich custard with egg yolks, double strength milk and sugar.    Add extra hard-cooked yolks to deviled egg filling and sandwich spreads    Caution:  Do not serve raw or undercooked egg products die to risk of salmonella bacteria causing illness.    Note:  Use pasteurized liquid eggs if the food will not be thoroughly cooked,  Pasteurized liquid eggs are available in the frozen food section of grocery stores   Nuts. Seeds and Wheat Germ Add to casseroles, breads, muffins, pancakes, cookies and waffles.    Sprinkle on fruit, cereal, ice cream, yogurt, vegetables, salads and toast as a crunchy topping.    Use in place of breadcrumbs    Blend with parsley or spinach, herbs and cream for a noodle, pasta or vegetable sauce    Roll banana in chopped nuts   Peanut buter Spread on sandwiches, toast, muffins, crackers, waffles, pancakes or fruit slices.    Use as a dip for raw vegetables, such as carrots, cauliflower and celery    Blend with milk drinks and beverages    Swirl through soft ice cream and yogurt    Spread on a banana and then roll banana in crushed cereal or chopped nuts.   Meat and Fish Add chopped cooked meat or fish to vegetables, salads,  casseroles, soups, sauces and biscuit dough.    Use in omelets, souffles,  quiches and sandwich fillings.    Add chicken and [place] to [employer] in piecrust or biscuit dough as turnovers    Add to stuffed baked potatoes    Add pureed meat to soups    Eat calves or chicken liver or heart, which are especially good sources of protein, vitamins  and minerals   Beans Cook and use dried peas, beans and bean curd (tofu) in soups or add to casseroles, pastas and grain dishes that also contain cheese or meat.    Mash with cheese and milk    Use tofu to make smoothies.

## 2019-07-24 NOTE — Nursing Note (Signed)
07/24/19 0800   Wound (Non-Surgical) Lower;Left;Anterior Leg   Initial Date of Wound Assessment/Initial Time of Wound Assessment: 07/24/19 0827   Wound Approximate Age at First Assessment (Weeks): 4 weeks  Primary Wound Type: Venous Ulcer  Orientation: Lower;Left;Anterior  Location: Leg   Wound Image    WOUND SITE CLOSURE None   Periwound Pink;Edema;Necrotic   Wound Bed 76%-100% fibrin   Wound Length (cm) 20 cm   Wound Width (cm) 14 cm   Wound Depth (cm) 0.1 cm   Wound Volume (cm^3) 28 cm^3   Wound Surface Area (cm^2) 280 cm^2   Wound Undermining No   Wound Tunneling  No   Wound Drainage Amount Copious   Exudate/Drainage Serous   Wound Odor Absent   DRESSING TYPE ABD Pad;Soft Roll;Adhesive Tape  (vasoline)   DRESSING STATUS Removed   Wound (Non-Surgical) Lower;Anterior;Right Leg   Initial Date of Wound Assessment/Initial Time of Wound Assessment: 07/24/19 0828   Wound Approximate Age at First Assessment (Weeks): 4 weeks  Orientation: Lower;Anterior;Right  Location: Leg   Wound Image    WOUND SITE CLOSURE None   Periwound Pink;Edema;Inflamed;Maceration   Wound Bed 76%-100% fibrin   Wound Length (cm) 20 cm   Wound Width (cm) 17 cm   Wound Depth (cm) 0.1 cm   Wound Volume (cm^3) 34 cm^3   Wound Surface Area (cm^2) 340 cm^2   Wound Undermining No   Wound Tunneling  No   Wound Drainage Amount Copious   Exudate/Drainage Serous   Wound Odor Absent   DRESSING TYPE ABD Pad;Soft Roll;Adhesive Tape   DRESSING STATUS Removed   Wound (Non-Surgical) Left;Dorsal Foot   Initial Date of Wound Assessment/Initial Time of Wound Assessment: 07/24/19 0828   Wound Approximate Age at First Assessment (Weeks): 4 weeks  Primary Wound Type: Venous Ulcer  Orientation: Left;Dorsal  Location: Foot   WOUND SITE CLOSURE None   Periwound Pink;Edema;Maceration   Wound Bed 76%-100% fibrin   Wound Length (cm) 3 cm   Wound Width (cm) 2 cm   Wound Depth (cm) 0.1 cm   Wound Volume (cm^3) 0.6 cm^3   Wound Surface Area (cm^2) 6 cm^2   Wound Undermining  No   Wound Tunneling  No   Wound Drainage Amount Copious   Exudate/Drainage Serous   Wound Odor Absent   DRESSING TYPE ABD Pad;Soft Roll;Adhesive Tape   Wound (Non-Surgical) Dorsal;Right Foot   Initial Date of Wound Assessment/Initial Time of Wound Assessment: 07/24/19 0829   Wound Approximate Age at First Assessment (Weeks): 4 weeks  Orientation: Dorsal;Right  Location: Foot   WOUND SITE CLOSURE None   Periwound Pink;Edema;Maceration   Wound Bed 76%-100% fibrin   Wound Length (cm) 4 cm   Wound Width (cm) 3 cm   Wound Depth (cm) 0.1 cm   Wound Volume (cm^3) 1.2 cm^3   Wound Surface Area (cm^2) 12 cm^2   Wound Undermining No   Wound Tunneling  No   Wound Drainage Amount Copious   Exudate/Drainage Serous   Wound Odor Absent   DRESSING TYPE ABD Pad;Soft Roll;Adhesive Tape   DRESSING STATUS Removed

## 2019-07-25 ENCOUNTER — Other Ambulatory Visit (HOSPITAL_BASED_OUTPATIENT_CLINIC_OR_DEPARTMENT_OTHER): Payer: Self-pay | Admitting: Family Medicine

## 2019-07-25 ENCOUNTER — Other Ambulatory Visit: Payer: Self-pay

## 2019-07-25 ENCOUNTER — Other Ambulatory Visit: Payer: Medicaid Other

## 2019-07-25 DIAGNOSIS — I872 Venous insufficiency (chronic) (peripheral): Secondary | ICD-10-CM

## 2019-07-25 MED ORDER — METOPROLOL SUCCINATE ER 50 MG TABLET,EXTENDED RELEASE 24 HR
50.0000 mg | ORAL_TABLET | Freq: Every day | ORAL | 0 refills | Status: DC
Start: 2019-07-25 — End: 2019-08-26

## 2019-07-25 MED ORDER — APIXABAN 5 MG TABLET
5.0000 mg | ORAL_TABLET | Freq: Two times a day (BID) | ORAL | 0 refills | Status: DC
Start: 2019-07-25 — End: 2019-09-12

## 2019-07-25 NOTE — Addendum Note (Signed)
Addended by: Laurence Aly on: 07/25/2019 01:10 PM     Modules accepted: Orders, SmartSet

## 2019-07-25 NOTE — Telephone Encounter (Signed)
Pt requesting refill.  Pt last seen 07/08/19.   Thresa Ross, LPN  4/43/1540, 08:67

## 2019-07-26 ENCOUNTER — Other Ambulatory Visit: Payer: Self-pay

## 2019-07-26 ENCOUNTER — Encounter (HOSPITAL_COMMUNITY): Payer: Self-pay

## 2019-07-26 ENCOUNTER — Ambulatory Visit: Payer: Medicaid Other | Attending: Surgery

## 2019-07-26 ENCOUNTER — Other Ambulatory Visit: Payer: Medicaid Other

## 2019-07-26 DIAGNOSIS — I872 Venous insufficiency (chronic) (peripheral): Secondary | ICD-10-CM | POA: Insufficient documentation

## 2019-07-26 DIAGNOSIS — L97529 Non-pressure chronic ulcer of other part of left foot with unspecified severity: Secondary | ICD-10-CM | POA: Insufficient documentation

## 2019-07-26 DIAGNOSIS — L97929 Non-pressure chronic ulcer of unspecified part of left lower leg with unspecified severity: Secondary | ICD-10-CM | POA: Insufficient documentation

## 2019-07-26 DIAGNOSIS — I83025 Varicose veins of left lower extremity with ulcer other part of foot: Secondary | ICD-10-CM | POA: Insufficient documentation

## 2019-07-26 DIAGNOSIS — L97519 Non-pressure chronic ulcer of other part of right foot with unspecified severity: Secondary | ICD-10-CM | POA: Insufficient documentation

## 2019-07-26 DIAGNOSIS — L97819 Non-pressure chronic ulcer of other part of right lower leg with unspecified severity: Secondary | ICD-10-CM | POA: Insufficient documentation

## 2019-07-26 DIAGNOSIS — I83029 Varicose veins of left lower extremity with ulcer of unspecified site: Secondary | ICD-10-CM | POA: Insufficient documentation

## 2019-07-26 NOTE — Wound Therapy (Signed)
[facility] [place] [facility] [place], [place] [address]    Wound Care Application of Multi-layer Compression Wrap      Procedure date/time: 07/26/2019/ 1230    Procedure: Application of Two-layer compression wrap  Description:    Compression of  both legs      An order was received.   The wound cleansed prior to application of  Two-layer compression wrap.  With the foot in dorsiflexed position the bandages were applied following the instructions of the manufacturer. Patient tolerated procedure without complications or complaints of pain. Patient provided with Patient Education. Reinforced need for patient to continue to observe toes for discoloration or swelling and when to contact physician should the need arise.

## 2019-07-26 NOTE — Patient Instructions (Signed)
Multi-layer compression Wraps    Your physician has ordered compression therapy for treatment of your wound.  Compression reduces the swelling or edema in your legs and prevents it from returning.  The swelling is one reason why your wound may not heal quickly.    Your physician has chosen a compression wrap that has three or four layers.  It can be left in place for up to a week.  Each week, the old wraps will be removed and new ones applied until your wound heals or your treatment has been changed.      Key point to keep in mind while wearing compression wraps:    *  Do not get your wraps wet.   Take a sponge bath or cover the wraps with a plastic bag that is      secure at the top so that no water can enter.    *  Elevate your legs 3-4 times a day for 30 minutes to prevent swelling.   Increase swelling      will cause the wraps to feel tight and may cause pain.    *  Call [facility] ([phone]) if drainage has      come through the wraps or the wraps have slipped down.    *  If you have pain, if your toes become purple, or if you experience swelling, numbness or      tingling in your toes, remove the wraps by carefully cutting off with scissors.    *  Call [facility] if you have removed your wraps so     that your doctor can advise you how to dress your wound until your next appointment.    *  If it is after hours when you remove the wraps, dress your wound with normal saline      moistened gauze covered with dry gauze.      [facility]  Located on the First Floor at [facility]  [phone]  [facility]  at  [facility]    What is a Venous Stasis Ulcer?    A venous stasis ulcer occurs when problem in the veins of the lower leg prevent blood from being effectively pumped back to the heart.  The problem may be valve dysfunction, blockage,  backward blood flow or failure of the calf muscles to pump.  The blood pools in the lower leg which causes swelling, damages tissue and may lead to a wound.    How do I know if I have a Venous Statis Ulcer?    Venous stasis ulcers usually occur in the inner ankle region and on the lower leg.  The ulcer may be large, is usually shallow, edges are irregular in shape, there may be a lot of drainage, and it may be painful to touch.  Often the skin surrounding the wound is dark an reddish/purple in color.  It is best to visit your physician or the [facility] Care Center for evaluation.  They can help.  There are special tests available to help evaluate the ulcer to ensure proper treatment and dressings.    What causes a venous stasis ulcer?    The following increase your risk of developing a venous stasis ulcer:  Lower leg swelling  History of blood clot  High blood pressure  Previous surgery  Standing or sitting   for long periods  Obesity  Family history of venous stasis ulcers  Varicose veins  Leg trauma    Can my venous stasis ulcer be treated?    Yes!  Venous stasis ulcers can be treated with compression therapy to decrease swelling and promote blood flow back to the heart.  The Wound Care Center team will also use special wound dressings that manage drainage and protect your skin.  You may also be a  Candidate for advanced healing option such as corrective surgery and bioengineered tissue grafts.    Prevention    Prevention is the key to not developing venous stasis ulcers.  Once you have an ulcer you are at greater risk for developing ulcers in the future.  Here ar some action s you can take to stay ulcer free:    Wear compression hose that are prescribed by your physician  Avoid crossing your legs  Stop smoking  Maintain an ideal body weight  Keep skin moisturized  Do not stand or sit for long periods of time  Take short walks trough out the day if tolerated  Inspect lower leg skin daily and rapport any breaks in the  skin to your physician.  Take all medications as prescribed  While sitting or standing flex feet and ankles to exercise calf muscles  If legs are swollen or painful, elevate above the level of your heart.    [facility] at  [facility]    [facility] at  [facility] focuses on preserving limbs and healing wounds through a comprehensive team approach.    Our staff includes physicians, nurses, a diabetic educator, Registered Dietitian and therapists who are high-skilled and experienced in healing complex wounds.  Our interdisciplinary team aggressively treats chronic wounds by applying evidence-based, medical and surgical techniques that are outcome focused and cost-effective.    To promote optimal recovery, the program uses the latest techniques, products and services.  Our healing specialists teach patients and their caregivers how to properly handle wounds at home to advance healing.    Teamwork, coordinated care, advanced technologies, easy access and excellent outcomes define the  [facility].Smoking Cessation resources are available by calling 1-800-QUIT-NOW (1-800-784-8669)    Thank you for choosing [facility] for your medical care.  We hope you were satisfied with the care you received.  Please contact the Patient Advocate at [phone] if you have any questions or concerns related to your care.    If you experience a life threatening emergency, call 911 immediately or proceed to the closet ER.     If someone you know is dealing with an addiction issue and seeking help call the HELP4WV Hotline at 1-844-HELP4WV (1844-435-7594).      If you need to schedule an appointment such as an ultrasound or an MRI or other advanced imaging, please call [facility] scheduling at [phone].    Please remember to take ALL your belongings with you when you leave your  appointment.      To assist us in providing quality care, you may receive a patient satisfaction survey in the mail.  Please take a few minutes to complete the survey, as we value your feedback and look for opportunities to serve you better. IF YOU ARE EXPERIENCING ANY OF THE FOLLOWING SYMPTOMS, PLEASE CALL US BEFORE YOUR APPOINTMENT, AS YOU MAY NEED TO BE RESCHEDULED.     *  FEVER   *COUGH   *FATIGUE/MUSCLE ACHES   *HEADACHE   *LOSS OF TASTE OR SMELL   *SORE THROAT   *CONGESTION OR RUNNY NOSE   *NAUSEA OR VOMITING   *DIARRHEA    THANK YOU IN ADVANCE FOR HELPING TO KEEP OTHER PATIENTS AND STAFF SAFE.

## 2019-07-29 ENCOUNTER — Ambulatory Visit: Payer: Medicaid Other | Attending: Surgery

## 2019-07-29 ENCOUNTER — Encounter (HOSPITAL_BASED_OUTPATIENT_CLINIC_OR_DEPARTMENT_OTHER): Payer: Self-pay | Admitting: Family Medicine

## 2019-07-29 ENCOUNTER — Other Ambulatory Visit: Payer: Self-pay

## 2019-07-29 ENCOUNTER — Encounter (HOSPITAL_COMMUNITY): Payer: Self-pay

## 2019-07-29 DIAGNOSIS — I83025 Varicose veins of left lower extremity with ulcer other part of foot: Secondary | ICD-10-CM | POA: Insufficient documentation

## 2019-07-29 DIAGNOSIS — L97819 Non-pressure chronic ulcer of other part of right lower leg with unspecified severity: Secondary | ICD-10-CM | POA: Insufficient documentation

## 2019-07-29 DIAGNOSIS — L97929 Non-pressure chronic ulcer of unspecified part of left lower leg with unspecified severity: Secondary | ICD-10-CM | POA: Insufficient documentation

## 2019-07-29 DIAGNOSIS — I83029 Varicose veins of left lower extremity with ulcer of unspecified site: Secondary | ICD-10-CM | POA: Insufficient documentation

## 2019-07-29 DIAGNOSIS — L97529 Non-pressure chronic ulcer of other part of left foot with unspecified severity: Secondary | ICD-10-CM | POA: Insufficient documentation

## 2019-07-29 DIAGNOSIS — E11621 Type 2 diabetes mellitus with foot ulcer: Secondary | ICD-10-CM | POA: Insufficient documentation

## 2019-07-29 DIAGNOSIS — I872 Venous insufficiency (chronic) (peripheral): Secondary | ICD-10-CM | POA: Insufficient documentation

## 2019-07-29 DIAGNOSIS — L97518 Non-pressure chronic ulcer of other part of right foot with other specified severity: Secondary | ICD-10-CM | POA: Insufficient documentation

## 2019-07-29 NOTE — Wound Therapy (Signed)
[facility] [place] [facility] [place], [place] [address]    Wound Care Application of Multi-layer Compression Wrap      Procedure date/time: 07/29/2019/ 950    Procedure: Application of Two-layer compression wrap  Description:    Compression of  both legs      An order was received.   The wound cleansed prior to application of  Two-layer compression wrap.  With the foot in dorsiflexed position the bandages were applied following the instructions of the manufacturer. Patient tolerated procedure without complications or complaints of pain. Patient provided with Patient Education. Reinforced need for patient to continue to observe toes for discoloration or swelling and when to contact physician should the need arise.

## 2019-07-29 NOTE — Patient Instructions (Signed)
IF YOU ARE EXPERIENCING ANY OF THE FOLLOWING SYMPTOMS, PLEASE CALL US BEFORE YOUR APPOINTMENT, AS YOU MAY NEED TO BE RESCHEDULED.     *FEVER   *COUGH   *FATIGUE/MUSCLE ACHES   *HEADACHE   *LOSS OF TASTE OR SMELL   *SORE THROAT   *CONGESTION OR RUNNY NOSE   *NAUSEA OR VOMITING   *DIARRHEA    THANK YOU IN ADVANCE FOR HELPING TO KEEP OTHER PATIENTS AND STAFF SAFE.Smoking Cessation resources are available by calling 1-800-QUIT-NOW (1-800-784-8669)    Thank you for choosing [facility] for your medical care.  We hope you were satisfied with the care you received.  Please contact the Patient Advocate at [phone] if you have any questions or concerns related to your care.    If you experience a life threatening emergency, call 911 immediately or proceed to the closet ER.     If someone you know is dealing with an addiction issue and seeking help call the HELP4WV Hotline at 1-844-HELP4WV (1844-435-7594).      If you need to schedule an appointment such as an ultrasound or an MRI or other advanced imaging, please call [facility] scheduling at [phone].    Please remember to take ALL your belongings with you when you leave your appointment.      To assist us in providing quality care, you may receive a patient satisfaction survey in the mail.  Please take a few minutes to complete the survey, as we value your feedback and look for opportunities to serve you better. Tip for Including Protein in Your Diet    Hard or semi soft cheese (e.g. Cheddar, Jack, brick) Melt on sandwiches, bread, muffins, tortillas, hamburgers, hot dogs, other meats or fish, vegetables, eggs and desserts such as stewed fruit or pies.    Grate and add to soups, sauces, casseroles, vegetable dishes, potatoes, rice noodles or meat loaf    Serve as a snack with crackers or bagels.   Cottage Cheese/Ricotta Cheese Mix with or use to stuff fruits and vegetables    Add to casseroles, spaghetti, noodles or egg dishes such as omelets,  scrambled eggs and souffles.    Use in gelatin, pudding type desserts, cheesecake and pancake or waffle batter    Use to stuff crepes, pasta shells or manicotti    Puree and use as a substitute for sour cream   Milk Use in beverages and in cooking    Use in preparing foods , such as hot cereal, soups, cocoa or pudding    Add cream sauces to vegetable and other dishes    Use evaporated milk, evaporated skim milk or sweetened condensed milk instead of milk or water in recipes   Non-fat Dry Milk Add 1/3 cup nonfat dry powered milk to each cup of regular milk for "double strength" milk.    Add to yogurt and milk drinks such as pasteurized eggnog and milkshakes      Add to scrambled eggs and mashed potatoes    Use in casseroles, meat loaf, hot cereal, breads, muffins, sauces, cream soups, puddings and custards and other milk based desserts.    Caution:  Nonfat dry milk may contain bacteria.  Add nonfat dry milk to a small amount of boiling water before adding to beverages or food that will not be cooked prior to use.  These mixed beverages and food items need to be refrigerated promptly and used within 24 hours of the mixing time.   Commercial Protein Supplements Use nutrition   supplements such as instant breakfast, Sustacal , Ensure , Great Shake , Health Shake , [employer]  or [employer] .  These may be purchased in grocery stores or pharmacy departments.    Use instant breakfast powder in milk drinks and desserts such as pudding    Mix with ice cream, milk and fruit or flavorings for a high-protein milk shake    Add powdered protein powder, such as ProCal Supplement , Promod , Pro Source  to foods such as soups, custards,puddings or orange juice or lemonade.   Ice Cream, Yogurt, and Frozen Yogurt Add to carbonated beverage such as ginger ale    Add to milk drinks such a milkshakes    Add to cereals, fruits, gelatin desserts and pies    Blend or whip wit soft or cooked fruits    Sandwich ice cream or frozen  yogurt between enriched cake slices, cookies or graham crackers    Use seasoned yogurt as a dip for fruits, vegetables or chips    Use yogurt in place of sour cream in casseroles   Eggs, Egg Whites and Egg Yolks Add chopped , hard cooked eggs to salads and dressings, vegetables, casseroles and creamed meats    Beat eggs into mashed potatoes, vegetable purees and sauces    Add extra whites to quiches, scrambled eggs, custards, puddings and pancake and [place] toast batter,    Make a rich custard with egg yolks, double strength milk and sugar.    Add extra hard-cooked yolks to deviled egg filling and sandwich spreads    Caution:  Do not serve raw or undercooked egg products die to risk of salmonella bacteria causing illness.    Note:  Use pasteurized liquid eggs if the food will not be thoroughly cooked,  Pasteurized liquid eggs are available in the frozen food section of grocery stores   Nuts. Seeds and Wheat Germ Add to casseroles, breads, muffins, pancakes, cookies and waffles.    Sprinkle on fruit, cereal, ice cream, yogurt, vegetables, salads and toast as a crunchy topping.    Use in place of breadcrumbs    Blend with parsley or spinach, herbs and cream for a noodle, pasta or vegetable sauce    Roll banana in chopped nuts   Peanut buter Spread on sandwiches, toast, muffins, crackers, waffles, pancakes or fruit slices.    Use as a dip for raw vegetables, such as carrots, cauliflower and celery    Blend with milk drinks and beverages    Swirl through soft ice cream and yogurt    Spread on a banana and then roll banana in crushed cereal or chopped nuts.   Meat and Fish Add chopped cooked meat or fish to vegetables, salads,  casseroles, soups, sauces and biscuit dough.    Use in omelets, souffles, quiches and sandwich fillings.    Add chicken and turkey to stuffings    Wrap in piecrust or biscuit dough as turnovers    Add to stuffed baked potatoes    Add pureed meat to soups    Eat calves or chicken liver or heart,  which are especially good sources of protein, vitamins and minerals   Beans Cook and use dried peas, beans and bean curd (tofu) in soups or add to casseroles, pastas and grain dishes that also contain cheese or meat.    Mash with cheese and milk    Use tofu to make smoothies.

## 2019-07-31 ENCOUNTER — Other Ambulatory Visit: Payer: Self-pay

## 2019-07-31 ENCOUNTER — Ambulatory Visit (INDEPENDENT_AMBULATORY_CARE_PROVIDER_SITE_OTHER): Payer: Medicaid Other | Admitting: Family Medicine

## 2019-07-31 ENCOUNTER — Encounter (HOSPITAL_COMMUNITY): Payer: Self-pay | Admitting: Surgery

## 2019-07-31 ENCOUNTER — Ambulatory Visit: Payer: Medicaid Other | Attending: Surgery | Admitting: Surgery

## 2019-07-31 ENCOUNTER — Encounter (HOSPITAL_BASED_OUTPATIENT_CLINIC_OR_DEPARTMENT_OTHER): Payer: Self-pay | Admitting: Family Medicine

## 2019-07-31 ENCOUNTER — Other Ambulatory Visit: Payer: Medicaid Other

## 2019-07-31 VITALS — BP 128/58 | HR 80 | Temp 98.5°F | Resp 24 | Ht 63.0 in | Wt 192.0 lb

## 2019-07-31 DIAGNOSIS — I4891 Unspecified atrial fibrillation: Secondary | ICD-10-CM

## 2019-07-31 DIAGNOSIS — L97829 Non-pressure chronic ulcer of other part of left lower leg with unspecified severity: Secondary | ICD-10-CM

## 2019-07-31 DIAGNOSIS — I3139 Other pericardial effusion (noninflammatory): Secondary | ICD-10-CM

## 2019-07-31 DIAGNOSIS — R0902 Hypoxemia: Secondary | ICD-10-CM

## 2019-07-31 DIAGNOSIS — I872 Venous insufficiency (chronic) (peripheral): Secondary | ICD-10-CM | POA: Insufficient documentation

## 2019-07-31 DIAGNOSIS — R011 Cardiac murmur, unspecified: Secondary | ICD-10-CM

## 2019-07-31 DIAGNOSIS — I313 Pericardial effusion (noninflammatory): Secondary | ICD-10-CM

## 2019-07-31 DIAGNOSIS — Z6834 Body mass index (BMI) 34.0-34.9, adult: Secondary | ICD-10-CM

## 2019-07-31 DIAGNOSIS — F17210 Nicotine dependence, cigarettes, uncomplicated: Secondary | ICD-10-CM | POA: Insufficient documentation

## 2019-07-31 DIAGNOSIS — T148XXA Other injury of unspecified body region, initial encounter: Secondary | ICD-10-CM | POA: Insufficient documentation

## 2019-07-31 DIAGNOSIS — L97522 Non-pressure chronic ulcer of other part of left foot with fat layer exposed: Secondary | ICD-10-CM

## 2019-07-31 DIAGNOSIS — L97512 Non-pressure chronic ulcer of other part of right foot with fat layer exposed: Secondary | ICD-10-CM

## 2019-07-31 DIAGNOSIS — E11621 Type 2 diabetes mellitus with foot ulcer: Secondary | ICD-10-CM

## 2019-07-31 DIAGNOSIS — J449 Chronic obstructive pulmonary disease, unspecified: Secondary | ICD-10-CM

## 2019-07-31 LAB — POC FINGERSTICK GLUCOSE - BMC/JMC (RESULTS): GLUCOSE, POC: 128 mg/dl — ABNORMAL HIGH (ref 60–100)

## 2019-07-31 MED ORDER — IPRATROPIUM BROMIDE 0.02 % SOLUTION FOR INHALATION
0.5000 mg | RESPIRATORY_TRACT | 2 refills | Status: DC
Start: 2019-07-31 — End: 2019-08-19

## 2019-07-31 NOTE — Progress Notes (Signed)
[place], [place]  [place]  [place] [address]  Office Visit    ID: JESSIKA ROTHERY   DOB: Dec 21, 1955  Date of Service: 07/31/2019     Chief Complaint(s):   Chief Complaint   Patient presents with    Hospital Follow Up     admitted 07/11/19 for sepsis r/t cellulitis       SUBJECTIVE:  Patient is a 64 year old female coming in for hospital follow up. She was admitted to the hospital at the beginning of the month for sepsis cellulitis. Pt sts that her legs had been very red, swollen, and blistered at the time of her hospital admission. She notes that her symptoms have improved and her legs are feeling better since that time but she does still have some wounds present. Pt mentions that her legs also feel weak at times and she tries to take breaks as needed to avoid overdoing it. She sts that she is following with the wound clinic for management of her symptoms.     Pt sts that she has been waking up struggling to breathe at times despite using her oxygen regularly. She notes that she has been using her nebulizer as needed which helps to improve her breathing. Pt mentions that her oxygen saturation has been between 94-96% at home while on 4 liters of oxygen. She sts that she has not followed with a pulmonologist yet and has not been evaluated by cardiology.     Pt mentions that she has been monitoring her blood sugar and it has been well controlled recently. She sts that her blood sugar registered at 128 this morning. Pt notes that she has been working on improving her diet and exercising more when possible.     ROS:  Constitutional: Denies fevers, chills, night sweats. No recent weight changes or fatigue.   Eyes: Denies change in vision. No irritation, erythema, discharge or pain.   Ears: Denies any difficulty with hearing. No ear pain, tinnitus or discharge.   Mouth/Throat: Denies any oral ulcers or other lesions. No sore throat, hoarseness or dysphagia.  Cardiovascular: Denies any chest pain,  palpitations, PND or DOE  Respiratory: Positive for intermittent shortness of breath. Denies any wheezing or cough   GI: Denies any abdominal pain, nausea, vomiting, diarrhea or constipation. No melena or hematochezia.  GU: Denies any dysuria, frequency, hematuria, hesitancy. Denies nocturia or incontinence.  Neurological: Positive for intermittent weakness of the bilateral lower extremities. Denies any dizziness or headaches.  Skin: Positive for improving bilateral leg wounds.     Past Medical History:  Patient Active Problem List    Diagnosis Date Noted    COPD (chronic obstructive pulmonary disease) (CMS [facility]) 07/12/2019    Hypokalemia 07/12/2019    Atrial fibrillation (CMS HCC) 07/12/2019    Sepsis due to cellulitis (CMS [facility]) 07/11/2019    COPD exacerbation (CMS HCC) 06/26/2019    Atrial fibrillation with rapid ventricular response (CMS [facility]) 06/18/2019    Acute hypoxemic respiratory failure (CMS [facility]) 12/04/2018    Sepsis 10/27/2018    CAP (community acquired pneumonia) 10/26/2018    Acute exacerbation of chronic obstructive pulmonary disease (COPD) (CMS [facility]) 03/27/2018    COPD with acute exacerbation (CMS [facility]) 05/23/2017    Tobacco use disorder 05/22/2017    Acute respiratory failure (CMS HCC) 05/21/2017    Non-compliance 03/27/2017    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       Obesity (BMI 35.0-39.9 without comorbidity) 10/08/2012     Medications:  Current Outpatient Medications   Medication Sig    ADVAIR DISKUS 500-50 mcg/dose Inhalation Disk with Device oral diskus inhaler INHALE 1 DOSE BY MOUTH TWICE DAILY (Patient taking differently: Take 1 INHALATION by inhalation Twice daily INHALE 1 DOSE BY MOUTH TWICE DAILY)    albuterol (PROVENTIL) 2.5 mg/0.5 mL Inhalation Solution for Nebulization USE 1 VIAL IN NEBULIZER EVERY 4  HOURS (Patient taking differently: 2.5 mg by Nebulization route Every 4 hours as needed  for bronchospasm prevention)    albuterol sulfate (PROVENTIL HFA) 90 mcg/actuation Inhalation HFA Aerosol Inhaler Take 2 Puffs by inhalation Four times a day. Indications: chronic obstructive pulmonary disease    albuterol sulfate (PROVENTIL) 2.5 mg /3 mL (0.083 %) Inhalation Solution for Nebulization USE 1 VIAL IN NEBULIZER 4 TIMES DAILY    apixaban (ELIQUIS) 5 mg Oral Tablet Take 1 Tablet (5 mg total) by mouth Twice daily    atorvastatin (LIPITOR) 40 mg Oral Tablet Take 1 tablet by mouth once daily (Patient taking differently: Take 40 mg by mouth Every evening )    ATROVENT HFA 17 mcg/actuation Inhalation HFA Aerosol Inhaler oral inhaler INHALE 2 PUFFS BY MOUTH 4 TIMES DAILY    furosemide (LASIX) 20 mg Oral Tablet Take 1 Tablet (20 mg total) by mouth Once a day for 30 days    insulin glargine (LANTUS SOLOSTAR U-100 INSULIN) 100 unit/mL Subcutaneous Insulin Pen 30 Units by Subcutaneous route Twice daily    ipratropium (ATROVENT) 0.02 % Inhalation Solution 2.5 mL (0.5 mg total) by Nebulization route ONCE EVERY 4 HOURS    lisinopriL (PRINIVIL) 5 mg Oral Tablet Take 1 tablet by mouth once daily (Patient taking differently: Take 5 mg by mouth Once a day Take 1 tablet by mouth once daily)    metoprolol succinate (TOPROL-XL) 50 mg Oral Tablet Sustained Release 24 hr Take 1 Tablet (50 mg total) by mouth Once a day for 30 days    oxygen (O2) Inhalation gas Take 3-4 L/min by inhalation continuous. concentrator and portable. May increase oxygen to 4 L NC with activity as needed for shortness of breath.   Indications: COPD with Chronic Bronchitis      Allergies:  Allergies   Allergen Reactions    Codeine Nausea/ Vomiting     Sick to stomach    Hydrocodone Nausea/ Vomiting    Metformin Diarrhea    Oxycodone Nausea/ Vomiting      Social History:  Social History     Tobacco Use    Smoking status: Current Every Day Smoker     Packs/day: 1.00       Years: 20.00     Pack years: 20.00     Types: Cigarettes    Smokeless tobacco: Never Used   Brewing technologist Use: Never used   Substance Use Topics    Alcohol use: No    Drug use: No      OBJECTIVE:  BP (!) 128/58    Pulse 80    Temp 36.9 C (98.5 F) (Oral)    Resp (!) 24    Ht 1.6 m (5\' 3" )    Wt 87.1 kg (192 lb)    LMP  (LMP Unknown)    BMI 34.01 kg/m  Physical Exam:  General: No apparent acute distress. Very pleasant.   Eyes: Conjunctiva are clear. No discharge or icterus noted.  HENT: Mucous membranes are moist. Nares are clear. Posterior oropharynx is clear without erythema or exudates.    Neck: Supple.  Lungs: Clear to auscultation bilaterally. Good air movement. No wheezes or rhonchi. She has a 4 liter nasal cannula on, no respiratory distress.   Cardiovascular: Normal rate and regular rhythm. Loud 3/6 systolic murmur best heard at the left sternal border. No rubs or gallops.  Extremities: Atraumatic. No cyanosis. No significant peripheral edema.  Skin: Warm and dry. No significant rashes or lesions  Neurologic: Strength and sensation grossly normal throughout. Patient in a wheelchair.   Psychiatric: Alert and oriented x 3. Affect within normal limits.    ASSESSMENT and PLAN:  1. Atrial fibrillation (CMS HCC)    2. Chronic obstructive pulmonary disease, unspecified COPD type (CMS HCC)    3. Pericardial effusion    4. Hypoxia    5. Murmur         1. History of pericardial effusion/CHF:  Stat echocardiogram ordered to recheck pericardial effusion, she has a loud blowing systolic murmur currently. She does have a new O2 requirement of 4 liters. Patient referred to cardiology. Continue taking Lasix.     2. Atrial Fibrillation:  Continue Eliquis, rate controlled.     3. COPD:  Referred to pulmonology. Continue Advair and nebulizers. Counseled on quitting smoking.     4. Leg wounds:  Followed by wound clinic, improving.       I am scribing for, and in the presence of, Dr. Sabra Heck for services provided  on 07/31/2019.  Angel Riggin, SCRIBE   [place], [place]  07/31/2019, 11:32      Visit Diagnoses and associated Orders -- Summary:        ICD-10-CM    1. Atrial fibrillation (CMS HCC)  I48.91    2. Chronic obstructive pulmonary disease, unspecified COPD type (CMS HCC)  J44.9 Refer to [facility]   3. Pericardial effusion  I31.3 TRANSTHORACIC ECHOCARDIOGRAM - ADULT   4. Hypoxia  R09.02 TRANSTHORACIC ECHOCARDIOGRAM - ADULT   5. Murmur  R01.1 TRANSTHORACIC ECHOCARDIOGRAM - ADULT       Portions of this note may be dictated using voice recognition software. Variances in spelling and vocabulary are possible and unintentional. Not all errors are caught/corrected. Please notify the [facility] if any discrepancies are noted or if the meaning of any statement is not clear.     I personally performed the services described in this documentation, as scribed  in my presence, and it is both accurate  and complete.    Hubbard Robinson, [place], DO  07/31/2019, 13:08

## 2019-07-31 NOTE — Nursing Note (Signed)
Chief Complaint:   [facility] Follow Up admitted 07/11/19 for sepsis r/t cellulitis        Functional Health Screen  Functional Health Screening:   Patient is under 18: No  Have you had a recent unexplained weight loss or gain?: No  Because we are aware of abuse and domestic violence today, we ask all patients: Are you being hurt, hit, or frightened by anyone at your home or in your life?: No  Do you have any basic needs within your home that are not being met? (such as Food, Shelter, Games developer, Transportation): No  Patient is under 18 and therefore has no Advance Directives: No  Patient has: No Advance  Patient has Advance Directive: No  Patient offered: Refused Packet  Screening unable to be completed: No       BP (!) 128/58    Pulse 80    Temp 36.9 C (98.5 F) (Oral)    Resp (!) 24    Ht 1.6 m (5' 3")    Wt 87.1 kg (192 lb)    LMP  (LMP Unknown)    BMI 34.01 kg/m       Social History     Tobacco Use   Smoking Status Current Every Day Smoker    Packs/day: 1.00    Years: 20.00    Pack years: 20.00    Types: Cigarettes   Smokeless Tobacco Never Used     Patient Health Rating           Depression Screening  PHQ Questionnaire     Allergies:  Allergies   Allergen Reactions    Codeine Nausea/ Vomiting     Sick to stomach    Hydrocodone Nausea/ Vomiting    Metformin Diarrhea    Oxycodone Nausea/ Vomiting     Medication History  Reviewed for OTC medication and any new medications, provider will review medication history  Results through Enter/Edit  Results for orders placed or performed in visit on 07/31/19 (from the past 24 hour(s))   POC FINGERSTICK GLUCOSE - [facility] (RESULTS)    Collection Time: 07/31/19  8:42 AM   Result Value Ref Range    GLUCOSE, POC 128 (H) 60 - 100 mg/dl     POCT Results  Care Team  Patient Care Team:  Hubbard Robinson, DO as PCP - General ([place])  Immunizations - last 24 hours     None        Hassan Buckler, RN  07/31/2019, 11:02

## 2019-07-31 NOTE — Patient Instructions (Addendum)
Order Questions    Question Answer Comment   Cleanse wound with SOAP AND WATER    Apply to wound  gelocast, barrier cream   Type of Compression Wrap 2 LAYER    Apply wrap to BILATERAL LEGS    Change Dressing Other (Specify in Comments) weekly         Smoking Cessation resources are available by calling 1-800-QUIT-NOW [phone])    Thank you for choosing [facility] for your medical care.  We hope you were satisfied with the care you received.  Please contact the Patient Advocate at [phone] if you have any questions or concerns related to your care.    If you experience a life threatening emergency, call 911 immediately or proceed to the closet ER.     If someone you know is dealing with an addiction issue and seeking help call the [facility] at [phone]-[phone]).      If you need to schedule an appointment such as an ultrasound or an MRI or other advanced imaging, please call [facility] scheduling at [phone].    Please remember to take ALL your belongings with you when you leave your appointment.      To assist [place] in providing quality care, you may receive a patient satisfaction survey in the mail.  Please take a few minutes to complete the survey, as we value your feedback and look for opportunities to serve you better. IF YOU ARE EXPERIENCING ANY OF THE FOLLOWING SYMPTOMS, PLEASE CALL us BEFORE YOUR APPOINTMENT, AS YOU MAY NEED TO BE RESCHEDULED.     *FEVER   *COUGH   *FATIGUE/MUSCLE ACHES   *HEADACHE   *LOSS OF TASTE OR SMELL   *SORE THROAT   *CONGESTION OR RUNNY NOSE   *NAUSEA OR VOMITING   *DIARRHEA    THANK YOU IN ADVANCE FOR HELPING TO KEEP OTHER PATIENTS AND STAFF SAFE.Tip for Including Protein in Your Diet    Hard or semi soft cheese (e.g. Cheddar, Jack, brick) Melt on sandwiches, bread, muffins, tortillas, hamburgers, hot dogs, other meats or fish, vegetables, eggs and desserts such as stewed fruit or pies.    Grate and add to soups, sauces, casseroles,  vegetable dishes, potatoes, rice noodles or meat loaf    Serve as a snack with crackers or bagels.   Cottage Cheese/Ricotta Cheese Mix with or use to stuff fruits and vegetables    Add to casseroles, spaghetti, noodles or egg dishes such as omelets, scrambled eggs and souffles.    Use in gelatin, pudding type desserts, cheesecake and pancake or waffle batter    Use to stuff crepes, pasta shells or manicotti    Puree and use as a substitute for sour cream   Milk Use in beverages and in cooking    Use in preparing foods , such as hot cereal, soups, cocoa or pudding    Add cream sauces to vegetable and other dishes    Use evaporated milk, evaporated skim milk or sweetened condensed milk instead of milk or water in recipes   Non-fat Dry Milk Add 1/3 cup nonfat dry powered milk to each cup of regular milk for "double strength" milk.    Add to yogurt and milk drinks such as pasteurized eggnog and milkshakes      Add to scrambled eggs and mashed potatoes    Use in casseroles, meat loaf, hot cereal, breads, muffins, sauces, cream soups, puddings and custards and other milk based desserts.    Caution:  Nonfat dry milk may contain bacteria.  Add nonfat dry milk to a small amount of boiling water before adding to beverages or food that will not be cooked prior to use.  These mixed beverages and food items need to be refrigerated promptly and used within 24 hours of the mixing time.   Commercial Protein Supplements Use nutrition supplements such as instant breakfast, Sustacal , Ensure , Med[employer]onic , [employer] , [employer]  or [employer] .  These may be purchased in grocery stores or pharmacy departments.    Use instant breakfast powder in milk drinks and desserts such as pudding    Mix with ice cream, milk and fruit or flavorings for a high-protein milk shake    Add powdered protein powder, such as ProCal Supplement , Promod , Pro Source  to foods such as soups, custards,puddings or orange juice or lemonade.      Ice Cream, Yogurt, and Frozen Yogurt Add to carbonated beverage such as ginger ale    Add to milk drinks such a milkshakes    Add to cereals, fruits, gelatin desserts and pies    Blend or whip wit soft or cooked fruits    Sandwich ice cream or frozen yogurt between enriched cake slices, cookies or graham crackers    Use seasoned yogurt as a dip for fruits, vegetables or chips    Use yogurt in place of sour cream in casseroles   Eggs, Egg Whites and Egg Yolks Add chopped , hard cooked eggs to salads and dressings, vegetables, casseroles and creamed meats    Beat eggs into mashed potatoes, vegetable purees and sauces    Add extra whites to quiches, scrambled eggs, custards, puddings and pancake and [place] toast batter,    Make a rich custard with egg yolks, double strength milk and sugar.    Add extra hard-cooked yolks to deviled egg filling and sandwich spreads    Caution:  Do not serve raw or undercooked egg products die to risk of salmonella bacteria causing illness.    Note:  Use pasteurized liquid eggs if the food will not be thoroughly cooked,  Pasteurized liquid eggs are available in the frozen food section of grocery stores   Nuts. Seeds and Wheat Germ Add to casseroles, breads, muffins, pancakes, cookies and waffles.    Sprinkle on fruit, cereal, ice cream, yogurt, vegetables, salads and toast as a crunchy topping.    Use in place of breadcrumbs    Blend with parsley or spinach, herbs and cream for a noodle, pasta or vegetable sauce    Roll banana in chopped nuts   Peanut buter Spread on sandwiches, toast, muffins, crackers, waffles, pancakes or fruit slices.    Use as a dip for raw vegetables, such as carrots, cauliflower and celery    Blend with milk drinks and beverages    Swirl through soft ice cream and yogurt    Spread on a banana and then roll banana in crushed cereal or chopped nuts.   Meat and Fish Add chopped cooked meat or fish to vegetables, salads,  casseroles, soups, sauces and biscuit  dough.    Use in omelets, souffles, quiches and sandwich fillings.    Add chicken and [place] to [employer] in piecrust or biscuit dough as turnovers    Add to stuffed baked potatoes    Add pureed meat to soups    Eat calves or chicken liver or heart, which are especially good sources of  protein, vitamins and minerals   Beans Cook and use dried peas, beans and bean curd (tofu) in soups or add to casseroles, pastas and grain dishes that also contain cheese or meat.    Mash with cheese and milk    Use tofu to make smoothies.

## 2019-07-31 NOTE — Wound Therapy (Signed)
[facility] [place] [facility] [place], [place] [address]    Wound Care Application of Multi-layer Compression Wrap      Procedure date/time: 07/31/2019/ 830    Procedure: Application of Two-layer compression wrap  Description:    Compression of  both legs      An order was received.   The wound cleansed prior to application of  Two-layer compression wrap.  With the foot in dorsiflexed position the bandages were applied following the instructions of the manufacturer. Patient tolerated procedure without complications or complaints of pain. Patient provided with Patient Education. Reinforced need for patient to continue to observe toes for discoloration or swelling and when to contact physician should the need arise.

## 2019-07-31 NOTE — Progress Notes (Signed)
[facility] [place], [place] [address]    Wound Care Progress Note      Date of Service: 07/31/2019  Wynn Maudlin, 64 y.o., White female  Date of Birth:  11-15-1955  MRN: [id number]  PCP: Hubbard Robinson, DO  Information Obtained from: Patient.    Reason for visit:   Chief Complaint   Patient presents with    Wound       HPI:    Past medical history, past surgical history, social history and family history have been reviewed. she complains of an open wound. The wound has been present since last month. The wound is painful. No associated fever/chills. No erythema No foul smelling drainage. Peri wound wnl.  IMPROVING ON FULL WRAP  Social History     Tobacco Use   Smoking Status Current Every Day Smoker    Packs/day: 1.00    Years: 20.00    Pack years: 20.00    Types: Cigarettes   Smokeless Tobacco Never Used       Review of Systems  Review of Systems   Constitutional: Positive for activity change and fatigue. Negative for appetite change, chills, diaphoresis and fever.   HENT: Negative for congestion, dental problem, drooling, facial swelling, rhinorrhea, sore throat and trouble swallowing.    Eyes: Negative for pain, discharge and itching.   Respiratory: Positive for shortness of breath. Negative for apnea, cough, choking, chest tightness, wheezing and stridor.    Cardiovascular: Positive for leg swelling. Negative for chest pain and palpitations.   Gastrointestinal: Negative for abdominal distention, abdominal pain, anal bleeding, blood in stool, constipation, diarrhea, nausea, rectal pain and vomiting.   Endocrine: Negative for cold intolerance, heat intolerance and polydipsia.   Genitourinary: Negative for difficulty urinating, dysuria, enuresis, flank pain, frequency, hematuria and pelvic pain.   Musculoskeletal: Negative for arthralgias, back pain, joint swelling, myalgias, neck pain and neck stiffness.   Skin: Positive for color  change, pallor, rash and wound.   Allergic/Immunologic: Negative for immunocompromised state.   Neurological: Negative for dizziness, seizures, syncope, weakness, light-headedness, numbness and headaches.   Hematological: Negative for adenopathy. Does not bruise/bleed easily.   Psychiatric/Behavioral: Negative for agitation, confusion, hallucinations and suicidal ideas. The patient is not nervous/anxious.      Problem List:  Patient Active Problem List    Diagnosis    COPD (chronic obstructive pulmonary disease) (CMS HCC)    Hypokalemia    Atrial fibrillation (CMS HCC)    Sepsis due to cellulitis (CMS HCC)    COPD exacerbation (CMS HCC)    Atrial fibrillation with rapid ventricular response (CMS HCC)    Acute hypoxemic respiratory failure (CMS HCC)    Sepsis    CAP (community acquired pneumonia)    Acute exacerbation of chronic obstructive pulmonary disease (COPD) (CMS [place])    COPD with acute exacerbation (CMS HCC)    Tobacco use disorder    Acute respiratory failure (CMS HCC)    Non-compliance    Hypertension due to endocrine disorder    Diabetes (CMS HCC)    Chronic hypoxemic respiratory failure (CMS HCC)    Obesity (BMI 35.0-39.9 without comorbidity)       History and vital signs as completed by clinical staff and reviewed by Flora Lipps, MD  Past Medical History  Current Outpatient Medications   Medication Sig    ADVAIR DISKUS 500-50 mcg/dose Inhalation Disk with Device oral diskus inhaler INHALE 1 DOSE BY  MOUTH TWICE DAILY (Patient taking differently: Take 1 INHALATION by inhalation Twice daily INHALE 1 DOSE BY MOUTH TWICE DAILY)    albuterol (PROVENTIL) 2.5 mg/0.5 mL Inhalation Solution for Nebulization USE 1 VIAL IN NEBULIZER EVERY 4 HOURS (Patient taking differently: 2.5 mg by Nebulization route Every 4 hours as needed  for bronchospasm prevention)    albuterol sulfate (PROVENTIL HFA) 90 mcg/actuation Inhalation HFA Aerosol Inhaler Take 2 Puffs by inhalation Four times a day.  Indications: chronic obstructive pulmonary disease    albuterol sulfate (PROVENTIL) 2.5 mg /3 mL (0.083 %) Inhalation Solution for Nebulization USE 1 VIAL IN NEBULIZER 4 TIMES DAILY    apixaban (ELIQUIS) 5 mg Oral Tablet Take 1 Tablet (5 mg total) by mouth Twice daily    atorvastatin (LIPITOR) 40 mg Oral Tablet Take 1 tablet by mouth once daily (Patient taking differently: Take 40 mg by mouth Every evening )    ATROVENT HFA 17 mcg/actuation Inhalation HFA Aerosol Inhaler oral inhaler INHALE 2 PUFFS BY MOUTH 4 TIMES DAILY    furosemide (LASIX) 20 mg Oral Tablet Take 1 Tablet (20 mg total) by mouth Once a day for 30 days    insulin glargine (LANTUS SOLOSTAR U-100 INSULIN) 100 unit/mL Subcutaneous Insulin Pen 30 Units by Subcutaneous route Twice daily    ipratropium (ATROVENT) 0.02 % Inhalation Solution 2.5 mL (0.5 mg total) by Nebulization route ONCE EVERY 4 HOURS    lisinopriL (PRINIVIL) 5 mg Oral Tablet Take 1 tablet by mouth once daily (Patient taking differently: Take 5 mg by mouth Once a day Take 1 tablet by mouth once daily)    metoprolol succinate (TOPROL-XL) 50 mg Oral Tablet Sustained Release 24 hr Take 1 Tablet (50 mg total) by mouth Once a day for 30 days    oxygen (O2) Inhalation gas Take 3-4 L/min by inhalation continuous. concentrator and portable. May increase oxygen to 4 L NC with activity as needed for shortness of breath.   Indications: COPD with Chronic Bronchitis     Allergies   Allergen Reactions    Codeine Nausea/ Vomiting     Sick to stomach    Hydrocodone Nausea/ Vomiting    Metformin Diarrhea    Oxycodone Nausea/ Vomiting     Past Medical History:   Diagnosis Date    COPD (chronic obstructive pulmonary disease) (CMS HCC)     Degenerative joint disease involving multiple joints     Diabetes mellitus (CMS HCC)     HTN (hypertension)     Smoker     Supplemental oxygen dependent     3 lpm O2 via NC    Wears glasses          Past Surgical History:   Procedure Laterality Date     HX CARPAL TUNNEL RELEASE           Family Medical History:     Problem Relation (Age of Onset)    COPD Mother    Coronary Artery Disease Father    Diabetes Mother, Sister, Brother, Paternal Grandmother            Social History     Socioeconomic History    Marital status: Married     Spouse name: Not on file    Number of children: Not on file    Years of education: Not on file    Highest education level: Not on file   Tobacco Use    Smoking status: Current Every Day Smoker     Packs/day: 1.00  Years: 20.00     Pack years: 20.00     Types: Cigarettes    Smokeless tobacco: Never Used   Brewing technologist Use: Never used   Substance and Sexual Activity    Alcohol use: No    Drug use: No   Other Topics Concern     Social Determinants of Health     Financial Resource Strain:     Difficulty of Paying Living Expenses:    Food Insecurity:     Worried About Charity fundraiser in the Last Year:     Arboriculturist in the Last Year:    Transportation Needs:     Film/video editor (Medical):     Lack of Transportation (Non-Medical):    Physical Activity:     Days of Exercise per Week:     Minutes of Exercise per Session:    Stress:     Feeling of Stress :    Intimate Partner Violence:     Fear of Current or Ex-Partner:     Emotionally Abused:     Physically Abused:     Sexually Abused:        BP (!) 149/79    Pulse (!) 113    Temp 36.5 C (97.7 F)    Resp (!) 22    Ht 1.6 m (5' 2.99")    Wt 88.5 kg (195 lb)    LMP  (LMP Unknown)    SpO2 92%    BMI 34.55 kg/m         Problem List:  Patient Active Problem List    Diagnosis    COPD (chronic obstructive pulmonary disease) (CMS HCC)    Hypokalemia    Atrial fibrillation (CMS HCC)    Sepsis due to cellulitis (CMS HCC)    COPD exacerbation (CMS HCC)    Atrial fibrillation with rapid ventricular response (CMS HCC)    Acute hypoxemic respiratory failure (CMS HCC)    Sepsis    CAP (community acquired pneumonia)    Acute exacerbation of  chronic obstructive pulmonary disease (COPD) (CMS [place])    COPD with acute exacerbation (CMS HCC)    Tobacco use disorder    Acute respiratory failure (CMS HCC)    Non-compliance    Hypertension due to endocrine disorder    Diabetes (CMS HCC)    Chronic hypoxemic respiratory failure (CMS HCC)    Obesity (BMI 35.0-39.9 without comorbidity)       History and vital signs as completed by clinical staff and reviewed by Flora Lipps, MD  Past Medical History  Current Outpatient Medications   Medication Sig    ADVAIR DISKUS 500-50 mcg/dose Inhalation Disk with Device oral diskus inhaler INHALE 1 DOSE BY MOUTH TWICE DAILY (Patient taking differently: Take 1 INHALATION by inhalation Twice daily INHALE 1 DOSE BY MOUTH TWICE DAILY)    albuterol (PROVENTIL) 2.5 mg/0.5 mL Inhalation Solution for Nebulization USE 1 VIAL IN NEBULIZER EVERY 4 HOURS (Patient taking differently: 2.5 mg by Nebulization route Every 4 hours as needed  for bronchospasm prevention)    albuterol sulfate (PROVENTIL HFA) 90 mcg/actuation Inhalation HFA Aerosol Inhaler Take 2 Puffs by inhalation Four times a day. Indications: chronic obstructive pulmonary disease    albuterol sulfate (PROVENTIL) 2.5 mg /3 mL (0.083 %) Inhalation Solution for Nebulization USE 1 VIAL IN NEBULIZER 4 TIMES DAILY    apixaban (ELIQUIS) 5 mg Oral Tablet Take 1 Tablet (5 mg total) by mouth Twice  daily    atorvastatin (LIPITOR) 40 mg Oral Tablet Take 1 tablet by mouth once daily (Patient taking differently: Take 40 mg by mouth Every evening )    ATROVENT HFA 17 mcg/actuation Inhalation HFA Aerosol Inhaler oral inhaler INHALE 2 PUFFS BY MOUTH 4 TIMES DAILY    furosemide (LASIX) 20 mg Oral Tablet Take 1 Tablet (20 mg total) by mouth Once a day for 30 days    insulin glargine (LANTUS SOLOSTAR U-100 INSULIN) 100 unit/mL Subcutaneous Insulin Pen 30 Units by Subcutaneous route Twice daily    ipratropium (ATROVENT) 0.02 % Inhalation Solution 2.5 mL (0.5 mg total) by  Nebulization route ONCE EVERY 4 HOURS    lisinopriL (PRINIVIL) 5 mg Oral Tablet Take 1 tablet by mouth once daily (Patient taking differently: Take 5 mg by mouth Once a day Take 1 tablet by mouth once daily)    metoprolol succinate (TOPROL-XL) 50 mg Oral Tablet Sustained Release 24 hr Take 1 Tablet (50 mg total) by mouth Once a day for 30 days    oxygen (O2) Inhalation gas Take 3-4 L/min by inhalation continuous. concentrator and portable. May increase oxygen to 4 L NC with activity as needed for shortness of breath.   Indications: COPD with Chronic Bronchitis     Allergies   Allergen Reactions    Codeine Nausea/ Vomiting     Sick to stomach    Hydrocodone Nausea/ Vomiting    Metformin Diarrhea    Oxycodone Nausea/ Vomiting     Past Medical History:   Diagnosis Date    COPD (chronic obstructive pulmonary disease) (CMS HCC)     Degenerative joint disease involving multiple joints     Diabetes mellitus (CMS HCC)     HTN (hypertension)     Smoker     Supplemental oxygen dependent     3 lpm O2 via NC    Wears glasses          Past Surgical History:   Procedure Laterality Date    HX CARPAL TUNNEL RELEASE           Family Medical History:     Problem Relation (Age of Onset)    COPD Mother    Coronary Artery Disease Father    Diabetes Mother, Sister, Brother, Paternal Grandmother            Social History     Socioeconomic History    Marital status: Married     Spouse name: Not on file    Number of children: Not on file    Years of education: Not on file    Highest education level: Not on file   Tobacco Use    Smoking status: Current Every Day Smoker     Packs/day: 1.00     Years: 20.00     Pack years: 20.00     Types: Cigarettes    Smokeless tobacco: Never Used   Brewing technologist Use: Never used   Substance and Sexual Activity    Alcohol use: No    Drug use: No   Other Topics Concern     Social Determinants of Health     Financial Resource Strain:     Difficulty of Paying Living Expenses:     Food Insecurity:     Worried About Charity fundraiser in the Last Year:     Arboriculturist in the Last Year:    Transportation Needs:     Lack of Transportation (  Medical):     Lack of Transportation (Non-Medical):    Physical Activity:     Days of Exercise per Week:     Minutes of Exercise per Session:    Stress:     Feeling of Stress :    Intimate Partner Violence:     Fear of Current or Ex-Partner:     Emotionally Abused:     Physically Abused:     Sexually Abused:        BP (!) 149/79    Pulse (!) 113    Temp 36.5 C (97.7 F)    Resp (!) 22    Ht 1.6 m (5' 2.99")    Wt 88.5 kg (195 lb)    LMP  (LMP Unknown)    SpO2 92%    BMI 34.55 kg/m         Physical Exam   Physical Exam  Constitutional:       General: She is not in acute distress.     Appearance: She is not diaphoretic.   HENT:      Head: Normocephalic and atraumatic.      Mouth/Throat:      Pharynx: No oropharyngeal exudate.   Eyes:      General: No scleral icterus.        Right eye: No discharge.         Left eye: No discharge.      Conjunctiva/sclera: Conjunctivae normal.      Pupils: Pupils are equal, round, and reactive to light.   Neck:      Thyroid: No thyromegaly.      Vascular: No JVD.   Cardiovascular:      Rate and Rhythm: Normal rate and regular rhythm.      Heart sounds: Normal heart sounds. No murmur heard.   No friction rub. No gallop.    Pulmonary:      Effort: No respiratory distress.      Breath sounds: No wheezing or rales.   Chest:      Chest wall: No tenderness.   Abdominal:      General: There is no distension.      Palpations: There is no mass.      Tenderness: There is no abdominal tenderness. There is no guarding or rebound.   Musculoskeletal:         General: Deformity present. No tenderness. Normal range of motion.      Cervical back: Normal range of motion and neck supple.      Right lower leg: Edema present.      Left lower leg: Edema present.   Lymphadenopathy:      Cervical: No cervical adenopathy.   Skin:      Capillary Refill: Capillary refill takes 2 to 3 seconds.      Coloration: Skin is not pale.      Findings: No erythema or rash.   Neurological:      Mental Status: She is alert and oriented to person, place, and time.      Cranial Nerves: No cranial nerve deficit.      Motor: Weakness present. No abnormal muscle tone.      Coordination: Coordination abnormal.      Gait: Gait abnormal.      Deep Tendon Reflexes: Reflexes are normal and symmetric. Reflexes normal.   Psychiatric:         Judgment: Judgment normal.         Examination  Vitals:  Temperature: 36.5 C (  97.7 F) (07/31/19 0800)  Heart Rate: (!) 113 (07/31/19 0800)  Respiratory Rate: (!) 22 (07/31/19 0800)  BP (Non-Invasive): (!) 149/79 (07/31/19 0800)  Height: 160 cm (5' 2.99")/Weight: 88.5 kg (195 lb)    Integumentary Assessment  Wound Assessment  There are no exam notes on file for this visit.    PROCEDURES  no Procedure Note    Infection status:   No signs/symptoms of infection  @ENCORDICORDWOUND @  No orders of the defined types were placed in this encounter.      Neuro/Vascular Assessment       Most Recent ABI/TBI      Office Visit from 07/24/2019 in [facility]  -- [ordered] filed at... 07/24/2019 0800   Lt ABI  --   Rt TBI  --   Lt TBI  --        @ENCORDVASCWOUND @    Nutritional Assessment:    Recent Labs     07/11/19  1500   ALBUMIN 2.4*     Nutritional Status discussed with patient.    Diabetes Assessment:  Lab Results   Component Value Date    GLUCPOCT 254 (H) 10/11/2012    GLUIP 128 (H) 07/31/2019   ]  @RECENTA1C (52W)@  NA  Management of daily FSBS deferred to PCP.      Drugs affecting wound  None    Social support:  None    ASSESSMENT/PLAN    Problem List Items Addressed This Visit     None      Visit Diagnoses     Venous insufficiency        Relevant Orders    DRESSING CHANGE    POCT BLOOD GLUCOSE/FINGERSTICK (AMB)    FOLLOW-UP: WOUND CARE - RETURN VISIT - [facility] - [place], [place]    DISCHARGE  INSTRUCTION - WOUND CARE          Offloading status/Pressure Reduction:    NA  I personally reviewed the critical need for wound offloading.     Edema control:    Full wrap    Referrals:    Nutritionist No. If Albumin < 3.5.  Diabetic Educator No. If A1C > 7.  Endocrinology No. If A1C > 10.  PT/OT/Seating Clinic No  IR/Vascular No     HBO utility:   does not meet criteria    F/U:  Follow up with physician in 1 week. Apply as above full wrap to the wound. Patient is not a candidate for skin substitute.       Flora Lipps, MD  07/31/2019, 09:17

## 2019-08-01 ENCOUNTER — Ambulatory Visit
Admission: RE | Admit: 2019-08-01 | Discharge: 2019-08-01 | Disposition: A | Discharge status: Home / Self Care | Payer: Medicaid Other | Source: Ambulatory Visit | Attending: Family Medicine | Admitting: Family Medicine

## 2019-08-01 DIAGNOSIS — I313 Pericardial effusion (noninflammatory): Secondary | ICD-10-CM | POA: Insufficient documentation

## 2019-08-01 DIAGNOSIS — I3139 Other pericardial effusion (noninflammatory): Secondary | ICD-10-CM

## 2019-08-01 DIAGNOSIS — R0902 Hypoxemia: Secondary | ICD-10-CM | POA: Insufficient documentation

## 2019-08-01 DIAGNOSIS — I340 Nonrheumatic mitral (valve) insufficiency: Secondary | ICD-10-CM

## 2019-08-01 DIAGNOSIS — R011 Cardiac murmur, unspecified: Secondary | ICD-10-CM | POA: Insufficient documentation

## 2019-08-01 NOTE — Nursing Note (Signed)
07/31/19 0800   Wound (Non-Surgical) Lower;Left;Anterior Leg   Initial Date of Wound Assessment/Initial Time of Wound Assessment: 07/24/19 0827   Wound Approximate Age at First Assessment (Weeks): 4 weeks  Primary Wound Type: Venous Ulcer  Orientation: Lower;Left;Anterior  Location: Leg   Wound Image    WOUND SITE CLOSURE None   Periwound Intact;Pink   Wound Bed 0%-25% epithelial;51%-75% fibrin   Wound Length (cm) 13.5 cm   Wound Width (cm) 4 cm   Wound Depth (cm) 0.1 cm   Wound Volume (cm^3) 5.4 cm^3   Wound Healing % 81   Wound Surface Area (cm^2) 54 cm^2   Wound Undermining No   Wound Tunneling  No   Wound Drainage Amount Small   Exudate/Drainage Serosanguineous   Wound Odor Absent   DRESSING TYPE   (2 layer, gelocast)   DRESSING STATUS Removed   Wound (Non-Surgical) Lower;Anterior;Right Leg   Initial Date of Wound Assessment/Initial Time of Wound Assessment: 07/24/19 0828   Wound Approximate Age at First Assessment (Weeks): 4 weeks  Orientation: Lower;Anterior;Right  Location: Leg   Wound Image    WOUND SITE CLOSURE None   Periwound Pink   Wound Bed 76%-100% fibrin   Wound Length (cm) 0.1 cm   Wound Width (cm) 0.1 cm   Wound Depth (cm) 0.1 cm   Wound Volume (cm^3) 0 cm^3   Wound Healing % 100   Wound Surface Area (cm^2) 0.01 cm^2   Wound Undermining No   Wound Tunneling  No   Wound Drainage Amount Small   Exudate/Drainage Serosanguineous   Wound Odor Absent   DRESSING TYPE   (2 layer, gelocast)   DRESSING STATUS Removed   Wound (Non-Surgical) Dorsal;Right Foot   Initial Date of Wound Assessment/Initial Time of Wound Assessment: 07/24/19 0829   Wound Approximate Age at First Assessment (Weeks): 4 weeks  Orientation: Dorsal;Right  Location: Foot   Wound Image    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   WOUND SITE CLOSURE None   Periwound Intact   Wound Bed 76%-100% fibrin   Wound Length (cm) 3.2 cm   Wound Width (cm) 3.2 cm   Wound Depth (cm) 0.2 cm   Wound Volume (cm^3) 2.05 cm^3   Wound  Healing % -71   Wound Surface Area (cm^2) 10.24 cm^2   Wound Undermining No   Wound Tunneling  No   Wound Drainage Amount Moderate   Exudate/Drainage Serosanguineous   Wound Odor Absent   DRESSING TYPE   (2 layer, gelocast)   DRESSING STATUS Removed   Wound (Non-Surgical) Left;Dorsal Foot   Initial Date of Wound Assessment/Initial Time of Wound Assessment: 07/24/19 0828   Wound Approximate Age at First Assessment (Weeks): 4 weeks  Primary Wound Type: Venous Ulcer  Orientation: Left;Dorsal  Location: Foot   Wound Image    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

## 2019-08-02 ENCOUNTER — Other Ambulatory Visit: Payer: Medicaid Other

## 2019-08-02 ENCOUNTER — Other Ambulatory Visit: Payer: Self-pay

## 2019-08-05 ENCOUNTER — Other Ambulatory Visit: Payer: Self-pay

## 2019-08-06 NOTE — Home Health (Signed)
Pt's husband sitting out side smoking when nurse arrived.  Pt sitting at table upon RN arrival.  No visible evidence of smoking in home nor any open flame.  Pt is wearing her O2 via nasal cannula at 3L.  Pt states she is doing better.  She states the edema has decreased in her legs and the compression wraps have really helped.  Pt reports pain at 3/10 at this time.  Assessment completed, VSS.  Education provided on oxygen use and Risk manager.  Reinforced no smoking or open flames near O2.  BLE have nonremovable compression wraps in place.  Pt reports she is now going to wound center weekly for compression wrap changes.  Pt reports her legs are healing and her pain is more managable now.  Pt denies checking her BS, stating she has had diabetes so long she can feel when her sugar is high and low.  Educated on diabetes and importance of monitor BS, especially for wound management.  Pt verbalizes understanding.  Pt denies further need for [facility] services at this time.  Discharge instructions given and paper signed.

## 2019-08-07 ENCOUNTER — Other Ambulatory Visit: Payer: Self-pay

## 2019-08-07 ENCOUNTER — Ambulatory Visit: Payer: Medicaid Other | Attending: Surgery | Admitting: Surgery

## 2019-08-07 ENCOUNTER — Encounter (HOSPITAL_COMMUNITY): Payer: Self-pay | Admitting: Surgery

## 2019-08-07 DIAGNOSIS — L97518 Non-pressure chronic ulcer of other part of right foot with other specified severity: Secondary | ICD-10-CM | POA: Insufficient documentation

## 2019-08-07 DIAGNOSIS — L97512 Non-pressure chronic ulcer of other part of right foot with fat layer exposed: Secondary | ICD-10-CM

## 2019-08-07 DIAGNOSIS — Z794 Long term (current) use of insulin: Secondary | ICD-10-CM | POA: Insufficient documentation

## 2019-08-07 DIAGNOSIS — I83025 Varicose veins of left lower extremity with ulcer other part of foot: Secondary | ICD-10-CM | POA: Insufficient documentation

## 2019-08-07 DIAGNOSIS — E11621 Type 2 diabetes mellitus with foot ulcer: Secondary | ICD-10-CM | POA: Insufficient documentation

## 2019-08-07 DIAGNOSIS — I83019 Varicose veins of right lower extremity with ulcer of unspecified site: Secondary | ICD-10-CM | POA: Insufficient documentation

## 2019-08-07 DIAGNOSIS — L97522 Non-pressure chronic ulcer of other part of left foot with fat layer exposed: Secondary | ICD-10-CM

## 2019-08-07 DIAGNOSIS — I83029 Varicose veins of left lower extremity with ulcer of unspecified site: Secondary | ICD-10-CM | POA: Insufficient documentation

## 2019-08-07 DIAGNOSIS — L97929 Non-pressure chronic ulcer of unspecified part of left lower leg with unspecified severity: Secondary | ICD-10-CM | POA: Insufficient documentation

## 2019-08-07 DIAGNOSIS — F17210 Nicotine dependence, cigarettes, uncomplicated: Secondary | ICD-10-CM | POA: Insufficient documentation

## 2019-08-07 DIAGNOSIS — L97919 Non-pressure chronic ulcer of unspecified part of right lower leg with unspecified severity: Secondary | ICD-10-CM | POA: Insufficient documentation

## 2019-08-07 DIAGNOSIS — I872 Venous insufficiency (chronic) (peripheral): Secondary | ICD-10-CM | POA: Insufficient documentation

## 2019-08-07 DIAGNOSIS — L97529 Non-pressure chronic ulcer of other part of left foot with unspecified severity: Secondary | ICD-10-CM | POA: Insufficient documentation

## 2019-08-07 LAB — POC FINGERSTICK GLUCOSE - BMC/JMC (RESULTS): GLUCOSE, POC: 169 mg/dl — ABNORMAL HIGH (ref 60–100)

## 2019-08-07 NOTE — Wound Therapy (Signed)
[facility] [place], [place] [address]    Wound Care Application of Multi-layer Compression Wrap      Procedure date/time: 08/07/2019/ 5790    Procedure: Application of Two-layer compression wrap  Description:    Compression of  both legs      An order was received.   The wound cleansed prior to application of  Two-layer compression wrap.  With the foot in dorsiflexed position the bandages were applied following the instructions of the manufacturer. Patient tolerated procedure without complications or complaints of pain. Patient provided with Patient Education. Reinforced need for patient to continue to observe toes for discoloration or swelling and when to contact physician should the need arise.

## 2019-08-07 NOTE — Patient Instructions (Addendum)
Order Questions    Question Answer Comment   Cleanse wound with SOAP AND WATER    Apply to wound  gelocast   Type of Compression Wrap 2 LAYER    Apply wrap to BILATERAL LEGS    Change Dressing Other (Specify in Comments) weekly         IF YOU ARE EXPERIENCING ANY OF THE FOLLOWING SYMPTOMS, PLEASE CALL us BEFORE YOUR APPOINTMENT, AS YOU MAY NEED TO BE RESCHEDULED.     *FEVER   *COUGH   *FATIGUE/MUSCLE ACHES   *HEADACHE   *LOSS OF TASTE OR SMELL   *SORE THROAT   *CONGESTION OR RUNNY NOSE   *NAUSEA OR VOMITING   *DIARRHEA    THANK YOU IN ADVANCE FOR HELPING TO KEEP OTHER PATIENTS AND STAFF SAFE.Smoking Cessation resources are available by calling 1-800-QUIT-NOW [phone])    Thank you for choosing [facility] for your medical care.  We hope you were satisfied with the care you received.  Please contact the Patient Advocate at [phone] if you have any questions or concerns related to your care.    If you experience a life threatening emergency, call 911 immediately or proceed to the closet ER.     If someone you know is dealing with an addiction issue and seeking help call the [place] at [phone]).      If you need to schedule an appointment such as an ultrasound or an MRI or other advanced imaging, please call [facility] scheduling at [phone].    Please remember to take ALL your belongings with you when you leave your appointment.      To assist [place] in providing quality care, you may receive a patient satisfaction survey in the mail.  Please take a few minutes to complete the survey, as we value your feedback and look for opportunities to serve you better. Tip for Including Protein in Your Diet    Hard or semi soft cheese (e.g. Cheddar, Jack, brick) Melt on sandwiches, bread, muffins, tortillas, hamburgers, hot dogs, other meats or fish, vegetables, eggs and desserts such as stewed fruit or pies.    Grate and add to soups, sauces, casseroles, vegetable dishes,  potatoes, rice noodles or meat loaf    Serve as a snack with crackers or bagels.   Cottage Cheese/Ricotta Cheese Mix with or use to stuff fruits and vegetables    Add to casseroles, spaghetti, noodles or egg dishes such as omelets, scrambled eggs and souffles.    Use in gelatin, pudding type desserts, cheesecake and pancake or waffle batter    Use to stuff crepes, pasta shells or manicotti    Puree and use as a substitute for sour cream   Milk Use in beverages and in cooking    Use in preparing foods , such as hot cereal, soups, cocoa or pudding    Add cream sauces to vegetable and other dishes    Use evaporated milk, evaporated skim milk or sweetened condensed milk instead of milk or water in recipes   Non-fat Dry Milk Add 1/3 cup nonfat dry powered milk to each cup of regular milk for "double strength" milk.    Add to yogurt and milk drinks such as pasteurized eggnog and milkshakes      Add to scrambled eggs and mashed potatoes    Use in casseroles, meat loaf, hot cereal, breads, muffins, sauces, cream soups, puddings and custards and other milk based desserts.    Caution:  Nonfat dry  milk may contain bacteria.  Add nonfat dry milk to a small amount of boiling water before adding to beverages or food that will not be cooked prior to use.  These mixed beverages and food items need to be refrigerated promptly and used within 24 hours of the mixing time.   Commercial Protein Supplements Use nutrition supplements such as instant breakfast, Sustacal , Ensure , Medtronic , [employer] , [employer]  or [employer] .  These may be purchased in grocery stores or pharmacy departments.    Use instant breakfast powder in milk drinks and desserts such as pudding    Mix with ice cream, milk and fruit or flavorings for a high-protein milk shake    Add powdered protein powder, such as ProCal Supplement , Promod , Pro Source  to foods such as soups, custards,puddings or orange juice or lemonade.   Ice Cream, Yogurt,  and Frozen Yogurt Add to carbonated beverage such as ginger ale    Add to milk drinks such a milkshakes    Add to cereals, fruits, gelatin desserts and pies    Blend or whip wit soft or cooked fruits    Sandwich ice cream or frozen yogurt between enriched cake slices, cookies or graham crackers    Use seasoned yogurt as a dip for fruits, vegetables or chips    Use yogurt in place of sour cream in casseroles   Eggs, Egg Whites and Egg Yolks Add chopped , hard cooked eggs to salads and dressings, vegetables, casseroles and creamed meats    Beat eggs into mashed potatoes, vegetable purees and sauces    Add extra whites to quiches, scrambled eggs, custards, puddings and pancake and [place] toast batter,    Make a rich custard with egg yolks, double strength milk and sugar.    Add extra hard-cooked yolks to deviled egg filling and sandwich spreads    Caution:  Do not serve raw or undercooked egg products die to risk of salmonella bacteria causing illness.    Note:  Use pasteurized liquid eggs if the food will not be thoroughly cooked,  Pasteurized liquid eggs are available in the frozen food section of grocery stores   Nuts. Seeds and Wheat Germ Add to casseroles, breads, muffins, pancakes, cookies and waffles.    Sprinkle on fruit, cereal, ice cream, yogurt, vegetables, salads and toast as a crunchy topping.    Use in place of breadcrumbs    Blend with parsley or spinach, herbs and cream for a noodle, pasta or vegetable sauce    Roll banana in chopped nuts   Peanut buter Spread on sandwiches, toast, muffins, crackers, waffles, pancakes or fruit slices.    Use as a dip for raw vegetables, such as carrots, cauliflower and celery    Blend with milk drinks and beverages    Swirl through soft ice cream and yogurt    Spread on a banana and then roll banana in crushed cereal or chopped nuts.   Meat and Fish Add chopped cooked meat or fish to vegetables, salads,  casseroles, soups, sauces and biscuit dough.    Use in omelets,  souffles, quiches and sandwich fillings.    Add chicken and [place] to [employer] in piecrust or biscuit dough as turnovers    Add to stuffed baked potatoes    Add pureed meat to soups    Eat calves or chicken liver or heart, which are especially good sources of protein, vitamins and minerals  Beans Cook and use dried peas, beans and bean curd (tofu) in soups or add to casseroles, pastas and grain dishes that also contain cheese or meat.    Mash with cheese and milk    Use tofu to make smoothies.

## 2019-08-07 NOTE — Nursing Note (Signed)
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   WOUND SITE CLOSURE None   Periwound Pink   Wound Bed 76%-100% fibrin   Wound Length (cm) 0.8 cm   Wound Width (cm) 0.7 cm   Wound Depth (cm) 0.1 cm   Wound Volume (cm^3) 0.06 cm^3   Wound Healing % 90   Wound Surface Area (cm^2) 0.56 cm^2   Wound Undermining No   Wound Tunneling  No   Wound Drainage Amount Small   Exudate/Drainage Serosanguineous   Wound Odor Absent   DRESSING TYPE Calamine/Zinc Oxide Wrap  (2 layer)   DRESSING STATUS Removed   Wound (Non-Surgical) Dorsal;Right Foot   Initial Date of Wound Assessment/Initial Time of Wound Assessment: 07/24/19 0829   Wound Approximate Age at First Assessment (Weeks): 4 weeks  Orientation: Dorsal;Right  Location: Foot   Wound Image    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   WOUND SITE CLOSURE None   Periwound Intact   Wound Bed 76%-100% fibrin   Wound Length (cm) 3 cm   Wound Width (cm) 1.7 cm   Wound Depth (cm) 0.1 cm   Wound Volume (cm^3) 0.51 cm^3   Wound Healing % 58   Wound Surface Area (cm^2) 5.1 cm^2   Wound Undermining No   Wound Tunneling  No   Wound Drainage Amount Moderate   Exudate/Drainage Serosanguineous   Wound Odor Absent   DRESSING TYPE Calamine/Zinc Oxide Wrap  (2 layer)   DRESSING STATUS Removed   Wound (Non-Surgical) Lower;Left;Anterior Leg   Initial Date of Wound Assessment/Initial Time of Wound Assessment: 07/24/19 0827   Wound Approximate Age at First Assessment (Weeks): 4 weeks  Primary Wound Type: Venous Ulcer  Orientation: Lower;Left;Anterior  Location: Leg   Wound Image    WOUND SITE CLOSURE None   Periwound Intact;Pink   Wound Bed 76%-100% epithelial   Wound Length (cm) 0 cm      Wound Width (cm) 0 cm   Wound Depth (cm) 0 cm   Wound Volume (cm^3) 0 cm^3   Wound Healing % 100   Wound Surface Area (cm^2) 0 cm^2   Wound (Non-Surgical) Lower;Anterior;Right Leg   Initial Date of Wound Assessment/Initial Time of Wound Assessment: 07/24/19 0828   Wound Approximate Age at First Assessment (Weeks): 4 weeks  Orientation: Lower;Anterior;Right  Location: Leg   Wound Image    WOUND SITE CLOSURE None   Periwound Pink   Wound Bed 76%-100% epithelial   Wound Length (cm) 0 cm   Wound Width (cm) 0 cm   Wound Depth (cm) 0 cm   Wound Volume (cm^3) 0 cm^3   Wound Healing % 100   Wound Surface Area (cm^2) 0 cm^2

## 2019-08-07 NOTE — Progress Notes (Signed)
[facility] [place], [place] [address]    Wound Care Progress Note      Date of Service: 08/07/2019  [name], 64 y.o., White female  Date of Birth:  1955/05/12  MRN: [id number]  PCP: Hubbard Robinson, DO  Information Obtained from: Patient.    Reason for visit:   Chief Complaint   Patient presents with    Wound       HPI:    Past medical history, past surgical history, social history and family history have been reviewed. she complains of an open wound. The wound has been present since last Spring. The wound is painful. No associated fever/chills. No erythema No foul smelling drainage. Peri wound wnl.    Social History     Tobacco Use   Smoking Status Current Every Day Smoker    Packs/day: 1.00    Years: 20.00    Pack years: 20.00    Types: Cigarettes   Smokeless Tobacco Never Used       Review of Systems  Review of Systems   Constitutional: Negative for activity change, appetite change, chills, diaphoresis, fatigue and fever.   HENT: Negative for congestion, dental problem, drooling, facial swelling, rhinorrhea, sore throat and trouble swallowing.    Eyes: Negative for pain, discharge and itching.   Respiratory: Negative for apnea, cough, choking, chest tightness, shortness of breath, wheezing and stridor.    Cardiovascular: Negative for chest pain, palpitations and leg swelling.   Gastrointestinal: Negative for abdominal distention, abdominal pain, anal bleeding, blood in stool, constipation, diarrhea, nausea, rectal pain and vomiting.   Endocrine: Negative for cold intolerance, heat intolerance and polydipsia.   Genitourinary: Negative for difficulty urinating, dysuria, enuresis, flank pain, frequency, hematuria and pelvic pain.   Musculoskeletal: Negative for arthralgias, back pain, joint swelling, myalgias, neck pain and neck stiffness.   Skin: Positive for color change, pallor, rash and wound.   Allergic/Immunologic: Negative for  immunocompromised state.   Neurological: Negative for dizziness, seizures, syncope, weakness, light-headedness, numbness and headaches.   Hematological: Negative for adenopathy. Does not bruise/bleed easily.   Psychiatric/Behavioral: Negative for agitation, confusion, hallucinations and suicidal ideas. The patient is not nervous/anxious.      Problem List:  Patient Active Problem List    Diagnosis    COPD (chronic obstructive pulmonary disease) (CMS HCC)    Hypokalemia    Atrial fibrillation (CMS HCC)    Sepsis due to cellulitis (CMS HCC)    COPD exacerbation (CMS HCC)    Atrial fibrillation with rapid ventricular response (CMS HCC)    Acute hypoxemic respiratory failure (CMS HCC)    Sepsis    CAP (community acquired pneumonia)    Acute exacerbation of chronic obstructive pulmonary disease (COPD) (CMS [place])    COPD with acute exacerbation (CMS HCC)    Tobacco use disorder    Acute respiratory failure (CMS HCC)    Non-compliance    Hypertension due to endocrine disorder    Diabetes (CMS HCC)    Chronic hypoxemic respiratory failure (CMS HCC)    Obesity (BMI 35.0-39.9 without comorbidity)       History and vital signs as completed by clinical staff and reviewed by Flora Lipps, MD  Past Medical History  Current Outpatient Medications   Medication Sig    ADVAIR DISKUS 500-50 mcg/dose Inhalation Disk with Device oral diskus inhaler INHALE 1 DOSE BY MOUTH TWICE DAILY (Patient taking differently: Take 1 INHALATION by  inhalation Twice daily INHALE 1 DOSE BY MOUTH TWICE DAILY)    albuterol (PROVENTIL) 2.5 mg/0.5 mL Inhalation Solution for Nebulization USE 1 VIAL IN NEBULIZER EVERY 4 HOURS (Patient taking differently: 2.5 mg by Nebulization route Every 4 hours as needed  for bronchospasm prevention)    albuterol sulfate (PROVENTIL HFA) 90 mcg/actuation Inhalation HFA Aerosol Inhaler Take 2 Puffs by inhalation Four times a day. Indications: chronic obstructive pulmonary disease    albuterol sulfate  (PROVENTIL) 2.5 mg /3 mL (0.083 %) Inhalation Solution for Nebulization USE 1 VIAL IN NEBULIZER 4 TIMES DAILY    apixaban (ELIQUIS) 5 mg Oral Tablet Take 1 Tablet (5 mg total) by mouth Twice daily    atorvastatin (LIPITOR) 40 mg Oral Tablet Take 1 tablet by mouth once daily (Patient taking differently: Take 40 mg by mouth Every evening )    ATROVENT HFA 17 mcg/actuation Inhalation HFA Aerosol Inhaler oral inhaler INHALE 2 PUFFS BY MOUTH 4 TIMES DAILY    furosemide (LASIX) 20 mg Oral Tablet Take 1 Tablet (20 mg total) by mouth Once a day for 30 days    insulin glargine (LANTUS SOLOSTAR U-100 INSULIN) 100 unit/mL Subcutaneous Insulin Pen 30 Units by Subcutaneous route Twice daily    ipratropium (ATROVENT) 0.02 % Inhalation Solution 2.5 mL (0.5 mg total) by Nebulization route ONCE EVERY 4 HOURS    lisinopriL (PRINIVIL) 5 mg Oral Tablet Take 1 tablet by mouth once daily (Patient taking differently: Take 5 mg by mouth Once a day Take 1 tablet by mouth once daily)    metoprolol succinate (TOPROL-XL) 50 mg Oral Tablet Sustained Release 24 hr Take 1 Tablet (50 mg total) by mouth Once a day for 30 days    oxygen (O2) Inhalation gas Take 3-4 L/min by inhalation continuous. concentrator and portable. May increase oxygen to 4 L NC with activity as needed for shortness of breath.   Indications: COPD with Chronic Bronchitis     Allergies   Allergen Reactions    Codeine Nausea/ Vomiting     Sick to stomach    Hydrocodone Nausea/ Vomiting    Metformin Diarrhea    Oxycodone Nausea/ Vomiting     Past Medical History:   Diagnosis Date    COPD (chronic obstructive pulmonary disease) (CMS HCC)     Degenerative joint disease involving multiple joints     Diabetes mellitus (CMS HCC)     HTN (hypertension)     Smoker     Supplemental oxygen dependent     3 lpm O2 via NC    Wears glasses          Past Surgical History:   Procedure Laterality Date    HX CARPAL TUNNEL RELEASE           Family Medical History:     Problem  Relation (Age of Onset)    COPD Mother    Coronary Artery Disease Father    Diabetes Mother, Sister, Brother, Paternal Grandmother            Social History     Socioeconomic History    Marital status: Married     Spouse name: Not on file    Number of children: Not on file    Years of education: Not on file    Highest education level: Not on file   Tobacco Use    Smoking status: Current Every Day Smoker     Packs/day: 1.00     Years: 20.00     Pack  years: 20.00     Types: Cigarettes    Smokeless tobacco: Never Used   Vaping Use    Vaping Use: Never used   Substance and Sexual Activity    Alcohol use: No    Drug use: No   Other Topics Concern     Social Determinants of Health     Financial Resource Strain:     Difficulty of Paying Living Expenses:    Food Insecurity:     Worried About Charity fundraiser in the Last Year:     Arboriculturist in the Last Year:    Transportation Needs:     Film/video editor (Medical):     Lack of Transportation (Non-Medical):    Physical Activity:     Days of Exercise per Week:     Minutes of Exercise per Session:    Stress:     Feeling of Stress :    Intimate Partner Violence:     Fear of Current or Ex-Partner:     Emotionally Abused:     Physically Abused:     Sexually Abused:        BP (!) 158/70    Pulse (!) 108    Temp 36.3 C (97.3 F)    Resp (!) 21    LMP  (LMP Unknown)    SpO2 93%         Physical Exam   Physical Exam  Constitutional:       General: She is not in acute distress.     Appearance: She is not diaphoretic.   HENT:      Head: Normocephalic and atraumatic.      Mouth/Throat:      Pharynx: No oropharyngeal exudate.   Eyes:      General: No scleral icterus.        Right eye: No discharge.         Left eye: No discharge.      Conjunctiva/sclera: Conjunctivae normal.      Pupils: Pupils are equal, round, and reactive to light.   Neck:      Thyroid: No thyromegaly.      Vascular: No JVD.   Cardiovascular:      Rate and Rhythm: Normal rate and  regular rhythm.      Heart sounds: Normal heart sounds. No murmur heard.   No friction rub. No gallop.    Pulmonary:      Effort: No respiratory distress.      Breath sounds: No wheezing or rales.   Chest:      Chest wall: No tenderness.   Abdominal:      General: There is no distension.      Palpations: There is no mass.      Tenderness: There is no abdominal tenderness. There is no guarding or rebound.   Musculoskeletal:         General: No tenderness or deformity. Normal range of motion.      Cervical back: Normal range of motion and neck supple.   Lymphadenopathy:      Cervical: No cervical adenopathy.   Skin:     Coloration: Skin is not pale.      Findings: No erythema or rash.   Neurological:      Mental Status: She is alert and oriented to person, place, and time.      Cranial Nerves: No cranial nerve deficit.      Motor: No abnormal muscle tone.  Coordination: Coordination normal.      Deep Tendon Reflexes: Reflexes are normal and symmetric. Reflexes normal.   Psychiatric:         Judgment: Judgment normal.         Examination  Vitals:  Temperature: 36.3 C (97.3 F) (08/07/19 0827)  Heart Rate: (!) 108 (08/07/19 0827)  Respiratory Rate: (!) 21 (08/07/19 0827)  BP (Non-Invasive): (!) 158/70 (08/07/19 0827)   /     Integumentary Assessment  Wound Assessment  Nursing Notes:   Laurence Aly, CASE MANAGER  08/07/19 0857  Signed     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   WOUND SITE CLOSURE None   Periwound Pink   Wound Bed 76%-100% fibrin   Wound Length (cm) 0.8 cm   Wound Width (cm) 0.7 cm   Wound Depth (cm) 0.1 cm   Wound Volume (cm^3) 0.06 cm^3   Wound Healing % 90   Wound Surface Area (cm^2) 0.56  cm^2   Wound Undermining No   Wound Tunneling  No   Wound Drainage Amount Small   Exudate/Drainage Serosanguineous   Wound Odor Absent   DRESSING TYPE Calamine/Zinc Oxide Wrap  (2 layer)   DRESSING STATUS Removed   Wound (Non-Surgical) Dorsal;Right Foot   Initial Date of Wound Assessment/Initial Time of Wound Assessment: 07/24/19 0829   Wound Approximate Age at First Assessment (Weeks): 4 weeks  Orientation: Dorsal;Right  Location: Foot   Wound Image    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   WOUND SITE CLOSURE None   Periwound Intact   Wound Bed 76%-100% fibrin   Wound Length (cm) 3 cm   Wound Width (cm) 1.7 cm   Wound Depth (cm) 0.1 cm   Wound Volume (cm^3) 0.51 cm^3   Wound Healing % 58   Wound Surface Area (cm^2) 5.1 cm^2   Wound Undermining No   Wound Tunneling  No   Wound Drainage Amount Moderate   Exudate/Drainage Serosanguineous   Wound Odor Absent   DRESSING TYPE Calamine/Zinc Oxide Wrap  (2 layer)   DRESSING STATUS Removed   Wound (Non-Surgical) Lower;Left;Anterior Leg   Initial Date of Wound Assessment/Initial Time of Wound Assessment: 07/24/19 0827   Wound Approximate Age at First Assessment (Weeks): 4 weeks  Primary Wound Type: Venous Ulcer  Orientation: Lower;Left;Anterior  Location: Leg   Wound Image    WOUND SITE CLOSURE None   Periwound Intact;Pink   Wound Bed 76%-100% epithelial   Wound Length (cm) 0 cm   Wound Width (cm) 0 cm   Wound Depth (cm) 0 cm   Wound Volume (cm^3) 0 cm^3   Wound Healing % 100   Wound Surface Area (cm^2) 0 cm^2   Wound (Non-Surgical) Lower;Anterior;Right Leg   Initial Date of Wound Assessment/Initial Time of Wound Assessment: 07/24/19 0828   Wound Approximate Age at First Assessment (Weeks): 4 weeks  Orientation: Lower;Anterior;Right  Location: Leg   Wound Image    WOUND SITE CLOSURE None   Periwound Pink   Wound Bed 76%-100% epithelial   Wound Length (cm) 0 cm   Wound Width (cm) 0 cm   Wound Depth (cm) 0 cm   Wound Volume (  cm^3) 0 cm^3   Wound Healing % 100    Wound Surface Area (cm^2) 0 cm^2       PROCEDURES  no Procedure Note    Infection status:   No signs/symptoms of infection  @ENCORDICORDWOUND @  No orders of the defined types were placed in this encounter.      Neuro/Vascular Assessment       Most Recent ABI/TBI      Office Visit from 07/31/2019 in [place] ABI  0.92 filed at... 07/31/2019 0832   Lt ABI  0.94 filed at... 07/31/2019 0832   Rt TBI  0.42 filed at... 07/31/2019 0832   Lt TBI  1.04 filed at... 07/31/2019 0832        @ENCORDVASCWOUND @    Nutritional Assessment:    Recent Labs     07/11/19  1500   ALBUMIN 2.4*     Nutritional Status discussed with patient.    Diabetes Assessment:  Lab Results   Component Value Date    GLUCPOCT 254 (H) 10/11/2012    GLUIP 169 (H) 08/07/2019   ]  @RECENTA1C (52W)@  NA  Management of daily FSBS deferred to PCP.      Drugs affecting wound  None    Social support:  None    ASSESSMENT/PLAN    Problem List Items Addressed This Visit     None      Visit Diagnoses     Venous insufficiency        Relevant Orders    DRESSING CHANGE    POCT BLOOD GLUCOSE/FINGERSTICK (AMB)    FOLLOW-UP: WOUND CARE - RETURN VISIT - [facility] - [place], [place]    DISCHARGE INSTRUCTION - WOUND CARE          Offloading status/Pressure Reduction:    cane  I personally reviewed the critical need for wound offloading.     Edema control:    Compression wraps 20-30 mmHg    Referrals:    Nutritionist No. If Albumin < 3.5.  Diabetic Educator No. If A1C > 7.  Endocrinology No. If A1C > 10.  PT/OT/Seating Clinic No  IR/Vascular No     HBO utility:   does not meet criteria    F/U:  Follow up with physician in 1 week. Apply as above to the wound. Patient is not a candidate for skin substitute.       Flora Lipps, MD  08/07/2019, 09:12

## 2019-08-08 ENCOUNTER — Ambulatory Visit
Admission: RE | Admit: 2019-08-08 | Discharge: 2019-08-08 | Disposition: A | Discharge status: Home / Self Care | Payer: Medicaid Other | Source: Ambulatory Visit | Attending: Surgery | Admitting: Surgery

## 2019-08-08 DIAGNOSIS — I872 Venous insufficiency (chronic) (peripheral): Secondary | ICD-10-CM | POA: Insufficient documentation

## 2019-08-09 ENCOUNTER — Other Ambulatory Visit: Payer: Self-pay

## 2019-08-12 ENCOUNTER — Other Ambulatory Visit (HOSPITAL_BASED_OUTPATIENT_CLINIC_OR_DEPARTMENT_OTHER): Payer: Self-pay | Admitting: Family Medicine

## 2019-08-12 MED ORDER — ALBUTEROL SULFATE 2.5 MG/3 ML (0.083 %) SOLUTION FOR NEBULIZATION
INHALATION_SOLUTION | RESPIRATORY_TRACT | 2 refills | Status: DC
Start: 2019-08-12 — End: 2020-01-21

## 2019-08-12 NOTE — Telephone Encounter (Signed)
Pt called and requested a refill of the albulterol inhalation solution (2.5 mg/ 0.5 m) and the albuterol sulfate inhalation solution (2.5 mg/ 73mL) to go to [facility] on [address].        Amber Truiett  08/12/2019, 13:31

## 2019-08-12 NOTE — Telephone Encounter (Signed)
Last office visit with Dr. Sabra Heck 07.21.2021.     [place], [place]  08/12/2019, 14:03

## 2019-08-13 ENCOUNTER — Other Ambulatory Visit: Payer: Self-pay

## 2019-08-14 ENCOUNTER — Ambulatory Visit: Payer: Medicaid Other | Attending: Surgery | Admitting: Surgery

## 2019-08-14 ENCOUNTER — Other Ambulatory Visit: Payer: Self-pay

## 2019-08-14 ENCOUNTER — Encounter (HOSPITAL_COMMUNITY): Payer: Self-pay | Admitting: Surgery

## 2019-08-14 DIAGNOSIS — I872 Venous insufficiency (chronic) (peripheral): Secondary | ICD-10-CM | POA: Insufficient documentation

## 2019-08-14 DIAGNOSIS — I83025 Varicose veins of left lower extremity with ulcer other part of foot: Secondary | ICD-10-CM | POA: Insufficient documentation

## 2019-08-14 DIAGNOSIS — L97518 Non-pressure chronic ulcer of other part of right foot with other specified severity: Secondary | ICD-10-CM | POA: Insufficient documentation

## 2019-08-14 DIAGNOSIS — L97519 Non-pressure chronic ulcer of other part of right foot with unspecified severity: Secondary | ICD-10-CM

## 2019-08-14 DIAGNOSIS — F17210 Nicotine dependence, cigarettes, uncomplicated: Secondary | ICD-10-CM | POA: Insufficient documentation

## 2019-08-14 DIAGNOSIS — L97529 Non-pressure chronic ulcer of other part of left foot with unspecified severity: Secondary | ICD-10-CM | POA: Insufficient documentation

## 2019-08-14 DIAGNOSIS — E11621 Type 2 diabetes mellitus with foot ulcer: Secondary | ICD-10-CM | POA: Insufficient documentation

## 2019-08-14 LAB — POC FINGERSTICK GLUCOSE - BMC/JMC (RESULTS): GLUCOSE, POC: 172 mg/dL — ABNORMAL HIGH (ref 60–100)

## 2019-08-14 NOTE — Wound Therapy (Signed)
[facility] [place], [place] [address]    Wound Care Application of Multi-layer Compression Wrap      Procedure date/time: 08/14/2019/ 1014    Procedure: Application of Two-layer compression wrap  Description:    Compression of  both legs      An order was received.   The wound cleansed prior to application of  Two-layer compression wrap.  With the foot in dorsiflexed position the bandages were applied following the instructions of the manufacturer. Patient tolerated procedure without complications or complaints of pain. Patient provided with Patient Education. Reinforced need for patient to continue to observe toes for discoloration or swelling and when to contact physician should the need arise.

## 2019-08-14 NOTE — Progress Notes (Signed)
[facility] [place], [place] [address]    Wound Care Progress Note      Date of Service: 08/14/2019  [name], 64 y.o., White female  Date of Birth:  [birth year]  MRN: [id number]  PCP: Hubbard Robinson, DO  Information Obtained from: Patient.    Reason for visit:   Chief Complaint   Patient presents with   . Wound       HPI:    Past medical history, past surgical history, social history and family history have been reviewed. she complains of an open wound. The wound has been present since last spring. The wound is painful. No associated fever/chills. No erythema No foul smelling drainage. Peri wound wnl.    Social History     Tobacco Use   Smoking Status Current Every Day Smoker   . Packs/day: 1.00   . Years: 20.00   . Pack years: 20.00   . Types: Cigarettes   Smokeless Tobacco Never Used       Review of Systems  Review of Systems   Constitutional: Negative for activity change, appetite change, chills, diaphoresis, fatigue and fever.   HENT: Negative for congestion, dental problem, drooling, facial swelling, rhinorrhea, sore throat and trouble swallowing.    Eyes: Negative for pain, discharge and itching.   Respiratory: Negative for apnea, cough, choking, chest tightness, shortness of breath, wheezing and stridor.    Cardiovascular: Negative for chest pain, palpitations and leg swelling.   Gastrointestinal: Negative for abdominal distention, abdominal pain, anal bleeding, blood in stool, constipation, diarrhea, nausea, rectal pain and vomiting.   Endocrine: Negative for cold intolerance, heat intolerance and polydipsia.   Genitourinary: Negative for difficulty urinating, dysuria, enuresis, flank pain, frequency, hematuria and pelvic pain.   Musculoskeletal: Negative for arthralgias, back pain, joint swelling, myalgias, neck pain and neck stiffness.   Skin: Positive for color change, pallor, rash and wound.   Allergic/Immunologic: Negative for  immunocompromised state.   Neurological: Negative for dizziness, seizures, syncope, weakness, light-headedness, numbness and headaches.   Hematological: Negative for adenopathy. Does not bruise/bleed easily.   Psychiatric/Behavioral: Negative for agitation, confusion, hallucinations and suicidal ideas. The patient is not nervous/anxious.      Problem List:  Patient Active Problem List    Diagnosis   . COPD (chronic obstructive pulmonary disease) (CMS HCC)   . Hypokalemia   . Atrial fibrillation (CMS HCC)   . Sepsis due to cellulitis (CMS [place])   . COPD exacerbation (CMS HCC)   . Atrial fibrillation with rapid ventricular response (CMS HCC)   . Acute hypoxemic respiratory failure (CMS HCC)   . Sepsis   . CAP (community acquired pneumonia)   . Acute exacerbation of chronic obstructive pulmonary disease (COPD) (CMS HCC)   . COPD with acute exacerbation (CMS HCC)   . Tobacco use disorder   . Acute respiratory failure (CMS HCC)   . Non-compliance   . Hypertension due to endocrine disorder   . Diabetes (CMS [place])   . Chronic hypoxemic respiratory failure (CMS HCC)   . Obesity (BMI 35.0-39.9 without comorbidity)       History and vital signs as completed by clinical staff and reviewed by Flora Lipps, MD  Past Medical History  Current Outpatient Medications   Medication Sig   . ADVAIR DISKUS 500-50 mcg/dose Inhalation Disk with Device oral diskus inhaler INHALE 1 DOSE BY MOUTH TWICE DAILY (Patient taking differently: Take 1 INHALATION by  inhalation Twice daily INHALE 1 DOSE BY MOUTH TWICE DAILY)   . albuterol (PROVENTIL) 2.5 mg/0.5 mL Inhalation Solution for Nebulization USE 1 VIAL IN NEBULIZER EVERY 4 HOURS (Patient taking differently: 2.5 mg by Nebulization route Every 4 hours as needed  for bronchospasm prevention)   . albuterol sulfate (PROVENTIL HFA) 90 mcg/actuation Inhalation HFA Aerosol Inhaler Take 2 Puffs by inhalation Four times a day. Indications: chronic obstructive pulmonary disease   . albuterol sulfate  (PROVENTIL) 2.5 mg /3 mL (0.083 %) Inhalation Solution for Nebulization USE 1 VIAL IN NEBULIZER 4 TIMES DAILY   . apixaban (ELIQUIS) 5 mg Oral Tablet Take 1 Tablet (5 mg total) by mouth Twice daily   . atorvastatin (LIPITOR) 40 mg Oral Tablet Take 1 tablet by mouth once daily (Patient taking differently: Take 40 mg by mouth Every evening )   . ATROVENT HFA 17 mcg/actuation Inhalation HFA Aerosol Inhaler oral inhaler INHALE 2 PUFFS BY MOUTH 4 TIMES DAILY   . insulin glargine (LANTUS SOLOSTAR U-100 INSULIN) 100 unit/mL Subcutaneous Insulin Pen 30 Units by Subcutaneous route Twice daily   . ipratropium (ATROVENT) 0.02 % Inhalation Solution 2.5 mL (0.5 mg total) by Nebulization route ONCE EVERY 4 HOURS   . lisinopriL (PRINIVIL) 5 mg Oral Tablet Take 1 tablet by mouth once daily (Patient taking differently: Take 5 mg by mouth Once a day Take 1 tablet by mouth once daily)   . metoprolol succinate (TOPROL-XL) 50 mg Oral Tablet Sustained Release 24 hr Take 1 Tablet (50 mg total) by mouth Once a day for 30 days   . oxygen (O2) Inhalation gas Take 3-4 L/min by inhalation continuous. concentrator and portable. May increase oxygen to 4 L NC with activity as needed for shortness of breath.   Indications: COPD with Chronic Bronchitis     Allergies   Allergen Reactions   . Codeine Nausea/ Vomiting     Sick to stomach   . Hydrocodone Nausea/ Vomiting   . Metformin Diarrhea   . Oxycodone Nausea/ Vomiting     Past Medical History:   Diagnosis Date   . COPD (chronic obstructive pulmonary disease) (CMS HCC)    . Degenerative joint disease involving multiple joints    . Diabetes mellitus (CMS [place])    . HTN (hypertension)    . Smoker    . 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 Wears glasses          Past Surgical History:   Procedure Laterality Date   . HX CARPAL TUNNEL RELEASE           Family Medical History:     Problem Relation (Age of Onset)    COPD Mother    Coronary Artery Disease Father    Diabetes Mother, Sister,  Brother, Paternal Grandmother            Social History     Socioeconomic History   . Marital status: Married     Spouse name: Not on file   . Number of children: Not on file   . Years of education: Not on file   . Highest education level: Not on file   Tobacco Use   . Smoking status: Current Every Day Smoker     Packs/day: 1.00     Years: 20.00     Pack years: 20.00     Types: Cigarettes   . Smokeless tobacco: Never Used   Vaping Use   . Vaping  Use: Never used   Substance and Sexual Activity   . Alcohol use: No   . Drug use: No   Other Topics Concern     Social Determinants of Health     Financial Resource Strain:    . Difficulty of Paying Living Expenses:    Food Insecurity:    . Worried About Charity fundraiser in the Last Year:    . Arboriculturist in the Last Year:    Transportation Needs:    . Film/video editor (Medical):    Marland Kitchen Lack of Transportation (Non-Medical):    Physical Activity:    . Days of Exercise per Week:    . Minutes of Exercise per Session:    Stress:    . Feeling of Stress :    Intimate Partner Violence:    . Fear of Current or Ex-Partner:    . Emotionally Abused:    Marland Kitchen Physically Abused:    . Sexually Abused:        BP (!) 146/69   Pulse (!) 101   Temp 36.4 C (97.5 F)   Resp 20   LMP  (LMP Unknown)         Physical Exam   Physical Exam  Constitutional:       General: She is not in acute distress.     Appearance: She is not diaphoretic.   HENT:      Head: Normocephalic and atraumatic.      Mouth/Throat:      Pharynx: No oropharyngeal exudate.   Eyes:      General: No scleral icterus.        Right eye: No discharge.         Left eye: No discharge.      Conjunctiva/sclera: Conjunctivae normal.      Pupils: Pupils are equal, round, and reactive to light.   Neck:      Thyroid: No thyromegaly.      Vascular: No JVD.   Cardiovascular:      Rate and Rhythm: Normal rate and regular rhythm.      Heart sounds: Normal heart sounds. No murmur heard.   No friction rub. No gallop.    Pulmonary:       Effort: No respiratory distress.      Breath sounds: No wheezing or rales.   Chest:      Chest wall: No tenderness.   Abdominal:      General: There is no distension.      Palpations: There is no mass.      Tenderness: There is no abdominal tenderness. There is no guarding or rebound.   Musculoskeletal:         General: No tenderness or deformity. Normal range of motion.      Cervical back: Normal range of motion and neck supple.   Lymphadenopathy:      Cervical: No cervical adenopathy.   Skin:     Coloration: Skin is not pale.      Findings: No erythema or rash.   Neurological:      Mental Status: She is alert and oriented to person, place, and time.      Cranial Nerves: No cranial nerve deficit.      Motor: No abnormal muscle tone.      Coordination: Coordination normal.      Deep Tendon Reflexes: Reflexes are normal and symmetric. Reflexes normal.   Psychiatric:         Judgment:  Judgment normal.         Examination  Vitals:  Temperature: 36.4 C (97.5 F) (08/14/19 0940)  Heart Rate: (!) 101 (08/14/19 0940)  Respiratory Rate: 20 (08/14/19 0940)  BP (Non-Invasive): (!) 146/69 (08/14/19 0940)   /     Integumentary Assessment  Wound Assessment  Nursing Notes:   Laurence Aly, CASE MANAGER  08/14/19 1010  Signed     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   WOUND SITE CLOSURE None   Periwound Pink   Wound Bed 76%-100% fibrin   Wound Length (cm) 0.1 cm   Wound Width (cm) 0.1 cm   Wound Depth (cm) 0.1 cm   Wound Volume (cm^3) 0 cm^3   Wound Healing % 100   Wound Surface Area (cm^2) 0.01 cm^2   Wound Undermining No   Wound Tunneling  No   Wound Drainage Amount Small   Exudate/Drainage Serosanguineous   Wound  Odor Absent   DRESSING TYPE Calamine/Zinc Oxide Wrap  (2 layer)   DRESSING STATUS Removed   Wound (Non-Surgical) Dorsal;Right Foot   Initial Date of Wound Assessment/Initial Time of Wound Assessment: 07/24/19 0829   Wound Approximate Age at First Assessment (Weeks): 4 weeks  Orientation: Dorsal;Right  Location: Foot   Wound Image    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       PROCEDURES   Procedure Note    Infection status:   No signs/symptoms of infection  @ENCORDICORDWOUND @  No orders of the defined types were placed in this encounter.      Neuro/Vascular Assessment       Most Recent ABI/TBI      Office Visit from 07/31/2019 in [facility] ABI  0.92 filed at... 07/31/2019 0832   Lt ABI  0.94 filed at... 07/31/2019 0832   Rt TBI  0.42 filed at... 07/31/2019 0832   Lt TBI  1.04 filed at... 07/31/2019 0832        @ENCORDVASCWOUND @    Nutritional Assessment:    Recent Labs     07/11/19  1500   ALBUMIN 2.4*     Nutritional Status discussed with patient.    Diabetes Assessment:  Lab Results   Component Value Date    GLUCPOCT 254 (H) 10/11/2012    GLUIP 172 (H) 08/14/2019   ]  @RECENTA1C (52W)@  NA  Management of daily FSBS deferred to PCP.      Drugs affecting wound  None    Social support:  None    ASSESSMENT/PLAN    Problem List Items Addressed This Visit  None      Visit Diagnoses     Venous insufficiency        Relevant Orders    DRESSING CHANGE    POCT BLOOD GLUCOSE/FINGERSTICK (AMB)    FOLLOW-UP: WOUND CARE - RETURN VISIT - [place]  [facility] - [place], [place] INSTRUCTION - WOUND CARE          Offloading status/Pressure Reduction:    walker  I personally reviewed the critical need for wound offloading.     Edema control:    Compression wraps 20-30 mmHg    Referrals:    Nutritionist No. If Albumin < 3.5.  Diabetic Educator No. If A1C > 7.  Endocrinology No. If A1C > 10.  PT/OT/Seating Clinic No  IR/Vascular No     HBO utility:   does not meet criteria    F/U:  Follow up with physician in 1 week. Apply as above to the wound. Patient is not a candidate for skin substitute.       Flora Lipps, MD  08/14/2019, 10:25

## 2019-08-14 NOTE — Nursing Note (Signed)
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   WOUND SITE CLOSURE None   Periwound Pink   Wound Bed 76%-100% fibrin   Wound Length (cm) 0.1 cm   Wound Width (cm) 0.1 cm   Wound Depth (cm) 0.1 cm   Wound Volume (cm^3) 0 cm^3   Wound Healing % 100   Wound Surface Area (cm^2) 0.01 cm^2   Wound Undermining No   Wound Tunneling  No   Wound Drainage Amount Small   Exudate/Drainage Serosanguineous   Wound Odor Absent   DRESSING TYPE Calamine/Zinc Oxide Wrap  (2 layer)   DRESSING STATUS Removed   Wound (Non-Surgical) Dorsal;Right Foot   Initial Date of Wound Assessment/Initial Time of Wound Assessment: 07/24/19 0829   Wound Approximate Age at First Assessment (Weeks): 4 weeks  Orientation: Dorsal;Right  Location: Foot   Wound Image    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

## 2019-08-14 NOTE — Patient Instructions (Addendum)
Order Questions    Question Answer Comment   Cleanse wound with SOAP AND WATER    Apply to wound  gelocast, silicone   Type of Compression Wrap 2 LAYER    Apply wrap to BILATERAL LEGS    Change Dressing Other (Specify in Comments) weekly         IF YOU ARE EXPERIENCING ANY OF THE FOLLOWING SYMPTOMS, PLEASE CALL us BEFORE YOUR APPOINTMENT, AS YOU MAY NEED TO BE RESCHEDULED.     *FEVER   *COUGH   *FATIGUE/MUSCLE ACHES   *HEADACHE   *LOSS OF TASTE OR SMELL   *SORE THROAT   *CONGESTION OR RUNNY NOSE   *NAUSEA OR VOMITING   *DIARRHEA    THANK YOU IN ADVANCE FOR HELPING TO KEEP OTHER PATIENTS AND STAFF SAFE.Smoking Cessation resources are available by calling 1-800-QUIT-NOW [phone])    Thank you for choosing [facility] for your medical care.  We hope you were satisfied with the care you received.  Please contact the Patient Advocate at [phone] if you have any questions or concerns related to your care.    If you experience a life threatening emergency, call 911 immediately or proceed to the closet ER.     If someone you know is dealing with an addiction issue and seeking help call the [facility] at [phone]-[phone]).      If you need to schedule an appointment such as an ultrasound or an MRI or other advanced imaging, please call [facility] scheduling at [phone].    Please remember to take ALL your belongings with you when you leave your appointment.      To assist [place] in providing quality care, you may receive a patient satisfaction survey in the mail.  Please take a few minutes to complete the survey, as we value your feedback and look for opportunities to serve you better. Tip for Including Protein in Your Diet    Hard or semi soft cheese (e.g. Cheddar, Jack, brick) Melt on sandwiches, bread, muffins, tortillas, hamburgers, hot dogs, other meats or fish, vegetables, eggs and desserts such as stewed fruit or pies.    Grate and add to soups, sauces, casseroles, vegetable  dishes, potatoes, rice noodles or meat loaf    Serve as a snack with crackers or bagels.   Cottage Cheese/Ricotta Cheese Mix with or use to stuff fruits and vegetables    Add to casseroles, spaghetti, noodles or egg dishes such as omelets, scrambled eggs and souffles.    Use in gelatin, pudding type desserts, cheesecake and pancake or waffle batter    Use to stuff crepes, pasta shells or manicotti    Puree and use as a substitute for sour cream   Milk Use in beverages and in cooking    Use in preparing foods , such as hot cereal, soups, cocoa or pudding    Add cream sauces to vegetable and other dishes    Use evaporated milk, evaporated skim milk or sweetened condensed milk instead of milk or water in recipes   Non-fat Dry Milk Add 1/3 cup nonfat dry powered milk to each cup of regular milk for "double strength" milk.    Add to yogurt and milk drinks such as pasteurized eggnog and milkshakes      Add to scrambled eggs and mashed potatoes    Use in casseroles, meat loaf, hot cereal, breads, muffins, sauces, cream soups, puddings and custards and other milk based desserts.    Caution:  Nonfat  dry milk may contain bacteria.  Add nonfat dry milk to a small amount of boiling water before adding to beverages or food that will not be cooked prior to use.  These mixed beverages and food items need to be refrigerated promptly and used within 24 hours of the mixing time.   Commercial Protein Supplements Use nutrition supplements such as instant breakfast, Sustacal , Ensure , Med[employer]onic , [employer] , [employer]  or [employer] .  These may be purchased in grocery stores or pharmacy departments.    Use instant breakfast powder in milk drinks and desserts such as pudding    Mix with ice cream, milk and fruit or flavorings for a high-protein milk shake    Add powdered protein powder, such as ProCal Supplement , Promod , Pro Source  to foods such as soups, custards,puddings or orange juice or lemonade.   Ice Cream,  Yogurt, and Frozen Yogurt Add to carbonated beverage such as ginger ale    Add to milk drinks such a milkshakes    Add to cereals, fruits, gelatin desserts and pies    Blend or whip wit soft or cooked fruits    Sandwich ice cream or frozen yogurt between enriched cake slices, cookies or graham crackers    Use seasoned yogurt as a dip for fruits, vegetables or chips    Use yogurt in place of sour cream in casseroles   Eggs, Egg Whites and Egg Yolks Add chopped , hard cooked eggs to salads and dressings, vegetables, casseroles and creamed meats    Beat eggs into mashed potatoes, vegetable purees and sauces    Add extra whites to quiches, scrambled eggs, custards, puddings and pancake and [place] toast batter,    Make a rich custard with egg yolks, double strength milk and sugar.    Add extra hard-cooked yolks to deviled egg filling and sandwich spreads    Caution:  Do not serve raw or undercooked egg products die to risk of salmonella bacteria causing illness.    Note:  Use pasteurized liquid eggs if the food will not be thoroughly cooked,  Pasteurized liquid eggs are available in the frozen food section of grocery stores   Nuts. Seeds and Wheat Germ Add to casseroles, breads, muffins, pancakes, cookies and waffles.    Sprinkle on fruit, cereal, ice cream, yogurt, vegetables, salads and toast as a crunchy topping.    Use in place of breadcrumbs    Blend with parsley or spinach, herbs and cream for a noodle, pasta or vegetable sauce    Roll banana in chopped nuts   Peanut buter Spread on sandwiches, toast, muffins, crackers, waffles, pancakes or fruit slices.    Use as a dip for raw vegetables, such as carrots, cauliflower and celery    Blend with milk drinks and beverages    Swirl through soft ice cream and yogurt    Spread on a banana and then roll banana in crushed cereal or chopped nuts.   Meat and Fish Add chopped cooked meat or fish to vegetables, salads,  casseroles, soups, sauces and biscuit dough.    Use in  omelets, souffles, quiches and sandwich fillings.    Add chicken and [place] to [employer] in piecrust or biscuit dough as turnovers    Add to stuffed baked potatoes    Add pureed meat to soups    Eat calves or chicken liver or heart, which are especially good sources of protein, vitamins and  minerals   Beans Cook and use dried peas, beans and bean curd (tofu) in soups or add to casseroles, pastas and grain dishes that also contain cheese or meat.    Mash with cheese and milk    Use tofu to make smoothies.

## 2019-08-16 ENCOUNTER — Other Ambulatory Visit: Payer: Self-pay

## 2019-08-17 ENCOUNTER — Other Ambulatory Visit (HOSPITAL_BASED_OUTPATIENT_CLINIC_OR_DEPARTMENT_OTHER): Payer: Self-pay | Admitting: Family Medicine

## 2019-08-19 NOTE — Telephone Encounter (Signed)
Refrill request for Atrovent. Last appointment on 07/31/2019.    Rod Can, MA  08/19/2019, 08:26

## 2019-08-20 ENCOUNTER — Other Ambulatory Visit: Payer: Self-pay

## 2019-08-21 ENCOUNTER — Encounter (HOSPITAL_COMMUNITY): Payer: Self-pay | Admitting: Surgery

## 2019-08-21 ENCOUNTER — Other Ambulatory Visit: Payer: Self-pay

## 2019-08-21 ENCOUNTER — Ambulatory Visit: Payer: Medicaid Other | Attending: Surgery | Admitting: Surgery

## 2019-08-21 DIAGNOSIS — L97528 Non-pressure chronic ulcer of other part of left foot with other specified severity: Secondary | ICD-10-CM | POA: Insufficient documentation

## 2019-08-21 DIAGNOSIS — E11621 Type 2 diabetes mellitus with foot ulcer: Secondary | ICD-10-CM | POA: Insufficient documentation

## 2019-08-21 DIAGNOSIS — Z872 Personal history of diseases of the skin and subcutaneous tissue: Secondary | ICD-10-CM

## 2019-08-21 DIAGNOSIS — I872 Venous insufficiency (chronic) (peripheral): Secondary | ICD-10-CM

## 2019-08-21 DIAGNOSIS — F17210 Nicotine dependence, cigarettes, uncomplicated: Secondary | ICD-10-CM | POA: Insufficient documentation

## 2019-08-21 DIAGNOSIS — L97518 Non-pressure chronic ulcer of other part of right foot with other specified severity: Secondary | ICD-10-CM | POA: Insufficient documentation

## 2019-08-21 DIAGNOSIS — I83025 Varicose veins of left lower extremity with ulcer other part of foot: Secondary | ICD-10-CM | POA: Insufficient documentation

## 2019-08-21 LAB — POC FINGERSTICK GLUCOSE - BMC/JMC (RESULTS): GLUCOSE, POC: 184 mg/dL — ABNORMAL HIGH (ref 60–100)

## 2019-08-21 NOTE — Nursing Note (Signed)
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   WOUND SITE CLOSURE None   Periwound Pink   Wound Bed 76%-100% fibrin  (scabbed)   Wound Length (cm) 0.1 cm   Wound Width (cm) 0.1 cm   Wound Depth (cm) 0.1 cm   Wound Volume (cm^3) 0.001 cm^3   Wound Healing % 100   Wound Surface Area (cm^2) 0.01 cm^2   Wound Undermining No   Wound Tunneling  No   Wound Drainage Amount None   Wound Odor Absent   DRESSING TYPE Calamine/Zinc Oxide Wrap  (2 layer full)   DRESSING STATUS Removed   Wound (Non-Surgical) Dorsal;Right Foot   Initial Date of Wound Assessment/Initial Time of Wound Assessment: 07/24/19 0829   Wound Approximate Age at First Assessment (Weeks): 4 weeks  Orientation: Dorsal;Right  Location: Foot   Wound Image    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

## 2019-08-21 NOTE — Patient Instructions (Addendum)
IF YOU ARE EXPERIENCING ANY OF THE FOLLOWING SYMPTOMS, PLEASE CALL us BEFORE YOUR APPOINTMENT, AS YOU MAY NEED TO BE RESCHEDULED.     *FEVER   *COUGH   *FATIGUE/MUSCLE ACHES   *HEADACHE   *LOSS OF TASTE OR SMELL   *SORE THROAT   *CONGESTION OR RUNNY NOSE   *NAUSEA OR VOMITING   *DIARRHEA    THANK YOU IN ADVANCE FOR HELPING TO KEEP OTHER PATIENTS AND STAFF SAFE.    Smoking Cessation resources are available by calling 1-800-QUIT-NOW 647-733-3079)    Thank you for choosing [facility] for your medical care.  We hope you were satisfied with the care you received.  Please contact the Patient Advocate at [phone] if you have any questions or concerns related to your care.    If you experience a life threatening emergency, call 911 immediately or proceed to the closet ER.     If someone you know is dealing with an addiction issue and seeking help call the [facility] at [phone]).      If you need to schedule an appointment such as an ultrasound or an MRI or other advanced imaging, please call [facility] scheduling at [phone].    Please remember to take ALL your belongings with you when you leave your appointment.      To assist [place] in providing quality care, you may receive a patient satisfaction survey in the mail.  Please take a few minutes to complete the survey, as we value your feedback and look for opportunities to serve you better.     Center for [place] on the First Floor at [place] for [place] and Hyperbaric Medicine  at  [facility]    What is a Venous Stasis Ulcer?    A venous stasis ulcer occurs when problem in the veins of the lower leg prevent blood from being effectively pumped back to the heart.  The problem may be valve dysfunction, blockage, backward blood flow or failure of the calf muscles to pump.  The blood pools in the lower leg which causes swelling, damages  tissue and may lead to a wound.    How do I know if I have a Venous Statis Ulcer?    Venous stasis ulcers usually occur in the inner ankle region and on the lower leg.  The ulcer may be large, is usually shallow, edges are irregular in shape, there may be a lot of drainage, and it may be painful to touch.  Often the skin surrounding the wound is dark an reddish/purple in color.  It is best to visit your physician or the [facility] for evaluation.  They can help.  There are special tests available to help evaluate the ulcer to ensure proper treatment and dressings.    What causes a venous stasis ulcer?    The following increase your risk of developing a venous stasis ulcer:  Lower leg swelling  History of blood clot  High blood pressure  Previous surgery  Standing or sitting for long periods  Obesity  Family history of venous stasis ulcers  Varicose veins  Leg trauma    Can my venous stasis ulcer be treated?    Yes!  Venous stasis ulcers can be treated with compression therapy to decrease swelling and promote blood flow back to the heart.  The [place] team will also use special wound dressings  that manage drainage and protect your skin.  You may also be a  Candidate for advanced healing option such as corrective surgery and bioengineered tissue grafts.    Prevention    Prevention is the key to not developing venous stasis ulcers.  Once you have an ulcer you are at greater risk for developing ulcers in the future.  Here ar some action s you can take to stay ulcer free:    Wear compression hose that are prescribed by your physician  Avoid crossing your legs  Stop smoking  Maintain an ideal body weight  Keep skin moisturized  Do not stand or sit for long periods of time  Take short walks trough out the day if tolerated  Inspect lower leg skin daily and rapport any breaks in the skin to your physician.  Take all medications as prescribed  While sitting or standing flex feet and ankles to exercise calf  muscles  If legs are swollen or painful, elevate above the level of your heart.    The Center for Wound Care and Hyperbaric Medicine at  [place] for [place] and Hyperbaric Medicine at  [facility] focuses on preserving limbs and healing wounds through a comprehensive team approach.    Our staff includes physicians, nurses, a diabetic educator, Registered Dietitian and therapists who are high-skilled and experienced in healing complex wounds.  Our interdisciplinary team aggressively treats chronic wounds by applying evidence-based, medical and surgical techniques that are outcome focused and cost-effective.    To promote optimal recovery, the program uses the latest techniques, products and services.  Our healing specialists teach patients and their caregivers how to properly handle wounds at home to advance healing.    Teamwork, coordinated care, advanced technologies, easy access and excellent outcomes define the  [facility] Wound Healing Program.

## 2019-08-21 NOTE — Progress Notes (Signed)
Center for Wound Care and [place], [place] [id number]    Wound Care Progress Note      Date of Service: 08/21/2019  [name], 64 y.o., White female  Date of Birth:  08/13/1955  MRN: [id number]  PCP: Hubbard Robinson, DO  Information Obtained from: Patient.    Reason for visit:   Chief Complaint   Patient presents with   . Wound       HPI:    Past medical history, past surgical history, social history and family history have been reviewed. she complains of an open wound. The wound has closed  Social History     Tobacco Use   Smoking Status Current Every Day Smoker   . Packs/day: 1.00   . Years: 20.00   . Pack years: 20.00   . Types: Cigarettes   Smokeless Tobacco Never Used       Review of Systems  Review of Systems   Constitutional: Negative for activity change, appetite change, chills, diaphoresis, fatigue and fever.   HENT: Negative for congestion, dental problem, drooling, facial swelling, rhinorrhea, sore throat and trouble swallowing.    Eyes: Negative for pain, discharge and itching.   Respiratory: Negative for apnea, cough, choking, chest tightness, shortness of breath, wheezing and stridor.    Cardiovascular: Negative for chest pain, palpitations and leg swelling.   Gastrointestinal: Negative for abdominal distention, abdominal pain, anal bleeding, blood in stool, constipation, diarrhea, nausea, rectal pain and vomiting.   Endocrine: Negative for cold intolerance, heat intolerance and polydipsia.   Genitourinary: Negative for difficulty urinating, dysuria, enuresis, flank pain, frequency, hematuria and pelvic pain.   Musculoskeletal: Negative for arthralgias, back pain, joint swelling, myalgias, neck pain and neck stiffness.   Skin: Negative for color change, pallor, rash and wound.   Allergic/Immunologic: Negative for immunocompromised state.   Neurological: Negative for dizziness, seizures, syncope, weakness, light-headedness, numbness and  headaches.   Hematological: Negative for adenopathy. Does not bruise/bleed easily.   Psychiatric/Behavioral: Negative for agitation, confusion, hallucinations and suicidal ideas. The patient is not nervous/anxious.      Problem List:  Patient Active Problem List    Diagnosis   . COPD (chronic obstructive pulmonary disease) (CMS HCC)   . Hypokalemia   . Atrial fibrillation (CMS HCC)   . Sepsis due to cellulitis (CMS [place])   . COPD exacerbation (CMS HCC)   . Atrial fibrillation with rapid ventricular response (CMS HCC)   . Acute hypoxemic respiratory failure (CMS HCC)   . Sepsis   . CAP (community acquired pneumonia)   . Acute exacerbation of chronic obstructive pulmonary disease (COPD) (CMS HCC)   . COPD with acute exacerbation (CMS HCC)   . Tobacco use disorder   . Acute respiratory failure (CMS HCC)   . Non-compliance   . Hypertension due to endocrine disorder   . Diabetes (CMS [place])   . Chronic hypoxemic respiratory failure (CMS HCC)   . Obesity (BMI 35.0-39.9 without comorbidity)       History and vital signs as completed by clinical staff and reviewed by Flora Lipps, MD  Past Medical History  Current Outpatient Medications   Medication Sig   . ADVAIR DISKUS 500-50 mcg/dose Inhalation Disk with Device oral diskus inhaler INHALE 1 DOSE BY MOUTH TWICE DAILY (Patient taking differently: Take 1 INHALATION by inhalation Twice daily INHALE 1 DOSE BY MOUTH TWICE DAILY)   . albuterol (PROVENTIL) 2.5 mg/0.5 mL Inhalation Solution for Nebulization  USE 1 VIAL IN NEBULIZER EVERY 4 HOURS (Patient taking differently: 2.5 mg by Nebulization route Every 4 hours as needed  for bronchospasm prevention)   . albuterol sulfate (PROVENTIL HFA) 90 mcg/actuation Inhalation HFA Aerosol Inhaler Take 2 Puffs by inhalation Four times a day. Indications: chronic obstructive pulmonary disease   . albuterol sulfate (PROVENTIL) 2.5 mg /3 mL (0.083 %) Inhalation Solution for Nebulization USE 1 VIAL IN NEBULIZER 4 TIMES DAILY   . apixaban  (ELIQUIS) 5 mg Oral Tablet Take 1 Tablet (5 mg total) by mouth Twice daily   . atorvastatin (LIPITOR) 40 mg Oral Tablet Take 1 tablet by mouth once daily (Patient taking differently: Take 40 mg by mouth Every evening )   . ATROVENT HFA 17 mcg/actuation Inhalation HFA Aerosol Inhaler oral inhaler INHALE 2 PUFFS BY MOUTH 4 TIMES DAILY   . insulin glargine (LANTUS SOLOSTAR U-100 INSULIN) 100 unit/mL Subcutaneous Insulin Pen 30 Units by Subcutaneous route Twice daily   . ipratropium (ATROVENT) 0.02 % Inhalation Solution INHALE 1 VIAL IN NEBULIZER EVERY 4 HOURS   . lisinopriL (PRINIVIL) 5 mg Oral Tablet Take 1 tablet by mouth once daily (Patient taking differently: Take 5 mg by mouth Once a day Take 1 tablet by mouth once daily)   . metoprolol succinate (TOPROL-XL) 50 mg Oral Tablet Sustained Release 24 hr Take 1 Tablet (50 mg total) by mouth Once a day for 30 days   . oxygen (O2) Inhalation gas Take 3-4 L/min by inhalation continuous. concentrator and portable. May increase oxygen to 4 L NC with activity as needed for shortness of breath.   Indications: COPD with Chronic Bronchitis     Allergies   Allergen Reactions   . Codeine Nausea/ Vomiting     Sick to stomach   . Hydrocodone Nausea/ Vomiting   . Metformin Diarrhea   . Oxycodone Nausea/ Vomiting     Past Medical History:   Diagnosis Date   . COPD (chronic obstructive pulmonary disease) (CMS HCC)    . Degenerative joint disease involving multiple joints    . Diabetes mellitus (CMS [place])    . HTN (hypertension)    . Smoker    . 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 Wears glasses          Past Surgical History:   Procedure Laterality Date   . HX CARPAL TUNNEL RELEASE           Family Medical History:     Problem Relation (Age of Onset)    COPD Mother    Coronary Artery Disease Father    Diabetes Mother, Sister, Brother, Paternal Grandmother            Social History     Socioeconomic History   . Marital status: Married     Spouse name: Not on file   .  Number of children: Not on file   . Years of education: Not on file   . Highest education level: Not on file   Tobacco Use   . Smoking status: Current Every Day Smoker     Packs/day: 1.00     Years: 20.00     Pack years: 20.00     Types: Cigarettes   . Smokeless tobacco: Never Used   Vaping Use   . Vaping Use: Never used   Substance and Sexual Activity   . Alcohol use: No   . Drug use: No   Other Topics Concern  Social Determinants of Health     Financial Resource Strain:    . Difficulty of Paying Living Expenses:    Food Insecurity:    . Worried About Charity fundraiser in the Last Year:    . Arboriculturist in the Last Year:    Transportation Needs:    . Film/video editor (Medical):    [place] Kitchen Lack of Transportation (Non-Medical):    Physical Activity:    . Days of Exercise per Week:    . Minutes of Exercise per Session:    Stress:    . Feeling of Stress :    Intimate Partner Violence:    . Fear of Current or Ex-Partner:    . Emotionally Abused:    [place] Kitchen Physically Abused:    . Sexually Abused:        BP (!) 147/62   Pulse 100   Temp 36.5 C (97.7 F)   Resp 20   LMP  (LMP Unknown)   SpO2 90%         Physical Exam   Physical Exam  Constitutional:       General: She is not in acute distress.     Appearance: She is not diaphoretic.   HENT:      Head: Normocephalic and atraumatic.      Mouth/Throat:      Pharynx: No oropharyngeal exudate.   Eyes:      General: No scleral icterus.        Right eye: No discharge.         Left eye: No discharge.      Conjunctiva/sclera: Conjunctivae normal.      Pupils: Pupils are equal, round, and reactive to light.   Neck:      Thyroid: No thyromegaly.      Vascular: No JVD.   Cardiovascular:      Rate and Rhythm: Normal rate and regular rhythm.      Heart sounds: Normal heart sounds. No murmur heard.   No friction rub. No gallop.    Pulmonary:      Effort: No respiratory distress.      Breath sounds: No wheezing or rales.   Chest:      Chest wall: No tenderness.   Abdominal:       General: There is no distension.      Palpations: There is no mass.      Tenderness: There is no abdominal tenderness. There is no guarding or rebound.   Musculoskeletal:         General: No tenderness or deformity. Normal range of motion.      Cervical back: Normal range of motion and neck supple.   Lymphadenopathy:      Cervical: No cervical adenopathy.   Skin:     Coloration: Skin is not pale.      Findings: No erythema or rash.   Neurological:      Mental Status: She is alert and oriented to person, place, and time.      Cranial Nerves: No cranial nerve deficit.      Motor: No abnormal muscle tone.      Coordination: Coordination normal.      Deep Tendon Reflexes: Reflexes are normal and symmetric. Reflexes normal.   Psychiatric:         Judgment: Judgment normal.         Examination  Vitals:  Temperature: 36.5 C (97.7 F) (08/21/19 1002)  Heart Rate: 100 (08/21/19 1002)  Respiratory Rate: 20 (08/21/19 1002)  BP (Non-Invasive): (!) 147/62 (08/21/19 1002)   /     Integumentary Assessment  Wound Assessment  Nursing Notes:   SeeLanelle Bal, RN  08/21/19 1022  Signed     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   WOUND SITE CLOSURE None   Periwound Pink   Wound Bed 76%-100% fibrin  (scabbed)   Wound Length (cm) 0.1 cm   Wound Width (cm) 0.1 cm   Wound Depth (cm) 0.1 cm   Wound Volume (cm^3) 0.001 cm^3   Wound Healing % 100   Wound Surface Area (cm^2) 0.01 cm^2   Wound Undermining No   Wound Tunneling  No   Wound Drainage Amount None   Wound Odor Absent   DRESSING TYPE Calamine/Zinc Oxide Wrap  (2 layer full)   DRESSING STATUS Removed   Wound (Non-Surgical) Dorsal;Right Foot   Initial Date of Wound Assessment/Initial  Time of Wound Assessment: 07/24/19 0829   Wound Approximate Age at First Assessment (Weeks): 4 weeks  Orientation: Dorsal;Right  Location: Foot   Wound Image    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       PROCEDURES   See Procedure Note    Infection status:   No signs/symptoms of infection  @ENCORDICORDWOUND @  No orders of the defined types were placed in this encounter.      Neuro/Vascular Assessment       Most Recent ABI/TBI      Office Visit from 07/31/2019 in [place] ABI 0.92 filed at... 07/31/2019 0832   Lt ABI 0.94 filed at... 07/31/2019 0832   Rt TBI 0.42 filed at... 07/31/2019 0832   Lt TBI 1.04 filed at... 07/31/2019 0832        @ENCORDVASCWOUND @    Nutritional Assessment:    Recent Labs     07/11/19  1500   ALBUMIN 2.4*     Nutritional Status discussed with patient.    Diabetes Assessment:  Lab Results   Component Value Date    GLUCPOCT 254 (H) 10/11/2012    GLUIP 184 (H) 08/21/2019   ]    F/U:  Follow up with physician prn  Flora Lipps, MD  08/21/2019, 10:32

## 2019-08-22 ENCOUNTER — Other Ambulatory Visit (HOSPITAL_BASED_OUTPATIENT_CLINIC_OR_DEPARTMENT_OTHER): Payer: Self-pay | Admitting: Family Medicine

## 2019-08-22 MED ORDER — IPRATROPIUM BROMIDE 0.02 % SOLUTION FOR INHALATION
RESPIRATORY_TRACT | 0 refills | Status: DC
Start: 2019-08-22 — End: 2019-08-30

## 2019-08-22 NOTE — Telephone Encounter (Signed)
Pt called to request refill on     Ipratropium 0.02% inhalation solution    Last OV 07/31/19  Pt requested refills on Rx if possible    Shellia Carwin  08/22/2019, 09:18

## 2019-08-23 ENCOUNTER — Other Ambulatory Visit: Payer: Self-pay

## 2019-08-26 ENCOUNTER — Other Ambulatory Visit (HOSPITAL_BASED_OUTPATIENT_CLINIC_OR_DEPARTMENT_OTHER): Payer: Self-pay | Admitting: Family Medicine

## 2019-08-26 MED ORDER — METOPROLOL SUCCINATE ER 50 MG TABLET,EXTENDED RELEASE 24 HR
50.0000 mg | ORAL_TABLET | Freq: Every day | ORAL | 2 refills | Status: DC
Start: 2019-08-26 — End: 2019-11-25

## 2019-08-26 NOTE — Telephone Encounter (Signed)
Pt last seen 07/31/19.  Thresa Ross, LPN  5/86/8257, 49:35

## 2019-08-27 ENCOUNTER — Other Ambulatory Visit: Payer: Self-pay

## 2019-08-28 ENCOUNTER — Telehealth (HOSPITAL_BASED_OUTPATIENT_CLINIC_OR_DEPARTMENT_OTHER): Payer: Self-pay | Admitting: Family Medicine

## 2019-08-29 NOTE — Telephone Encounter (Signed)
Pt called to request refill on     Ipratropium Inhalation solution    Please call Pt to confirm we sent Rx to pharm  Last OV 07/31/19    [place]  08/29/2019, 09:19

## 2019-08-29 NOTE — Telephone Encounter (Signed)
Medication was already filled on 08.12.2021.     [place], [place]  08/29/2019, 11:30

## 2019-08-29 NOTE — Nursing Note (Signed)
Services currently provided to patient:  Skilled Nursing    Case Conference Notes:      Summary of Care:  [facility] 7/1 - 7/4 Sepsis d/t Cellulitis bilateral extremities. Chronic hypoxic respiratory failure.  Non-compliant diabetic, smoker. multiple ER visits, refuses COVID vaccination. Non-compliant with oxygen safety. LV systolic function grossly low. Sepsis   also 2020. Refuses to log glucose.      Priority Issues:  Wound care, safety, education re: hypoxic respiratory failure prognosis, potentially open the doors into respiratory hospice. Pt is only 11 but looks much, much older. Agreed to MSW visit for only explanation of Advanced Directives, Living Will, and   "[employer]".     Discipline Notes:  SN Note Author: Ruta Hinds, RN. Date: 07/31/19.  Note: Pt referred to [facility] following a hospital stay for sepsis due to cellulitis of bilateral lower extremities.  Pt receiving SN for observation, assessement, education, medication management, and w, ound care.  Pt with significant open areas (post blister) throughout both calves and feet; therefore, pt has had frequent visits to the wound center.  Only 2 visits have been made by [facility] SN.  Pt's insurance only approved 5 visits.  SN will continue to,  see until approval runs out.    No Value exists for the SDE: [facility]#041  No Value exists for the SDE: [facility]#042  No Value exists for the SDE: [facility]#043  No Value exists for the SDE: [facility]#947

## 2019-08-29 NOTE — Telephone Encounter (Signed)
Yes but the Pt is claiming she "has been using this more often and is almost out of the whole Rx now" I let her know it was just filled on 08/22/19 and will see if the Dr will send new Rx over?    [name]  08/29/2019, 11:49

## 2019-08-30 ENCOUNTER — Other Ambulatory Visit: Payer: Self-pay

## 2019-08-30 MED ORDER — IPRATROPIUM BROMIDE 0.02 % SOLUTION FOR INHALATION
RESPIRATORY_TRACT | 0 refills | Status: DC
Start: 2019-08-30 — End: 2019-09-02

## 2019-08-30 NOTE — Telephone Encounter (Signed)
Pt called to report she is using inhaler much more often due to "not being able to breathe" and is needing refill approximately every 5 days due to using every 4hrs. Please call Pt with questions or to let her know we sent Rx over to [place]  08/30/2019, 09:28

## 2019-08-30 NOTE — Telephone Encounter (Signed)
Pt called again to check on refill for Ipratropium inhalation solution    Shellia Carwin  08/30/2019, 14:59

## 2019-09-02 ENCOUNTER — Ambulatory Visit (INDEPENDENT_AMBULATORY_CARE_PROVIDER_SITE_OTHER): Payer: Medicaid Other | Admitting: Family Medicine

## 2019-09-02 ENCOUNTER — Other Ambulatory Visit: Payer: Self-pay

## 2019-09-02 ENCOUNTER — Encounter (HOSPITAL_BASED_OUTPATIENT_CLINIC_OR_DEPARTMENT_OTHER): Payer: Self-pay | Admitting: Family Medicine

## 2019-09-02 VITALS — BP 140/66 | HR 100 | Temp 98.4°F | Resp 24 | Ht 63.0 in | Wt 176.2 lb

## 2019-09-02 DIAGNOSIS — Z6831 Body mass index (BMI) 31.0-31.9, adult: Secondary | ICD-10-CM

## 2019-09-02 DIAGNOSIS — E119 Type 2 diabetes mellitus without complications: Secondary | ICD-10-CM

## 2019-09-02 DIAGNOSIS — I509 Heart failure, unspecified: Secondary | ICD-10-CM

## 2019-09-02 DIAGNOSIS — Z720 Tobacco use: Secondary | ICD-10-CM

## 2019-09-02 DIAGNOSIS — J431 Panlobular emphysema: Secondary | ICD-10-CM

## 2019-09-02 DIAGNOSIS — Z794 Long term (current) use of insulin: Secondary | ICD-10-CM

## 2019-09-02 LAB — POCT BLOOD GLUCOSE/FINGERSTICK (AMB): GLUCOSE, POINT OF CARE: 7.5

## 2019-09-02 MED ORDER — FREESTYLE LIBRE 14 DAY SENSOR KIT
1.0000 | PACK | Freq: Every day | 3 refills | Status: AC
Start: 2019-09-02 — End: ?

## 2019-09-02 MED ORDER — FREESTYLE LIBRE 14 DAY READER
1.0000 | Freq: Every day | 3 refills | Status: AC
Start: 2019-09-02 — End: ?

## 2019-09-02 MED ORDER — IPRATROPIUM 0.5 MG-ALBUTEROL 3 MG (2.5 MG BASE)/3 ML NEBULIZATION SOLN
3.0000 mL | INHALATION_SOLUTION | Freq: Four times a day (QID) | RESPIRATORY_TRACT | 2 refills | Status: DC
Start: 2019-09-02 — End: 2020-01-07

## 2019-09-02 MED ORDER — PROAIR RESPICLICK 90 MCG/ACTUATION BREATH ACTIVATED
180.0000 ug | INHALATION_SPRAY | RESPIRATORY_TRACT | 4 refills | Status: DC
Start: 2019-09-02 — End: 2020-03-19

## 2019-09-02 NOTE — Progress Notes (Signed)
[place], [place]  [place]  [place] [id number]  Office Visit    ID: [name]   DOB: [birth year]  Date of Service: 09/02/2019     Chief Complaint(s):   Chief Complaint   Patient presents with    Cellulitis       SUBJECTIVE:  Patient is a 64 year old female coming in for follow up of cellulitis. She sts that she is overall doing better and feeling better since her last visit in office. Pt notes that the swelling in her legs has decreased since her last visit. She mentions that she has been trying to monitor her diet but sts that she only checks her blood sugar once a week. Pt sts that she is taking 30 units of her insulin daily and is doing well on her medication. She notes that she does not have episodes of feeling like her sugar is too low. Pt mentions that she has been using her Advair inhaler regularly and has been using her other inhalers as needed to help manage her breathing. She sts that her breathing has been overall well controlled. Pt notes that she has been trying to quit smoking and has cut back.     ROS:  Constitutional: Denies fevers, chills, night sweats. No recent weight changes or fatigue.   Eyes: Denies change in vision. No irritation, erythema, discharge or pain.   Ears: Denies any difficulty with hearing. No ear pain, tinnitus or discharge.   Mouth/Throat: Denies any oral ulcers or other lesions. No sore throat, hoarseness or dysphagia.  Cardiovascular: Denies any chest pain, palpitations, PND or DOE  Respiratory: Positive for intermittent shortness of breath secondary to COPD. Denies any wheezing or cough   GI: Denies any abdominal pain, nausea, vomiting, diarrhea or constipation. No melena or hematochezia.  GU: Denies any dysuria, frequency, hematuria, hesitancy. Denies nocturia or incontinence.  Musculoskeletal: Positive for improving edema to bilateral lower extremities.   Skin: Denies any recent rashes or lesions.     Past Medical History:  Patient Active Problem  List    Diagnosis Date Noted    COPD (chronic obstructive pulmonary disease) (CMS [place]) 07/12/2019    Hypokalemia 07/12/2019    Atrial fibrillation (CMS HCC) 07/12/2019    Sepsis due to cellulitis (CMS [place]) 07/11/2019    COPD exacerbation (CMS HCC) 06/26/2019    Atrial fibrillation with rapid ventricular response (CMS [place]) 06/18/2019    Acute hypoxemic respiratory failure (CMS [place]) 12/04/2018    Sepsis 10/27/2018    CAP (community acquired pneumonia) 10/26/2018    Acute exacerbation of chronic obstructive pulmonary disease (COPD) (CMS [place]) 03/27/2018    COPD with acute exacerbation (CMS [place]) 05/23/2017    Tobacco use disorder 05/22/2017    Acute respiratory failure (CMS HCC) 05/21/2017    Non-compliance 03/27/2017    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       Obesity (BMI 35.0-39.9 without comorbidity) 10/08/2012     Medications:  Current Outpatient Medications   Medication Sig    ADVAIR DISKUS 500-50 mcg/dose Inhalation Disk with Device oral diskus inhaler INHALE 1 DOSE BY MOUTH TWICE DAILY (Patient taking differently: Take 1 INHALATION by inhalation Twice daily INHALE 1 DOSE BY MOUTH TWICE DAILY)    albuterol (PROVENTIL) 2.5  mg/0.5 mL Inhalation Solution for Nebulization USE 1 VIAL IN NEBULIZER EVERY 4 HOURS (Patient taking differently: 2.5 mg by Nebulization route Every 4 hours as needed  for bronchospasm prevention)    albuterol sulfate (PROAIR RESPICLICK) 90 mcg/actuation Inhalation Aerosol Powdr Breath Activated Take 180 mcg by inhalation Every 4 hours    albuterol sulfate (PROVENTIL HFA) 90 mcg/actuation Inhalation HFA Aerosol Inhaler Take 2 Puffs by inhalation Four times a day. Indications: chronic obstructive pulmonary disease    albuterol sulfate (PROVENTIL) 2.5 mg /3 mL (0.083 %) Inhalation Solution for Nebulization USE  1 VIAL IN NEBULIZER 4 TIMES DAILY    apixaban (ELIQUIS) 5 mg Oral Tablet Take 1 Tablet (5 mg total) by mouth Twice daily    atorvastatin (LIPITOR) 40 mg Oral Tablet Take 1 tablet by mouth once daily (Patient taking differently: Take 40 mg by mouth Every evening )    flash glucose scanning reader (FREESTYLE LIBRE 14 DAY READER) Does not apply Misc 1 Applicator by Does not apply route Once a day    flash glucose sensor (FREESTYLE LIBRE 14 DAY SENSOR) Does not apply Kit 1 Device by Does not apply route Once a day    furosemide (LASIX) 20 mg Oral Tablet Take 20 mg by mouth Once a day    insulin glargine (LANTUS SOLOSTAR U-100 INSULIN) 100 unit/mL Subcutaneous Insulin Pen 30 Units by Subcutaneous route Twice daily (Patient taking differently: 30 Units by Subcutaneous route Once a day )    ipratropium-albuterol 0.5 mg-3 mg(2.5 mg base)/3 mL Solution for Nebulization 3 mL by Nebulization route Four times a day    lisinopriL (PRINIVIL) 5 mg Oral Tablet Take 1 tablet by mouth once daily (Patient taking differently: Take 5 mg by mouth Once a day Take 1 tablet by mouth once daily)    metoprolol succinate (TOPROL-XL) 50 mg Oral Tablet Sustained Release 24 hr Take 1 Tablet (50 mg total) by mouth Once a day for 90 days    oxygen (O2) Inhalation gas Take 3-4 L/min by inhalation continuous. concentrator and portable. May increase oxygen to 4 L NC with activity as needed for shortness of breath.   Indications: COPD with Chronic Bronchitis      Allergies:  Allergies   Allergen Reactions    Codeine Nausea/ Vomiting     Sick to stomach    Hydrocodone Nausea/ Vomiting    Metformin Diarrhea    Oxycodone Nausea/ Vomiting      Social History:  Social History     Tobacco Use    Smoking status: Current Every Day Smoker     Packs/day: 1.00     Years: 20.00     Pack years: 20.00     Types: Cigarettes    Smokeless tobacco: Never Used   Brewing technologist Use: Never used   Substance Use Topics    Alcohol use: No    Drug use: No       OBJECTIVE:  BP (!) 140/66    Pulse 100    Temp 36.9 C (98.4 F)    Resp (!) 24    Ht 1.6 m ('5\' 3"' )    Wt 79.9 kg (176 lb 3.2 oz)    LMP  (LMP Unknown)    SpO2 93% Comment: 4L 02   BMI 31.21 kg/m     Physical Exam:  General: No apparent acute distress. Very pleasant.   Eyes: Conjunctiva are clear. No discharge or icterus noted.  HENT: Mucous membranes are moist.  Nares are clear. Posterior oropharynx is clear without erythema or exudates.    Neck: Supple. Thyroid palpated as normal  Lungs: Clear to auscultation bilaterally. Distant but clear breath sounds. 4 liters of oxygen via nasal cannula. No wheezes or rhonchi.    Cardiovascular: Normal rate and regular rhythm. No murmurs, rubs or gallops.  Abdomen: Soft. BS present. Non-tender. No rebound, guarding, or peritoneal signs.   Extremities: Atraumatic. No cyanosis. No significant peripheral edema.  Skin: Warm and dry. No significant rashes or lesions  Neurologic: Strength and sensation grossly normal throughout. Normal gait.    Psychiatric: Alert and oriented x 3. Affect within normal limits.    ASSESSMENT and PLAN:  1. Panlobular emphysema (CMS HCC)    2. Congestive heart failure, NYHA class 2, unspecified congestive heart failure type (CMS HCC)    3. Type 2 diabetes mellitus without complication, with long-term current use of insulin (CMS HCC)         COPD:  Still requiring 4 liters of oxygen daily to keep saturation at 93-94%. She is still smoking but less than a pack every 2 days which is a cut back for her. Will continue Advair for now along with Duonebs. Again placed a referral for pulmonology for further therapy.     CHF:  Her EF worsened last month to 30-35%. Discussed we will recheck in 3 months with a new echocardiogram. Will continue Lasix 20 mg daily, Toprol XL 50 mg daily, and Lisinopril 5 mg daily.     Diabetes:  A1c is 7.5 which is an improvement. Continue Lantus 30 units daily. Continue low carb diet.     Atrial Fibrillation:  Well controlled.  Continue Eliquis 5 mg BID. Follow up in 3 months.       I am scribing for, and in the presence of, Dr. Sabra Heck for services provided on 09/02/2019.  Angel Riggin, SCRIBE   [place], [place]  09/02/2019, 11:13      Visit Diagnoses and associated Orders -- Summary:        ICD-10-CM    1. Panlobular emphysema (CMS HCC)  J43.1 Refer to [facility]     ipratropium-albuterol 0.5 mg-3 mg(2.5 mg base)/3 mL Solution for Nebulization     albuterol sulfate (PROAIR RESPICLICK) 90 mcg/actuation Inhalation Aerosol Powdr Breath Activated   2. Congestive heart failure, NYHA class 2, unspecified congestive heart failure type (CMS HCC)  I50.9    3. Type 2 diabetes mellitus without complication, with long-term current use of insulin (CMS HCC)  E11.9     Z79.4        Portions of this note may be dictated using voice recognition software. Variances in spelling and vocabulary are possible and unintentional. Not all errors are caught/corrected. Please notify the [facility] if any discrepancies are noted or if the meaning of any statement is not clear.     I personally performed the services described in this documentation, as scribed  in my presence, and it is both accurate  and complete.    Hubbard Robinson, [place], DO  09/02/2019, 11:31

## 2019-09-02 NOTE — Nursing Note (Signed)
09/02/19 1100   A1C   Time Performed 1109   A1C 7.5   References Ranges 4 - 6%   A1C Lot # [id number]   Expiration Date 04/17/21   Internal Control Valid yes   Initials MM   Lancet used on  Finger

## 2019-09-02 NOTE — Nursing Note (Signed)
Chief Complaint:   Chief Complaint            Cellulitis         Functional Health Screen  Functional Health Screening:   Patient is under 18: No  Have you had a recent unexplained weight loss or gain?: No  Because we are aware of abuse and domestic violence today, we ask all patients: Are you being hurt, hit, or frightened by anyone at your home or in your life?: No  Do you have any basic needs within your home that are not being met? (such as Food, Shelter, Games developer, Transportation): No  Patient is under 18 and therefore has no Advance Directives: No  Patient has: No Advance  Patient has Advance Directive: No  Patient offered: Refused Packet  Screening unable to be completed: No       BP (!) 140/66    Pulse 100    Temp 36.9 C (98.4 F)    Resp (!) 24    Ht 1.6 m (_0 )    Wt 79.9 kg (176 lb 3.2 oz)    LMP  (LMP Unknown)    SpO2 93% Comment: 4L 02   BMI 31.21 kg/m       Social History     Tobacco Use   Smoking Status Current Every Day Smoker    Packs/day: 1.00    Years: 20.00    Pack years: 20.00    Types: Cigarettes   Smokeless Tobacco Never Used     Patient Health Rating           Depression Screening  PHQ Questionnaire     Allergies:  Allergies   Allergen Reactions    Codeine Nausea/ Vomiting     Sick to stomach    Hydrocodone Nausea/ Vomiting    Metformin Diarrhea    Oxycodone Nausea/ Vomiting     Medication History  Reviewed for OTC medication and any new medications, provider will review medication history  Results through Enter/Edit  No results found for this or any previous visit (from the past 24 hour(s)).  POCT Results  Care Team  Patient Care Team:  Hubbard Robinson, DO as PCP - General ([place])  Immunizations - last 24 hours     None        Maye Hides, LPN  4/58/0998, 33:82

## 2019-09-03 ENCOUNTER — Other Ambulatory Visit: Payer: Self-pay

## 2019-09-06 ENCOUNTER — Other Ambulatory Visit: Payer: Self-pay

## 2019-09-12 ENCOUNTER — Other Ambulatory Visit (HOSPITAL_BASED_OUTPATIENT_CLINIC_OR_DEPARTMENT_OTHER): Payer: Self-pay | Admitting: Family Medicine

## 2019-09-12 DIAGNOSIS — I872 Venous insufficiency (chronic) (peripheral): Secondary | ICD-10-CM

## 2019-09-12 MED ORDER — APIXABAN 5 MG TABLET
5.0000 mg | ORAL_TABLET | Freq: Two times a day (BID) | ORAL | 3 refills | Status: DC
Start: 2019-09-12 — End: 2020-03-19

## 2019-09-12 NOTE — Telephone Encounter (Signed)
Pt called to request refill on     Apixaban 5 mg     Last OV 8/23    [name] [facility]  09/12/2019, 08:51

## 2019-11-01 ENCOUNTER — Other Ambulatory Visit (HOSPITAL_BASED_OUTPATIENT_CLINIC_OR_DEPARTMENT_OTHER): Payer: Self-pay | Admitting: Family Medicine

## 2019-11-07 ENCOUNTER — Emergency Department
Admission: EM | Admit: 2019-11-07 | Discharge: 2019-11-07 | Disposition: A | Discharge status: Home / Self Care | Payer: Medicaid Other | Attending: Emergency Medicine | Admitting: Emergency Medicine

## 2019-11-07 ENCOUNTER — Other Ambulatory Visit: Payer: Self-pay

## 2019-11-07 ENCOUNTER — Emergency Department (HOSPITAL_COMMUNITY)
Admission: RE | Admit: 2019-11-07 | Discharge: 2019-11-07 | Disposition: A | Discharge status: Home / Self Care | Payer: Medicaid Other | Source: Ambulatory Visit

## 2019-11-07 ENCOUNTER — Encounter (HOSPITAL_COMMUNITY): Payer: Self-pay

## 2019-11-07 DIAGNOSIS — Z9981 Dependence on supplemental oxygen: Secondary | ICD-10-CM | POA: Insufficient documentation

## 2019-11-07 DIAGNOSIS — F17210 Nicotine dependence, cigarettes, uncomplicated: Secondary | ICD-10-CM | POA: Insufficient documentation

## 2019-11-07 DIAGNOSIS — R9431 Abnormal electrocardiogram [ECG] [EKG]: Secondary | ICD-10-CM | POA: Insufficient documentation

## 2019-11-07 DIAGNOSIS — J449 Chronic obstructive pulmonary disease, unspecified: Secondary | ICD-10-CM | POA: Insufficient documentation

## 2019-11-07 DIAGNOSIS — U071 COVID-19: Secondary | ICD-10-CM | POA: Insufficient documentation

## 2019-11-07 LAB — COMPREHENSIVE METABOLIC PANEL, NON-FASTING
ALBUMIN: 3.5 g/dL (ref 3.4–4.8)
ALKALINE PHOSPHATASE: 61 U/L (ref 50–130)
ALT (SGPT): 18 U/L (ref 8–22)
ANION GAP: 10 mmol/L (ref 4–13)
AST (SGOT): 19 U/L (ref 8–45)
BILIRUBIN TOTAL: 0.5 mg/dL (ref 0.3–1.3)
BUN/CREA RATIO: 9 (ref 6–22)
BUN: 6 mg/dL — ABNORMAL LOW (ref 8–25)
CALCIUM: 8.5 mg/dL — ABNORMAL LOW (ref 8.8–10.2)
CHLORIDE: 94 mmol/L — ABNORMAL LOW (ref 96–111)
CO2 TOTAL: 29 mmol/L (ref 23–31)
CREATININE: 0.64 mg/dL (ref 0.60–1.05)
ESTIMATED GFR: >90 mL/min/BSA (ref >=60)
GLUCOSE: 262 mg/dL — ABNORMAL HIGH (ref 65–125)
POTASSIUM: 3.9 mmol/L (ref 3.5–5.1)
PROTEIN TOTAL: 6.7 g/dL (ref 6.0–8.0)
SODIUM: 133 mmol/L — ABNORMAL LOW (ref 136–145)

## 2019-11-07 LAB — CBC WITH DIFF
BASOPHIL #: 0.1 10*3/uL (ref 0.00–0.10)
BASOPHIL %: 1 % (ref 0–3)
EOSINOPHIL #: 0 10*3/uL (ref 0.00–0.50)
EOSINOPHIL %: 0 % (ref 0–5)
HCT: 40.3 % (ref 36.0–45.0)
HGB: 13.5 g/dL (ref 12.0–15.5)
LYMPHOCYTE #: 1 10*3/uL (ref 1.00–4.80)
LYMPHOCYTE %: 17 % (ref 15–43)
MCH: 29.6 pg (ref 27.5–33.2)
MCHC: 33.5 g/dL (ref 32.0–36.0)
MCV: 88.3 fL (ref 82.0–97.0)
MONOCYTE #: 0.7 10*3/uL (ref 0.20–0.90)
MONOCYTE %: 13 % — ABNORMAL HIGH (ref 5–12)
MPV: 8.8 fL (ref 7.4–10.5)
NEUTROPHIL #: 4 10*3/uL (ref 1.50–6.50)
NEUTROPHIL %: 69 % (ref 43–76)
PLATELETS: 176 10*3/uL (ref 150–450)
RBC: 4.56 10*6/uL (ref 4.00–5.10)
RDW: 13.7 % (ref 11.0–16.0)
WBC: 5.8 10*3/uL (ref 4.0–11.0)

## 2019-11-07 LAB — BLUE TOP TUBE

## 2019-11-07 LAB — TROPONIN-I: TROPONIN I: 9 ng/L (ref 7–30)

## 2019-11-07 LAB — RED TOP TUBE

## 2019-11-07 LAB — LIGHT GREEN TOP TUBE

## 2019-11-07 LAB — COVID-19 ~~LOC~~ MOLECULAR LAB TESTING: SARS-CoV-2: DETECTED — AB

## 2019-11-07 LAB — GOLD TOP TUBE

## 2019-11-07 MED ORDER — ONDANSETRON 4 MG DISINTEGRATING TABLET
4.0000 mg | ORAL_TABLET | Freq: Three times a day (TID) | ORAL | 0 refills | Status: AC | PRN
Start: 2019-11-07 — End: ?

## 2019-11-07 MED ORDER — PREDNISONE 20 MG TABLET
80.0000 mg | ORAL_TABLET | Freq: Every day | ORAL | 0 refills | Status: DC
Start: 2019-11-07 — End: 2019-12-04

## 2019-11-07 MED ORDER — SODIUM CHLORIDE 0.9 % (FLUSH) INJECTION SYRINGE
10.0000 mL | INJECTION | Freq: Three times a day (TID) | INTRAMUSCULAR | Status: DC
Start: 2019-11-07 — End: 2019-11-07

## 2019-11-07 MED ORDER — AZITHROMYCIN 250 MG TABLET
ORAL_TABLET | ORAL | 0 refills | Status: DC
Start: 2019-11-07 — End: 2019-12-04

## 2019-11-07 NOTE — ED Provider Notes (Signed)
[facility]  Emergency Department Visit Note    Date:  11/07/2019  Primary care provider:  Hubbard Robinson, DO  Means of arrival:  private car  History obtained from: patient  History limited by: none    Chief Complaint:  Cough and congestion    HISTORY OF PRESENT ILLNESS     [name], date of birth [birth year], is a 64 y.o. female who presents to the Emergency Department complaining of cough and congestion for roughly 1 week.  The patient states that she is concerned she has COVID.  She presents to the emergency department with her husband who is quite ill and being admitted for COVID-19.  The patient is concerned that she is positive as well.  The patient has a history of COPD and wears 4 L of oxygen by nasal cannula.  She states that she is having no shortness of breath.  The patient denies any nausea, diarrhea, or vomiting.  The patient has not had any fevers or muscle aches.  She presents to the ER requesting further evaluation and management for her symptoms that she rates as mild and states I just want to get checked out make sure I am okay .    REVIEW OF SYSTEMS     The pertinent positive and negative symptoms are as per HPI. All other systems reviewed and are negative.     PATIENT HISTORY     Past Medical History:  Past Medical History:   Diagnosis Date    COPD (chronic obstructive pulmonary disease) (CMS HCC)     Degenerative joint disease involving multiple joints     Diabetes mellitus (CMS HCC)     HTN (hypertension)     Smoker     Supplemental oxygen dependent     3 lpm O2 via NC    Wears glasses      Past Surgical History:  Past Surgical History:   Procedure Laterality Date    Hx carpal tunnel release       Social History:  Social History     Tobacco Use    Smoking status: Current Every Day Smoker     Packs/day: 1.00     Years: 20.00     Pack years: 20.00     Types: Cigarettes    Smokeless tobacco: Never Used   Brewing technologist Use: Never used   Substance Use  Topics    Alcohol use: No    Drug use: No     Social History     Substance and Sexual Activity   Drug Use No     Medications:        Taking? Last Dose Start Date End Date Provider    ADVAIR DISKUS 500-50 mcg/dose Inhalation Disk with Device oral diskus inhaler   11/01/19 -- Hubbard Robinson, DO    Take 1 INHALATION by inhalation Twice daily INHALE 1 DOSE BY MOUTH TWICE DAILY    albuterol (PROVENTIL) 2.5 mg/0.5 mL Inhalation Solution for Nebulization   09/24/18 -- Hubbard Robinson, DO    USE 1 VIAL IN NEBULIZER EVERY 4 HOURS    Patient taking differently: 2.5 mg by Nebulization route Every 4 hours as needed for bronchospasm prevention    albuterol sulfate (PROAIR RESPICLICK) 90 mcg/actuation Inhalation Aerosol Powdr Breath Activated   09/02/19 -- Hubbard Robinson, DO    Take 180 mcg by inhalation Every 4 hours    albuterol sulfate (PROVENTIL HFA) 90 mcg/actuation  Inhalation HFA Aerosol Inhaler   01/10/18 -- Hubbard Robinson, DO    Take 2 Puffs by inhalation Four times a day. Indications: chronic obstructive pulmonary disease    albuterol sulfate (PROVENTIL) 2.5 mg /3 mL (0.083 %) Inhalation Solution for Nebulization   08/12/19 -- Hubbard Robinson, DO    USE 1 VIAL IN NEBULIZER 4 TIMES DAILY    apixaban (ELIQUIS) 5 mg Oral Tablet   09/12/19 -- Hubbard Robinson, DO    Take 1 Tablet (5 mg total) by mouth Twice daily    atorvastatin (LIPITOR) 40 mg Oral Tablet   12/17/18 -- Hubbard Robinson, DO    Take 1 tablet by mouth once daily    Patient taking differently: Take 40 mg by mouth Every evening     flash glucose scanning reader (FREESTYLE LIBRE 14 DAY READER) Does not apply Misc   09/02/19 -- Hubbard Robinson, DO    1 Applicator by Does not apply route Once a day    flash glucose sensor (FREESTYLE LIBRE 14 DAY SENSOR) Does not apply Kit   09/02/19 -- Hubbard Robinson, DO    1 Device by Does not apply route Once a day    furosemide (LASIX) 20 mg Oral Tablet   -- -- Provider, Historical    insulin glargine (LANTUS SOLOSTAR U-100 INSULIN) 100 unit/mL  Subcutaneous Insulin Pen   06/20/19 Garwin Brothers, MD    30 Units by Subcutaneous route Twice daily    Patient taking differently: 30 Units by Subcutaneous route Once a day     ipratropium-albuterol 0.5 mg-3 mg(2.5 mg base)/3 mL Solution for Nebulization   09/02/19 -- Hubbard Robinson, DO    3 mL by Nebulization route Four times a day    lisinopriL (PRINIVIL) 5 mg Oral Tablet   11/05/18 -- Hubbard Robinson, DO    Take 1 tablet by mouth once daily    Patient taking differently: Take 5 mg by mouth Once a day Take 1 tablet by mouth once daily    metoprolol succinate (TOPROL-XL) 50 mg Oral Tablet Sustained Release 24 hr   08/26/19 11/24/19 Hubbard Robinson, DO    Take 1 Tablet (50 mg total) by mouth Once a day for 90 days    oxygen (O2) Inhalation gas   01/10/18 -- Hubbard Robinson, DO    Take 3-4 L/min by inhalation continuous. concentrator and portable. May increase oxygen to 4 L NC with activity as needed for shortness of breath. Indications: COPD with Chronic Bronchitis     Allergies:  Allergies   Allergen Reactions    Codeine Nausea/ Vomiting     Sick to stomach    Hydrocodone Nausea/ Vomiting    Metformin Diarrhea    Oxycodone Nausea/ Vomiting       PHYSICAL EXAM     Vitals:  Filed Vitals:    11/07/19 1107   BP: 117/73   Pulse: 94   Resp: (!) 26   Temp: 37.2 C (98.9 F)   SpO2: 93%       Pulse ox  93% on None (Room Air) interpreted by me as: Normal    Constitutional: The patient is alert and oriented to person, place and time.  The patient is appropriately interactive with a nontoxic, but chronically ill appearance.  The patient appears in no distress and is resting comfortably in the wheelchair in triage room 3  Eyes: Pupils equal and round, reactive to light and accomodation. There is normal, painless extraocular muscle motion.  ENT: Atraumatic, normocephalic  head, mucous membranes moist,  Trachea is midline without stridor.  Neck: No JVD or thyromegaly or lymphadenopathy, supple.  Lungs: Clear to auscultation bilaterally.   Slightly increased inspiratory:expiratory ratio. No respiratory distress.  Cardiovascular: Heart is S1-S2 regular rate and rhythm without murmur click gallop or rub.  Abdomen: Soft, non-tender, non-distended without evidence of rebound or guarding.  Skin: No cyanosis, jaundice, rash or lesion.  Vascular: Normal peripheral pulses with brisk capillary refills of less than 2 seconds.  Psychiatric: normal affect.  Normal insight.  No evidence of psychosis.    DIAGNOSTIC STUDIES     Labs:    Results for orders placed or performed during the hospital encounter of 11/07/19   COMPREHENSIVE METABOLIC PANEL, NON-FASTING   Result Value Ref Range    SODIUM 133 (L) 136 - 145 mmol/L    POTASSIUM 3.9 3.5 - 5.1 mmol/L    CHLORIDE 94 (L) 96 - 111 mmol/L    CO2 TOTAL 29 23 - 31 mmol/L    ANION GAP 10 4 - 13 mmol/L    BUN 6 (L) 8 - 25 mg/dL    CREATININE 0.64 0.60 - 1.05 mg/dL    BUN/CREA RATIO 9 6 - 22    ESTIMATED GFR >90 >=60 mL/min/BSA    ALBUMIN 3.5 3.4 - 4.8 g/dL     CALCIUM 8.5 (L) 8.8 - 10.2 mg/dL    GLUCOSE 262 (H) 65 - 125 mg/dL    ALKALINE PHOSPHATASE 61 50 - 130 U/L    ALT (SGPT) 18 8 - 22 U/L    AST (SGOT)  19 8 - 45 U/L    BILIRUBIN TOTAL 0.5 0.3 - 1.3 mg/dL    PROTEIN TOTAL 6.7 6.0 - 8.0 g/dL   TROPONIN-I   Result Value Ref Range    TROPONIN I 9 7 - 30 ng/L   CBC WITH DIFF   Result Value Ref Range    WBC 5.8 4.0 - 11.0 x103/uL    RBC 4.56 4.00 - 5.10 x106/uL    HGB 13.5 12.0 - 15.5 g/dL    HCT 40.3 36.0 - 45.0 %    MCV 88.3 82.0 - 97.0 fL    MCH 29.6 27.5 - 33.2 pg    MCHC 33.5 32.0 - 36.0 g/dL    RDW 13.7 11.0 - 16.0 %    PLATELETS 176 150 - 450 x103/uL    MPV 8.8 7.4 - 10.5 fL    NEUTROPHIL % 69 43 - 76 %    LYMPHOCYTE % 17 15 - 43 %    MONOCYTE % 13 (H) 5 - 12 %    EOSINOPHIL % 0 0 - 5 %    BASOPHIL % 1 0 - 3 %    NEUTROPHIL # 4.00 1.50 - 6.50 x103/uL    LYMPHOCYTE # 1.00 1.00 - 4.80 x103/uL    MONOCYTE # 0.70 0.20 - 0.90 x103/uL    EOSINOPHIL # 0.00 0.00 - 0.50 x103/uL    BASOPHIL # 0.10 0.00 - 0.10 x103/uL    BLUE TOP TUBE   Result Value Ref Range    RAINBOW/EXTRA TUBE AUTO RESULT Yes    COVID-19 SCREENING (COVID only)   Result Value Ref Range    SARS-CoV-2 Detected (A) Not Detected     Labs reviewed and interpreted by me.    Radiology: Study Result    Narrative & Impression   RADIOLOGIST: Gennie Alma, MD    EXAMINATION: XR AP MOBILE CHEST     EXAM  DATE/TIME: 11/07/2019 11:50 AM    CLINICAL INDICATION: Chest pain    FINDINGS: Compared to study of 07/11/2019. Cardiac silhouette is enlarged. Groundglass infiltrates in the mid and lower lung zones on the left. Tiny left pleural effusion versus thickening.    IMPRESSION:  As above     XR AP MOBILE CHEST  Radiological imaging interpreted by radiologist and independently reviewed by me.    ED PROGRESS NOTE / MEDICAL DECISION MAKING     Old records reviewed by KC:[email or web address] department nurse's Notes are reviewed      Orders Placed This Encounter    XR AP MOBILE CHEST (If patient condition warrants)    CBC/DIFF    COMPREHENSIVE METABOLIC PANEL, NON-FASTING    TROPONIN-I    RAINBOW DRAW - [facility] ONLY    CBC WITH DIFF    BLUE TOP TUBE    GOLD TOP TUBE    RED TOP TUBE    LIGHT GREEN TOP TUBE    COVID-19 SCREENING (COVID only)    ECG 12-LEAD (Take to provider with a brief history)    INSERT & MAINTAIN PERIPHERAL IV ACCESS    SCHEDULE COVID-19 INFUSION - MONOCLONAL ANTIBODY - [facility] - [place]    NS flush syringe    azithromycin (ZITHROMAX) 250 mg Oral Tablet    predniSONE (DELTASONE) 20 mg Oral Tablet    ondansetron (ZOFRAN ODT) 4 mg Oral Tablet, Rapid Dissolve        1335:  Based on the patient's clinical presentation and physical examination findings, I believe that discharge is appropriate.  Patient's laboratory testing and imaging studies demonstrate typical COVID symptoms.  The patient is currently not hypoxic on her home dose of oxygen.  I will start the patient on azithromycin and prednisone.  Patient will be given Zofran as needed for any nausea  that may develop.  I consented the patient for immunoglobulin infusion which I believe is imperative given her comorbidities.  The patient understood and agreed to the treatment plan will be given the referral.  She is discharged home in stable condition.  Return instructions were discussed the patient understood them.    Pre-Disposition Vitals:  Filed Vitals:    11/07/19 1107   BP: 117/73   Pulse: 94   Resp: (!) 26   Temp: 37.2 C (98.9 F)   SpO2: 93%           CLINICAL IMPRESSION     1. COVID-19    DISPOSITION/PLAN     Discharged       Prescriptions:     Current Discharge Medication List      START taking these medications    Details   azithromycin (ZITHROMAX) 250 mg Oral Tablet Take 500 mg (2 tab) on day 1; take 250 mg (1 tab) on days 2-5.  Qty: 6 Tablet, Refills: 0      ondansetron (ZOFRAN ODT) 4 mg Oral Tablet, Rapid Dissolve Take 1 Tablet (4 mg total) by mouth Every 8 hours as needed for Nausea/Vomiting  Qty: 12 Tablet, Refills: 0      predniSONE (DELTASONE) 20 mg Oral Tablet Take 4 Tablets (80 mg total) by mouth Once a day  Qty: 20 Tablet, Refills: 0             Follow-Up:     No follow-up provider specified.    Condition at Disposition: Stable        SCRIBE ATTESTATION STATEMENT  I Jonah Blue, DO scribed for Jonah Blue, DO on  11/07/2019 at 3:36 PM.     Documentation assistance provided for Jonah Blue, DO  by Jonah Blue, DO. Information recorded by the scribe was done at my direction and has been reviewed and validated by me Jonah Blue, DO.

## 2019-11-07 NOTE — ED Triage Notes (Signed)
Patient presents c/o SOB, cough, congestion and runny nose x1 week. Reports known covid exposure x1 week ago. Patient wear 4L O2 for COPD. Denies chest pain. Increased work of breathing noticed in triage.

## 2019-11-07 NOTE — ED Nurses Note (Signed)
Patient discharged home with family.  AVS reviewed with patient/care giver.  A written copy of the AVS and discharge instructions was given to the patient/care giver.  Questions sufficiently answered as needed.  Patient/care giver encouraged to follow up with PCP as indicated.  In the event of an emergency, patient/care giver instructed to call 911 or go to the nearest emergency room.        Medication List      START taking these medications     azithromycin 250 mg Tablet; Commonly known as: ZITHROMAX; Take 500 mg (2   tab) on day 1; take 250 mg (1 tab) on days 2-5.   ondansetron 4 mg Tablet, Rapid Dissolve; Commonly known as: ZOFRAN ODT;   Take 1 Tablet (4 mg total) by mouth Every 8 hours as needed for   Nausea/Vomiting   predniSONE 20 mg Tablet; Commonly known as: DELTASONE; Take 4 Tablets   (80 mg total) by mouth Once a day     CHANGE how you take these medications     * albuterol sulfate 90 mcg/actuation HFA Aerosol Inhaler; Commonly known   as: Proventil HFA; Take 2 Puffs by inhalation Four times a day.   Indications: chronic obstructive pulmonary disease; What changed: Another   medication with the same name was changed. Make sure you understand how   and when to take each.   * albuterol 2.5 mg/0.5 mL Solution for Nebulization; Commonly known as:   PROVENTIL; USE 1 VIAL IN NEBULIZER EVERY 4 HOURS; What changed: See the   new instructions.   * albuterol sulfate 2.5 mg /3 mL (0.083 %) Solution for Nebulization;   Commonly known as: PROVENTIL; USE 1 VIAL IN NEBULIZER 4 TIMES DAILY; What   changed: Another medication with the same name was changed. Make sure you   understand how and when to take each.   * ProAir RespiClick 90 mcg/actuation Aerosol Powdr Breath Activated;   Generic drug: albuterol sulfate; Take 180 mcg by inhalation Every 4 hours;   What changed: Another medication with the same name was changed. Make sure   you understand how and when to take each.   atorvastatin 40 mg Tablet; Commonly  known as: LIPITOR; Take 1 tablet by   mouth once daily; What changed: See the new instructions.   Lantus Solostar U-100 Insulin 100 unit/mL Insulin Pen; Generic drug:   insulin glargine; 30 Units by Subcutaneous route Twice daily; What   changed: when to take this   lisinopriL 5 mg Tablet; Commonly known as: PRINIVIL; Take 1 tablet by   mouth once daily; What changed: how much to take, how to take this, when   to take this  * This list has 4 medication(s) that are the same as other medications   prescribed for you. Read the directions carefully, and ask your doctor or   other care provider to review them with you.     CONTINUE taking these medications     Advair Diskus 500-50 mcg/dose Disk with Device oral diskus inhaler;   Generic drug: fluticasone propion-salmeteroL; Take 1 INHALATION by   inhalation Twice daily INHALE 1 DOSE BY MOUTH TWICE DAILY   apixaban 5 mg Tablet; Commonly known as: ELIQUIS; Take 1 Tablet (5 mg   total) by mouth Twice daily   FreeStyle Libre 14 Day Reader Misc; Generic drug: flash glucose scanning   reader; 1 Applicator by Does not apply route Once a day   FreeStyle Libre 14 Day Sensor Kit; Generic  drug: flash glucose sensor; 1   Device by Does not apply route Once a day   furosemide 20 mg Tablet; Commonly known as: LASIX   ipratropium-albuteroL 0.5 mg-3 mg(2.5 mg base)/3 mL Solution for   Nebulization; Commonly known as: DUONEB; 3 mL by Nebulization route Four   times a day   metoprolol succinate 50 mg Tablet Sustained Release 24 hr; Commonly   known as: TOPROL-XL; Take 1 Tablet (50 mg total) by mouth Once a day for   90 days   oxygen gas; Commonly known as: o2; Take 3-4 L/min by inhalation   continuous. concentrator and portable. May increase oxygen to 4 L NC with   activity as needed for shortness of breath.   Indications: COPD with   Chronic Bronchitis

## 2019-11-08 ENCOUNTER — Other Ambulatory Visit (HOSPITAL_COMMUNITY): Payer: Self-pay

## 2019-11-08 ENCOUNTER — Ambulatory Visit
Admission: RE | Admit: 2019-11-08 | Discharge: 2019-11-08 | Disposition: A | Discharge status: Home / Self Care | Payer: Medicaid Other | Source: Ambulatory Visit | Attending: Emergency Medicine | Admitting: Emergency Medicine

## 2019-11-08 VITALS — BP 137/70 | HR 76 | Temp 98.0°F | Resp 20

## 2019-11-08 DIAGNOSIS — U071 COVID-19: Secondary | ICD-10-CM

## 2019-11-08 LAB — ECG 12-LEAD
Atrial Rate: 92 {beats}/min
Calculated P Axis: 61 degrees
Calculated R Axis: 70 degrees
Calculated T Axis: 81 degrees
PR Interval: 154 ms
QRS Duration: 98 ms
QT Interval: 398 ms
QTC Calculation: 492 ms
Ventricular rate: 92 {beats}/min

## 2019-11-08 MED ORDER — SODIUM CHLORIDE 0.9 % INTRAVENOUS SOLUTION
1200.0000 mg | Freq: Once | INTRAVENOUS | Status: AC
Start: 2019-11-08 — End: 2019-11-08
  Administered 2019-11-08: 0 mg via INTRAVENOUS
  Administered 2019-11-08: 1200 mg via INTRAVENOUS

## 2019-11-08 MED ORDER — DIPHENHYDRAMINE 50 MG/ML INJECTION SOLUTION
50.0000 mg | Freq: Once | INTRAMUSCULAR | Status: DC | PRN
Start: 2019-11-08 — End: 2019-11-09

## 2019-11-08 MED ORDER — DIPHENHYDRAMINE 50 MG CAPSULE
50.0000 mg | ORAL_CAPSULE | Freq: Once | ORAL | Status: AC
Start: 2019-11-08 — End: 2019-11-08
  Administered 2019-11-08: 50 mg via ORAL

## 2019-11-08 MED ORDER — EPINEPHRINE 1 MG/ML (1 ML) INJECTION SOLUTION
0.3000 mg | Freq: Once | INTRAMUSCULAR | Status: DC | PRN
Start: 2019-11-08 — End: 2019-11-09

## 2019-11-08 MED ORDER — ACETAMINOPHEN 325 MG TABLET
650.0000 mg | ORAL_TABLET | Freq: Once | ORAL | Status: AC
Start: 2019-11-08 — End: 2019-11-08
  Administered 2019-11-08: 650 mg via ORAL

## 2019-11-08 MED ORDER — ALBUTEROL SULFATE HFA 90 MCG/ACTUATION AEROSOL INHALER - RN
2.0000 | Freq: Once | RESPIRATORY_TRACT | Status: DC | PRN
Start: 2019-11-08 — End: 2019-11-09

## 2019-11-08 MED ORDER — DEXTROSE 5% IN WATER (D5W) FLUSH BAG - 250 ML
INTRAVENOUS | Status: DC | PRN
Start: 2019-11-08 — End: 2019-11-09

## 2019-11-08 MED ORDER — DIPHENHYDRAMINE 50 MG/ML INJECTION SOLUTION
25.0000 mg | Freq: Once | INTRAMUSCULAR | Status: DC | PRN
Start: 2019-11-08 — End: 2019-11-09

## 2019-11-08 MED ORDER — HYDROCORTISONE SOD SUCCINATE 100 MG/2 ML VIAL WRAPPER
100.0000 mg | Freq: Once | INTRAMUSCULAR | Status: DC | PRN
Start: 2019-11-08 — End: 2019-11-09

## 2019-11-08 MED ORDER — SODIUM CHLORIDE 0.9% FLUSH BAG - 250 ML
INTRAVENOUS | Status: DC | PRN
Start: 2019-11-08 — End: 2019-11-09

## 2019-11-08 MED ORDER — FAMOTIDINE (PF) 20 MG/2 ML INTRAVENOUS SOLUTION
20.0000 mg | Freq: Once | INTRAVENOUS | Status: DC | PRN
Start: 2019-11-08 — End: 2019-11-09

## 2019-11-09 ENCOUNTER — Encounter (HOSPITAL_COMMUNITY): Payer: Self-pay | Admitting: Emergency Medicine

## 2019-11-12 ENCOUNTER — Emergency Department (HOSPITAL_COMMUNITY)
Admission: RE | Admit: 2019-11-12 | Discharge: 2019-11-12 | Disposition: A | Discharge status: Home / Self Care | Payer: Medicaid Other | Source: Ambulatory Visit

## 2019-11-12 ENCOUNTER — Emergency Department
Admission: EM | Admit: 2019-11-12 | Discharge: 2019-11-12 | Disposition: A | Discharge status: Home / Self Care | Payer: Medicaid Other | Attending: Emergency Medicine | Admitting: Emergency Medicine

## 2019-11-12 ENCOUNTER — Other Ambulatory Visit: Payer: Self-pay

## 2019-11-12 DIAGNOSIS — U071 COVID-19: Secondary | ICD-10-CM | POA: Insufficient documentation

## 2019-11-12 DIAGNOSIS — D72829 Elevated white blood cell count, unspecified: Secondary | ICD-10-CM | POA: Insufficient documentation

## 2019-11-12 DIAGNOSIS — J1282 Pneumonia due to coronavirus disease 2019: Secondary | ICD-10-CM | POA: Insufficient documentation

## 2019-11-12 DIAGNOSIS — F17210 Nicotine dependence, cigarettes, uncomplicated: Secondary | ICD-10-CM | POA: Insufficient documentation

## 2019-11-12 DIAGNOSIS — E876 Hypokalemia: Secondary | ICD-10-CM | POA: Insufficient documentation

## 2019-11-12 DIAGNOSIS — J449 Chronic obstructive pulmonary disease, unspecified: Secondary | ICD-10-CM | POA: Insufficient documentation

## 2019-11-12 LAB — CBC WITH DIFF
BASOPHIL #: 0.1 10*3/uL (ref 0.00–0.10)
BASOPHIL %: 1 % (ref 0–3)
EOSINOPHIL #: 0.1 10*3/uL (ref 0.00–0.50)
EOSINOPHIL %: 1 % (ref 0–5)
HCT: 38.9 % (ref 36.0–45.0)
HGB: 13 g/dL (ref 12.0–15.5)
LYMPHOCYTE #: 1.9 10*3/uL (ref 1.00–4.80)
LYMPHOCYTE %: 16 % (ref 15–43)
MCH: 29.7 pg (ref 27.5–33.2)
MCHC: 33.3 g/dL (ref 32.0–36.0)
MCV: 88.9 fL (ref 82.0–97.0)
MONOCYTE #: 1.4 10*3/uL — ABNORMAL HIGH (ref 0.20–0.90)
MONOCYTE %: 12 % (ref 5–12)
MPV: 7.7 fL (ref 7.4–10.5)
NEUTROPHIL #: 8.3 10*3/uL — ABNORMAL HIGH (ref 1.50–6.50)
NEUTROPHIL %: 71 % (ref 43–76)
PLATELETS: 418 10*3/uL (ref 150–450)
RBC: 4.38 10*6/uL (ref 4.00–5.10)
RDW: 13.9 % (ref 11.0–16.0)
WBC: 11.7 10*3/uL — ABNORMAL HIGH (ref 4.0–11.0)

## 2019-11-12 LAB — COMPREHENSIVE METABOLIC PANEL, NON-FASTING
ALBUMIN: 3 g/dL — ABNORMAL LOW (ref 3.4–4.8)
ALKALINE PHOSPHATASE: 65 U/L (ref 50–130)
ALT (SGPT): 12 U/L (ref 8–22)
ANION GAP: 10 mmol/L (ref 4–13)
AST (SGOT): 9 U/L (ref 8–45)
BILIRUBIN TOTAL: 0.6 mg/dL (ref 0.3–1.3)
BUN/CREA RATIO: 5 — ABNORMAL LOW (ref 6–22)
BUN: 3 mg/dL — ABNORMAL LOW (ref 8–25)
CALCIUM: 8.6 mg/dL — ABNORMAL LOW (ref 8.8–10.2)
CHLORIDE: 98 mmol/L (ref 96–111)
CO2 TOTAL: 33 mmol/L — ABNORMAL HIGH (ref 23–31)
CREATININE: 0.56 mg/dL — ABNORMAL LOW (ref 0.60–1.05)
ESTIMATED GFR: >90 mL/min/BSA (ref >=60)
GLUCOSE: 211 mg/dL — ABNORMAL HIGH (ref 65–125)
POTASSIUM: 3.1 mmol/L — ABNORMAL LOW (ref 3.5–5.1)
PROTEIN TOTAL: 6.5 g/dL (ref 6.0–8.0)
SODIUM: 141 mmol/L (ref 136–145)

## 2019-11-12 LAB — PTT (PARTIAL THROMBOPLASTIN TIME): APTT: 41.9 s — ABNORMAL HIGH (ref 24.0–36.5)

## 2019-11-12 LAB — PT/INR
INR: 1.16
PROTHROMBIN TIME: 13.5 s — ABNORMAL HIGH (ref 9.4–12.5)

## 2019-11-12 MED ORDER — DEXTROMETHORPHAN-GUAIFENESIN 10 MG-100 MG/5 ML ORAL SYRUP
10.0000 mL | ORAL_SOLUTION | ORAL | Status: AC
Start: 2019-11-12 — End: 2019-11-12
  Administered 2019-11-12: 10 mL via ORAL
  Filled 2019-11-12: qty 10

## 2019-11-12 NOTE — ED Nurses Note (Signed)
Received 64 y/o female to ER Room 13 with c/o shortness of breath. Patient reports that she tested positive for covid this past Thursday. Her husband is also positive for covid & admitted here. Patient is a known smoker, uses 4lpm nc at home. She reports that her oxygen saturations started dropping some at home today. Placed patient on cardiac monitor and oxygen at 4lpm nc. Will continue to monitor.

## 2019-11-12 NOTE — Discharge Instructions (Signed)
Please follow up with your primary care doctor in 7 days.  You will need to call to make this appointment.      Come back to the ER if you have any new concerning or worsening symptoms.

## 2019-11-12 NOTE — ED Nurses Note (Signed)
Patient discharged home. Verbal and written instructions given. Patient verbalized understanding of instructions. Patient transported to lobby via wheelchair by ER tech, oxygen in use 4lpm nc per home tank. Patients daughter will be transporting her home.

## 2019-11-12 NOTE — ED Triage Notes (Signed)
Pt presents to ED with increased SOB. Pt dx with covid recently and hx of COPD. Pt reports sats at home in low 80's. Pt here with sats in 90's. Pt with labored breathing in triage; no distress noted.

## 2019-11-12 NOTE — ED Provider Notes (Signed)
Melinda Oz, MD  [facility]  Emergency Department Visit Note    Date of Service: 11/12/2019  Primary Care Provider: Hubbard Robinson, DO  Means of arrival: Private Vehicle    History obtained from: Patient  History/Exam limitations: None    Chief Complaint:  Shortness of breath, noted low sat O2 on monitor at home    History of Present Illness     [name], 15-Aug-1955, is a/an 64 y.o. female who presents to the Emergency Department with shortness of breath, with low noted low sat O2 in the 80s on the monitor at home.  The patient has a history of COPD, on 4 L nasal cannula, and had recently contracted COVID, which was confirmed by a COVID testing.  The patient was here on 09/28, and had received the monoclonal antibody cocktail on 09/29, and states that she continues to have some shortness of breath and this coughing.  She states that is more the coughing, rather than the shortness of breath, will but was concerned about the low sat.    Review of Systems     The pertinent positive and negative symptoms are as per the HPI. They are otherwise negative as reviewed.    Patient History     Past Medical History:  Past Medical History:   Diagnosis Date   . COPD (chronic obstructive pulmonary disease) (CMS HCC)    . Degenerative joint disease involving multiple joints    . Diabetes mellitus (CMS [place])    . HTN (hypertension)    . Smoker    . 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 Wears glasses            Past Surgical History:  Past Surgical History:   Procedure Laterality Date   . HX CARPAL TUNNEL RELEASE             Family History:  Non-contributory    Social History:  Social History     Tobacco Use   . Smoking status: Current Every Day Smoker     Packs/day: 1.00     Years: 20.00     Pack years: 20.00     Types: Cigarettes   . Smokeless tobacco: Never Used   Vaping Use   . Vaping Use: Never used   Substance Use Topics   . Alcohol use: No   . Drug use: No       Social History     Substance and Sexual  Activity   Drug Use No       Current Outpatient Medications:   See triage note    Allergies:   Allergies   Allergen Reactions   . Codeine Nausea/ Vomiting     Sick to stomach   . Hydrocodone Nausea/ Vomiting   . Metformin Diarrhea   . Oxycodone Nausea/ Vomiting       Physical Exam     Vital Signs:  Filed Vitals:    11/12/19 2304 11/12/19 2315 11/12/19 2330 11/12/19 2345   BP:  (!) 168/70 (!) 167/72    Pulse:  88 87 92   Resp:  17 (!) 26 16   Temp: 37.1 C (98.7 F)      SpO2:  99% 99% 99%       Pulse Ox interpreted by me: 99% on 4 L nasal cannula, checked again, normal    Physical Exam:   Constitutional: No acute distress, slightly tachypneic  Head: Normocephalic  and atraumatic.   ENT: Moist mucous membranes. No erythema or exudates in the oropharynx. Normal voice.  Eyes: EOM are normal. Pupils are equal, round, and reactive to light. No scleral icterus.   Neck: Neck supple. FROM neck.  Cardiovascular: Normal rate and regular rhythm. No murmur heard. 2+ distal pulses all 4 extremities.  Pulmonary/Chest: Effort normal and breath sounds normal.  Slightly tachypneic  Abdominal: Soft with no distension. No abdominal tenderness  Back: There is no CVA tenderness.   Musculoskeletal:  No clubbing or cyanosis.  No lower extremity swelling  Neurological: Patient is alert and awake. Strength and sensation normal in all extremities. Normal facial symmetry and speech.   Skin: Skin is warm and dry.      Diagnostics     Labs:    Results for orders placed or performed during the hospital encounter of 11/12/19   COMPREHENSIVE METABOLIC PANEL, NON-FASTING   Result Value Ref Range    SODIUM 141 136 - 145 mmol/L    POTASSIUM 3.1 (L) 3.5 - 5.1 mmol/L    CHLORIDE 98 96 - 111 mmol/L    CO2 TOTAL 33 (H) 23 - 31 mmol/L    ANION GAP 10 4 - 13 mmol/L    BUN 3 (L) 8 - 25 mg/dL    CREATININE 0.56 (L) 0.60 - 1.05 mg/dL    BUN/CREA RATIO 5 (L) 6 - 22    ESTIMATED GFR >90 >=60 mL/min/BSA    ALBUMIN 3.0 (L) 3.4 - 4.8 g/dL     CALCIUM 8.6 (L) 8.8 -  10.2 mg/dL    GLUCOSE 211 (H) 65 - 125 mg/dL    ALKALINE PHOSPHATASE 65 50 - 130 U/L    ALT (SGPT) 12 8 - 22 U/L    AST (SGOT)  9 8 - 45 U/L    BILIRUBIN TOTAL 0.6 0.3 - 1.3 mg/dL    PROTEIN TOTAL 6.5 6.0 - 8.0 g/dL   PT/INR   Result Value Ref Range    PROTHROMBIN TIME 13.5 (H) 9.4 - 12.5 seconds    INR 1.16    PTT (PARTIAL THROMBOPLASTIN TIME)   Result Value Ref Range    APTT 41.9 (H) 24.0 - 36.5 seconds   CBC WITH DIFF   Result Value Ref Range    WBC 11.7 (H) 4.0 - 11.0 x10^3/uL    RBC 4.38 4.00 - 5.10 x10^6/uL    HGB 13.0 12.0 - 15.5 g/dL    HCT 38.9 36.0 - 45.0 %    MCV 88.9 82.0 - 97.0 fL    MCH 29.7 27.5 - 33.2 pg    MCHC 33.3 32.0 - 36.0 g/dL    RDW 13.9 11.0 - 16.0 %    PLATELETS 418 150 - 450 x10^3/uL    MPV 7.7 7.4 - 10.5 fL    NEUTROPHIL % 71 43 - 76 %    LYMPHOCYTE % 16 15 - 43 %    MONOCYTE % 12 5 - 12 %    EOSINOPHIL % 1 0 - 5 %    BASOPHIL % 1 0 - 3 %    NEUTROPHIL # 8.30 (H) 1.50 - 6.50 x10^3/uL    LYMPHOCYTE # 1.90 1.00 - 4.80 x10^3/uL    MONOCYTE # 1.40 (H) 0.20 - 0.90 x10^3/uL    EOSINOPHIL # 0.10 0.00 - 0.50 x10^3/uL    BASOPHIL # 0.10 0.00 - 0.10 x10^3/uL     Labs reviewed and interpreted by me.    Radiology:  Bilateral scattered  opacities, worse on the left than the right  XR CHEST AP  Interpreted by radiologist and independently reviewed by me.    EKG interpretation:  12-Lead EKG interpreted by me reveals sinus rhythm at 97, with poor R-wave progression, and ST depressions in the lateral leads, which are old from prior EKG      ED Progress Note/ Medical Decision Making     Old records reviewed by me:  Confirmed COVID positive on 10/28, and did make her appointment on 10/29 for the monoclonal antibody cocktail    Orders Placed This Encounter   . CANCELED: XR AP MOBILE CHEST   . XR CHEST AP   . CBC/DIFF   . COMPREHENSIVE METABOLIC PANEL, NON-FASTING   . PT/INR   . PTT (PARTIAL THROMBOPLASTIN TIME)   . CBC WITH DIFF   . ECG 12-LEAD   . dextromethorphan-guaiFENesin (ROBITUSSIN DM) 10-100mg  per 32mL  oral liquid       Triage had noted that the patient's sat O2 on her usual 4 L nasal cannula was 95%.  I had checked again with a separate sat O2 monitor, and it registered at 99% on the 4 L nasal cannula.  Currently, the patient is not hypoxic, on her usual 4 L of oxygen, and does not require remdesivir or steroids.  She has clear lung sounds, and I did review her chest x-ray from 10/28 for the same issue, did show pulmonary infiltrates bilaterally, consistent with COVID.    The patient was brought in to room 13, placed on the sat monitor, and on her usual 4 L nasal cannula, is 100%.  I reviewed the chest x-ray with her, which did show residual opacities, but this would be evident for likely the next month or 2, and she was told that she had hypokalemia at 3.1, which she was told that she could just be on a high potassium diet.  She agrees with this.  She states that she will try to use her daughter's sat O2 monitor.    Pre-Disposition Vitals:  Filed Vitals:    11/12/19 2304 11/12/19 2315 11/12/19 2330 11/12/19 2345   BP:  (!) 168/70 (!) 167/72    Pulse:  88 87 92   Resp:  17 (!) 26 16   Temp: 37.1 C (98.7 F)      SpO2:  99% 99% 99%       Clinical Impression     1. COVID viral infection  2. Viral pneumonia  3. Hypokalemia  4. Mild leukocytosis    Disposition/Plan     Discharged    Prescriptions:  New Prescriptions    No medications on file       Follow Up:  Melinda Robinson, DO  [address]  [address]  [place] [address]  [phone]    In 1 week        Condition on Disposition: Stable

## 2019-11-18 LAB — ECG 12-LEAD
Atrial Rate: 97 {beats}/min
Calculated P Axis: 48 degrees
Calculated R Axis: 76 degrees
Calculated T Axis: 111 degrees
PR Interval: 168 ms
QRS Duration: 100 ms
QT Interval: 380 ms
QTC Calculation: 482 ms
Ventricular rate: 97 {beats}/min

## 2019-11-25 ENCOUNTER — Other Ambulatory Visit (HOSPITAL_BASED_OUTPATIENT_CLINIC_OR_DEPARTMENT_OTHER): Payer: Self-pay | Admitting: Family Medicine

## 2019-11-25 DIAGNOSIS — I10 Essential (primary) hypertension: Secondary | ICD-10-CM

## 2019-11-25 DIAGNOSIS — E119 Type 2 diabetes mellitus without complications: Secondary | ICD-10-CM

## 2019-11-25 MED ORDER — PEN NEEDLE, DIABETIC 31 GAUGE X 3/16"
1.0000 [IU] | Freq: Every day | 3 refills | Status: AC
Start: 2019-11-25 — End: ?

## 2019-11-25 MED ORDER — METOPROLOL SUCCINATE ER 50 MG TABLET,EXTENDED RELEASE 24 HR
50.0000 mg | ORAL_TABLET | Freq: Every day | ORAL | 3 refills | Status: DC
Start: 2019-11-25 — End: 2020-03-19

## 2019-11-25 MED ORDER — FUROSEMIDE 20 MG TABLET
20.0000 mg | ORAL_TABLET | Freq: Every day | ORAL | 3 refills | Status: AC
Start: 2019-11-25 — End: ?

## 2019-11-25 MED ORDER — LISINOPRIL 5 MG TABLET
ORAL_TABLET | ORAL | 3 refills | Status: DC
Start: 2019-11-25 — End: 2020-03-19

## 2019-11-25 NOTE — Nursing Note (Signed)
Patient called and requested refills for medications.    Maye Hides, LPN  34/14/4360, 16:58

## 2019-12-02 ENCOUNTER — Telehealth (HOSPITAL_BASED_OUTPATIENT_CLINIC_OR_DEPARTMENT_OTHER): Payer: Self-pay | Admitting: Family Medicine

## 2019-12-02 NOTE — Telephone Encounter (Signed)
Patient's nebulizer broke this morning. She is needing a new order placed ASAP.       [name]  12/02/2019, 08:51

## 2019-12-04 ENCOUNTER — Encounter (HOSPITAL_BASED_OUTPATIENT_CLINIC_OR_DEPARTMENT_OTHER): Payer: Self-pay | Admitting: Family Medicine

## 2019-12-04 ENCOUNTER — Ambulatory Visit (INDEPENDENT_AMBULATORY_CARE_PROVIDER_SITE_OTHER): Payer: Medicaid Other | Admitting: Family Medicine

## 2019-12-04 ENCOUNTER — Other Ambulatory Visit: Payer: Self-pay

## 2019-12-04 VITALS — BP 150/76 | HR 96 | Temp 97.5°F | Resp 24 | Ht 63.0 in | Wt 189.2 lb

## 2019-12-04 DIAGNOSIS — I4811 Longstanding persistent atrial fibrillation: Secondary | ICD-10-CM

## 2019-12-04 DIAGNOSIS — Z23 Encounter for immunization: Secondary | ICD-10-CM

## 2019-12-04 DIAGNOSIS — Z6833 Body mass index (BMI) 33.0-33.9, adult: Secondary | ICD-10-CM

## 2019-12-04 DIAGNOSIS — J9611 Chronic respiratory failure with hypoxia: Secondary | ICD-10-CM

## 2019-12-04 DIAGNOSIS — Z794 Long term (current) use of insulin: Secondary | ICD-10-CM

## 2019-12-04 DIAGNOSIS — F17200 Nicotine dependence, unspecified, uncomplicated: Secondary | ICD-10-CM

## 2019-12-04 DIAGNOSIS — E669 Obesity, unspecified: Secondary | ICD-10-CM

## 2019-12-04 DIAGNOSIS — I152 Hypertension secondary to endocrine disorders: Secondary | ICD-10-CM

## 2019-12-04 DIAGNOSIS — I4891 Unspecified atrial fibrillation: Secondary | ICD-10-CM

## 2019-12-04 DIAGNOSIS — E119 Type 2 diabetes mellitus without complications: Secondary | ICD-10-CM

## 2019-12-04 DIAGNOSIS — J449 Chronic obstructive pulmonary disease, unspecified: Secondary | ICD-10-CM

## 2019-12-04 NOTE — Nursing Note (Signed)
Chief Complaint:   Chief Complaint            COPD         Functional Health Screen  Functional Health Screening:   Patient is under 18: No  Have you had a recent unexplained weight loss or gain?: No  Because we are aware of abuse and domestic violence today, we ask all patients: Are you being hurt, hit, or frightened by anyone at your home or in your life?: No  Do you have any basic needs within your home that are not being met? (such as Food, Shelter, Games developer, Transportation): No  Patient is under 18 and therefore has no Advance Directives: No  Patient has: No Advance  Patient has Advance Directive: No  Patient offered: Refused Packet  Screening unable to be completed: No       BP (!) 150/76    Pulse 96    Temp 36.4 C (97.5 F) (Oral)    Resp (!) 24    Ht 1.6 m (5' 3")    Wt 85.8 kg (189 lb 3.2 oz)    LMP  (LMP Unknown)    SpO2 93% Comment: 4L   BMI 33.52 kg/m       Social History     Tobacco Use   Smoking Status Current Every Day Smoker    Packs/day: 1.00    Years: 20.00    Pack years: 20.00    Types: Cigarettes   Smokeless Tobacco Never Used     Patient Health Rating           Depression Screening  PHQ Questionnaire     Allergies:  Allergies   Allergen Reactions    Codeine Nausea/ Vomiting     Sick to stomach    Hydrocodone Nausea/ Vomiting    Metformin Diarrhea    Oxycodone Nausea/ Vomiting     Medication History  Reviewed for OTC medication and any new medications, provider will review medication history  Results through Enter/Edit  No results found for this or any previous visit (from the past 24 hour(s)).  POCT Results  Care Team  Patient Care Team:  Hubbard Robinson, DO as PCP - General ([place])  Immunizations - last 24 hours     None        Fritz Pickerel, RN  12/04/2019, 13:03

## 2019-12-04 NOTE — Progress Notes (Signed)
[place], [place]  [place]  [place] [address]  Office Visit    ID: [name]   DOB: [birth year]  Date of Service: 12/04/2019     Chief Complaint(s):   Chief Complaint   Patient presents with    COPD       SUBJECTIVE:  Patient is a 64 y.o. female coming in for follow up of COPD. She is status post 1 month from having Covid and sts that her breathing has been gradually improving since being diagnosed. Pt notes that she is still on 4 liters of oxygen and has been on this for awhile now and is doing well with it. She sts that she would like to have a replacement nebulizer ordered so she can continue using this at home. Pt mentions that she has been smoking more recently due to stress at home and with her husband being in the hospital. She notes that she is currently smoking 1-2 packs every day. Pt mentions that her husband has been in the ICU for the past month due to Covid. She sts that she would like to discuss getting a walker with a seat to help improve her mobility. Pt notes that she has not been able to visit her husband due to her physical limitations and lack of mobility. She mentions that she is also under increased stress at home and sts that she recently moved her daughter, her son-in-law, and her grandchild into her house.     Pt would also like to follow up regarding her cardiology and pulmonology referrals. She sts that she was told the referrals were placed but nobody has reached out to her regarding appointments with either of these specialists. Pt notes that she has been snoring very badly at night but sts that she previously tested negative for sleep apnea. She mentions that she would like to work on getting these appointments scheduled.     ROS:  Constitutional: Denies fevers, chills, night sweats. No recent weight changes or fatigue.   Eyes: Denies change in vision. No irritation, erythema, discharge or pain.   Ears: Denies any difficulty with hearing. No ear pain,  tinnitus or discharge.   Mouth/Throat: Denies any oral ulcers or other lesions. No sore throat, hoarseness or dysphagia.  Cardiovascular: Denies any chest pain, palpitations, PND or DOE  Respiratory: Denies any shortness of breath, wheezing, cough   GI: Denies any abdominal pain, nausea, vomiting, diarrhea or constipation. No melena or hematochezia.  GU: Denies any dysuria, frequency, hematuria, hesitancy. Denies nocturia or incontinence.  Skin: Denies any recent rashes or lesions.     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       Obesity (BMI 35.0-39.9 without comorbidity) 10/08/2012     Medications:  Current Outpatient Medications   Medication Sig  ADVAIR DISKUS 500-50 mcg/dose Inhalation Disk with Device oral diskus inhaler Take 1 INHALATION by inhalation Twice daily INHALE 1 DOSE BY MOUTH TWICE DAILY    albuterol sulfate (PROAIR RESPICLICK) 90 mcg/actuation Inhalation Aerosol Powdr Breath Activated Take 180 mcg by inhalation Every 4 hours    albuterol sulfate (PROVENTIL) 2.5 mg /3 mL (0.083 %) Inhalation Solution for Nebulization USE 1 VIAL IN NEBULIZER 4 TIMES DAILY (Patient not taking: Reported on 12/04/2019 )    apixaban (ELIQUIS) 5 mg Oral Tablet Take 1 Tablet (5 mg total) by mouth Twice daily    atorvastatin (LIPITOR) 40 mg Oral Tablet Take 1 tablet by mouth once daily    flash glucose  scanning reader (FREESTYLE LIBRE 14 DAY READER) Does not apply Misc 1 Applicator by Does not apply route Once a day    flash glucose sensor (FREESTYLE LIBRE 14 DAY SENSOR) Does not apply Kit 1 Device by Does not apply route Once a day    furosemide (LASIX) 20 mg Oral Tablet Take 1 Tablet (20 mg total) by mouth Once a day    insulin glargine (LANTUS SOLOSTAR U-100 INSULIN) 100 unit/mL Subcutaneous Insulin Pen 30 Units by Subcutaneous route Twice daily (Patient taking differently: 30 Units by Subcutaneous route Once a day )    Insulin Needles, Disposable, (BD ULTRAFINE III SHORT PEN) 31 gauge x 3/16" Needle 1 Units Once a day    ipratropium-albuterol 0.5 mg-3 mg(2.5 mg base)/3 mL Solution for Nebulization 3 mL by Nebulization route Four times a day    lisinopriL (PRINIVIL) 5 mg Oral Tablet Take 1 tablet by mouth once daily    metoprolol succinate (TOPROL-XL) 50 mg Oral Tablet Sustained Release 24 hr Take 1 Tablet (50 mg total) by mouth Once a day for 90 days    ondansetron (ZOFRAN ODT) 4 mg Oral Tablet, Rapid Dissolve Take 1 Tablet (4 mg total) by mouth Every 8 hours as needed for Nausea/Vomiting (Patient not taking: Reported on 12/04/2019 )    oxygen (O2) Inhalation gas Take 3-4 L/min by inhalation continuous. concentrator and portable. May increase oxygen to 4 L NC with activity as needed for shortness of breath.   Indications: COPD with Chronic Bronchitis      Allergies:  Allergies   Allergen Reactions    Codeine Nausea/ Vomiting     Sick to stomach    Hydrocodone Nausea/ Vomiting    Metformin Diarrhea    Oxycodone Nausea/ Vomiting      Social History:  Social History     Tobacco Use    Smoking status: Current Every Day Smoker     Packs/day: 1.00     Years: 20.00     Pack years: 20.00     Types: Cigarettes    Smokeless tobacco: Never Used   Brewing technologist Use: Never used   Substance Use Topics    Alcohol use: No    Drug use: No      OBJECTIVE:  BP (!) 150/76    Pulse 96    Temp 36.4 C (97.5 F) (Oral)     Resp (!) 24    Ht 1.6 m (5' 3")    Wt 85.8 kg (189 lb 3.2 oz)    LMP  (LMP Unknown)    SpO2 93% Comment: 4L   BMI 33.52 kg/m     Physical Exam:  General: No apparent acute distress. Chronically ill appearing.   Eyes: Conjunctiva are clear. No discharge or icterus noted.  HENT:  Mucous membranes are moist. Nares are clear. Posterior oropharynx is clear without erythema or exudates.    Neck: Supple. Thyroid palpated as normal  Lungs: Distant but clear breath sounds. No wheezes or rhonchi. On 4 liters nasal cannula.   Cardiovascular: Normal rate and regular rhythm. No murmurs, rubs or gallops.  Abdomen: Soft. BS present. Non-tender. No rebound, guarding, or peritoneal signs.   Extremities: Atraumatic. No cyanosis. No significant peripheral edema.  Skin: Warm and dry. No significant rashes or lesions  Neurologic: Strength and sensation grossly normal throughout. Normal gait.    Psychiatric: Alert and oriented x 3. Affect within normal limits.    ASSESSMENT and PLAN:  1. Chronic obstructive pulmonary disease, unspecified COPD type (CMS HCC)    2. Longstanding persistent atrial fibrillation (CMS HCC)    3. Tobacco use disorder    4. Atrial fibrillation with rapid ventricular response (CMS HCC)    5. Chronic hypoxemic respiratory failure (CMS HCC)    6. Obesity (BMI 35.0-39.9 without comorbidity)    7. Type 2 diabetes mellitus without complication, with long-term current use of insulin (CMS HCC)    8. Hypertension due to endocrine disorder         COPD with hypoxia:  Currently on 4 liters of O2. Recently got over Covid 19 infection, no changes in medication today. Needs to get in with pulmonology for an appointment. Nebulizer broke, new nebulizer being prescribed today.     Diabetes:  A1c 7.5 at last visit. Continue low carb diet and exercise.     Limited mobility:  Seated walker prescribed to patient for better mobility due to end stage COPD.       I am scribing for, and in the presence of, Dr. Sabra Heck for services  provided on 12/04/2019.  Angel Riggin, SCRIBE   [place], [place]  12/04/2019, 13:53      Visit Diagnoses and associated Orders -- Summary:        ICD-10-CM    1. Chronic obstructive pulmonary disease, unspecified COPD type (CMS HCC)  J44.9 DME - WALKER Seated Walker   2. Longstanding persistent atrial fibrillation (CMS HCC)  I48.11    3. Tobacco use disorder  F17.200    4. Atrial fibrillation with rapid ventricular response (CMS HCC)  I48.91    5. Chronic hypoxemic respiratory failure (CMS HCC)  J96.11    6. Obesity (BMI 35.0-39.9 without comorbidity)  E66.9    7. Type 2 diabetes mellitus without complication, with long-term current use of insulin (CMS HCC)  E11.9     Z79.4    8. Hypertension due to endocrine disorder  I15.2        Portions of this note may be dictated using voice recognition software. Variances in spelling and vocabulary are possible and unintentional. Not all errors are caught/corrected. Please notify the [facility] if any discrepancies are noted or if the meaning of any statement is not clear.       I personally performed the services described in this documentation, as scribed  in my presence, and it is both accurate  and complete.    Hubbard Robinson, DO    Hubbard Robinson, DO  12/04/2019, 14:18

## 2019-12-14 ENCOUNTER — Other Ambulatory Visit (INDEPENDENT_AMBULATORY_CARE_PROVIDER_SITE_OTHER): Payer: Self-pay | Admitting: Family Medicine

## 2019-12-14 DIAGNOSIS — Z794 Long term (current) use of insulin: Secondary | ICD-10-CM

## 2019-12-24 ENCOUNTER — Telehealth (HOSPITAL_BASED_OUTPATIENT_CLINIC_OR_DEPARTMENT_OTHER): Payer: Self-pay | Admitting: Family Medicine

## 2019-12-24 NOTE — Telephone Encounter (Signed)
Patient called in tearful, stating she lost her husband on 12/18/2019 and is not doing well. She reports she is very anxious and on edge and is requesting medication for her nerves be sent to [facility].     Tristin Vandeusen-Fletcher, CMA

## 2019-12-27 ENCOUNTER — Telehealth (HOSPITAL_BASED_OUTPATIENT_CLINIC_OR_DEPARTMENT_OTHER): Payer: Self-pay | Admitting: Family Medicine

## 2019-12-27 MED ORDER — LORAZEPAM 0.5 MG TABLET
0.5000 mg | ORAL_TABLET | Freq: Every day | ORAL | 0 refills | Status: DC | PRN
Start: 2019-12-27 — End: 2020-01-29

## 2019-12-27 NOTE — Telephone Encounter (Signed)
[name] called in again requesting medication for her nerves as she states she is not doing well after the passing of her husband on 12/18/2019. She states she never heard back from anyone and is requesting this again.     An encounter was sent on 12/24/2019, but I do not see where it was responded to.     Please review and if appropriate send Rx to local pharmacy.     Mikeria would like a call back after this is addressed.     Thank you,     Sherene Sires, CMA

## 2019-12-27 NOTE — Nursing Note (Signed)
Called patient and informed her Dr. Sabra Heck sent a prescription for lorazepam to [facility].    Maye Hides, LPN  48/88/9169, 45:03

## 2019-12-27 NOTE — Addendum Note (Signed)
Addended by: Effie Berkshire on: 12/27/2019 01:24 PM     Modules accepted: Orders

## 2020-01-07 ENCOUNTER — Other Ambulatory Visit (HOSPITAL_BASED_OUTPATIENT_CLINIC_OR_DEPARTMENT_OTHER): Payer: Self-pay | Admitting: Family Medicine

## 2020-01-07 DIAGNOSIS — J431 Panlobular emphysema: Secondary | ICD-10-CM

## 2020-01-07 MED ORDER — IPRATROPIUM 0.5 MG-ALBUTEROL 3 MG (2.5 MG BASE)/3 ML NEBULIZATION SOLN
3.0000 mL | INHALATION_SOLUTION | Freq: Four times a day (QID) | RESPIRATORY_TRACT | 2 refills | Status: DC
Start: 2020-01-07 — End: 2020-01-21

## 2020-01-07 NOTE — Telephone Encounter (Signed)
Pt called requesting a refill on the Ipratropium-Albuterol 0.5-3mg . Pt stated if the rx can be sent to the [facility] in [place].       Kandy Garrison  01/07/2020, 09:34

## 2020-01-08 ENCOUNTER — Encounter (HOSPITAL_COMMUNITY): Payer: Self-pay

## 2020-01-08 ENCOUNTER — Telehealth: Payer: Medicaid Other | Admitting: Pulmonary Disease

## 2020-01-08 ENCOUNTER — Encounter (HOSPITAL_COMMUNITY): Payer: Self-pay | Admitting: Pulmonary Disease

## 2020-01-08 DIAGNOSIS — I5020 Unspecified systolic (congestive) heart failure: Secondary | ICD-10-CM

## 2020-01-08 DIAGNOSIS — J9611 Chronic respiratory failure with hypoxia: Secondary | ICD-10-CM

## 2020-01-08 DIAGNOSIS — J9612 Chronic respiratory failure with hypercapnia: Secondary | ICD-10-CM

## 2020-01-08 DIAGNOSIS — G4733 Obstructive sleep apnea (adult) (pediatric): Secondary | ICD-10-CM

## 2020-01-08 DIAGNOSIS — E0800 Diabetes mellitus due to underlying condition with hyperosmolarity without nonketotic hyperglycemic-hyperosmolar coma (NKHHC): Secondary | ICD-10-CM

## 2020-01-08 DIAGNOSIS — J449 Chronic obstructive pulmonary disease, unspecified: Secondary | ICD-10-CM

## 2020-01-08 DIAGNOSIS — I4891 Unspecified atrial fibrillation: Secondary | ICD-10-CM

## 2020-01-08 MED ORDER — FLUTICASONE 250 MCG-SALMETEROL 50 MCG/DOSE BLISTR POWDR FOR INHALATION
1.0000 | DISK | Freq: Two times a day (BID) | RESPIRATORY_TRACT | 5 refills | Status: AC
Start: 2020-01-08 — End: ?

## 2020-01-08 MED ORDER — UMECLIDINIUM 62.5 MCG/ACTUATION BLISTER POWDER FOR INHALATION
1.0000 | DISK | Freq: Every day | RESPIRATORY_TRACT | 5 refills | Status: DC
Start: 2020-01-08 — End: 2020-01-14

## 2020-01-08 NOTE — Progress Notes (Signed)
[facility]  [address]  [place] [address]    Video Visit     Name: [name]  MRN: [id number]    Date: 01/08/2020  Age: 64 y.o.                            Patient's location: Home - [place] [address]   Patient/family aware of provider location: Yes  Patient/family consent for video visit: Yes  Interview and observation performed by: Adron Bene, MD    Chief Complaint: COPD, Shortness of Breath, and Sleep Apnea    History of Present Illness:  [name] is a 64 y.o. female with 60+ pack year smoking, HFrEF, chronic hypoxic and hypercapnic respiratory fialure referred for evaluation and treatment of shortness of breath. This is a chronic issue.  She has been short of breath for at least 45 years.  However, this has gotten significantly worse for past 3-4 years.  She has had multiple ED visits for shortness of breath.  In addition she has occasional cough which is usually nonproductive, paroxysmal nocturnal dyspnea, orthopnea, and occasional lower extremity edema.  She reports no wheezing, excessive sputum production, or hemoptysis.  Her current pulmonary regimen includes Advair 500, DuoNeb, and albuterol.  She has taken 20 mg Lasix.  She is currently on 3 L oxygen.  She has not had any pulmonary function tests or sleep study.  Her most recent ABG shows chronic hypercapnia and hypoxia.   Past Medical History:  She has a past medical history of COPD (chronic obstructive pulmonary disease) (CMS HCC), Degenerative joint disease involving multiple joints, Diabetes mellitus (CMS HCC), HTN (hypertension), Smoker, Supplemental oxygen dependent, and Wears glasses.She has no past medical history of Cancer (CMS HCC), Congestive heart failure (CMS HCC), Convulsions (CMS HCC), CVA (cerebrovascular accident) (CMS [facility]), Thyroid disease, or Wears dentures.    Past Surgical History:  She has a past surgical history that includes hx carpal tunnel release.    Problem List:  She has Obesity  (BMI 35.0-39.9 without comorbidity); Chronic hypoxemic respiratory failure (CMS HCC); Diabetes (CMS HCC); Hypertension due to endocrine disorder; Non-compliance; Tobacco use disorder; Acute exacerbation of chronic obstructive pulmonary disease (COPD) (CMS HCC); Atrial fibrillation with rapid ventricular response (CMS HCC); COPD (chronic obstructive pulmonary disease) (CMS HCC); Atrial fibrillation (CMS HCC); and COVID-19 on their problem list.    Medications:    ProAir RespiClick    albuterol sulfate    apixaban    atorvastatin    FreeStyle Libre 14 Day Reader    FreeStyle Libre 14 Day Sensor    fluticasone propion-salmeteroL    furosemide    Lantus Solostar U-100 Insulin    Insulin Needles (Disposable)    ipratropium-albuteroL    lisinopriL    LORazepam    metoprolol succinate    ondansetron    oxygen    umeclidinium     Review of Systems:  Constitutional: positive for fatigue  Respiratory: positive for cough or dyspnea on exertion  Cardiovascular: positive for dyspnea, orthopnea, paroxysmal nocturnal dyspnea and lower extremity edema  Gastrointestinal: negative  Musculoskeletal:negative  Neurological: negative  Behavioral/Psych: positive for tobacco use  Endocrine: negative  All other ROS Negative      Observational Exam:   AAO x 3  Appears stated age   On oxygen   Speaks full sentence   Unlabored breathing       Assessment:  64 y/o female with  60+ pack year smoking referred for chronic shortness of breath. This is due to underlying cardiomyopathy as well COPD, which is likely severe.         ICD-10-CM    1. COPD (chronic obstructive pulmonary disease) (CMS HCC)  J44.9 PULMONARY FUNCTION TESTING     fluticasone propion-salmeteroL (ADVAIR) 250-50 mcg/dose Inhalation Disk with Device oral diskus inhaler     umeclidinium (INCRUSE ELLIPTA) 62.5 mcg/actuation Inhalation Disk with Device   2. Diabetes mellitus due to underlying condition with hyperosmolarity without coma, without long-term current use  of insulin (CMS HCC)  E08.00    3. Atrial fibrillation with rapid ventricular response (CMS HCC)  I48.91    4. OSA (obstructive sleep apnea)  G47.33    5. Chronic respiratory failure with hypoxia and hypercapnia (CMS HCC)  J96.11     J96.12    6. HFrEF (heart failure with reduced ejection fraction) (CMS HCC)  I50.20      Orders Placed This Encounter    PULMONARY FUNCTION TESTING    fluticasone propion-salmeteroL (ADVAIR) 250-50 mcg/dose Inhalation Disk with Device oral diskus inhaler    umeclidinium (INCRUSE ELLIPTA) 62.5 mcg/actuation Inhalation Disk with Device   Plan:  NIV for chronic hypoxic and hypercapnic respiratory failure as a result of COPD  Advair 250  Incruse   Full PFT   Low sodium diet   Albuterol   Oxygen at all time   Follow up in two months   Total times spent: 30 minutes       Marlis Edelson, MD

## 2020-01-09 ENCOUNTER — Other Ambulatory Visit: Payer: Self-pay

## 2020-01-13 ENCOUNTER — Ambulatory Visit
Admission: RE | Admit: 2020-01-13 | Discharge: 2020-01-13 | Disposition: A | Discharge status: Home / Self Care | Payer: Medicaid Other | Source: Ambulatory Visit | Attending: Pulmonary Disease | Admitting: Pulmonary Disease

## 2020-01-13 ENCOUNTER — Other Ambulatory Visit (INDEPENDENT_AMBULATORY_CARE_PROVIDER_SITE_OTHER): Payer: Self-pay | Admitting: Family Medicine

## 2020-01-13 ENCOUNTER — Telehealth (HOSPITAL_COMMUNITY): Payer: Self-pay | Admitting: Pulmonary Disease

## 2020-01-13 ENCOUNTER — Other Ambulatory Visit: Payer: Self-pay

## 2020-01-13 DIAGNOSIS — E119 Type 2 diabetes mellitus without complications: Secondary | ICD-10-CM

## 2020-01-13 DIAGNOSIS — Z794 Long term (current) use of insulin: Secondary | ICD-10-CM

## 2020-01-13 DIAGNOSIS — J449 Chronic obstructive pulmonary disease, unspecified: Secondary | ICD-10-CM

## 2020-01-13 NOTE — Telephone Encounter (Signed)
Insurance is not covering Incruse. The preferred is : Abel Presto or [employer]. [place], [place]  01/18/7586, 32:54

## 2020-01-14 ENCOUNTER — Other Ambulatory Visit (HOSPITAL_COMMUNITY): Payer: Self-pay | Admitting: Pulmonary Disease

## 2020-01-14 MED ORDER — SPIRIVA RESPIMAT 2.5 MCG/ACTUATION SOLUTION FOR INHALATION
2.0000 | Freq: Every day | RESPIRATORY_TRACT | 5 refills | Status: DC
Start: 2020-01-14 — End: 2020-01-21

## 2020-01-20 ENCOUNTER — Telehealth (HOSPITAL_COMMUNITY): Payer: Self-pay | Admitting: Pulmonary Disease

## 2020-01-20 NOTE — Telephone Encounter (Deleted)
Insurance is not covering Incruse. The preferred is : [employer] Presto or [employer]. [place], [place]  6/0/4540, 98:11          DB   12:00 PM  You routed this conversation to Noori, Rick Duff, MD

## 2020-01-21 ENCOUNTER — Other Ambulatory Visit (HOSPITAL_COMMUNITY): Payer: Self-pay | Admitting: Pulmonary Disease

## 2020-01-21 DIAGNOSIS — J449 Chronic obstructive pulmonary disease, unspecified: Secondary | ICD-10-CM

## 2020-01-21 MED ORDER — UMECLIDINIUM 62.5 MCG/ACTUATION BLISTER POWDER FOR INHALATION
1.0000 | DISK | Freq: Every day | RESPIRATORY_TRACT | 11 refills | Status: AC
Start: 2020-01-21 — End: ?

## 2020-01-21 MED ORDER — ALBUTEROL SULFATE 2.5 MG/3 ML (0.083 %) SOLUTION FOR NEBULIZATION
INHALATION_SOLUTION | RESPIRATORY_TRACT | 11 refills | Status: DC
Start: 2020-01-21 — End: 2020-03-10

## 2020-01-21 NOTE — Telephone Encounter (Signed)
Still receiving denial for Incruse per pharmacist it is because she is on duplicate therapy and rational drug therapy will not cover duoneb and Incruse. They will cover Incruse and Albuterol alone.  [place] Bjornstad, LPN  7/34/2876, 81:15

## 2020-01-29 ENCOUNTER — Other Ambulatory Visit (HOSPITAL_BASED_OUTPATIENT_CLINIC_OR_DEPARTMENT_OTHER): Payer: Self-pay | Admitting: Family Medicine

## 2020-01-29 NOTE — Telephone Encounter (Signed)
Pt called in wanting to have Lorazepam 0.5 mg sent in to her pharmacy if possible.     Kandy Garrison  01/29/2020, 10:09

## 2020-01-30 MED ORDER — LORAZEPAM 0.5 MG TABLET
0.5000 mg | ORAL_TABLET | Freq: Every day | ORAL | 0 refills | Status: DC | PRN
Start: 2020-01-30 — End: 2020-03-29

## 2020-03-04 ENCOUNTER — Telehealth (HOSPITAL_BASED_OUTPATIENT_CLINIC_OR_DEPARTMENT_OTHER): Payer: Self-pay | Admitting: Family Medicine

## 2020-03-04 NOTE — Telephone Encounter (Signed)
Pt called into office stating she does not like the Proair inhaler. She is requesting a different albuterol inhaler that she can "use more often". Explained to pt that she should be using a rescue inhaler more often than Q4h and pt stated she needs something she can use as much as she wants. Please advise.     [place], [place]  03/04/2020, 16:26

## 2020-03-04 NOTE — Telephone Encounter (Signed)
There is nothing she can use as much as she wants.  Needs appointment.    Hubbard Robinson, DO  03/04/2020, 20:39

## 2020-03-05 NOTE — Telephone Encounter (Signed)
Called patient and followed up with her request for different inhaler. She states that her current albuterol inhaler does not work as good as a previous one. She is unable to use her nebulizer more because the pulmonology prescription is only given for 2 week supply. Patient states she is not in active distress. Recommended calling her pulmonologist to discuss inhaler change and increased nebulizer dispense.    Maye Hides, LPN  02/09/4386, 87:57

## 2020-03-10 ENCOUNTER — Encounter (HOSPITAL_COMMUNITY): Payer: Self-pay

## 2020-03-10 ENCOUNTER — Other Ambulatory Visit (HOSPITAL_COMMUNITY): Payer: Self-pay | Admitting: Pulmonary Disease

## 2020-03-10 ENCOUNTER — Encounter (HOSPITAL_COMMUNITY): Payer: Self-pay | Admitting: Pulmonary Disease

## 2020-03-10 DIAGNOSIS — J449 Chronic obstructive pulmonary disease, unspecified: Secondary | ICD-10-CM

## 2020-03-10 MED ORDER — ALBUTEROL SULFATE 2.5 MG/3 ML (0.083 %) SOLUTION FOR NEBULIZATION
INHALATION_SOLUTION | RESPIRATORY_TRACT | 3 refills | Status: AC
Start: 2020-03-10 — End: ?

## 2020-03-13 ENCOUNTER — Encounter (HOSPITAL_COMMUNITY): Payer: Self-pay | Admitting: Pulmonary Disease

## 2020-03-19 ENCOUNTER — Other Ambulatory Visit (HOSPITAL_COMMUNITY): Payer: Self-pay | Admitting: Pulmonary Disease

## 2020-03-19 ENCOUNTER — Other Ambulatory Visit (HOSPITAL_BASED_OUTPATIENT_CLINIC_OR_DEPARTMENT_OTHER): Payer: Self-pay | Admitting: Family Medicine

## 2020-03-19 DIAGNOSIS — E119 Type 2 diabetes mellitus without complications: Secondary | ICD-10-CM

## 2020-03-19 DIAGNOSIS — I10 Essential (primary) hypertension: Secondary | ICD-10-CM

## 2020-03-19 DIAGNOSIS — I872 Venous insufficiency (chronic) (peripheral): Secondary | ICD-10-CM

## 2020-03-19 MED ORDER — VENTOLIN HFA 90 MCG/ACTUATION AEROSOL INHALER
1.0000 | INHALATION_SPRAY | Freq: Four times a day (QID) | RESPIRATORY_TRACT | 5 refills | Status: AC | PRN
Start: 2020-03-19 — End: ?

## 2020-03-19 NOTE — Telephone Encounter (Signed)
LOV 12/04/2019    Eugenio Hoes, CMA  03/19/2020, 10:01

## 2020-03-23 ENCOUNTER — Encounter (HOSPITAL_COMMUNITY): Payer: Self-pay | Admitting: Pulmonary Disease

## 2020-03-29 ENCOUNTER — Other Ambulatory Visit (HOSPITAL_BASED_OUTPATIENT_CLINIC_OR_DEPARTMENT_OTHER): Payer: Self-pay | Admitting: Family Medicine

## 2020-03-31 MED ORDER — LORAZEPAM 0.5 MG TABLET
0.5000 mg | ORAL_TABLET | Freq: Every day | ORAL | 0 refills | Status: AC | PRN
Start: 2020-03-31 — End: ?

## 2020-04-10 ENCOUNTER — Other Ambulatory Visit (HOSPITAL_BASED_OUTPATIENT_CLINIC_OR_DEPARTMENT_OTHER): Payer: Self-pay | Admitting: Family Medicine

## 2020-04-10 DIAGNOSIS — I872 Venous insufficiency (chronic) (peripheral): Secondary | ICD-10-CM

## 2020-04-10 DIAGNOSIS — E119 Type 2 diabetes mellitus without complications: Secondary | ICD-10-CM

## 2020-04-10 NOTE — Telephone Encounter (Signed)
Patient called in requesting her insulin pens be refilled and her Eliquis please

## 2020-04-12 MED ORDER — APIXABAN 5 MG TABLET
5.0000 mg | ORAL_TABLET | Freq: Two times a day (BID) | ORAL | 5 refills | Status: AC
Start: 2020-04-12 — End: ?

## 2020-04-12 MED ORDER — LANTUS SOLOSTAR U-100 INSULIN 100 UNIT/ML (3 ML) SUBCUTANEOUS PEN
PEN_INJECTOR | SUBCUTANEOUS | 5 refills | Status: DC
Start: 2020-04-12 — End: 2020-04-14

## 2020-04-14 ENCOUNTER — Other Ambulatory Visit (HOSPITAL_BASED_OUTPATIENT_CLINIC_OR_DEPARTMENT_OTHER): Payer: Self-pay | Admitting: Family Medicine

## 2020-04-14 ENCOUNTER — Telehealth (HOSPITAL_BASED_OUTPATIENT_CLINIC_OR_DEPARTMENT_OTHER): Payer: Self-pay | Admitting: Family Medicine

## 2020-04-14 DIAGNOSIS — E119 Type 2 diabetes mellitus without complications: Secondary | ICD-10-CM

## 2020-04-14 MED ORDER — LANTUS SOLOSTAR U-100 INSULIN 100 UNIT/ML (3 ML) SUBCUTANEOUS PEN
PEN_INJECTOR | SUBCUTANEOUS | 5 refills | Status: DC
Start: 2020-04-14 — End: 2020-04-15

## 2020-04-14 NOTE — Telephone Encounter (Signed)
Pt called and left a voicemail regarding her rx. Pt moved to [facility] and had forgot her rx. Pt is wanting to know if Dr. Sabra Heck can send a rx to the pharmacy at [facility] in [place]. Pt also would like a call back regarding this.     Kandy Garrison  04/14/2020, 13:17

## 2020-04-14 NOTE — Telephone Encounter (Signed)
Pt called and stated she left Lantus pens here before she moved, stated she wanted them sent to [facility] on [address] and she will have them transferred to a [facility] locally once she finds out which one it is    Purcell Nails, [place]  04/14/2020, 10:33

## 2020-04-15 MED ORDER — LANTUS SOLOSTAR U-100 INSULIN 100 UNIT/ML (3 ML) SUBCUTANEOUS PEN
PEN_INJECTOR | SUBCUTANEOUS | 0 refills | Status: AC
Start: 2020-04-15 — End: ?

## 2020-07-20 ENCOUNTER — Other Ambulatory Visit (HOSPITAL_BASED_OUTPATIENT_CLINIC_OR_DEPARTMENT_OTHER): Payer: Self-pay | Admitting: Family Medicine

## 2020-07-20 DIAGNOSIS — Z794 Long term (current) use of insulin: Secondary | ICD-10-CM

## 2020-07-21 ENCOUNTER — Encounter: Payer: Self-pay | Admitting: Internal Medicine

## 2020-08-06 ENCOUNTER — Other Ambulatory Visit: Payer: Self-pay

## 2020-08-06 DIAGNOSIS — I509 Heart failure, unspecified: Secondary | ICD-10-CM | POA: Insufficient documentation

## 2020-08-06 DIAGNOSIS — E119 Type 2 diabetes mellitus without complications: Secondary | ICD-10-CM | POA: Insufficient documentation

## 2020-08-06 DIAGNOSIS — I10 Essential (primary) hypertension: Secondary | ICD-10-CM | POA: Insufficient documentation

## 2020-08-06 DIAGNOSIS — L0390 Cellulitis, unspecified: Secondary | ICD-10-CM | POA: Insufficient documentation

## 2020-08-10 ENCOUNTER — Other Ambulatory Visit: Payer: Self-pay

## 2020-08-10 ENCOUNTER — Encounter: Payer: Self-pay | Admitting: Cardiology

## 2020-08-10 ENCOUNTER — Ambulatory Visit (INDEPENDENT_AMBULATORY_CARE_PROVIDER_SITE_OTHER): Payer: 59 | Admitting: Cardiology

## 2020-08-10 VITALS — BP 132/58 | HR 93 | Ht 62.0 in | Wt 186.0 lb

## 2020-08-10 DIAGNOSIS — E119 Type 2 diabetes mellitus without complications: Secondary | ICD-10-CM

## 2020-08-10 DIAGNOSIS — I480 Paroxysmal atrial fibrillation: Secondary | ICD-10-CM

## 2020-08-10 DIAGNOSIS — J9611 Chronic respiratory failure with hypoxia: Secondary | ICD-10-CM

## 2020-08-10 DIAGNOSIS — R0989 Other specified symptoms and signs involving the circulatory and respiratory systems: Secondary | ICD-10-CM

## 2020-08-10 DIAGNOSIS — R9431 Abnormal electrocardiogram [ECG] [EKG]: Secondary | ICD-10-CM

## 2020-08-10 DIAGNOSIS — I509 Heart failure, unspecified: Secondary | ICD-10-CM | POA: Diagnosis not present

## 2020-08-10 DIAGNOSIS — I10 Essential (primary) hypertension: Secondary | ICD-10-CM | POA: Diagnosis not present

## 2020-08-10 DIAGNOSIS — Z794 Long term (current) use of insulin: Secondary | ICD-10-CM

## 2020-08-10 HISTORY — DX: Morbid (severe) obesity due to excess calories: E66.01

## 2020-08-10 HISTORY — DX: Other specified symptoms and signs involving the circulatory and respiratory systems: R09.89

## 2020-08-10 HISTORY — DX: Chronic respiratory failure with hypoxia: J96.11

## 2020-08-10 HISTORY — DX: Paroxysmal atrial fibrillation: I48.0

## 2020-08-10 NOTE — Progress Notes (Addendum)
Cardiology Office Note:    Date:  08/10/2020   ID:  Ann Farrell, DOB 04/08/1955, MRN [id number]  PCP:  No primary care provider on file.  Cardiologist:  None  Electrophysiologist:  None   Referring MD: Verlon Au, MD   " I am here because of never seen a cardiologist before and since moving to [facility] I was asked to see 1"  History of Present Illness:    Ann Farrell is a 65 y.o. female with a hx of OSA, on CPAP, congestive heart failure she tells me few months ago when she was told she had heart failure she remembered that her EF was described as a 40%, she is on Eliquis but is not sure why she says that she was told her heart was out of rhythm and she also had clots, hypertension, type 2 diabetes on Lantus, chronic respiratory failure on oxygen at home, obesity, current smoker is here today to establish cardiac care.  The patient moved from [place] to [place] few months ago.  She was referred by her primary care doctor to cardiology given her history of congestive heart failure.  She denies any heart catheterization or any stress test in the past.  Of note she tells me she had a big hospitalization several months ago in [facility] and these records are not available to [place] at this time.  Today she denies any chest pain and tells me her shortness of breath is at baseline.  She denies any worsening shortness of breath.  She does have some bilateral leg edema which is also at her baseline she tells me.  Past Medical History:  Diagnosis Date   Cellulitis    CHF (congestive heart failure) (HCC)    COPD (chronic obstructive pulmonary disease) (HCC) 2020   HTN (hypertension)    T2DM (type 2 diabetes mellitus) (HCC)     Past Surgical History:  Procedure Laterality Date   BILATERAL CARPAL TUNNEL RELEASE      Current Medications: Current Meds  Medication Sig   ADVAIR DISKUS 250-50 MCG/ACT AEPB Inhale 1 puff into the lungs 2 (two) times  daily.   albuterol (VENTOLIN HFA) 108 (90 Base) MCG/ACT inhaler Inhale 2 puffs into the lungs every 6 (six) hours as needed for wheezing or shortness of breath.   atorvastatin (LIPITOR) 40 MG tablet Take 40 mg by mouth daily.   ELIQUIS 5 MG TABS tablet Take 5 mg by mouth 2 (two) times daily.   ENTRESTO 24-26 MG Take 1 tablet by mouth 2 (two) times daily.   furosemide (LASIX) 40 MG tablet Take 40 mg by mouth daily.   insulin glargine (LANTUS) 100 UNIT/ML Solostar Pen Inject 30 Units into the skin at bedtime.   JARDIANCE 10 MG TABS tablet Take 10 mg by mouth every morning.   metoprolol succinate (TOPROL-XL) 50 MG 24 hr tablet Take 50 mg by mouth daily.     Allergies:   Codeine and Metformin   Social History   Socioeconomic History   Marital status: Widowed    Spouse name: Not on file   Number of children: Not on file   Years of education: Not on file   Highest education level: Not on file  Occupational History   Not on file  Tobacco Use   Smoking status: Every Day    Packs/day: 1.00    Years: 47.00    Pack years: 47.00    Types: Cigarettes    Passive exposure: Past  Smokeless tobacco: Never  Vaping Use   Vaping Use: Never used  Substance and Sexual Activity   Alcohol use: Never   Drug use: Never   Sexual activity: Not on file  Other Topics Concern   Not on file  Social History Narrative   Not on file   Social Determinants of Health   Financial Resource Strain: Not on file  Food Insecurity: Not on file  Transportation Needs: Not on file  Physical Activity: Not on file  Stress: Not on file  Social Connections: Not on file     Family History: The patient's family history includes Alcoholism in her brother; COPD in her sister; COPD (age of onset: 51) in her mother; Heart attack in her father; Other in her brother.  ROS:   Review of Systems  Constitution: Negative for decreased appetite, fever and weight gain.  HENT: Negative for congestion, ear discharge, hoarse  voice and sore throat.   Eyes: Negative for discharge, redness, vision loss in right eye and visual halos.  Cardiovascular: Negative for chest pain, dyspnea on exertion, leg swelling, orthopnea and palpitations.  Respiratory: Negative for cough, hemoptysis, shortness of breath and snoring.   Endocrine: Negative for heat intolerance and polyphagia.  Hematologic/Lymphatic: Negative for bleeding problem. Does not bruise/bleed easily.  Skin: Negative for flushing, nail changes, rash and suspicious lesions.  Musculoskeletal: Negative for arthritis, joint pain, muscle cramps, myalgias, neck pain and stiffness.  Gastrointestinal: Negative for abdominal pain, bowel incontinence, diarrhea and excessive appetite.  Genitourinary: Negative for decreased libido, genital sores and incomplete emptying.  Neurological: Negative for brief paralysis, focal weakness, headaches and loss of balance.  Psychiatric/Behavioral: Negative for altered mental status, depression and suicidal ideas.  Allergic/Immunologic: Negative for HIV exposure and persistent infections.    EKGs/Labs/Other Studies Reviewed:    The following studies were reviewed today:   EKG:  The ekg ordered today demonstrates sinus rhythm, heart rate 90 bpm with nonspecific ST changes.  Recent Labs: No results found for requested labs within last 8760 hours.  Recent Lipid Panel No results found for: CHOL, TRIG, HDL, CHOLHDL, VLDL, LDLCALC, LDLDIRECT  Physical Exam:    VS:  BP (!) 132/58 (BP Location: Left Arm, Patient Position: Sitting)   Pulse 93   Ht 5\' 2"  (1.575 m)   Wt 186 lb (84.4 kg)   SpO2 96% Comment: 4 liters [place]  BMI 34.02 kg/m     Wt Readings from Last 3 Encounters:  08/10/20 186 lb (84.4 kg)     GEN: Well nourished, well developed in no acute distress HEENT: Normal NECK: No JVD; No carotid bruits LYMPHATICS: No lymphadenopathy CARDIAC: S1S2 noted,RRR, no murmurs, rubs, gallops RESPIRATORY:  Clear to auscultation without  rales, wheezing or rhonchi  ABDOMEN: Soft, non-tender, non-distended, +bowel sounds, no guarding. EXTREMITIES: No edema, No cyanosis, no clubbing MUSCULOSKELETAL:  No deformity  SKIN: Warm and dry NEUROLOGIC:  Alert and oriented x 3, non-focal PSYCHIATRIC:  Normal affect, good insight  ASSESSMENT:    1. Depressed left ventricular ejection fraction   2. Hypertension, unspecified type   3. Congestive heart failure, unspecified HF chronicity, unspecified heart failure type (HCC)   4. Chronic respiratory failure with hypoxia (HCC)   5. PAF (paroxysmal atrial fibrillation) (HCC)   6. Morbid obesity (HCC)   7. Insulin-requiring or dependent type II diabetes mellitus (HCC)   8. QT prolongation    PLAN:    Unfortunately the patient does not know much about her medical history she tells me she  just take her medication because it was given to her.  She is unclear the reasoning for her apixaban which she says could be for clot or maybe she was told she have A. fib.  1 thing she actually was almost hurting was the fact that her EF was 40%.  She was started on Entresto 24-26 mg twice a day which she is on currently we will continue this medication.  I would like to reassess her LV function to see if this has improved or the need for any additional guideline directed medications.  In the meantime we have had the patient fill out paperwork for [place] to request information from [facility] in [place] to understand more about her clinical picture.  She will need a close follow-up and she is opted to follow-up with one of my partners as I transition to our [employer].  She would like to see Dr. Tomie China.  The patient is in agreement with the above plan. The patient left the office in stable condition.  The patient will follow up in 3 months or sooner if needed.   Medication Adjustments/Labs and Tests Ordered: Current medicines are reviewed at length with the patient today.   Concerns regarding medicines are outlined above.  Orders Placed This Encounter  Procedures   Basic metabolic panel   Magnesium   EKG 12-Lead   ECHOCARDIOGRAM COMPLETE   No orders of the defined types were placed in this encounter.   Patient Instructions  Medication Instructions:  Your physician recommends that you continue on your current medications as directed. Please refer to the Current Medication list given to you today.  *If you need a refill on your cardiac medications before your next appointment, please call your pharmacy*   Lab Work: Your physician recommends that you return for lab work in: Today for BMP and Magnesium  If you have labs (blood work) drawn today and your tests are completely normal, you will receive your results only by: MyChart Message (if you have MyChart) OR A paper copy in the mail If you have any lab test that is abnormal or we need to change your treatment, we will call you to review the results.   Testing/Procedures: EKG Your physician has requested that you have an echocardiogram. Echocardiography is a painless test that uses sound waves to create images of your heart. It provides your doctor with information about the size and shape of your heart and how well your heart's chambers and valves are working. This procedure takes approximately one hour. There are no restrictions for this procedure.    Follow-Up: At [facility], you and your health needs are our priority.  As part of our continuing mission to provide you with exceptional heart care, we have created designated Provider Care Teams.  These Care Teams include your primary Cardiologist (physician) and Advanced Practice Providers (APPs -  Physician Assistants and Nurse Practitioners) who all work together to provide you with the care you need, when you need it.  We recommend signing up for the patient portal called "MyChart".  Sign up information is provided on this After Visit Summary.   MyChart is used to connect with patients for Virtual Visits (Telemedicine).  Patients are able to view lab/test results, encounter notes, upcoming appointments, etc.  Non-urgent messages can be sent to your provider as well.   To learn more about what you can do with MyChart, go to [email or web address].    Your next appointment:  3 month(s)  The format for your next appointment:   In Person  Provider:   Belva Cromeajan Revankar, MD   Other Instructions     Adopting a Healthy Lifestyle.  Know what a healthy weight is for you (roughly BMI <25) and aim to maintain this   Aim for 7+ servings of fruits and vegetables daily   65-80+ fluid ounces of water or unsweet tea for healthy kidneys   Limit to max 1 drink of alcohol per day; avoid smoking/tobacco   Limit animal fats in diet for cholesterol and heart health - choose grass fed whenever available   Avoid highly processed foods, and foods high in saturated/trans fats   Aim for low stress - take time to unwind and care for your mental health   Aim for 150 min of moderate intensity exercise weekly for heart health, and weights twice weekly for bone health   Aim for 7-9 hours of sleep daily   When it comes to diets, agreement about the perfect plan isnt easy to find, even among the experts. Experts at the [employer] developed an idea known as the Healthy Eating Plate. Just imagine a plate divided into logical, healthy portions.   The emphasis is on diet quality:   Load up on vegetables and fruits - one-half of your plate: Aim for color and variety, and remember that potatoes dont count.   Go for whole grains - one-quarter of your plate: Whole wheat, barley, wheat berries, quinoa, oats, brown rice, and foods made with them. If you want pasta, go with whole wheat pasta.   Protein power - one-quarter of your plate: Fish, chicken, beans, and nuts are all healthy, versatile protein sources. Limit red meat.   The  diet, however, does go beyond the plate, offering a few other suggestions.   Use healthy plant oils, such as olive, canola, soy, corn, sunflower and peanut. Check the labels, and avoid partially hydrogenated oil, which have unhealthy trans fats.   If youre thirsty, drink water. Coffee and tea are good in moderation, but skip sugary drinks and limit milk and dairy products to one or two daily servings.   The type of carbohydrate in the diet is more important than the amount. Some sources of carbohydrates, such as vegetables, fruits, whole grains, and beans-are healthier than others.   Finally, stay active  Signed, Thomasene RippleKardie Kymiah Araiza, DO  08/10/2020 4:15 PM    [facility]

## 2020-08-10 NOTE — Patient Instructions (Signed)
Medication Instructions:  Your physician recommends that you continue on your current medications as directed. Please refer to the Current Medication list given to you today.  *If you need a refill on your cardiac medications before your next appointment, please call your pharmacy*   Lab Work: Your physician recommends that you return for lab work in: Today for BMP and Magnesium  If you have labs (blood work) drawn today and your tests are completely normal, you will receive your results only by: MyChart Message (if you have MyChart) OR A paper copy in the mail If you have any lab test that is abnormal or we need to change your treatment, we will call you to review the results.   Testing/Procedures: EKG Your physician has requested that you have an echocardiogram. Echocardiography is a painless test that uses sound waves to create images of your heart. It provides your doctor with information about the size and shape of your heart and how well your heart's chambers and valves are working. This procedure takes approximately one hour. There are no restrictions for this procedure.    Follow-Up: At [facility], you and your health needs are our priority.  As part of our continuing mission to provide you with exceptional heart care, we have created designated Provider Care Teams.  These Care Teams include your primary Cardiologist (physician) and Advanced Practice Providers (APPs -  Physician Assistants and Nurse Practitioners) who all work together to provide you with the care you need, when you need it.  We recommend signing up for the patient portal called "MyChart".  Sign up information is provided on this After Visit Summary.  MyChart is used to connect with patients for Virtual Visits (Telemedicine).  Patients are able to view lab/test results, encounter notes, upcoming appointments, etc.  Non-urgent messages can be sent to your provider as well.   To learn more about what you can do  with MyChart, go to [email or web address].    Your next appointment:   3 month(s)  The format for your next appointment:   In Person  Provider:   Belva Crome, MD   Other Instructions

## 2020-08-11 LAB — BASIC METABOLIC PANEL
BUN/Creatinine Ratio: 13 (ref 12–28)
BUN: 10 mg/dL (ref 8–27)
CO2: 28 mmol/L (ref 20–29)
Calcium: 9.2 mg/dL (ref 8.7–10.3)
Chloride: 101 mmol/L (ref 96–106)
Creatinine, Ser: 0.77 mg/dL (ref 0.57–1.00)
Glucose: 258 mg/dL — ABNORMAL HIGH (ref 65–99)
Potassium: 3.9 mmol/L (ref 3.5–5.2)
Sodium: 144 mmol/L (ref 134–144)
eGFR: 87 mL/min/{1.73_m2} (ref >59)

## 2020-08-11 LAB — MAGNESIUM: Magnesium: 2 mg/dL (ref 1.6–2.3)

## 2020-08-12 ENCOUNTER — Telehealth: Payer: Self-pay

## 2020-08-12 NOTE — Telephone Encounter (Signed)
Patient notified of results.

## 2020-08-12 NOTE — Telephone Encounter (Signed)
-----   Message from [name], DO sent at 08/12/2020  2:20 PM EDT ----- Elevated blood glucose but otherwise stable labs.

## 2020-08-14 ENCOUNTER — Encounter: Payer: Self-pay | Admitting: Internal Medicine

## 2020-08-14 ENCOUNTER — Other Ambulatory Visit: Payer: Self-pay

## 2020-08-14 ENCOUNTER — Ambulatory Visit (INDEPENDENT_AMBULATORY_CARE_PROVIDER_SITE_OTHER): Payer: 59 | Admitting: Internal Medicine

## 2020-08-14 ENCOUNTER — Ambulatory Visit (INDEPENDENT_AMBULATORY_CARE_PROVIDER_SITE_OTHER): Payer: 59

## 2020-08-14 DIAGNOSIS — J449 Chronic obstructive pulmonary disease, unspecified: Secondary | ICD-10-CM

## 2020-08-14 DIAGNOSIS — F17210 Nicotine dependence, cigarettes, uncomplicated: Secondary | ICD-10-CM | POA: Insufficient documentation

## 2020-08-14 DIAGNOSIS — J9611 Chronic respiratory failure with hypoxia: Secondary | ICD-10-CM | POA: Diagnosis not present

## 2020-08-14 HISTORY — DX: Nicotine dependence, cigarettes, uncomplicated: F17.210

## 2020-08-14 LAB — CBC WITH DIFFERENTIAL/PLATELET
Basophils Absolute: 0 10*3/uL (ref 0.0–0.1)
Basophils Relative: 0.4 % (ref 0.0–3.0)
Eosinophils Absolute: 0.2 10*3/uL (ref 0.0–0.7)
Eosinophils Relative: 1.8 % (ref 0.0–5.0)
HCT: 39.6 % (ref 36.0–46.0)
Hemoglobin: 13.4 g/dL (ref 12.0–15.0)
Lymphocytes Relative: 27.2 % (ref 12.0–46.0)
Lymphs Abs: 2.8 10*3/uL (ref 0.7–4.0)
MCHC: 33.9 g/dL (ref 30.0–36.0)
MCV: 91.7 fl (ref 78.0–100.0)
Monocytes Absolute: 0.9 10*3/uL (ref 0.1–1.0)
Monocytes Relative: 9.1 % (ref 3.0–12.0)
Neutro Abs: 6.4 10*3/uL (ref 1.4–7.7)
Neutrophils Relative %: 61.5 % (ref 43.0–77.0)
Platelets: 305 10*3/uL (ref 150.0–400.0)
RBC: 4.32 Mil/uL (ref 3.87–5.11)
RDW: 14.5 % (ref 11.5–15.5)
WBC: 10.4 10*3/uL (ref 4.0–10.5)

## 2020-08-14 NOTE — Assessment & Plan Note (Addendum)
Active smoker - Symptom onset 2012  - Labs ordered 08/14/2020  :  allergy profile   alpha one AT phenotype    - The proper method of use, as well as anticipated side effects, of a metered-dose inhaler were discussed and demonstrated to the patient using teach back method for both hfa and dpi   Pt appears to have significant AB / actively smoking and good candidate for trelegy but just purchased advair x 3 m so rec return in 6-8 weeks with pft's and decide then re triple rx

## 2020-08-14 NOTE — Progress Notes (Signed)
Ann Farrell, female    DOB: December 30, 1955,    MRN: [id number]   Brief patient profile:  65 yo  active smoker   from [facility]  referred to pulmonary clinic 08/14/2020 by Dr   Payton Emerald re copd/02 dep x 2012 but uses only uses it prn     History of Present Illness  08/14/2020  Pulmonary/ 1st office eval/Ishana Blades s/p HP admit maint on advair  Chief Complaint  Patient presents with   Consult    Consult for COPD. Has been using oxygen for about 12 years.   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   Sleeping now  without nocturnal  or early am exacerbation  of respiratory  c/o's or need for noct saba. Also denies any obvious fluctuation of symptoms with weather or environmental changes or other aggravating or alleviating factors except as outlined above   No unusual exposure hx or h/o childhood pna/ asthma or knowledge of premature birth.  Current Allergies, Complete Past Medical History, Past Surgical History, Family History, and Social History were reviewed in [employer] record.  ROS  The following are not active complaints unless bolded Hoarseness, sore throat, dysphagia, dental problems, itching, sneezing,  nasal congestion or discharge of excess mucus or purulent secretions, ear ache,   fever, chills, sweats, unintended wt loss or wt gain, classically pleuritic or exertional cp,  orthopnea pnd or arm/hand swelling  or leg swelling, presyncope, palpitations, abdominal pain, anorexia, nausea, vomiting, diarrhea  or change in bowel habits or change in bladder habits, change in stools or change in urine, dysuria, hematuria,  rash, arthralgias, visual complaints, headache,  numbness, weakness or ataxia or problems with walking or coordination,  change in mood or  memory.           Past Medical History:  Diagnosis Date   Cellulitis    CHF (congestive heart failure) (HCC)    COPD (chronic obstructive pulmonary disease) (HCC) 2020   HTN (hypertension)    T2DM (type 2 diabetes mellitus) (HCC)     Outpatient Medications Prior to Visit  Medication Sig Dispense Refill   ADVAIR DISKUS 250-50 MCG/ACT AEPB Inhale 1 puff into the lungs 2 (two) times daily.     albuterol (VENTOLIN HFA) 108 (90 Base) MCG/ACT inhaler Inhale 2 puffs into the lungs every 6 (six) hours as needed for wheezing or shortness of breath.     atorvastatin (LIPITOR) 40 MG tablet Take 40 mg by mouth daily.     ELIQUIS 5 MG TABS tablet Take 5 mg by mouth 2 (two) times daily.     ENTRESTO 24-26 MG Take 1 tablet by mouth 2 (two) times daily.     furosemide (LASIX) 40 MG tablet Take 40 mg by mouth daily.     insulin glargine (LANTUS) 100 UNIT/ML Solostar Pen Inject 30 Units into the skin at bedtime.     JARDIANCE 10 MG TABS tablet Take 10 mg by mouth every morning.     metoprolol succinate (TOPROL-XL) 50 MG 24 hr tablet Take 50 mg by mouth daily.     No facility-administered medications prior to visit.     Objective:  BP (!) 142/80 (BP Location: Left Arm, Patient Position: Sitting, Cuff Size: Normal)   Pulse 87   Temp 98.1 F (36.7 C) (Oral)   Wt 184 lb 6.4 oz (83.6 kg)   SpO2 100%   BMI 33.73 kg/m   SpO2: 100 % O2 Type: Pulse O2 O2 Flow Rate (L/min): 3 L/min   HEENT : pt wearing mask not removed for exam due to covid - 19 concerns.    NECK :  without JVD/Nodes/TM/ nl carotid upstrokes bilaterally   LUNGS: no acc muscle use,  Mild barrel  contour chest wall with bilateral  Distant bs s audible wheeze and  without cough on insp or exp maneuvers  and mild  Hyperresonant  to  percussion bilaterally     CV:  RRR  no s3 or murmur or increase in P2, and no edema   ABD:  soft and  nontender with pos end  insp Hoover's  in the supine position. No bruits or organomegaly appreciated, bowel sounds nl  MS:   Nl gait/  ext warm without deformities, calf tenderness, cyanosis or clubbing No obvious joint restrictions   SKIN: warm and dry without lesions    NEURO:  alert, approp, nl sensorium with  no motor or cerebellar deficits apparent.      CXR PA and Lateral:   08/14/2020 :    I personally reviewed images/impression as follows:    Cardiomegaly/ copd with increased markings R base on lateral not seen on PA view  Labs ordered 08/14/2020  :  allergy profile   alpha one AT phenotype       Assessment   Chronic respiratory failure with hypoxia (HCC) Placed on 02 in 2012  - 08/14/2020   Walked on 4lpm  x one lap =  approx 250 ft -@ slow pace stopped due to leg pain/ fatigue and sob with sats 91% at lowest      Rec: Make sure you check your oxygen saturation  at your highest level of activity  to be sure it stays over 90% and adjust  02 flow upward to maintain this level if needed but remember to turn it back to previous settings when you stop (to conserve your supply).                  COPD GOLD  ? /  smoker  Active smoker - Symptom onset 2012  - Labs ordered 08/14/2020  :  allergy profile   alpha one AT phenotype    - The proper method of use, as well as anticipated side effects, of a metered-dose inhaler were discussed and demonstrated to the patient using teach back method for both hfa and dpi   Pt appears to have significant AB / actively smoking and good candidate for trelegy but just purchased advair x 3 m so rec return in 6-8 weeks with pft's and decide then re triple rx   Each maintenance medication was reviewed in detail including emphasizing most importantly the difference between maintenance and prns and under what circumstances the prns are to be triggered using an action plan format where appropriate.  Total time for H and P, chart review, counseling,  reviewing hfa/dpi/02 device(s) , directly observing portions of ambulatory 02 saturation study/ and generating customized AVS unique to this office visit / same day charting = 50 min      Cigarette smoker 4-5 min discussion re active cigarette smoking in addition to office E&M  Ask about tobacco use:  Ongoing  Advise quitting   I emphasized that although we never turn away smokers from the pulmonary clinic, we do ask that they understand that the recommendations that we make  won't work nearly as well in the presence of continued cigarette exposure. In fact, we may very well  reach a point where we can't promise to help the patient if he/she can't quit smoking. (We can and will promise to try to help, we just can't promise what we recommend will really work)  Assess willingness:  Not fully committed at this point Assist in quit attempt:  Per PCP when ready Arrange follow up:   Follow up per Primary Care planned             Sandrea Hughs, MD 08/14/2020

## 2020-08-14 NOTE — Assessment & Plan Note (Signed)
4-5 min discussion re active cigarette smoking in addition to office E&M  Ask about tobacco use:   Ongoing  Advise quitting   I emphasized that although we never turn away smokers from the pulmonary clinic, we do ask that they understand that the recommendations that we make  won't work nearly as well in the presence of continued cigarette exposure. In fact, we may very well  reach a point where we can't promise to help the patient if he/she can't quit smoking. (We can and will promise to try to help, we just can't promise what we recommend will really work)  Assess willingness:  Not fully committed at this point Assist in quit attempt:  Per PCP when ready Arrange follow up:   Follow up per Primary Care planned

## 2020-08-14 NOTE — Assessment & Plan Note (Addendum)
Placed on 02 in 2012  - 08/14/2020   Walked on 4lpm  x one lap =  approx 250 ft -@ slow pace stopped due to leg pain/ fatigue and sob with sats 91% at lowest      Rec: Make sure you check your oxygen saturation  at your highest level of activity  to be sure it stays over 90% and adjust  02 flow upward to maintain this level if needed but remember to turn it back to previous settings when you stop (to conserve your supply).   Each maintenance medication was reviewed in detail including emphasizing most importantly the difference between maintenance and prns and under what circumstances the prns are to be triggered using an action plan format where appropriate.  Total time for H and P, chart review, counseling, reviewing hfa/dpi/02 device(s) , directly observing portions of ambulatory 02 saturation study/ and generating customized AVS unique to this office visit / same day charting = 50 min

## 2020-08-14 NOTE — Patient Instructions (Addendum)
Plan A = Automatic = Always=    advair 250 one twice daily   Plan B = Backup (to supplement plan A, not to replace it) Only use your albuterol inhaler as a rescue medication to be used if you can't catch your breath by resting or doing a relaxed purse lip breathing pattern.  - The less you use it, the better it will work when you need it. - Ok to use the inhaler up to 2 puffs every 4 hours if you must but call for appointment if use goes up over your usual need - Don't leave home without it !!  (think of it like starter fluid nd spare tire for your car)   Plan C = Crisis (instead of Plan B but only if Plan B stops working) - only use your albuterol nebulizer if you first try Plan B and it fails to help > ok to use the nebulizer up to every 4 hours but if start needing it regularly call for immediate appointment  Ok to Try albuterol 15 min before an activity (on alternating days)  that you know would make you short of breath and see if it makes any difference and if makes none then don't take albuterol after activity unless you can't catch your breath as this means it's the resting that helps, not the albuterol.      The key is to stop smoking completely before smoking completely stops you!  Make sure you check your oxygen saturation  at your highest level of activity  to be sure it stays over 90% and adjust  02 flow upward to maintain this level if needed but remember to turn it back to previous settings when you stop (to conserve your supply).   Please remember to go to the lab and x-ray department  for your tests - we will call you with the results when they are available.       Please schedule a follow up office visit in 6 weeks,   PFTs on return

## 2020-08-17 ENCOUNTER — Ambulatory Visit (HOSPITAL_BASED_OUTPATIENT_CLINIC_OR_DEPARTMENT_OTHER)
Admission: RE | Admit: 2020-08-17 | Discharge: 2020-08-17 | Disposition: A | Discharge status: Home / Self Care | Payer: 59 | Source: Ambulatory Visit | Attending: Cardiology | Admitting: Cardiology

## 2020-08-17 ENCOUNTER — Encounter: Payer: Self-pay | Admitting: *Deleted

## 2020-08-17 ENCOUNTER — Other Ambulatory Visit: Payer: Self-pay

## 2020-08-17 DIAGNOSIS — I509 Heart failure, unspecified: Secondary | ICD-10-CM | POA: Diagnosis present

## 2020-08-17 DIAGNOSIS — R0989 Other specified symptoms and signs involving the circulatory and respiratory systems: Secondary | ICD-10-CM | POA: Diagnosis not present

## 2020-08-17 DIAGNOSIS — I10 Essential (primary) hypertension: Secondary | ICD-10-CM | POA: Insufficient documentation

## 2020-08-17 LAB — ECHOCARDIOGRAM COMPLETE
AR max vel: 3.35 cm2
AV Area VTI: 2.87 cm2
AV Area mean vel: 3.38 cm2
AV Mean grad: 2 mmHg
AV Peak grad: 4.2 mmHg
Ao pk vel: 1.03 m/s
Area-P 1/2: 6.43 cm2
Calc EF: 32.1 %
S' Lateral: 3.61 cm
Single Plane A2C EF: 24.9 %
Single Plane A4C EF: 40.2 %

## 2020-08-17 LAB — IGE: IgE (Immunoglobulin E), Serum: 55 kU/L (ref <=114)

## 2020-08-17 LAB — ALPHA-1-ANTITRYPSIN: A-1 Antitrypsin, Ser: 199 mg/dL (ref 83–199)

## 2020-08-17 NOTE — Progress Notes (Signed)
*  PRELIMINARY RESULTS* Echocardiogram 2D Echocardiogram has been performed.  Neomia Dear RDCS 08/17/2020, 11:57 AM

## 2020-08-18 ENCOUNTER — Encounter: Payer: Self-pay | Admitting: *Deleted

## 2020-08-21 ENCOUNTER — Encounter (HOSPITAL_COMMUNITY): Payer: Self-pay | Admitting: Pulmonary Disease

## 2020-08-28 ENCOUNTER — Other Ambulatory Visit (HOSPITAL_COMMUNITY): Payer: 59

## 2020-10-06 ENCOUNTER — Other Ambulatory Visit: Payer: Self-pay | Admitting: *Deleted

## 2020-10-06 DIAGNOSIS — J449 Chronic obstructive pulmonary disease, unspecified: Secondary | ICD-10-CM

## 2020-10-07 ENCOUNTER — Encounter: Payer: Self-pay | Admitting: Internal Medicine

## 2020-10-07 ENCOUNTER — Other Ambulatory Visit: Payer: Self-pay

## 2020-10-07 ENCOUNTER — Ambulatory Visit (INDEPENDENT_AMBULATORY_CARE_PROVIDER_SITE_OTHER): Payer: 59 | Admitting: Internal Medicine

## 2020-10-07 DIAGNOSIS — J9611 Chronic respiratory failure with hypoxia: Secondary | ICD-10-CM | POA: Diagnosis not present

## 2020-10-07 DIAGNOSIS — F17210 Nicotine dependence, cigarettes, uncomplicated: Secondary | ICD-10-CM | POA: Diagnosis not present

## 2020-10-07 DIAGNOSIS — J449 Chronic obstructive pulmonary disease, unspecified: Secondary | ICD-10-CM | POA: Diagnosis not present

## 2020-10-07 LAB — PULMONARY FUNCTION TEST
DL/VA % pred: 79 %
DL/VA: 3.36 ml/min/mmHg/L
DLCO cor % pred: 61 %
DLCO cor: 11.4 ml/min/mmHg
DLCO unc % pred: 61 %
DLCO unc: 11.4 ml/min/mmHg
FEF 25-75 Post: 0.99 L/sec
FEF 25-75 Pre: 0.7 L/sec
FEF2575-%Change-Post: 41 %
FEF2575-%Pred-Post: 48 %
FEF2575-%Pred-Pre: 33 %
FEV1-%Change-Post: 7 %
FEV1-%Pred-Post: 52 %
FEV1-%Pred-Pre: 48 %
FEV1-Post: 1.19 L
FEV1-Pre: 1.11 L
FEV1FVC-%Change-Post: -1 %
FEV1FVC-%Pred-Pre: 91 %
FEV6-%Change-Post: 9 %
FEV6-%Pred-Post: 60 %
FEV6-%Pred-Pre: 55 %
FEV6-Post: 1.71 L
FEV6-Pre: 1.57 L
FEV6FVC-%Pred-Post: 104 %
FEV6FVC-%Pred-Pre: 104 %
FVC-%Change-Post: 9 %
FVC-%Pred-Post: 58 %
FVC-%Pred-Pre: 53 %
FVC-Post: 1.71 L
FVC-Pre: 1.57 L
Post FEV1/FVC ratio: 70 %
Post FEV6/FVC ratio: 100 %
Pre FEV1/FVC ratio: 71 %
Pre FEV6/FVC Ratio: 100 %
RV % pred: 184 %
RV: 3.64 L
TLC % pred: 111 %
TLC: 5.29 L

## 2020-10-07 NOTE — Progress Notes (Signed)
Ann Farrell, female    DOB: 1955-12-01,    MRN: [id number]   Brief patient profile:  58 yowf active smoker  from [facility]  referred to pulmonary clinic 08/14/2020 by Dr   Payton Emerald re copd/02 dep x 2012 but uses only uses it prn     History of Present Illness  08/14/2020  Pulmonary/ 1st office eval/Ann Farrell s/p HP admit maint on advair  Chief Complaint  Patient presents with   Consult    Consult for COPD. Has been using oxygen for about 12 years.   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  Cough: scratchy throat  Sleep: on R side almost flat in recliner  SABA use: maybe once a day  Rec Plan A = Automatic = Always=    advair 250 one twice daily  Plan B = Backup (to supplement plan A, not to replace it) Only use your albuterol inhaler as a rescue medication  Plan C = Crisis (instead of Plan B but only if Plan B stops working) - only use your albuterol nebulizer if you first try Plan B and it fails to help > ok to use the nebulizer up to every 4 hours but if start needing it regularly call for immediate appointment Ok to Try albuterol 15 min before an activity (on alternating days)  that you know would make you short of breath The key is to stop smoking completely before smoking completely stops you! 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   Sleeping as above  without nocturnal  or early am exacerbation  of respiratory c/o's or need for noct saba. Also denies any obvious fluctuation of symptoms with weather or environmental changes or other aggravating or alleviating factors except as outlined above   No unusual exposure hx or h/o childhood pna/ asthma or knowledge of premature birth.  Current Allergies, Complete Past Medical History, Past Surgical History, Family History, and Social History were reviewed in [employer] record.  ROS  The following are not active complaints unless bolded Hoarseness, sore throat, dysphagia, dental problems, itching, sneezing,  nasal congestion or discharge of excess mucus or purulent secretions, ear ache,   fever, chills, sweats, unintended wt loss or wt gain, classically pleuritic or exertional cp,  orthopnea pnd or arm/hand swelling  or leg swelling, presyncope, palpitations, abdominal pain, anorexia, nausea, vomiting, diarrhea  or change in bowel habits or change in bladder habits, change in stools or change in urine, dysuria, hematuria,  rash, arthralgias, visual complaints, headache, numbness, weakness or ataxia or problems  with walking or coordination,  change in mood or  memory.        Current Meds  Medication Sig   ADVAIR DISKUS 250-50 MCG/ACT AEPB Inhale 1 puff into the lungs 2 (two) times daily.   albuterol (VENTOLIN HFA) 108 (90 Base) MCG/ACT inhaler Inhale 2 puffs into the lungs every 6 (six) hours as needed for wheezing or shortness of breath.   atorvastatin (LIPITOR) 40 MG tablet Take 40 mg by mouth daily.   ELIQUIS 5 MG TABS tablet Take 5 mg by mouth 2 (two) times daily.   ENTRESTO 24-26 MG Take 1 tablet by mouth 2 (two) times daily.   furosemide (LASIX) 40 MG tablet Take 40 mg by mouth daily.   insulin glargine (LANTUS) 100 UNIT/ML Solostar Pen  Inject 30 Units into the skin at bedtime.   JARDIANCE 10 MG TABS tablet Take 10 mg by mouth every morning.   metoprolol succinate (TOPROL-XL) 50 MG 24 hr tablet Take 50 mg by mouth daily.                    Past Medical History:  Diagnosis Date   Cellulitis    CHF (congestive heart failure) (HCC)    COPD (chronic obstructive pulmonary disease) (HCC) 2020   HTN (hypertension)    T2DM (type 2 diabetes mellitus) (HCC)         Objective:       Wt Readings from Last 3 Encounters:  10/07/20 190 lb (86.2 kg)  08/14/20 184 lb 6.4 oz (83.6 kg)  08/10/20 186 lb (84.4 kg)      Vital signs reviewed  10/07/2020  - Note at rest 02 sats  95% on 4 lpm   General appearance:    pleasant amb wf, smoker's rattle     HEENT : pt wearing mask not removed for exam due to covid - 19 concerns.    NECK :  without JVD/Nodes/TM/ nl carotid upstrokes bilaterally   LUNGS: no acc muscle use,  Mild barrel  contour chest wall with bilateral  Distant bs s audible wheeze and  without cough on insp or exp maneuvers  and mild  Hyperresonant  to  percussion bilaterally     CV:  RRR  no s3 or murmur or increase in P2, and no edema   ABD:  soft and nontender with pos end  insp Hoover's  in the supine position. No bruits or organomegaly appreciated, bowel sounds nl  MS:   Nl gait/  ext warm without deformities, calf tenderness, cyanosis or clubbing No obvious joint restrictions   SKIN: warm and dry without lesions    NEURO:  alert, approp, nl sensorium with  no motor or cerebellar deficits apparent.         Assessment

## 2020-10-07 NOTE — Assessment & Plan Note (Signed)
Placed on 02 in 2012  - 08/14/2020   Walked on 4lpm  x one lap =  approx 250 ft -@ slow pace stopped due to leg pain/ fatigue and sob with sats 91% at lowest    - 10/07/2020   Walked on 4lpm pulsed  x 1/2 lap(s) =  approx 125 ft @ moderate pace, stopped due to sob  with lowest 02 sats 88%   As of 10/07/2020  Rec: Make sure you check your oxygen saturation  at your highest level of activity  to be sure it stays over 90% and adjust  02 flow upward to maintain this level or walk slower  if needed but remember to turn it back to previous settings when you stop (to conserve your supply).

## 2020-10-07 NOTE — Progress Notes (Signed)
Full PFT performed today. °

## 2020-10-07 NOTE — Assessment & Plan Note (Signed)
Active smoker - Symptom onset 2012  - Labs ordered 08/14/2020  :  allergy profile 0.2/ IgE 55    alpha one AT level 199  - PFT's  10/07/2020  FEV1 1.19 (52 % ) ratio 0.40  p 7 % improvement from saba p advair 250 prior to study with DLCO  11.40 (61%) corrects to 3.36 (79%)  for alv volume and FV curve mildly concave    She is more AB than classic copd so advair 250 reasonable and encouraged to stop smoking (see separate a/p)   No change in Rx/ contingencies discussed

## 2020-10-07 NOTE — Patient Instructions (Addendum)
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

## 2020-10-07 NOTE — Assessment & Plan Note (Addendum)
Counseled re importance of smoking cessation but did not meet time criteria for separate billing    Low-dose CT lung cancer screening is recommended for patients who are 39-64 years of age with a 20+ pack-year history of smoking, and who are currently smoking or quit <=15 years ago >> referred for shared decision making   Each maintenance medication was reviewed in detail including emphasizing most importantly the difference between maintenance and prns and under what circumstances the prns are to be triggered using an action plan format where appropriate.  Total time for H and P, chart review, counseling, reviewing dpi, hfa/ neb device(s) , directly observing portions of ambulatory 02 saturation study/ and generating customized AVS unique to this office visit / same day charting =  32 min

## 2020-10-07 NOTE — Patient Instructions (Signed)
Full PFT performed today. °

## 2020-10-08 ENCOUNTER — Telehealth: Payer: Self-pay | Admitting: *Deleted

## 2020-10-08 NOTE — Telephone Encounter (Signed)
Spoke with the pt and notified her of response per Dr Sherene Sires  She verbalized understanding  Nothing further needed

## 2020-10-08 NOTE — Telephone Encounter (Signed)
-----   Message from Nyoka Cowden, MD sent at 10/07/2020 11:56 AM EDT ----- Let her know it's ok to increase 02 with walking to adjust there 02 flow so her sats stay above 90% as long as remembers to turn it back down at rest

## 2020-10-28 ENCOUNTER — Other Ambulatory Visit: Payer: Self-pay

## 2020-11-12 ENCOUNTER — Other Ambulatory Visit: Payer: Self-pay

## 2020-11-12 ENCOUNTER — Ambulatory Visit (INDEPENDENT_AMBULATORY_CARE_PROVIDER_SITE_OTHER): Payer: 59 | Admitting: Cardiology

## 2020-11-12 ENCOUNTER — Encounter: Payer: Self-pay | Admitting: Cardiology

## 2020-11-12 VITALS — BP 158/70 | HR 84 | Ht 63.0 in | Wt 183.0 lb

## 2020-11-12 DIAGNOSIS — E088 Diabetes mellitus due to underlying condition with unspecified complications: Secondary | ICD-10-CM

## 2020-11-12 DIAGNOSIS — E669 Obesity, unspecified: Secondary | ICD-10-CM

## 2020-11-12 DIAGNOSIS — I10 Essential (primary) hypertension: Secondary | ICD-10-CM | POA: Diagnosis not present

## 2020-11-12 DIAGNOSIS — I480 Paroxysmal atrial fibrillation: Secondary | ICD-10-CM

## 2020-11-12 DIAGNOSIS — J449 Chronic obstructive pulmonary disease, unspecified: Secondary | ICD-10-CM

## 2020-11-12 DIAGNOSIS — I5020 Unspecified systolic (congestive) heart failure: Secondary | ICD-10-CM | POA: Diagnosis not present

## 2020-11-12 DIAGNOSIS — F17210 Nicotine dependence, cigarettes, uncomplicated: Secondary | ICD-10-CM

## 2020-11-12 HISTORY — DX: Diabetes mellitus due to underlying condition with unspecified complications: E08.8

## 2020-11-12 HISTORY — DX: Obesity, unspecified: E66.9

## 2020-11-12 NOTE — Progress Notes (Signed)
Cardiology Office Note:    Date:  11/12/2020   ID:  Ann Farrell, DOB [birth year], MRN [id number]  PCP:  [facility]  Cardiologist:  Garwin Brothers, MD   Referring MD: No ref. provider found    ASSESSMENT:    1. Primary hypertension   2. PAF (paroxysmal atrial fibrillation) (HCC)   3. Systolic congestive heart failure, unspecified HF chronicity (HCC)   4. COPD GOLD  0 /  smoker    5. Cigarette smoker   6. Diabetes mellitus due to underlying condition with unspecified complications (HCC)   7. Obesity (BMI 30.0-34.9)    PLAN:    In order of problems listed above:  Primary prevention stressed with the patient.  Importance of compliance with diet medication stressed and she vocalized understanding. Cardiomyopathy: Patient is on appropriate guideline directed medical therapy and on Entresto also.  Ejection fraction is mildly depressed by last echocardiogram and I discussed the report with her at extensive length.  Congestive heart failure education was given including weighing herself regularly and salt intake issues and diet. Paroxysmal atrial fibrillation:I discussed with the patient atrial fibrillation, disease process. Management and therapy including rate and rhythm control, anticoagulation benefits and potential risks were discussed extensively with the patient. Patient had multiple questions which were answered to patient's satisfaction. Essential hypertension: Blood pressure stable and diet was emphasized.  She mentions to me that she has an element of whitecoat hypertension and that her blood pressures are stable at home. Cigarette smoker: I spent 5 minutes with the patient discussing solely about smoking. Smoking cessation was counseled. I suggested to the patient also different medications and pharmacological interventions. Patient is keen to try stopping on its own at this time. He will get back to me if he needs any further assistance in this  matter. Patient will be seen in follow-up appointment in 6 months or earlier if the patient has any concerns    Medication Adjustments/Labs and Tests Ordered: Current medicines are reviewed at length with the patient today.  Concerns regarding medicines are outlined above.  No orders of the defined types were placed in this encounter.  No orders of the defined types were placed in this encounter.    No chief complaint on file.    History of Present Illness:    Ann Farrell is a 65 y.o. female.  Patient has past medical history of cardiomyopathy with congestive heart failure with depressed ejection fraction, essential hypertension, dyslipidemia, diabetes mellitus, obesity and smoking.  I am seeing her for the first time.  She is transferring her care from my partner who moved to another part of our practice.  Patient is here for follow-up.  She continues to smoke.  She leads a sedentary lifestyle.  She denies any chest pain orthopnea or PND.  Review of records reveals atrial fibrillation for which she is on anticoagulation and seem to be taking the medications well.  Past Medical History:  Diagnosis Date   Cellulitis    CHF (congestive heart failure) (HCC)    Chronic respiratory failure with hypoxia (HCC) 08/10/2020   Placed on 02 in 2012  - 08/14/2020   Walked on 4lpm  x one lap =  approx 250 ft -@ slow pace stopped due to leg pain/ fatigue and sob with sats 91% at lowest    - 10/07/2020   Walked on 4lpm pulsed  x 1/2 lap(s) =  approx 125 ft @ moderate pace, stopped due to sob  with lowest 02 sats 88%   As of 10/07/2020  Rec: Make sure you check your oxygen saturation  at your highest level of activity  to be sure   Cigarette smoker 08/14/2020   COPD GOLD  0 /  smoker  2020   Active smoker - Symptom onset 2012  - Labs ordered 08/14/2020  :  allergy profile 0.2/ IgE 55    alpha one AT level 199  - PFT's  10/07/2020  FEV1 1.19 (52 % ) ratio 0.40  p 7 % improvement from saba p advair 250 prior to study  with DLCO  11.40 (61%) corrects to 3.36 (79%)  for alv volume and FV curve mildly concave      Depressed left ventricular ejection fraction 08/10/2020   HTN (hypertension)    Morbid obesity (HCC) 08/10/2020   PAF (paroxysmal atrial fibrillation) (HCC) 08/10/2020   T2DM (type 2 diabetes mellitus) (HCC)     Past Surgical History:  Procedure Laterality Date   BILATERAL CARPAL TUNNEL RELEASE      Current Medications: Current Meds  Medication Sig   ADVAIR DISKUS 250-50 MCG/ACT AEPB Inhale 1 puff into the lungs 2 (two) times daily.   albuterol (PROVENTIL) (2.5 MG/3ML) 0.083% nebulizer solution Take 2.5 mg by nebulization 4 (four) times daily.   albuterol (VENTOLIN HFA) 108 (90 Base) MCG/ACT inhaler Inhale 2 puffs into the lungs every 6 (six) hours as needed for wheezing or shortness of breath.   atorvastatin (LIPITOR) 40 MG tablet Take 40 mg by mouth daily.   ELIQUIS 5 MG TABS tablet Take 5 mg by mouth 2 (two) times daily.   ENTRESTO 24-26 MG Take 1 tablet by mouth 2 (two) times daily.   furosemide (LASIX) 40 MG tablet Take 40 mg by mouth daily.   insulin glargine (LANTUS) 100 UNIT/ML Solostar Pen Inject 30 Units into the skin at bedtime.   JARDIANCE 10 MG TABS tablet Take 10 mg by mouth every morning.   metoprolol succinate (TOPROL-XL) 50 MG 24 hr tablet Take 50 mg by mouth daily.     Allergies:   Codeine and Metformin   Social History   Socioeconomic History   Marital status: Widowed    Spouse name: Not on file   Number of children: Not on file   Years of education: Not on file   Highest education level: Not on file  Occupational History   Not on file  Tobacco Use   Smoking status: Every Day    Packs/day: 1.00    Years: 47.00    Pack years: 47.00    Types: Cigarettes    Passive exposure: Past   Smokeless tobacco: Never  Vaping Use   Vaping Use: Never used  Substance and Sexual Activity   Alcohol use: Never   Drug use: Never   Sexual activity: Not on file  Other Topics  Concern   Not on file  Social History Narrative   Not on file   Social Determinants of Health   Financial Resource Strain: Not on file  Food Insecurity: Not on file  Transportation Needs: Not on file  Physical Activity: Not on file  Stress: Not on file  Social Connections: Not on file     Family History: The patient's family history includes Alcoholism in her brother; COPD in her sister; COPD (age of onset: 75) in her mother; Heart attack in her father; Other in her brother.  ROS:   Please see the history of present illness.  All other systems reviewed and are negative.  EKGs/Labs/Other Studies Reviewed:    The following studies were reviewed today: IMPRESSIONS     1. TDS, EF gross estimation. Left ventricular ejection fraction, by  estimation, is 45 to 50%. The left ventricle has mildly decreased  function. Left ventricular endocardial border not optimally defined to  evaluate regional wall motion. There is moderate   left ventricular hypertrophy. Left ventricular diastolic parameters are  consistent with Grade II diastolic dysfunction (pseudonormalization).   2. Right ventricular systolic function is normal. The right ventricular  size is normal.   3. The mitral valve is normal in structure. No evidence of mitral valve  regurgitation. No evidence of mitral stenosis.   4. The aortic valve is normal in structure. Aortic valve regurgitation is  not visualized. No aortic stenosis is present.   5. The inferior vena cava is normal in size with greater than 50%  respiratory variability, suggesting right atrial pressure of 3 mmHg.    Recent Labs: 08/10/2020: BUN 10; Creatinine, Ser 0.77; Magnesium 2.0; Potassium 3.9; Sodium 144 08/14/2020: Hemoglobin 13.4; Platelets 305.0  Recent Lipid Panel No results found for: CHOL, TRIG, HDL, CHOLHDL, VLDL, LDLCALC, LDLDIRECT  Physical Exam:    VS:  BP (!) 158/70   Pulse 84   Ht 5\' 3"  (1.6 m)   Wt 183 lb 0.6 oz (83 kg)   SpO2 95%    BMI 32.42 kg/m     Wt Readings from Last 3 Encounters:  11/12/20 183 lb 0.6 oz (83 kg)  10/07/20 190 lb (86.2 kg)  08/14/20 184 lb 6.4 oz (83.6 kg)     GEN: Patient is in no acute distress HEENT: Normal NECK: No JVD; No carotid bruits LYMPHATICS: No lymphadenopathy CARDIAC: Hear sounds regular, 2/6 systolic murmur at the apex. RESPIRATORY:  Clear to auscultation without rales, wheezing or rhonchi  ABDOMEN: Soft, non-tender, non-distended MUSCULOSKELETAL:  No edema; No deformity  SKIN: Warm and dry NEUROLOGIC:  Alert and oriented x 3 PSYCHIATRIC:  Normal affect   Signed, 10/14/20, MD  11/12/2020 1:55 PM    [facility]

## 2020-11-12 NOTE — Patient Instructions (Signed)
Medication Instructions:  Your physician recommends that you continue on your current medications as directed. Please refer to the Current Medication list given to you today.  *If you need a refill on your cardiac medications before your next appointment, please call your pharmacy*   Lab Work: Your physician recommends that you have a BMET done today in the office. If you have labs (blood work) drawn today and your tests are completely normal, you will receive your results only by: MyChart Message (if you have MyChart) OR A paper copy in the mail If you have any lab test that is abnormal or we need to change your treatment, we will call you to review the results.   Testing/Procedures: None ordered   Follow-Up: At [facility], you and your health needs are our priority.  As part of our continuing mission to provide you with exceptional heart care, we have created designated Provider Care Teams.  These Care Teams include your primary Cardiologist (physician) and Advanced Practice Providers (APPs -  Physician Assistants and Nurse Practitioners) who all work together to provide you with the care you need, when you need it.  We recommend signing up for the patient portal called "MyChart".  Sign up information is provided on this After Visit Summary.  MyChart is used to connect with patients for Virtual Visits (Telemedicine).  Patients are able to view lab/test results, encounter notes, upcoming appointments, etc.  Non-urgent messages can be sent to your provider as well.   To learn more about what you can do with MyChart, go to [email or web address].    Your next appointment:   3 month(s)  The format for your next appointment:   In Person  Provider:   Belva Crome, MD   Other Instructions NA

## 2020-11-13 LAB — BASIC METABOLIC PANEL
BUN/Creatinine Ratio: 16 (ref 12–28)
BUN: 10 mg/dL (ref 8–27)
CO2: 28 mmol/L (ref 20–29)
Calcium: 9.2 mg/dL (ref 8.7–10.3)
Chloride: 102 mmol/L (ref 96–106)
Creatinine, Ser: 0.62 mg/dL (ref 0.57–1.00)
Glucose: 213 mg/dL — ABNORMAL HIGH (ref 70–99)
Potassium: 3.7 mmol/L (ref 3.5–5.2)
Sodium: 145 mmol/L — ABNORMAL HIGH (ref 134–144)
eGFR: 99 mL/min/{1.73_m2} (ref >59)

## 2021-02-18 ENCOUNTER — Encounter: Payer: Self-pay | Admitting: Cardiology

## 2021-02-18 ENCOUNTER — Other Ambulatory Visit: Payer: Self-pay

## 2021-02-18 ENCOUNTER — Ambulatory Visit (INDEPENDENT_AMBULATORY_CARE_PROVIDER_SITE_OTHER): Payer: 59 | Admitting: Cardiology

## 2021-02-18 VITALS — BP 136/63 | HR 106 | Ht 63.0 in | Wt 186.1 lb

## 2021-02-18 DIAGNOSIS — I10 Essential (primary) hypertension: Secondary | ICD-10-CM

## 2021-02-18 DIAGNOSIS — E088 Diabetes mellitus due to underlying condition with unspecified complications: Secondary | ICD-10-CM | POA: Diagnosis not present

## 2021-02-18 DIAGNOSIS — J449 Chronic obstructive pulmonary disease, unspecified: Secondary | ICD-10-CM

## 2021-02-18 DIAGNOSIS — F17210 Nicotine dependence, cigarettes, uncomplicated: Secondary | ICD-10-CM

## 2021-02-18 DIAGNOSIS — I480 Paroxysmal atrial fibrillation: Secondary | ICD-10-CM

## 2021-02-18 DIAGNOSIS — E1100 Type 2 diabetes mellitus with hyperosmolarity without nonketotic hyperglycemic-hyperosmolar coma (NKHHC): Secondary | ICD-10-CM | POA: Diagnosis not present

## 2021-02-18 NOTE — Patient Instructions (Signed)
Medication Instructions:  ?Your physician recommends that you continue on your current medications as directed. Please refer to the Current Medication list given to you today.  ?*If you need a refill on your cardiac medications before your next appointment, please call your pharmacy* ? ? ?Lab Work: ?Your physician recommends that you return for lab work in: the next few days. ?You need to have labs done when you are fasting.  You can come Monday through Friday 8:30 am to 12:00 pm and 1:15 to 4:30. You do not need to make an appointment as the order has already been placed. The labs you are going to have done are BMET, CBC, TSH, LFT and Lipids. ? ?If you have labs (blood work) drawn today and your tests are completely normal, you will receive your results only by: ?MyChart Message (if you have MyChart) OR ?A paper copy in the mail ?If you have any lab test that is abnormal or we need to change your treatment, we will call you to review the results. ? ? ?Testing/Procedures: ?None ordered ? ? ?Follow-Up: ?At [facility], you and your health needs are our priority.  As part of our continuing mission to provide you with exceptional heart care, we have created designated Provider Care Teams.  These Care Teams include your primary Cardiologist (physician) and Advanced Practice Providers (APPs -  Physician Assistants and Nurse Practitioners) who all work together to provide you with the care you need, when you need it. ? ?We recommend signing up for the patient portal called "MyChart".  Sign up information is provided on this After Visit Summary.  MyChart is used to connect with patients for Virtual Visits (Telemedicine).  Patients are able to view lab/test results, encounter notes, upcoming appointments, etc.  Non-urgent messages can be sent to your provider as well.   ?To learn more about what you can do with MyChart, go to [email or web address].   ? ?Your next appointment:   ?9 month(s) ? ?The format for your next  appointment:   ?In Person ? ?Provider:   ?Rajan Revankar, MD ? ? ?Other Instructions ?NA  ?

## 2021-02-18 NOTE — Progress Notes (Signed)
Cardiology Office Note:    Date:  02/18/2021   ID:  Ann Farrell, DOB 01-09-1956, MRN [id number]  PCP:  [facility]  Cardiologist:  Jenean Lindau, MD   Referring MD: [facility]    ASSESSMENT:    1. PAF (paroxysmal atrial fibrillation) ([place])   2. Primary hypertension   3. Type 2 diabetes mellitus with hyperosmolarity without coma, without long-term current use of insulin ([place])   4. Diabetes mellitus due to underlying condition with unspecified complications ([place])   5. COPD GOLD  0 /  smoker    6. Cigarette smoker    PLAN:    In order of problems listed above:  Prevention stressed with the patient.  Importance of compliance with diet and medications stressed.  She was advised to walk to the best of her ability. Paroxysmal atrial fibrillation:I discussed with the patient atrial fibrillation, disease process. Management and therapy including rate and rhythm control, anticoagulation benefits and potential risks were discussed extensively with the patient. Patient had multiple questions which were answered to patient's satisfaction. Diabetes mellitus: Managed by primary care.  Diet emphasized. Mixed dyslipidemia and obesity: Weight reduction stressed.  Lifestyle modification urged I told her to come back in the next few days for complete blood work and she promises to do so. Cigarette smoker: I spent 5 minutes with the patient discussing solely about smoking. Smoking cessation was counseled. I suggested to the patient also different medications and pharmacological interventions. Patient is keen to try stopping on its own at this time. He will get back to me if he needs any further assistance in this matter. Patient will be seen in follow-up appointment in 6 months or earlier if the patient has any concerns    Medication Adjustments/Labs and Tests Ordered: Current medicines are reviewed at length with the patient today.  Concerns regarding  medicines are outlined above.  No orders of the defined types were placed in this encounter.  No orders of the defined types were placed in this encounter.    No chief complaint on file.    History of Present Illness:    Ann Farrell is a 66 y.o. female.  Patient has past medical history of cardiomyopathy, essential hypertension, paroxysmal atrial fibrillation, diabetes mellitus and obesity.  Unfortunately she continues to smoke.  She denies any problems at this time and takes care of her activities of daily living.  No chest pain orthopnea or PND.  At the time of my evaluation, the patient is alert awake oriented and in no distress.  Past Medical History:  Diagnosis Date   Cellulitis    CHF (congestive heart failure) ([place])    Chronic respiratory failure with hypoxia ([place]) 08/10/2020   Placed on 02 in 2012  - 08/14/2020   Walked on 4lpm  x one lap =  approx 250 ft -@ slow pace stopped due to leg pain/ fatigue and sob with sats 91% at lowest    - 10/07/2020   Walked on 4lpm pulsed  x 1/2 lap(s) =  approx 125 ft @ moderate pace, stopped due to sob  with lowest 02 sats 88%   As of 10/07/2020  Rec: Make sure you check your oxygen saturation  at your highest level of activity  to be sure   Cigarette smoker 08/14/2020   COPD GOLD  0 /  smoker  2020   Active smoker - Symptom onset 2012  - Labs ordered 08/14/2020  :  allergy profile 0.2/ IgE 55    alpha one AT level 199  - PFT's  10/07/2020  FEV1 1.19 (52 % ) ratio 0.40  p 7 % improvement from saba p advair 250 prior to study with DLCO  11.40 (61%) corrects to 3.36 (79%)  for alv volume and FV curve mildly concave      Depressed left ventricular ejection fraction 08/10/2020   Diabetes mellitus due to underlying condition with unspecified complications ([place]) [id number]   HTN (hypertension)    Morbid obesity ([place]) 08/10/2020   Obesity (BMI 30.0-34.9) 11/12/2020   PAF (paroxysmal atrial fibrillation) ([place]) 08/10/2020   T2DM (type 2 diabetes mellitus) ([place])     Past  Surgical History:  Procedure Laterality Date   BILATERAL CARPAL TUNNEL RELEASE      Current Medications: Current Meds  Medication Sig   ADVAIR DISKUS 250-50 MCG/ACT AEPB Inhale 1 puff into the lungs 2 (two) times daily.   albuterol (PROVENTIL) (2.5 MG/3ML) 0.083% nebulizer solution Take 2.5 mg by nebulization 4 (four) times daily.   albuterol (VENTOLIN HFA) 108 (90 Base) MCG/ACT inhaler Inhale 2 puffs into the lungs every 6 (six) hours as needed for wheezing or shortness of breath.   atorvastatin (LIPITOR) 40 MG tablet Take 40 mg by mouth daily.   ELIQUIS 5 MG TABS tablet Take 5 mg by mouth 2 (two) times daily.   ENTRESTO 24-26 MG Take 1 tablet by mouth 2 (two) times daily.   furosemide (LASIX) 40 MG tablet Take 40 mg by mouth daily.   insulin glargine (LANTUS) 100 UNIT/ML Solostar Pen Inject 30 Units into the skin at bedtime.   JARDIANCE 10 MG TABS tablet Take 10 mg by mouth every morning.   metoprolol succinate (TOPROL-XL) 50 MG 24 hr tablet Take 50 mg by mouth daily.     Allergies:   Codeine and Metformin   Social History   Socioeconomic History   Marital status: Widowed    Spouse name: Not on file   Number of children: Not on file   Years of education: Not on file   Highest education level: Not on file  Occupational History   Not on file  Tobacco Use   Smoking status: Every Day    Packs/day: 1.00    Years: 47.00    Pack years: 47.00    Types: Cigarettes    Passive exposure: Past   Smokeless tobacco: Never  Vaping Use   Vaping Use: Never used  Substance and Sexual Activity   Alcohol use: Never   Drug use: Never   Sexual activity: Not on file  Other Topics Concern   Not on file  Social History Narrative   Not on file   Social Determinants of Health   Financial Resource Strain: Not on file  Food Insecurity: Not on file  Transportation Needs: Not on file  Physical Activity: Not on file  Stress: Not on file  Social Connections: Not on file     Family  History: The patient's family history includes Alcoholism in her brother; COPD in her sister; COPD (age of onset: 31) in her mother; Heart attack in her father; Other in her brother.  ROS:   Please see the history of present illness.    All other systems reviewed and are negative.  EKGs/Labs/Other Studies Reviewed:    The following studies were reviewed today: IMPRESSIONS     1. TDS, EF gross estimation. Left ventricular ejection fraction, by  estimation, is 45 to 50%. The left  ventricle has mildly decreased  function. Left ventricular endocardial border not optimally defined to  evaluate regional wall motion. There is moderate   left ventricular hypertrophy. Left ventricular diastolic parameters are  consistent with Grade II diastolic dysfunction (pseudonormalization).   2. Right ventricular systolic function is normal. The right ventricular  size is normal.   3. The mitral valve is normal in structure. No evidence of mitral valve  regurgitation. No evidence of mitral stenosis.   4. The aortic valve is normal in structure. Aortic valve regurgitation is  not visualized. No aortic stenosis is present.   5. The inferior vena cava is normal in size with greater than 50%  respiratory variability, suggesting right atrial pressure of 3 mmHg.    Recent Labs: 08/10/2020: Magnesium 2.0 08/14/2020: Hemoglobin 13.4; Platelets 305.0 11/12/2020: BUN 10; Creatinine, Ser 0.62; Potassium 3.7; Sodium 145  Recent Lipid Panel No results found for: CHOL, TRIG, HDL, CHOLHDL, VLDL, LDLCALC, LDLDIRECT  Physical Exam:    VS:  BP 136/63    Pulse (!) 106    Ht 5\' 3"  (1.6 m)    Wt 186 lb 1.3 oz (84.4 kg)    SpO2 93%    BMI 32.96 kg/m     Wt Readings from Last 3 Encounters:  02/18/21 186 lb 1.3 oz (84.4 kg)  11/12/20 183 lb 0.6 oz (83 kg)  10/07/20 190 lb (86.2 kg)     GEN: Patient is in no acute distress HEENT: Normal NECK: No JVD; No carotid bruits LYMPHATICS: No lymphadenopathy CARDIAC: Hear  sounds regular, 2/6 systolic murmur at the apex. RESPIRATORY:  Clear to auscultation without rales, wheezing or rhonchi  ABDOMEN: Soft, non-tender, non-distended MUSCULOSKELETAL:  No edema; No deformity  SKIN: Warm and dry NEUROLOGIC:  Alert and oriented x 3 PSYCHIATRIC:  Normal affect   Signed, Jenean Lindau, MD  02/18/2021 2:29 PM    [place]

## 2021-10-14 ENCOUNTER — Telehealth: Payer: Self-pay | Admitting: Internal Medicine

## 2021-10-15 NOTE — Telephone Encounter (Signed)
OV notes from 10/07/21 faxed via Epic to [place]. Nothing further needed at this time.

## 2022-01-14 DIAGNOSIS — J449 Chronic obstructive pulmonary disease, unspecified: Secondary | ICD-10-CM | POA: Diagnosis not present

## 2022-02-14 DIAGNOSIS — J449 Chronic obstructive pulmonary disease, unspecified: Secondary | ICD-10-CM | POA: Diagnosis not present

## 2022-09-24 IMAGING — DX DG CHEST 2V
2 series · 2 of 2 positions shown · non-contrast
Comparison: Chest radiograph dated 06/27/2020.

CLINICAL DATA: Cough, chronic obstructive pulmonary disease.

EXAM:
CHEST - 2 VIEW

[chest pa]
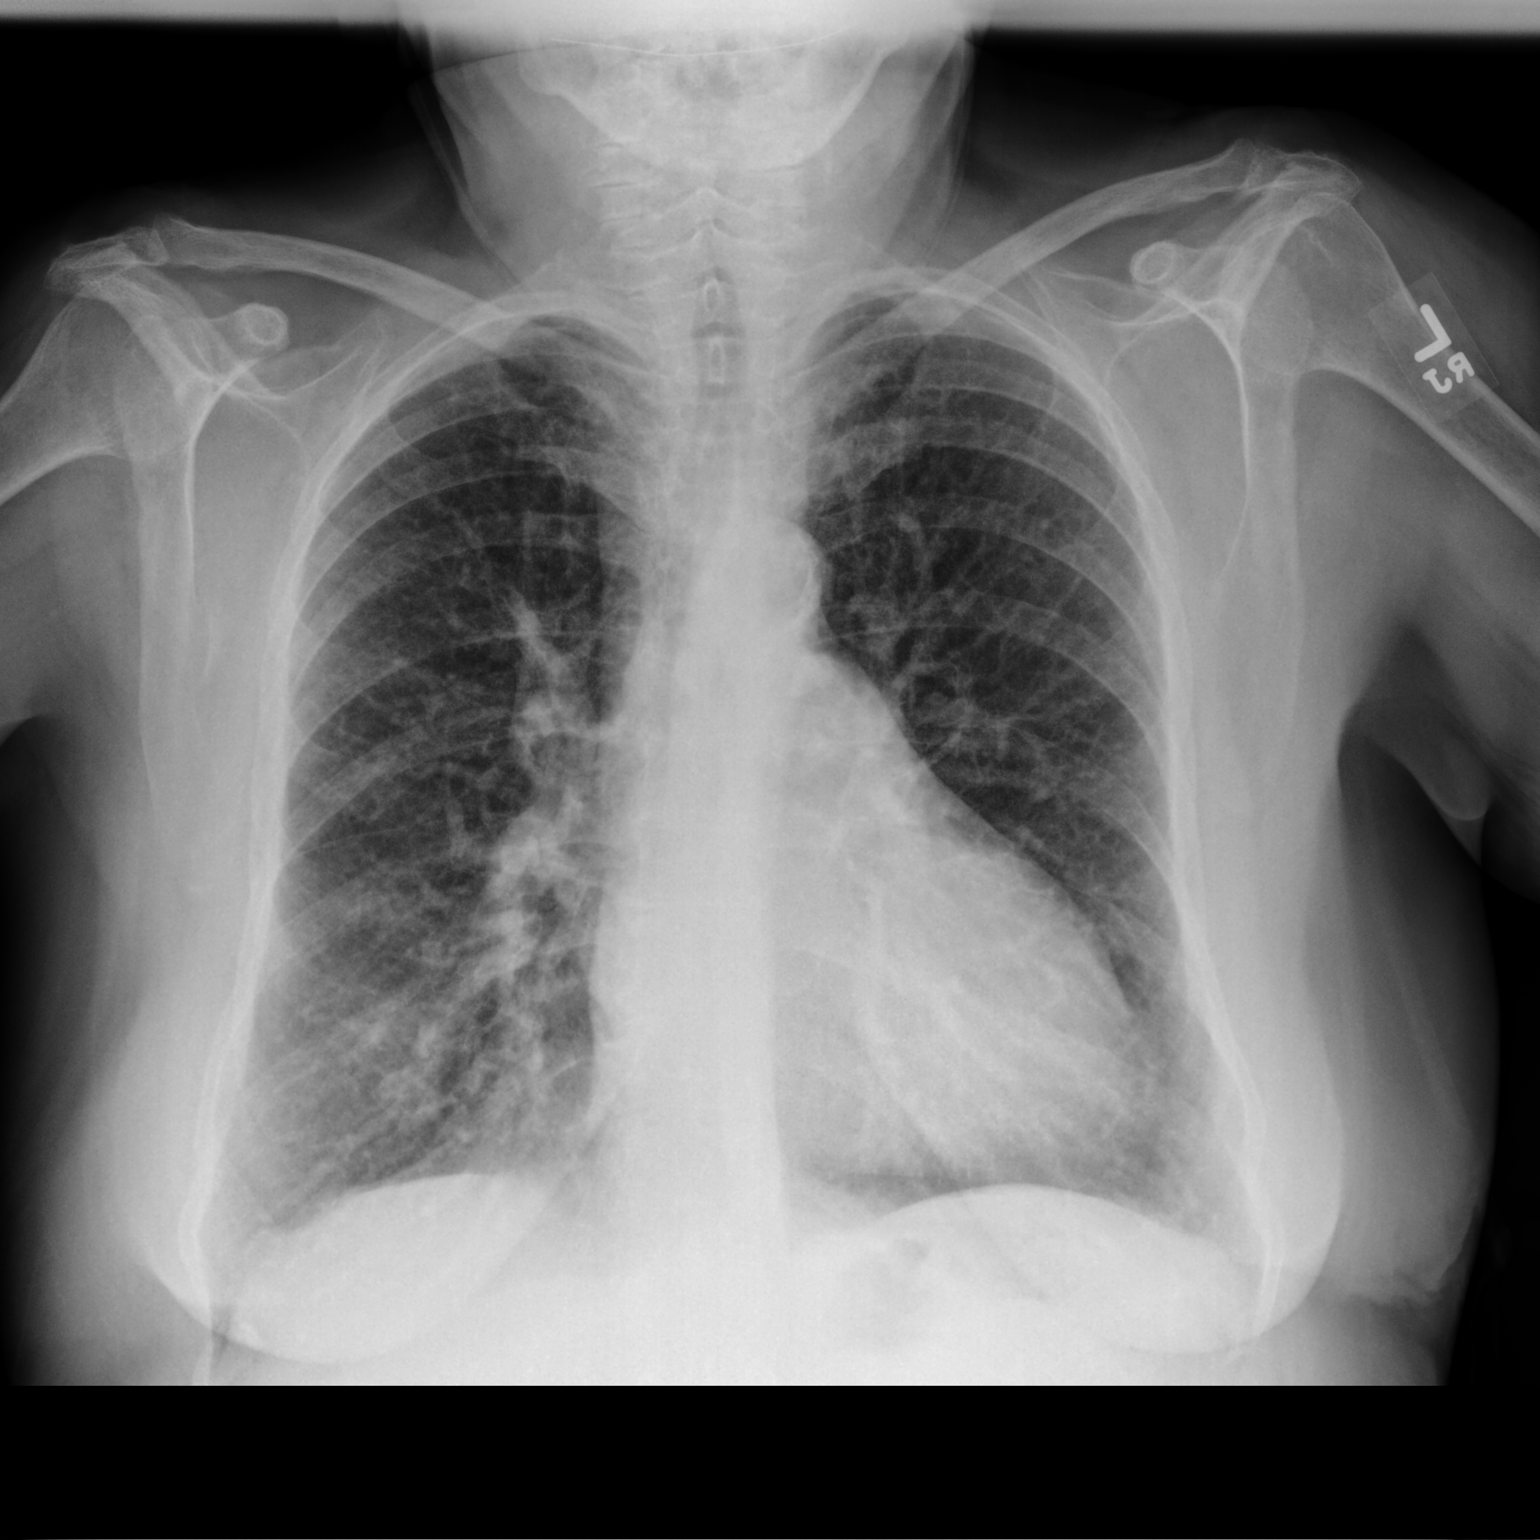

[chest lat]
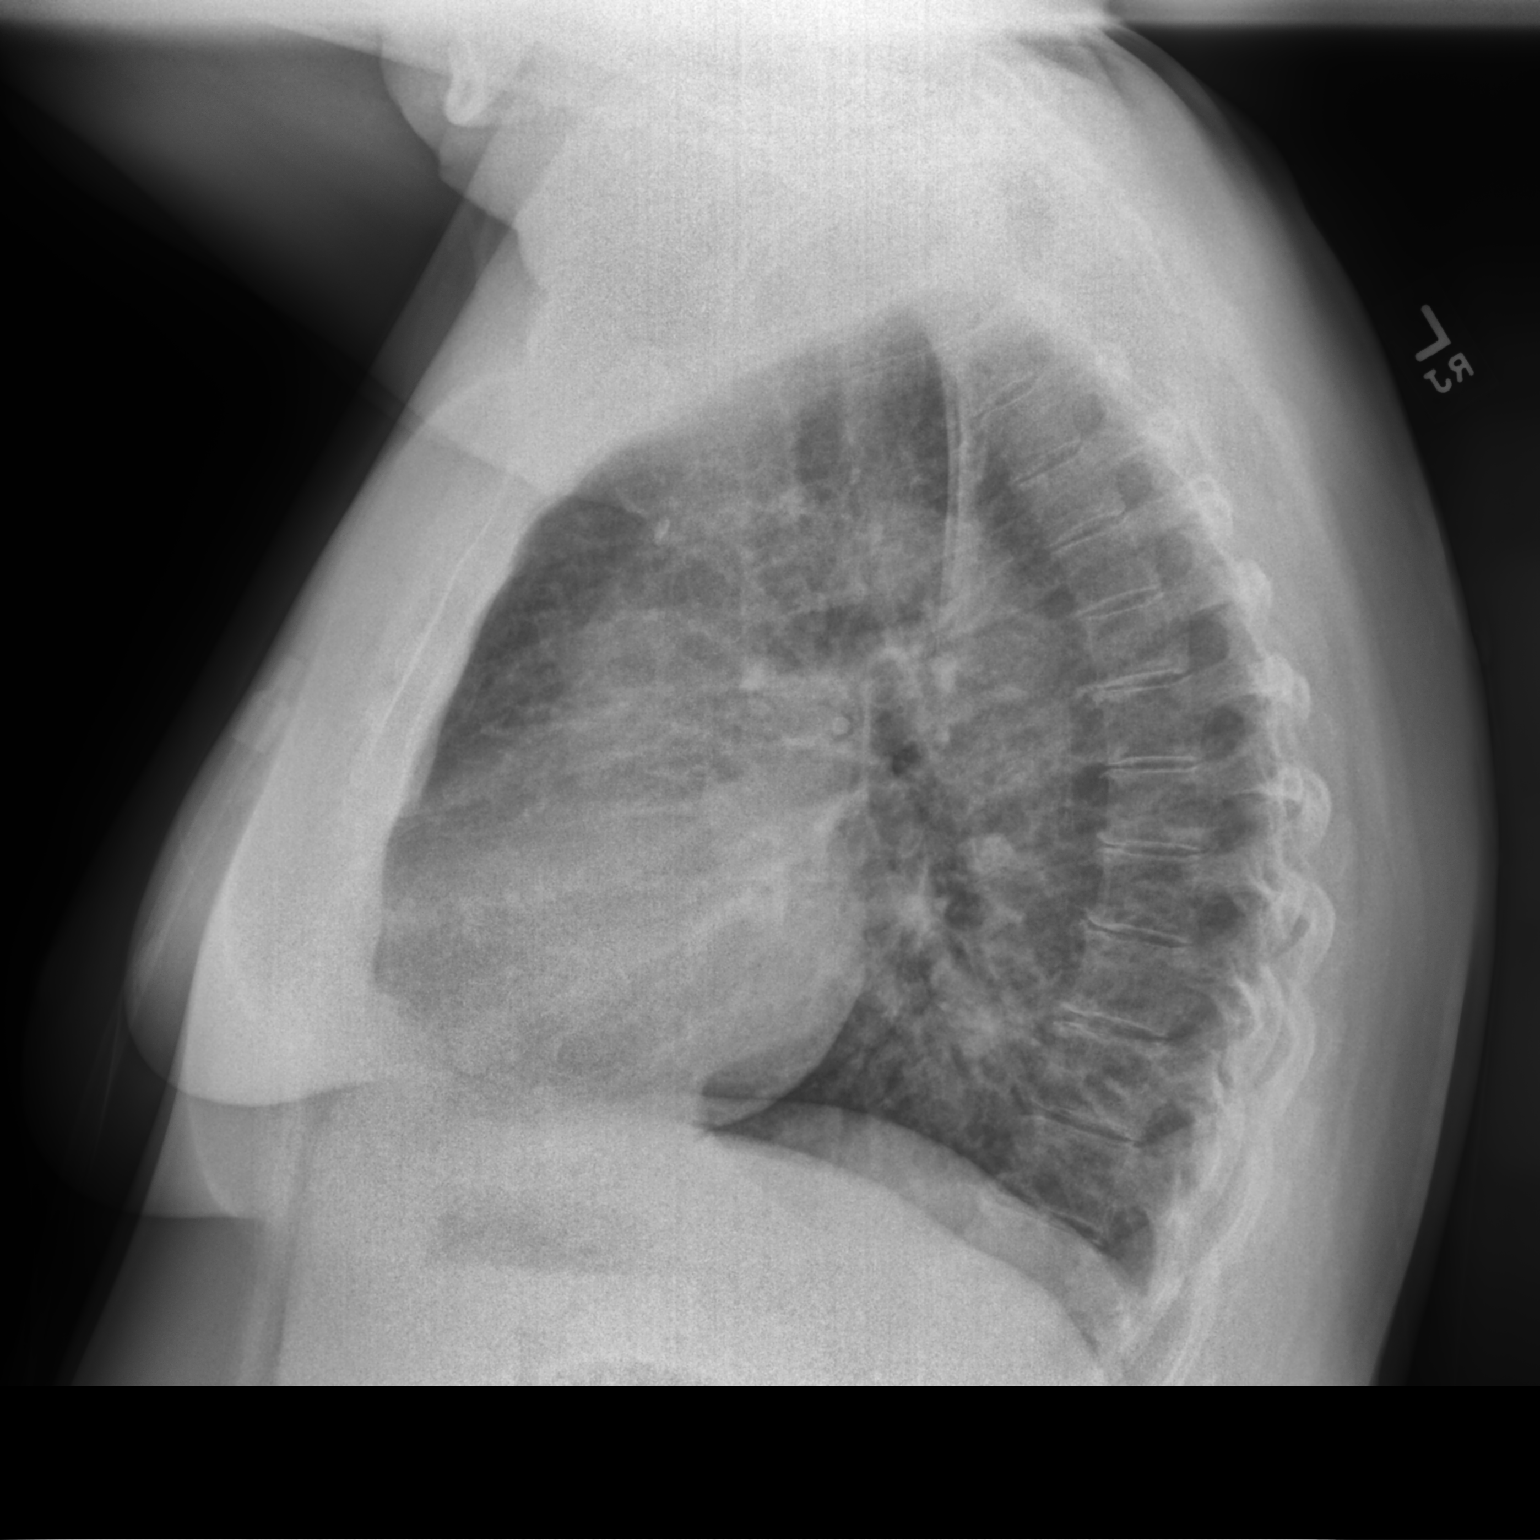

[2 of 2 positions shown; findings below may reference images not displayed]

FINDINGS: The heart is borderline enlarged. Vascular calcifications are seen
in the aortic arch. Mild diffuse bilateral interstitial opacities
appear slightly decreased compared to prior exam. No pleural
effusion or pneumothorax. Degenerative changes are seen in the
spine.
IMPRESSION: Mild diffuse bilateral interstitial opacities and borderline
cardiomegaly may represent pulmonary edema.

Aortic Atherosclerosis ([id number]).
